# Patient Record
Sex: Male | Born: 1950 | Race: Black or African American | Hispanic: No | Marital: Single | Smoking: Former smoker
Health system: Southern US, Community
[De-identification: ages and names within clinical notes are randomized; demographics above are authoritative.]

## PROBLEM LIST (undated history)

## (undated) DIAGNOSIS — I480 Paroxysmal atrial fibrillation: Secondary | ICD-10-CM

## (undated) DIAGNOSIS — I2510 Atherosclerotic heart disease of native coronary artery without angina pectoris: Secondary | ICD-10-CM

## (undated) DIAGNOSIS — I739 Peripheral vascular disease, unspecified: Secondary | ICD-10-CM

## (undated) DIAGNOSIS — F0390 Unspecified dementia without behavioral disturbance: Secondary | ICD-10-CM

## (undated) DIAGNOSIS — R7303 Prediabetes: Secondary | ICD-10-CM

## (undated) DIAGNOSIS — T4145XA Adverse effect of unspecified anesthetic, initial encounter: Secondary | ICD-10-CM

## (undated) DIAGNOSIS — E785 Hyperlipidemia, unspecified: Secondary | ICD-10-CM

## (undated) DIAGNOSIS — I639 Cerebral infarction, unspecified: Secondary | ICD-10-CM

## (undated) DIAGNOSIS — I10 Essential (primary) hypertension: Secondary | ICD-10-CM

## (undated) DIAGNOSIS — F1020 Alcohol dependence, uncomplicated: Secondary | ICD-10-CM

## (undated) DIAGNOSIS — I509 Heart failure, unspecified: Secondary | ICD-10-CM

## (undated) DIAGNOSIS — Z89612 Acquired absence of left leg above knee: Secondary | ICD-10-CM

## (undated) DIAGNOSIS — Z9289 Personal history of other medical treatment: Secondary | ICD-10-CM

## (undated) DIAGNOSIS — I82409 Acute embolism and thrombosis of unspecified deep veins of unspecified lower extremity: Secondary | ICD-10-CM

## (undated) DIAGNOSIS — I6529 Occlusion and stenosis of unspecified carotid artery: Secondary | ICD-10-CM

## (undated) DIAGNOSIS — IMO0001 Reserved for inherently not codable concepts without codable children: Secondary | ICD-10-CM

## (undated) DIAGNOSIS — E7800 Pure hypercholesterolemia, unspecified: Secondary | ICD-10-CM

## (undated) DIAGNOSIS — H5440 Blindness, one eye, unspecified eye: Secondary | ICD-10-CM

## (undated) DIAGNOSIS — T8859XA Other complications of anesthesia, initial encounter: Secondary | ICD-10-CM

## (undated) DIAGNOSIS — Z89611 Acquired absence of right leg above knee: Secondary | ICD-10-CM

## (undated) DIAGNOSIS — I219 Acute myocardial infarction, unspecified: Secondary | ICD-10-CM

## (undated) DIAGNOSIS — E119 Type 2 diabetes mellitus without complications: Secondary | ICD-10-CM

## (undated) HISTORY — DX: Alcohol dependence, uncomplicated: F10.20

## (undated) HISTORY — DX: Hyperlipidemia, unspecified: E78.5

## (undated) HISTORY — DX: Paroxysmal atrial fibrillation: I48.0

## (undated) HISTORY — DX: Cerebral infarction, unspecified: I63.9

## (undated) HISTORY — DX: Unspecified dementia without behavioral disturbance: F03.90

## (undated) HISTORY — PX: OTHER SURGICAL HISTORY: SHX169

## (undated) HISTORY — DX: Essential (primary) hypertension: I10

## (undated) HISTORY — DX: Atherosclerotic heart disease of native coronary artery without angina pectoris: I25.10

## (undated) HISTORY — PX: CARDIAC CATHETERIZATION: SHX172

## (undated) HISTORY — PX: APPENDECTOMY: SHX54

## (undated) HISTORY — DX: Unspecified dementia, unspecified severity, without behavioral disturbance, psychotic disturbance, mood disturbance, and anxiety: F03.90

## (undated) HISTORY — DX: Occlusion and stenosis of unspecified carotid artery: I65.29

## (undated) HISTORY — DX: Acquired absence of right leg above knee: Z89.611

## (undated) HISTORY — PX: CORONARY ARTERY BYPASS GRAFT: SHX141

## (undated) HISTORY — PX: BACK SURGERY: SHX140

## (undated) HISTORY — DX: Acquired absence of left leg above knee: Z89.612

---
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
## 1999-05-05 HISTORY — PX: PERIPHERAL ARTERIAL STENT GRAFT: SHX2220

## 2004-11-27 ENCOUNTER — Ambulatory Visit: Payer: Self-pay | Admitting: Cardiology

## 2004-11-28 ENCOUNTER — Inpatient Hospital Stay (HOSPITAL_COMMUNITY): Admission: EM | Admit: 2004-11-28 | Discharge: 2004-11-30 | Payer: Self-pay | Admitting: Emergency Medicine

## 2005-02-03 ENCOUNTER — Emergency Department (HOSPITAL_COMMUNITY): Admission: AC | Admit: 2005-02-03 | Discharge: 2005-02-04 | Payer: Self-pay

## 2006-11-04 ENCOUNTER — Observation Stay (HOSPITAL_COMMUNITY): Admission: EM | Admit: 2006-11-04 | Discharge: 2006-11-06 | Payer: Self-pay | Admitting: Emergency Medicine

## 2006-11-05 ENCOUNTER — Ambulatory Visit: Payer: Self-pay | Admitting: Internal Medicine

## 2006-11-05 ENCOUNTER — Encounter: Payer: Self-pay | Admitting: Cardiovascular Disease

## 2006-11-05 ENCOUNTER — Ambulatory Visit: Payer: Self-pay | Admitting: Vascular Surgery

## 2008-01-27 ENCOUNTER — Emergency Department (HOSPITAL_COMMUNITY): Admission: EM | Admit: 2008-01-27 | Discharge: 2008-01-27 | Payer: Self-pay | Admitting: Emergency Medicine

## 2008-03-15 ENCOUNTER — Ambulatory Visit: Payer: Self-pay | Admitting: Vascular Surgery

## 2008-03-15 ENCOUNTER — Encounter (INDEPENDENT_AMBULATORY_CARE_PROVIDER_SITE_OTHER): Payer: Self-pay | Admitting: Emergency Medicine

## 2008-03-15 ENCOUNTER — Inpatient Hospital Stay (HOSPITAL_COMMUNITY): Admission: EM | Admit: 2008-03-15 | Discharge: 2008-03-17 | Payer: Self-pay | Admitting: Emergency Medicine

## 2008-03-26 DIAGNOSIS — I10 Essential (primary) hypertension: Secondary | ICD-10-CM | POA: Insufficient documentation

## 2008-03-26 DIAGNOSIS — N400 Enlarged prostate without lower urinary tract symptoms: Secondary | ICD-10-CM | POA: Insufficient documentation

## 2008-03-29 DIAGNOSIS — I739 Peripheral vascular disease, unspecified: Secondary | ICD-10-CM | POA: Insufficient documentation

## 2008-03-29 DIAGNOSIS — I2510 Atherosclerotic heart disease of native coronary artery without angina pectoris: Secondary | ICD-10-CM | POA: Insufficient documentation

## 2008-03-29 DIAGNOSIS — E785 Hyperlipidemia, unspecified: Secondary | ICD-10-CM

## 2008-03-29 DIAGNOSIS — I798 Other disorders of arteries, arterioles and capillaries in diseases classified elsewhere: Secondary | ICD-10-CM

## 2008-03-29 DIAGNOSIS — R079 Chest pain, unspecified: Secondary | ICD-10-CM | POA: Insufficient documentation

## 2008-05-19 ENCOUNTER — Ambulatory Visit: Payer: Self-pay | Admitting: Vascular Surgery

## 2008-08-06 ENCOUNTER — Emergency Department (HOSPITAL_COMMUNITY): Admission: EM | Admit: 2008-08-06 | Discharge: 2008-08-06 | Payer: Self-pay | Admitting: Emergency Medicine

## 2008-08-19 ENCOUNTER — Emergency Department (HOSPITAL_COMMUNITY): Admission: EM | Admit: 2008-08-19 | Discharge: 2008-08-19 | Payer: Self-pay | Admitting: Family Medicine

## 2008-09-08 ENCOUNTER — Ambulatory Visit: Payer: Self-pay | Admitting: Vascular Surgery

## 2008-09-10 ENCOUNTER — Ambulatory Visit (HOSPITAL_COMMUNITY): Admission: RE | Admit: 2008-09-10 | Discharge: 2008-09-10 | Payer: Self-pay | Admitting: Vascular Surgery

## 2008-09-10 ENCOUNTER — Ambulatory Visit: Payer: Self-pay | Admitting: Vascular Surgery

## 2008-09-14 ENCOUNTER — Inpatient Hospital Stay (HOSPITAL_COMMUNITY): Admission: AD | Admit: 2008-09-14 | Discharge: 2008-09-24 | Payer: Self-pay | Admitting: Vascular Surgery

## 2008-09-15 ENCOUNTER — Encounter: Payer: Self-pay | Admitting: Vascular Surgery

## 2008-09-17 ENCOUNTER — Encounter: Payer: Self-pay | Admitting: Vascular Surgery

## 2008-09-17 ENCOUNTER — Ambulatory Visit: Payer: Self-pay | Admitting: Vascular Surgery

## 2008-10-06 ENCOUNTER — Ambulatory Visit: Payer: Self-pay | Admitting: Vascular Surgery

## 2008-10-13 ENCOUNTER — Ambulatory Visit: Payer: Self-pay | Admitting: Vascular Surgery

## 2008-12-02 ENCOUNTER — Ambulatory Visit: Payer: Self-pay | Admitting: Vascular Surgery

## 2009-01-21 ENCOUNTER — Ambulatory Visit: Payer: Self-pay | Admitting: Vascular Surgery

## 2009-04-13 ENCOUNTER — Ambulatory Visit: Payer: Self-pay | Admitting: Vascular Surgery

## 2009-08-17 ENCOUNTER — Ambulatory Visit: Payer: Self-pay | Admitting: Cardiology

## 2009-08-17 ENCOUNTER — Encounter: Payer: Self-pay | Admitting: Emergency Medicine

## 2009-08-17 ENCOUNTER — Inpatient Hospital Stay (HOSPITAL_COMMUNITY): Admission: EM | Admit: 2009-08-17 | Discharge: 2009-08-31 | Payer: Self-pay | Admitting: Emergency Medicine

## 2009-08-18 ENCOUNTER — Ambulatory Visit: Payer: Self-pay | Admitting: Surgery

## 2009-08-18 ENCOUNTER — Encounter: Payer: Self-pay | Admitting: Thoracic Surgery (Cardiothoracic Vascular Surgery)

## 2009-08-18 ENCOUNTER — Encounter: Payer: Self-pay | Admitting: Cardiology

## 2009-09-27 ENCOUNTER — Ambulatory Visit: Payer: Self-pay | Admitting: Surgery

## 2009-09-27 ENCOUNTER — Encounter: Admission: RE | Admit: 2009-09-27 | Discharge: 2009-09-27 | Payer: Self-pay | Admitting: Surgery

## 2009-11-22 DIAGNOSIS — I2510 Atherosclerotic heart disease of native coronary artery without angina pectoris: Secondary | ICD-10-CM

## 2009-11-22 DIAGNOSIS — I10 Essential (primary) hypertension: Secondary | ICD-10-CM

## 2009-11-22 DIAGNOSIS — I739 Peripheral vascular disease, unspecified: Secondary | ICD-10-CM

## 2009-11-24 ENCOUNTER — Observation Stay (HOSPITAL_COMMUNITY): Admission: EM | Admit: 2009-11-24 | Discharge: 2009-11-29 | Payer: Self-pay | Admitting: Emergency Medicine

## 2009-11-24 ENCOUNTER — Ambulatory Visit: Payer: Self-pay | Admitting: Internal Medicine

## 2009-11-25 ENCOUNTER — Encounter: Payer: Self-pay | Admitting: Internal Medicine

## 2009-11-29 ENCOUNTER — Encounter: Payer: Self-pay | Admitting: Cardiology

## 2009-12-01 ENCOUNTER — Encounter (INDEPENDENT_AMBULATORY_CARE_PROVIDER_SITE_OTHER): Payer: Self-pay | Admitting: *Deleted

## 2010-09-24 ENCOUNTER — Encounter: Payer: Self-pay | Admitting: Surgery

## 2010-10-03 NOTE — Letter (Signed)
Summary: Appointment - Missed  [facility], [place]  [address] [place]   [place], [place] [address]   Phone: [phone]  Fax: [phone]     December 01, 2009 MRN: [id number]   [name] [place], [place]  [address]   Dear [name],  [facility] records indicate you missed your appointment on 11/30/2009 with Dr. Aundra Dubin. It is very important that we reach you to reschedule this appointment. We look forward to participating in your health care needs. Please contact us at the number listed above at your earliest convenience to reschedule this appointment.     Sincerely,   Darnell Level [facility] Scheduling Team

## 2010-10-03 NOTE — Miscellaneous (Signed)
  Clinical Lists Changes  Observations: Added new observation of RESULTS MISC:   MYOCARDIAL IMAGING WITH SPECT (REST AND PHARMACOLOGIC-STRESS)   GATED LEFT VENTRICULAR WALL MOTION STUDY   LEFT VENTRICULAR EJECTION FRACTION    Findings:    Spect:  There is a large fixed defect involving the lateral wall   extending into the anterior wall and inferior wall.  It extends   from the base to the apex.  No stress induced ischemia.    Wall motion:  Moderate hypokinesis of the inferior wall.   Relatively normal motion of the lateral wall despite the fixed   defect.    Ejection fraction:  46%.  End diastolic volume 76 ml.  End-systolic   volume 41 ml.    IMPRESSION:   Large fixed defect involving the lateral wall extending into the   anterior and inferior walls as described.    No stress induced ischemia.    Read By:  Marybelle Killings,  M.D.   Released By:  [name],  M.D. (11/25/2009 13:40) Added new observation of CXR RESULTS:    IMPRESSION:   Postop changes.  No acute chest finding    Read By:  Earl Gala.,  M.D.   Released By:  Earl Gala.,  M.D.  (11/24/2009 13:41)      MISC. Report  Procedure date:  11/25/2009  Findings:        MYOCARDIAL IMAGING WITH SPECT (REST AND PHARMACOLOGIC-STRESS)   GATED LEFT VENTRICULAR WALL MOTION STUDY   LEFT VENTRICULAR EJECTION FRACTION    Findings:    Spect:  There is a large fixed defect involving the lateral wall   extending into the anterior wall and inferior wall.  It extends   from the base to the apex.  No stress induced ischemia.    Wall motion:  Moderate hypokinesis of the inferior wall.   Relatively normal motion of the lateral wall despite the fixed   defect.    Ejection fraction:  46%.  End diastolic volume 76 ml.  End-systolic   volume 41 ml.    IMPRESSION:   Large fixed defect involving the lateral wall extending into the   anterior and inferior walls as described.    No stress induced  ischemia.    Read By:  Marybelle Killings,  M.D.   Released By:  [name],  M.D.  CXR  Procedure date:  11/24/2009  Findings:         IMPRESSION:   Postop changes.  No acute chest finding    Read By:  Earl Gala.,  M.D.   Released By:  Earl Gala.,  M.D.

## 2010-11-26 LAB — POCT CARDIAC MARKERS
CKMB, poc: 1.2 ng/mL (ref 1.0–8.0)
CKMB, poc: 1.3 ng/mL (ref 1.0–8.0)
Myoglobin, poc: 73.8 ng/mL (ref 12–200)
Myoglobin, poc: 89.8 ng/mL (ref 12–200)
Troponin i, poc: <0.05 ng/mL (ref 0.00–0.09)
Troponin i, poc: <0.05 ng/mL (ref 0.00–0.09)

## 2010-11-26 LAB — LIPID PANEL
Cholesterol: 158 mg/dL (ref 0–200)
HDL: 32 mg/dL — ABNORMAL LOW (ref >39)
LDL Cholesterol: 101 mg/dL — ABNORMAL HIGH (ref 0–99)
Total CHOL/HDL Ratio: 4.9 RATIO
Triglycerides: 125 mg/dL (ref <150)
VLDL: 25 mg/dL (ref 0–40)

## 2010-11-26 LAB — CARDIAC PANEL(CRET KIN+CKTOT+MB+TROPI)
CK, MB: 2.5 ng/mL (ref 0.3–4.0)
CK, MB: 2.6 ng/mL (ref 0.3–4.0)
CK, MB: 2.6 ng/mL (ref 0.3–4.0)
Relative Index: 1.8 (ref 0.0–2.5)
Relative Index: 1.9 (ref 0.0–2.5)
Relative Index: 1.9 (ref 0.0–2.5)
Total CK: 133 U/L (ref 7–232)
Total CK: 136 U/L (ref 7–232)
Total CK: 141 U/L (ref 7–232)
Troponin I: 0.02 ng/mL (ref 0.00–0.06)
Troponin I: 0.02 ng/mL (ref 0.00–0.06)
Troponin I: 0.05 ng/mL (ref 0.00–0.06)

## 2010-11-26 LAB — CBC
HCT: 40.6 % (ref 39.0–52.0)
HCT: 42.5 % (ref 39.0–52.0)
Hemoglobin: 13.3 g/dL (ref 13.0–17.0)
Hemoglobin: 13.8 g/dL (ref 13.0–17.0)
MCHC: 32.5 g/dL (ref 30.0–36.0)
MCHC: 32.8 g/dL (ref 30.0–36.0)
MCV: 81.7 fL (ref 78.0–100.0)
MCV: 82.3 fL (ref 78.0–100.0)
Platelets: 182 10*3/uL (ref 150–400)
Platelets: 193 10*3/uL (ref 150–400)
RBC: 4.97 MIL/uL (ref 4.22–5.81)
RBC: 5.16 MIL/uL (ref 4.22–5.81)
RDW: 15.2 % (ref 11.5–15.5)
RDW: 15.2 % (ref 11.5–15.5)
WBC: 6.2 10*3/uL (ref 4.0–10.5)
WBC: 6.2 10*3/uL (ref 4.0–10.5)

## 2010-11-26 LAB — URINALYSIS, ROUTINE W REFLEX MICROSCOPIC
Bilirubin Urine: NEGATIVE
Glucose, UA: NEGATIVE mg/dL
Hgb urine dipstick: NEGATIVE
Ketones, ur: NEGATIVE mg/dL
Nitrite: NEGATIVE
Protein, ur: NEGATIVE mg/dL
Specific Gravity, Urine: 1.024 (ref 1.005–1.030)
Urobilinogen, UA: 1 mg/dL (ref 0.0–1.0)
pH: 5 (ref 5.0–8.0)

## 2010-11-26 LAB — BASIC METABOLIC PANEL
BUN: 14 mg/dL (ref 6–23)
BUN: 14 mg/dL (ref 6–23)
BUN: 20 mg/dL (ref 6–23)
CO2: 25 mEq/L (ref 19–32)
CO2: 25 mEq/L (ref 19–32)
CO2: 27 mEq/L (ref 19–32)
Calcium: 8.7 mg/dL (ref 8.4–10.5)
Calcium: 8.9 mg/dL (ref 8.4–10.5)
Calcium: 9.1 mg/dL (ref 8.4–10.5)
Chloride: 107 mEq/L (ref 96–112)
Chloride: 109 mEq/L (ref 96–112)
Chloride: 110 mEq/L (ref 96–112)
Creatinine, Ser: 1.32 mg/dL (ref 0.4–1.5)
Creatinine, Ser: 1.41 mg/dL (ref 0.4–1.5)
Creatinine, Ser: 1.48 mg/dL (ref 0.4–1.5)
GFR calc Af Amer: 59 mL/min — ABNORMAL LOW (ref >60)
GFR calc Af Amer: >60 mL/min (ref >60)
GFR calc Af Amer: >60 mL/min (ref >60)
GFR calc non Af Amer: 49 mL/min — ABNORMAL LOW (ref >60)
GFR calc non Af Amer: 52 mL/min — ABNORMAL LOW (ref >60)
GFR calc non Af Amer: 56 mL/min — ABNORMAL LOW (ref >60)
Glucose, Bld: 89 mg/dL (ref 70–99)
Glucose, Bld: 97 mg/dL (ref 70–99)
Glucose, Bld: 98 mg/dL (ref 70–99)
Potassium: 3.9 mEq/L (ref 3.5–5.1)
Potassium: 4.2 mEq/L (ref 3.5–5.1)
Potassium: 4.3 mEq/L (ref 3.5–5.1)
Sodium: 138 mEq/L (ref 135–145)
Sodium: 139 mEq/L (ref 135–145)
Sodium: 140 mEq/L (ref 135–145)

## 2010-11-26 LAB — DIFFERENTIAL
Basophils Absolute: 0 10*3/uL (ref 0.0–0.1)
Basophils Absolute: 0 10*3/uL (ref 0.0–0.1)
Basophils Relative: 0 % (ref 0–1)
Basophils Relative: 0 % (ref 0–1)
Eosinophils Absolute: 0.1 10*3/uL (ref 0.0–0.7)
Eosinophils Absolute: 0.1 10*3/uL (ref 0.0–0.7)
Eosinophils Relative: 2 % (ref 0–5)
Eosinophils Relative: 2 % (ref 0–5)
Lymphocytes Relative: 50 % — ABNORMAL HIGH (ref 12–46)
Lymphocytes Relative: 52 % — ABNORMAL HIGH (ref 12–46)
Lymphs Abs: 3.1 10*3/uL (ref 0.7–4.0)
Lymphs Abs: 3.2 10*3/uL (ref 0.7–4.0)
Monocytes Absolute: 0.6 10*3/uL (ref 0.1–1.0)
Monocytes Absolute: 0.7 10*3/uL (ref 0.1–1.0)
Monocytes Relative: 10 % (ref 3–12)
Monocytes Relative: 11 % (ref 3–12)
Neutro Abs: 2.2 10*3/uL (ref 1.7–7.7)
Neutro Abs: 2.3 10*3/uL (ref 1.7–7.7)
Neutrophils Relative %: 35 % — ABNORMAL LOW (ref 43–77)
Neutrophils Relative %: 37 % — ABNORMAL LOW (ref 43–77)

## 2010-11-26 LAB — CK TOTAL AND CKMB (NOT AT ARMC)
CK, MB: 2.5 ng/mL (ref 0.3–4.0)
CK, MB: 2.6 ng/mL (ref 0.3–4.0)
Relative Index: 1.7 (ref 0.0–2.5)
Relative Index: 1.8 (ref 0.0–2.5)
Total CK: 137 U/L (ref 7–232)
Total CK: 150 U/L (ref 7–232)

## 2010-11-26 LAB — COMPREHENSIVE METABOLIC PANEL
ALT: 30 U/L (ref 0–53)
AST: 29 U/L (ref 0–37)
Albumin: 3.2 g/dL — ABNORMAL LOW (ref 3.5–5.2)
Alkaline Phosphatase: 65 U/L (ref 39–117)
BUN: 19 mg/dL (ref 6–23)
CO2: 27 mEq/L (ref 19–32)
Calcium: 9.2 mg/dL (ref 8.4–10.5)
Chloride: 107 mEq/L (ref 96–112)
Creatinine, Ser: 1.53 mg/dL — ABNORMAL HIGH (ref 0.4–1.5)
GFR calc Af Amer: 57 mL/min — ABNORMAL LOW (ref >60)
GFR calc non Af Amer: 47 mL/min — ABNORMAL LOW (ref >60)
Glucose, Bld: 96 mg/dL (ref 70–99)
Potassium: 4.3 mEq/L (ref 3.5–5.1)
Sodium: 139 mEq/L (ref 135–145)
Total Bilirubin: 0.3 mg/dL (ref 0.3–1.2)
Total Protein: 6.5 g/dL (ref 6.0–8.3)

## 2010-11-26 LAB — RAPID URINE DRUG SCREEN, HOSP PERFORMED
Amphetamines: NOT DETECTED
Barbiturates: NOT DETECTED
Benzodiazepines: NOT DETECTED
Cocaine: NOT DETECTED
Opiates: NOT DETECTED
Tetrahydrocannabinol: NOT DETECTED

## 2010-11-26 LAB — TROPONIN I
Troponin I: 0.02 ng/mL (ref 0.00–0.06)
Troponin I: 0.02 ng/mL (ref 0.00–0.06)

## 2010-11-26 LAB — PROTIME-INR
INR: 0.91 (ref 0.00–1.49)
Prothrombin Time: 12.2 seconds (ref 11.6–15.2)

## 2010-11-26 LAB — TSH: TSH: 3.446 u[IU]/mL (ref 0.350–4.500)

## 2010-11-26 LAB — HEMOGLOBIN A1C
Hgb A1c MFr Bld: 6.2 % — ABNORMAL HIGH (ref 4.6–6.1)
Mean Plasma Glucose: 131 mg/dL

## 2010-11-26 LAB — PHOSPHORUS: Phosphorus: 4.7 mg/dL — ABNORMAL HIGH (ref 2.3–4.6)

## 2010-11-26 LAB — MAGNESIUM: Magnesium: 1.9 mg/dL (ref 1.5–2.5)

## 2010-11-26 LAB — APTT: aPTT: 30 seconds (ref 24–37)

## 2010-12-04 LAB — GLUCOSE, CAPILLARY
Glucose-Capillary: 104 mg/dL — ABNORMAL HIGH (ref 70–99)
Glucose-Capillary: 104 mg/dL — ABNORMAL HIGH (ref 70–99)
Glucose-Capillary: 108 mg/dL — ABNORMAL HIGH (ref 70–99)
Glucose-Capillary: 112 mg/dL — ABNORMAL HIGH (ref 70–99)
Glucose-Capillary: 118 mg/dL — ABNORMAL HIGH (ref 70–99)
Glucose-Capillary: 120 mg/dL — ABNORMAL HIGH (ref 70–99)
Glucose-Capillary: 124 mg/dL — ABNORMAL HIGH (ref 70–99)
Glucose-Capillary: 130 mg/dL — ABNORMAL HIGH (ref 70–99)
Glucose-Capillary: 131 mg/dL — ABNORMAL HIGH (ref 70–99)
Glucose-Capillary: 93 mg/dL (ref 70–99)
Glucose-Capillary: 99 mg/dL (ref 70–99)

## 2010-12-04 LAB — BASIC METABOLIC PANEL
BUN: 10 mg/dL (ref 6–23)
BUN: 10 mg/dL (ref 6–23)
BUN: 13 mg/dL (ref 6–23)
BUN: 14 mg/dL (ref 6–23)
BUN: 14 mg/dL (ref 6–23)
BUN: 14 mg/dL (ref 6–23)
BUN: 14 mg/dL (ref 6–23)
BUN: 17 mg/dL (ref 6–23)
BUN: 6 mg/dL (ref 6–23)
BUN: 8 mg/dL (ref 6–23)
CO2: 20 mEq/L (ref 19–32)
CO2: 21 mEq/L (ref 19–32)
CO2: 22 mEq/L (ref 19–32)
CO2: 22 mEq/L (ref 19–32)
CO2: 22 mEq/L (ref 19–32)
CO2: 22 mEq/L (ref 19–32)
CO2: 23 mEq/L (ref 19–32)
CO2: 23 mEq/L (ref 19–32)
CO2: 24 mEq/L (ref 19–32)
CO2: 26 mEq/L (ref 19–32)
Calcium: 6.9 mg/dL — ABNORMAL LOW (ref 8.4–10.5)
Calcium: 8 mg/dL — ABNORMAL LOW (ref 8.4–10.5)
Calcium: 8 mg/dL — ABNORMAL LOW (ref 8.4–10.5)
Calcium: 8.1 mg/dL — ABNORMAL LOW (ref 8.4–10.5)
Calcium: 8.2 mg/dL — ABNORMAL LOW (ref 8.4–10.5)
Calcium: 8.4 mg/dL (ref 8.4–10.5)
Calcium: 8.5 mg/dL (ref 8.4–10.5)
Calcium: 8.5 mg/dL (ref 8.4–10.5)
Calcium: 8.7 mg/dL (ref 8.4–10.5)
Calcium: 8.7 mg/dL (ref 8.4–10.5)
Chloride: 109 mEq/L (ref 96–112)
Chloride: 109 mEq/L (ref 96–112)
Chloride: 109 mEq/L (ref 96–112)
Chloride: 109 mEq/L (ref 96–112)
Chloride: 110 mEq/L (ref 96–112)
Chloride: 110 mEq/L (ref 96–112)
Chloride: 110 mEq/L (ref 96–112)
Chloride: 111 mEq/L (ref 96–112)
Chloride: 114 mEq/L — ABNORMAL HIGH (ref 96–112)
Chloride: 117 mEq/L — ABNORMAL HIGH (ref 96–112)
Creatinine, Ser: 0.87 mg/dL (ref 0.4–1.5)
Creatinine, Ser: 1.05 mg/dL (ref 0.4–1.5)
Creatinine, Ser: 1.08 mg/dL (ref 0.4–1.5)
Creatinine, Ser: 1.1 mg/dL (ref 0.4–1.5)
Creatinine, Ser: 1.1 mg/dL (ref 0.4–1.5)
Creatinine, Ser: 1.15 mg/dL (ref 0.4–1.5)
Creatinine, Ser: 1.23 mg/dL (ref 0.4–1.5)
Creatinine, Ser: 1.24 mg/dL (ref 0.4–1.5)
Creatinine, Ser: 1.27 mg/dL (ref 0.4–1.5)
Creatinine, Ser: 1.33 mg/dL (ref 0.4–1.5)
GFR calc Af Amer: >60 mL/min (ref >60)
GFR calc Af Amer: >60 mL/min (ref >60)
GFR calc Af Amer: >60 mL/min (ref >60)
GFR calc Af Amer: >60 mL/min (ref >60)
GFR calc Af Amer: >60 mL/min (ref >60)
GFR calc Af Amer: >60 mL/min (ref >60)
GFR calc Af Amer: >60 mL/min (ref >60)
GFR calc Af Amer: >60 mL/min (ref >60)
GFR calc Af Amer: >60 mL/min (ref >60)
GFR calc Af Amer: >60 mL/min (ref >60)
GFR calc non Af Amer: 55 mL/min — ABNORMAL LOW (ref >60)
GFR calc non Af Amer: 58 mL/min — ABNORMAL LOW (ref >60)
GFR calc non Af Amer: >60 mL/min (ref >60)
GFR calc non Af Amer: >60 mL/min (ref >60)
GFR calc non Af Amer: >60 mL/min (ref >60)
GFR calc non Af Amer: >60 mL/min (ref >60)
GFR calc non Af Amer: >60 mL/min (ref >60)
GFR calc non Af Amer: >60 mL/min (ref >60)
GFR calc non Af Amer: >60 mL/min (ref >60)
GFR calc non Af Amer: >60 mL/min (ref >60)
Glucose, Bld: 105 mg/dL — ABNORMAL HIGH (ref 70–99)
Glucose, Bld: 114 mg/dL — ABNORMAL HIGH (ref 70–99)
Glucose, Bld: 116 mg/dL — ABNORMAL HIGH (ref 70–99)
Glucose, Bld: 117 mg/dL — ABNORMAL HIGH (ref 70–99)
Glucose, Bld: 120 mg/dL — ABNORMAL HIGH (ref 70–99)
Glucose, Bld: 124 mg/dL — ABNORMAL HIGH (ref 70–99)
Glucose, Bld: 128 mg/dL — ABNORMAL HIGH (ref 70–99)
Glucose, Bld: 128 mg/dL — ABNORMAL HIGH (ref 70–99)
Glucose, Bld: 89 mg/dL (ref 70–99)
Glucose, Bld: 99 mg/dL (ref 70–99)
Potassium: 3.3 mEq/L — ABNORMAL LOW (ref 3.5–5.1)
Potassium: 3.6 mEq/L (ref 3.5–5.1)
Potassium: 3.6 mEq/L (ref 3.5–5.1)
Potassium: 3.7 mEq/L (ref 3.5–5.1)
Potassium: 3.7 mEq/L (ref 3.5–5.1)
Potassium: 3.7 mEq/L (ref 3.5–5.1)
Potassium: 3.8 mEq/L (ref 3.5–5.1)
Potassium: 3.8 mEq/L (ref 3.5–5.1)
Potassium: 3.9 mEq/L (ref 3.5–5.1)
Potassium: 4 mEq/L (ref 3.5–5.1)
Sodium: 136 mEq/L (ref 135–145)
Sodium: 137 mEq/L (ref 135–145)
Sodium: 138 mEq/L (ref 135–145)
Sodium: 138 mEq/L (ref 135–145)
Sodium: 139 mEq/L (ref 135–145)
Sodium: 140 mEq/L (ref 135–145)
Sodium: 142 mEq/L (ref 135–145)
Sodium: 142 mEq/L (ref 135–145)
Sodium: 142 mEq/L (ref 135–145)
Sodium: 143 mEq/L (ref 135–145)

## 2010-12-04 LAB — CBC
HCT: 25.5 % — ABNORMAL LOW (ref 39.0–52.0)
HCT: 26.1 % — ABNORMAL LOW (ref 39.0–52.0)
HCT: 26.6 % — ABNORMAL LOW (ref 39.0–52.0)
HCT: 26.9 % — ABNORMAL LOW (ref 39.0–52.0)
HCT: 27.4 % — ABNORMAL LOW (ref 39.0–52.0)
HCT: 28.2 % — ABNORMAL LOW (ref 39.0–52.0)
HCT: 29.1 % — ABNORMAL LOW (ref 39.0–52.0)
HCT: 29.3 % — ABNORMAL LOW (ref 39.0–52.0)
HCT: 32.1 % — ABNORMAL LOW (ref 39.0–52.0)
HCT: 34.9 % — ABNORMAL LOW (ref 39.0–52.0)
HCT: 36.8 % — ABNORMAL LOW (ref 39.0–52.0)
HCT: 40.2 % (ref 39.0–52.0)
HCT: 40.9 % (ref 39.0–52.0)
HCT: 41.5 % (ref 39.0–52.0)
HCT: 42.5 % (ref 39.0–52.0)
HCT: 44.3 % (ref 39.0–52.0)
Hemoglobin: 10.6 g/dL — ABNORMAL LOW (ref 13.0–17.0)
Hemoglobin: 11.3 g/dL — ABNORMAL LOW (ref 13.0–17.0)
Hemoglobin: 11.9 g/dL — ABNORMAL LOW (ref 13.0–17.0)
Hemoglobin: 13.3 g/dL (ref 13.0–17.0)
Hemoglobin: 13.5 g/dL (ref 13.0–17.0)
Hemoglobin: 13.6 g/dL (ref 13.0–17.0)
Hemoglobin: 14 g/dL (ref 13.0–17.0)
Hemoglobin: 14.6 g/dL (ref 13.0–17.0)
Hemoglobin: 8.4 g/dL — ABNORMAL LOW (ref 13.0–17.0)
Hemoglobin: 8.6 g/dL — ABNORMAL LOW (ref 13.0–17.0)
Hemoglobin: 8.8 g/dL — ABNORMAL LOW (ref 13.0–17.0)
Hemoglobin: 8.9 g/dL — ABNORMAL LOW (ref 13.0–17.0)
Hemoglobin: 8.9 g/dL — ABNORMAL LOW (ref 13.0–17.0)
Hemoglobin: 9.4 g/dL — ABNORMAL LOW (ref 13.0–17.0)
Hemoglobin: 9.6 g/dL — ABNORMAL LOW (ref 13.0–17.0)
Hemoglobin: 9.6 g/dL — ABNORMAL LOW (ref 13.0–17.0)
MCHC: 32.3 g/dL (ref 30.0–36.0)
MCHC: 32.3 g/dL (ref 30.0–36.0)
MCHC: 32.6 g/dL (ref 30.0–36.0)
MCHC: 32.7 g/dL (ref 30.0–36.0)
MCHC: 32.7 g/dL (ref 30.0–36.0)
MCHC: 32.8 g/dL (ref 30.0–36.0)
MCHC: 32.8 g/dL (ref 30.0–36.0)
MCHC: 32.8 g/dL (ref 30.0–36.0)
MCHC: 33 g/dL (ref 30.0–36.0)
MCHC: 33 g/dL (ref 30.0–36.0)
MCHC: 33.1 g/dL (ref 30.0–36.0)
MCHC: 33.1 g/dL (ref 30.0–36.0)
MCHC: 33.1 g/dL (ref 30.0–36.0)
MCHC: 33.1 g/dL (ref 30.0–36.0)
MCHC: 33.2 g/dL (ref 30.0–36.0)
MCHC: 33.3 g/dL (ref 30.0–36.0)
MCV: 84.3 fL (ref 78.0–100.0)
MCV: 84.4 fL (ref 78.0–100.0)
MCV: 84.6 fL (ref 78.0–100.0)
MCV: 84.7 fL (ref 78.0–100.0)
MCV: 84.8 fL (ref 78.0–100.0)
MCV: 84.8 fL (ref 78.0–100.0)
MCV: 84.9 fL (ref 78.0–100.0)
MCV: 84.9 fL (ref 78.0–100.0)
MCV: 85 fL (ref 78.0–100.0)
MCV: 85.2 fL (ref 78.0–100.0)
MCV: 85.2 fL (ref 78.0–100.0)
MCV: 85.3 fL (ref 78.0–100.0)
MCV: 85.4 fL (ref 78.0–100.0)
MCV: 85.4 fL (ref 78.0–100.0)
MCV: 85.5 fL (ref 78.0–100.0)
MCV: 85.9 fL (ref 78.0–100.0)
Platelets: 123 10*3/uL — ABNORMAL LOW (ref 150–400)
Platelets: 140 10*3/uL — ABNORMAL LOW (ref 150–400)
Platelets: 145 10*3/uL — ABNORMAL LOW (ref 150–400)
Platelets: 147 10*3/uL — ABNORMAL LOW (ref 150–400)
Platelets: 152 10*3/uL (ref 150–400)
Platelets: 166 10*3/uL (ref 150–400)
Platelets: 173 10*3/uL (ref 150–400)
Platelets: 178 10*3/uL (ref 150–400)
Platelets: 187 10*3/uL (ref 150–400)
Platelets: 190 10*3/uL (ref 150–400)
Platelets: 192 10*3/uL (ref 150–400)
Platelets: 196 10*3/uL (ref 150–400)
Platelets: 241 10*3/uL (ref 150–400)
Platelets: 259 10*3/uL (ref 150–400)
Platelets: 312 10*3/uL (ref 150–400)
Platelets: 395 10*3/uL (ref 150–400)
RBC: 2.99 MIL/uL — ABNORMAL LOW (ref 4.22–5.81)
RBC: 3.06 MIL/uL — ABNORMAL LOW (ref 4.22–5.81)
RBC: 3.13 MIL/uL — ABNORMAL LOW (ref 4.22–5.81)
RBC: 3.18 MIL/uL — ABNORMAL LOW (ref 4.22–5.81)
RBC: 3.19 MIL/uL — ABNORMAL LOW (ref 4.22–5.81)
RBC: 3.33 MIL/uL — ABNORMAL LOW (ref 4.22–5.81)
RBC: 3.42 MIL/uL — ABNORMAL LOW (ref 4.22–5.81)
RBC: 3.45 MIL/uL — ABNORMAL LOW (ref 4.22–5.81)
RBC: 3.76 MIL/uL — ABNORMAL LOW (ref 4.22–5.81)
RBC: 4.08 MIL/uL — ABNORMAL LOW (ref 4.22–5.81)
RBC: 4.35 MIL/uL (ref 4.22–5.81)
RBC: 4.75 MIL/uL (ref 4.22–5.81)
RBC: 4.85 MIL/uL (ref 4.22–5.81)
RBC: 4.9 MIL/uL (ref 4.22–5.81)
RBC: 5.01 MIL/uL (ref 4.22–5.81)
RBC: 5.22 MIL/uL (ref 4.22–5.81)
RDW: 15.3 % (ref 11.5–15.5)
RDW: 15.6 % — ABNORMAL HIGH (ref 11.5–15.5)
RDW: 15.6 % — ABNORMAL HIGH (ref 11.5–15.5)
RDW: 15.7 % — ABNORMAL HIGH (ref 11.5–15.5)
RDW: 15.7 % — ABNORMAL HIGH (ref 11.5–15.5)
RDW: 15.8 % — ABNORMAL HIGH (ref 11.5–15.5)
RDW: 15.8 % — ABNORMAL HIGH (ref 11.5–15.5)
RDW: 15.9 % — ABNORMAL HIGH (ref 11.5–15.5)
RDW: 15.9 % — ABNORMAL HIGH (ref 11.5–15.5)
RDW: 15.9 % — ABNORMAL HIGH (ref 11.5–15.5)
RDW: 15.9 % — ABNORMAL HIGH (ref 11.5–15.5)
RDW: 16 % — ABNORMAL HIGH (ref 11.5–15.5)
RDW: 16 % — ABNORMAL HIGH (ref 11.5–15.5)
RDW: 16.1 % — ABNORMAL HIGH (ref 11.5–15.5)
RDW: 16.3 % — ABNORMAL HIGH (ref 11.5–15.5)
RDW: 16.4 % — ABNORMAL HIGH (ref 11.5–15.5)
WBC: 10.3 10*3/uL (ref 4.0–10.5)
WBC: 10.5 10*3/uL (ref 4.0–10.5)
WBC: 11.1 10*3/uL — ABNORMAL HIGH (ref 4.0–10.5)
WBC: 11.2 10*3/uL — ABNORMAL HIGH (ref 4.0–10.5)
WBC: 11.3 10*3/uL — ABNORMAL HIGH (ref 4.0–10.5)
WBC: 11.4 10*3/uL — ABNORMAL HIGH (ref 4.0–10.5)
WBC: 11.8 10*3/uL — ABNORMAL HIGH (ref 4.0–10.5)
WBC: 13.6 10*3/uL — ABNORMAL HIGH (ref 4.0–10.5)
WBC: 6.5 10*3/uL (ref 4.0–10.5)
WBC: 6.9 10*3/uL (ref 4.0–10.5)
WBC: 7.2 10*3/uL (ref 4.0–10.5)
WBC: 7.4 10*3/uL (ref 4.0–10.5)
WBC: 8.2 10*3/uL (ref 4.0–10.5)
WBC: 9 10*3/uL (ref 4.0–10.5)
WBC: 9.8 10*3/uL (ref 4.0–10.5)
WBC: 9.9 10*3/uL (ref 4.0–10.5)

## 2010-12-04 LAB — POCT I-STAT 3, ART BLOOD GAS (G3+)
Acid-base deficit: 1 mmol/L (ref 0.0–2.0)
Acid-base deficit: 1 mmol/L (ref 0.0–2.0)
Acid-base deficit: 2 mmol/L (ref 0.0–2.0)
Acid-base deficit: 3 mmol/L — ABNORMAL HIGH (ref 0.0–2.0)
Acid-base deficit: 5 mmol/L — ABNORMAL HIGH (ref 0.0–2.0)
Bicarbonate: 19.8 mEq/L — ABNORMAL LOW (ref 20.0–24.0)
Bicarbonate: 22.6 mEq/L (ref 20.0–24.0)
Bicarbonate: 23 mEq/L (ref 20.0–24.0)
Bicarbonate: 24.1 mEq/L — ABNORMAL HIGH (ref 20.0–24.0)
Bicarbonate: 24.4 mEq/L — ABNORMAL HIGH (ref 20.0–24.0)
O2 Saturation: 100 %
O2 Saturation: 100 %
O2 Saturation: 100 %
O2 Saturation: 95 %
O2 Saturation: 98 %
Patient temperature: 35.9
Patient temperature: 37.7
TCO2: 21 mmol/L (ref 0–100)
TCO2: 24 mmol/L (ref 0–100)
TCO2: 24 mmol/L (ref 0–100)
TCO2: 25 mmol/L (ref 0–100)
TCO2: 26 mmol/L (ref 0–100)
pCO2 arterial: 34.1 mmHg — ABNORMAL LOW (ref 35.0–45.0)
pCO2 arterial: 37.3 mmHg (ref 35.0–45.0)
pCO2 arterial: 39.6 mmHg (ref 35.0–45.0)
pCO2 arterial: 41.2 mmHg (ref 35.0–45.0)
pCO2 arterial: 42.7 mmHg (ref 35.0–45.0)
pH, Arterial: 7.346 — ABNORMAL LOW (ref 7.350–7.450)
pH, Arterial: 7.365 (ref 7.350–7.450)
pH, Arterial: 7.376 (ref 7.350–7.450)
pH, Arterial: 7.391 (ref 7.350–7.450)
pH, Arterial: 7.392 (ref 7.350–7.450)
pO2, Arterial: 101 mmHg — ABNORMAL HIGH (ref 80.0–100.0)
pO2, Arterial: 236 mmHg — ABNORMAL HIGH (ref 80.0–100.0)
pO2, Arterial: 307 mmHg — ABNORMAL HIGH (ref 80.0–100.0)
pO2, Arterial: 391 mmHg — ABNORMAL HIGH (ref 80.0–100.0)
pO2, Arterial: 71 mmHg — ABNORMAL LOW (ref 80.0–100.0)

## 2010-12-04 LAB — URINALYSIS, ROUTINE W REFLEX MICROSCOPIC
Bilirubin Urine: NEGATIVE
Glucose, UA: NEGATIVE mg/dL
Hgb urine dipstick: NEGATIVE
Ketones, ur: NEGATIVE mg/dL
Nitrite: NEGATIVE
Protein, ur: NEGATIVE mg/dL
Specific Gravity, Urine: 1.022 (ref 1.005–1.030)
Urobilinogen, UA: 4 mg/dL — ABNORMAL HIGH (ref 0.0–1.0)
pH: 8 (ref 5.0–8.0)

## 2010-12-04 LAB — CARDIAC PANEL(CRET KIN+CKTOT+MB+TROPI)
CK, MB: 341.8 ng/mL — ABNORMAL HIGH (ref 0.3–4.0)
CK, MB: 357.6 ng/mL — ABNORMAL HIGH (ref 0.3–4.0)
Relative Index: 6.1 — ABNORMAL HIGH (ref 0.0–2.5)
Relative Index: 7.4 — ABNORMAL HIGH (ref 0.0–2.5)
Total CK: 4637 U/L — ABNORMAL HIGH (ref 7–232)
Total CK: 5865 U/L — ABNORMAL HIGH (ref 7–232)
Troponin I: >100 ng/mL (ref 0.00–0.06)
Troponin I: >100 ng/mL (ref 0.00–0.06)

## 2010-12-04 LAB — CREATININE, SERUM
Creatinine, Ser: 1.28 mg/dL (ref 0.4–1.5)
Creatinine, Ser: 1.3 mg/dL (ref 0.4–1.5)
GFR calc Af Amer: >60 mL/min (ref >60)
GFR calc Af Amer: >60 mL/min (ref >60)
GFR calc non Af Amer: 57 mL/min — ABNORMAL LOW (ref >60)
GFR calc non Af Amer: 58 mL/min — ABNORMAL LOW (ref >60)

## 2010-12-04 LAB — LIPID PANEL
Cholesterol: 128 mg/dL (ref 0–200)
HDL: 33 mg/dL — ABNORMAL LOW (ref >39)
LDL Cholesterol: 73 mg/dL (ref 0–99)
Total CHOL/HDL Ratio: 3.9 RATIO
Triglycerides: 110 mg/dL (ref <150)
VLDL: 22 mg/dL (ref 0–40)

## 2010-12-04 LAB — POCT I-STAT, CHEM 8
BUN: 11 mg/dL (ref 6–23)
BUN: 14 mg/dL (ref 6–23)
Calcium, Ion: 1.1 mmol/L — ABNORMAL LOW (ref 1.12–1.32)
Calcium, Ion: 1.17 mmol/L (ref 1.12–1.32)
Chloride: 108 mEq/L (ref 96–112)
Chloride: 109 mEq/L (ref 96–112)
Creatinine, Ser: 1.3 mg/dL (ref 0.4–1.5)
Creatinine, Ser: 1.3 mg/dL (ref 0.4–1.5)
Glucose, Bld: 127 mg/dL — ABNORMAL HIGH (ref 70–99)
Glucose, Bld: 164 mg/dL — ABNORMAL HIGH (ref 70–99)
HCT: 31 % — ABNORMAL LOW (ref 39.0–52.0)
HCT: 32 % — ABNORMAL LOW (ref 39.0–52.0)
Hemoglobin: 10.5 g/dL — ABNORMAL LOW (ref 13.0–17.0)
Hemoglobin: 10.9 g/dL — ABNORMAL LOW (ref 13.0–17.0)
Potassium: 4 mEq/L (ref 3.5–5.1)
Potassium: 4.4 mEq/L (ref 3.5–5.1)
Sodium: 141 mEq/L (ref 135–145)
Sodium: 141 mEq/L (ref 135–145)
TCO2: 21 mmol/L (ref 0–100)
TCO2: 22 mmol/L (ref 0–100)

## 2010-12-04 LAB — POCT I-STAT 4, (NA,K, GLUC, HGB,HCT)
Glucose, Bld: 102 mg/dL — ABNORMAL HIGH (ref 70–99)
Glucose, Bld: 104 mg/dL — ABNORMAL HIGH (ref 70–99)
Glucose, Bld: 107 mg/dL — ABNORMAL HIGH (ref 70–99)
Glucose, Bld: 121 mg/dL — ABNORMAL HIGH (ref 70–99)
Glucose, Bld: 132 mg/dL — ABNORMAL HIGH (ref 70–99)
Glucose, Bld: 87 mg/dL (ref 70–99)
Glucose, Bld: 91 mg/dL (ref 70–99)
Glucose, Bld: 99 mg/dL (ref 70–99)
HCT: 23 % — ABNORMAL LOW (ref 39.0–52.0)
HCT: 23 % — ABNORMAL LOW (ref 39.0–52.0)
HCT: 25 % — ABNORMAL LOW (ref 39.0–52.0)
HCT: 26 % — ABNORMAL LOW (ref 39.0–52.0)
HCT: 27 % — ABNORMAL LOW (ref 39.0–52.0)
HCT: 33 % — ABNORMAL LOW (ref 39.0–52.0)
HCT: 35 % — ABNORMAL LOW (ref 39.0–52.0)
HCT: 36 % — ABNORMAL LOW (ref 39.0–52.0)
Hemoglobin: 11.2 g/dL — ABNORMAL LOW (ref 13.0–17.0)
Hemoglobin: 11.9 g/dL — ABNORMAL LOW (ref 13.0–17.0)
Hemoglobin: 12.2 g/dL — ABNORMAL LOW (ref 13.0–17.0)
Hemoglobin: 7.8 g/dL — ABNORMAL LOW (ref 13.0–17.0)
Hemoglobin: 7.8 g/dL — ABNORMAL LOW (ref 13.0–17.0)
Hemoglobin: 8.5 g/dL — ABNORMAL LOW (ref 13.0–17.0)
Hemoglobin: 8.8 g/dL — ABNORMAL LOW (ref 13.0–17.0)
Hemoglobin: 9.2 g/dL — ABNORMAL LOW (ref 13.0–17.0)
Potassium: 3.6 mEq/L (ref 3.5–5.1)
Potassium: 3.9 mEq/L (ref 3.5–5.1)
Potassium: 4.4 mEq/L (ref 3.5–5.1)
Potassium: 4.5 mEq/L (ref 3.5–5.1)
Potassium: 4.6 mEq/L (ref 3.5–5.1)
Potassium: 5.8 mEq/L — ABNORMAL HIGH (ref 3.5–5.1)
Potassium: 6.5 mEq/L (ref 3.5–5.1)
Potassium: 6.5 mEq/L (ref 3.5–5.1)
Sodium: 130 mEq/L — ABNORMAL LOW (ref 135–145)
Sodium: 131 mEq/L — ABNORMAL LOW (ref 135–145)
Sodium: 132 mEq/L — ABNORMAL LOW (ref 135–145)
Sodium: 132 mEq/L — ABNORMAL LOW (ref 135–145)
Sodium: 134 mEq/L — ABNORMAL LOW (ref 135–145)
Sodium: 137 mEq/L (ref 135–145)
Sodium: 141 mEq/L (ref 135–145)
Sodium: 143 mEq/L (ref 135–145)

## 2010-12-04 LAB — URINE CULTURE
Colony Count: NO GROWTH
Culture: NO GROWTH

## 2010-12-04 LAB — BLOOD GAS, ARTERIAL
Acid-base deficit: 1.9 mmol/L (ref 0.0–2.0)
Bicarbonate: 21.8 mEq/L (ref 20.0–24.0)
FIO2: 0.21 %
O2 Saturation: 94.6 %
Patient temperature: 98.8
TCO2: 22.8 mmol/L (ref 0–100)
pCO2 arterial: 33.7 mmHg — ABNORMAL LOW (ref 35.0–45.0)
pH, Arterial: 7.427 (ref 7.350–7.450)
pO2, Arterial: 70 mmHg — ABNORMAL LOW (ref 80.0–100.0)

## 2010-12-04 LAB — CARBOXYHEMOGLOBIN
Carboxyhemoglobin: 1.4 % (ref 0.5–1.5)
Methemoglobin: 1 % (ref 0.0–1.5)
O2 Saturation: 47.5 %
Total hemoglobin: 11.7 g/dL — ABNORMAL LOW (ref 13.5–18.0)

## 2010-12-04 LAB — CROSSMATCH
ABO/RH(D): A POS
Antibody Screen: NEGATIVE

## 2010-12-04 LAB — POCT I-STAT 3, VENOUS BLOOD GAS (G3P V)
Acid-base deficit: 3 mmol/L — ABNORMAL HIGH (ref 0.0–2.0)
Bicarbonate: 22.5 mEq/L (ref 20.0–24.0)
O2 Saturation: 86 %
TCO2: 24 mmol/L (ref 0–100)
pCO2, Ven: 42.9 mmHg — ABNORMAL LOW (ref 45.0–50.0)
pH, Ven: 7.328 — ABNORMAL HIGH (ref 7.250–7.300)
pO2, Ven: 55 mmHg — ABNORMAL HIGH (ref 30.0–45.0)

## 2010-12-04 LAB — HEPARIN LEVEL (UNFRACTIONATED)
Heparin Unfractionated: 0.17 IU/mL — ABNORMAL LOW (ref 0.30–0.70)
Heparin Unfractionated: 0.22 IU/mL — ABNORMAL LOW (ref 0.30–0.70)
Heparin Unfractionated: 0.29 IU/mL — ABNORMAL LOW (ref 0.30–0.70)
Heparin Unfractionated: 0.34 IU/mL (ref 0.30–0.70)
Heparin Unfractionated: 0.36 IU/mL (ref 0.30–0.70)
Heparin Unfractionated: 0.47 IU/mL (ref 0.30–0.70)
Heparin Unfractionated: 0.59 IU/mL (ref 0.30–0.70)
Heparin Unfractionated: 0.64 IU/mL (ref 0.30–0.70)

## 2010-12-04 LAB — HEMOGLOBIN AND HEMATOCRIT, BLOOD
HCT: 25.5 % — ABNORMAL LOW (ref 39.0–52.0)
Hemoglobin: 8.4 g/dL — ABNORMAL LOW (ref 13.0–17.0)

## 2010-12-04 LAB — CK TOTAL AND CKMB (NOT AT ARMC)
CK, MB: 5.8 ng/mL — ABNORMAL HIGH (ref 0.3–4.0)
CK, MB: 7.1 ng/mL — ABNORMAL HIGH (ref 0.3–4.0)
CK, MB: 7.2 ng/mL — ABNORMAL HIGH (ref 0.3–4.0)
Relative Index: 0.8 (ref 0.0–2.5)
Relative Index: 1.1 (ref 0.0–2.5)
Relative Index: 1.3 (ref 0.0–2.5)
Total CK: 528 U/L — ABNORMAL HIGH (ref 7–232)
Total CK: 643 U/L — ABNORMAL HIGH (ref 7–232)
Total CK: 733 U/L — ABNORMAL HIGH (ref 7–232)

## 2010-12-04 LAB — COMPREHENSIVE METABOLIC PANEL
ALT: 35 U/L (ref 0–53)
AST: 48 U/L — ABNORMAL HIGH (ref 0–37)
Albumin: 2.7 g/dL — ABNORMAL LOW (ref 3.5–5.2)
Alkaline Phosphatase: 75 U/L (ref 39–117)
BUN: 13 mg/dL (ref 6–23)
CO2: 23 mEq/L (ref 19–32)
Calcium: 8.5 mg/dL (ref 8.4–10.5)
Chloride: 111 mEq/L (ref 96–112)
Creatinine, Ser: 1.24 mg/dL (ref 0.4–1.5)
GFR calc Af Amer: >60 mL/min (ref >60)
GFR calc non Af Amer: >60 mL/min (ref >60)
Glucose, Bld: 120 mg/dL — ABNORMAL HIGH (ref 70–99)
Potassium: 3.3 mEq/L — ABNORMAL LOW (ref 3.5–5.1)
Sodium: 140 mEq/L (ref 135–145)
Total Bilirubin: 0.4 mg/dL (ref 0.3–1.2)
Total Protein: 5.8 g/dL — ABNORMAL LOW (ref 6.0–8.3)

## 2010-12-04 LAB — RAPID URINE DRUG SCREEN, HOSP PERFORMED
Amphetamines: NOT DETECTED
Barbiturates: NOT DETECTED
Benzodiazepines: POSITIVE — AB
Cocaine: POSITIVE — AB
Opiates: NOT DETECTED
Tetrahydrocannabinol: NOT DETECTED

## 2010-12-04 LAB — APTT: aPTT: 33 seconds (ref 24–37)

## 2010-12-04 LAB — HEMOGLOBIN A1C
Hgb A1c MFr Bld: 5.7 % (ref 4.6–6.1)
Mean Plasma Glucose: 117 mg/dL

## 2010-12-04 LAB — CLOSTRIDIUM DIFFICILE EIA: C difficile Toxins A+B, EIA: NEGATIVE

## 2010-12-04 LAB — PROTIME-INR
INR: 1.03 (ref 0.00–1.49)
Prothrombin Time: 13.4 seconds (ref 11.6–15.2)

## 2010-12-04 LAB — MRSA PCR SCREENING: MRSA by PCR: NEGATIVE

## 2010-12-04 LAB — MAGNESIUM
Magnesium: 2.1 mg/dL (ref 1.5–2.5)
Magnesium: 2.5 mg/dL (ref 1.5–2.5)
Magnesium: 3 mg/dL — ABNORMAL HIGH (ref 1.5–2.5)

## 2010-12-04 LAB — PLATELET COUNT: Platelets: 121 10*3/uL — ABNORMAL LOW (ref 150–400)

## 2010-12-05 LAB — DIFFERENTIAL
Basophils Absolute: 0 10*3/uL (ref 0.0–0.1)
Basophils Relative: 1 % (ref 0–1)
Eosinophils Absolute: 0 10*3/uL (ref 0.0–0.7)
Eosinophils Relative: 0 % (ref 0–5)
Lymphocytes Relative: 29 % (ref 12–46)
Lymphs Abs: 2.4 10*3/uL (ref 0.7–4.0)
Monocytes Absolute: 0.5 10*3/uL (ref 0.1–1.0)
Monocytes Relative: 6 % (ref 3–12)
Neutro Abs: 5.4 10*3/uL (ref 1.7–7.7)
Neutrophils Relative %: 64 % (ref 43–77)

## 2010-12-05 LAB — COMPREHENSIVE METABOLIC PANEL
ALT: 26 U/L (ref 0–53)
AST: 32 U/L (ref 0–37)
Albumin: 3.9 g/dL (ref 3.5–5.2)
Alkaline Phosphatase: 90 U/L (ref 39–117)
BUN: 6 mg/dL (ref 6–23)
CO2: 22 mEq/L (ref 19–32)
Calcium: 9.1 mg/dL (ref 8.4–10.5)
Chloride: 104 mEq/L (ref 96–112)
Creatinine, Ser: 1.27 mg/dL (ref 0.4–1.5)
GFR calc Af Amer: >60 mL/min (ref >60)
GFR calc non Af Amer: 58 mL/min — ABNORMAL LOW (ref >60)
Glucose, Bld: 117 mg/dL — ABNORMAL HIGH (ref 70–99)
Potassium: 4.4 mEq/L (ref 3.5–5.1)
Sodium: 136 mEq/L (ref 135–145)
Total Bilirubin: 0.4 mg/dL (ref 0.3–1.2)
Total Protein: 7.8 g/dL (ref 6.0–8.3)

## 2010-12-05 LAB — CBC
HCT: 48.7 % (ref 39.0–52.0)
Hemoglobin: 15.4 g/dL (ref 13.0–17.0)
MCHC: 31.6 g/dL (ref 30.0–36.0)
MCV: 85.7 fL (ref 78.0–100.0)
Platelets: 212 10*3/uL (ref 150–400)
RBC: 5.69 MIL/uL (ref 4.22–5.81)
RDW: 16 % — ABNORMAL HIGH (ref 11.5–15.5)
WBC: 8.4 10*3/uL (ref 4.0–10.5)

## 2010-12-05 LAB — GLUCOSE, CAPILLARY: Glucose-Capillary: 103 mg/dL — ABNORMAL HIGH (ref 70–99)

## 2010-12-05 LAB — ETHANOL: Alcohol, Ethyl (B): 24 mg/dL — ABNORMAL HIGH (ref 0–10)

## 2010-12-05 LAB — TROPONIN I: Troponin I: 0.07 ng/mL — ABNORMAL HIGH (ref 0.00–0.06)

## 2010-12-05 LAB — CK TOTAL AND CKMB (NOT AT ARMC)
CK, MB: 4.8 ng/mL — ABNORMAL HIGH (ref 0.3–4.0)
Relative Index: 2.8 — ABNORMAL HIGH (ref 0.0–2.5)
Total CK: 174 U/L (ref 7–232)

## 2010-12-05 LAB — PROTIME-INR
INR: 0.86 (ref 0.00–1.49)
Prothrombin Time: 11.6 seconds (ref 11.6–15.2)

## 2010-12-05 LAB — APTT: aPTT: 22 seconds — ABNORMAL LOW (ref 24–37)

## 2010-12-18 LAB — CBC
HCT: 25.2 % — ABNORMAL LOW (ref 39.0–52.0)
HCT: 26 % — ABNORMAL LOW (ref 39.0–52.0)
HCT: 30.3 % — ABNORMAL LOW (ref 39.0–52.0)
HCT: 41.9 % (ref 39.0–52.0)
HCT: 29.5 % — ABNORMAL LOW (ref 39.0–52.0)
Hemoglobin: 13.6 g/dL (ref 13.0–17.0)
Hemoglobin: 8.3 g/dL — ABNORMAL LOW (ref 13.0–17.0)
Hemoglobin: 8.7 g/dL — ABNORMAL LOW (ref 13.0–17.0)
Hemoglobin: 9.9 g/dL — ABNORMAL LOW (ref 13.0–17.0)
Hemoglobin: 9.8 g/dL — ABNORMAL LOW (ref 13.0–17.0)
MCHC: 32.4 g/dL (ref 30.0–36.0)
MCHC: 32.5 g/dL (ref 30.0–36.0)
MCHC: 32.9 g/dL (ref 30.0–36.0)
MCHC: 33.5 g/dL (ref 30.0–36.0)
MCHC: 33.4 g/dL (ref 30.0–36.0)
MCV: 84.8 fL (ref 78.0–100.0)
MCV: 85 fL (ref 78.0–100.0)
MCV: 85.2 fL (ref 78.0–100.0)
MCV: 86.1 fL (ref 78.0–100.0)
MCV: 86.6 fL (ref 78.0–100.0)
Platelets: 162 10*3/uL (ref 150–400)
Platelets: 174 10*3/uL (ref 150–400)
Platelets: 187 10*3/uL (ref 150–400)
Platelets: 211 10*3/uL (ref 150–400)
Platelets: 177 10*3/uL (ref 150–400)
RBC: 2.96 MIL/uL — ABNORMAL LOW (ref 4.22–5.81)
RBC: 3.07 MIL/uL — ABNORMAL LOW (ref 4.22–5.81)
RBC: 3.52 MIL/uL — ABNORMAL LOW (ref 4.22–5.81)
RBC: 4.93 MIL/uL (ref 4.22–5.81)
RBC: 3.4 MIL/uL — ABNORMAL LOW (ref 4.22–5.81)
RDW: 14.3 % (ref 11.5–15.5)
RDW: 14.8 % (ref 11.5–15.5)
RDW: 14.9 % (ref 11.5–15.5)
RDW: 15 % (ref 11.5–15.5)
RDW: 13.8 % (ref 11.5–15.5)
WBC: 5.2 10*3/uL (ref 4.0–10.5)
WBC: 7.9 10*3/uL (ref 4.0–10.5)
WBC: 9 10*3/uL (ref 4.0–10.5)
WBC: 9.4 10*3/uL (ref 4.0–10.5)
WBC: 7.4 10*3/uL (ref 4.0–10.5)

## 2010-12-18 LAB — BASIC METABOLIC PANEL
BUN: 13 mg/dL (ref 6–23)
BUN: 7 mg/dL (ref 6–23)
BUN: 9 mg/dL (ref 6–23)
BUN: 5 mg/dL — ABNORMAL LOW (ref 6–23)
BUN: 5 mg/dL — ABNORMAL LOW (ref 6–23)
CO2: 23 mEq/L (ref 19–32)
CO2: 26 mEq/L (ref 19–32)
CO2: 28 mEq/L (ref 19–32)
CO2: 24 mEq/L (ref 19–32)
CO2: 25 mEq/L (ref 19–32)
Calcium: 8.4 mg/dL (ref 8.4–10.5)
Calcium: 8.5 mg/dL (ref 8.4–10.5)
Calcium: 8.6 mg/dL (ref 8.4–10.5)
Calcium: 8.3 mg/dL — ABNORMAL LOW (ref 8.4–10.5)
Calcium: 8.7 mg/dL (ref 8.4–10.5)
Chloride: 101 mEq/L (ref 96–112)
Chloride: 107 mEq/L (ref 96–112)
Chloride: 108 mEq/L (ref 96–112)
Chloride: 106 mEq/L (ref 96–112)
Chloride: 106 mEq/L (ref 96–112)
Creatinine, Ser: 1.14 mg/dL (ref 0.4–1.5)
Creatinine, Ser: 1.15 mg/dL (ref 0.4–1.5)
Creatinine, Ser: 1.31 mg/dL (ref 0.4–1.5)
Creatinine, Ser: 0.9 mg/dL (ref 0.4–1.5)
Creatinine, Ser: 1.02 mg/dL (ref 0.4–1.5)
GFR calc Af Amer: >60 mL/min (ref >60)
GFR calc Af Amer: >60 mL/min (ref >60)
GFR calc Af Amer: >60 mL/min (ref >60)
GFR calc Af Amer: >60 mL/min (ref >60)
GFR calc Af Amer: >60 mL/min (ref >60)
GFR calc non Af Amer: 57 mL/min — ABNORMAL LOW (ref >60)
GFR calc non Af Amer: >60 mL/min (ref >60)
GFR calc non Af Amer: >60 mL/min (ref >60)
GFR calc non Af Amer: >60 mL/min (ref >60)
GFR calc non Af Amer: >60 mL/min (ref >60)
Glucose, Bld: 111 mg/dL — ABNORMAL HIGH (ref 70–99)
Glucose, Bld: 113 mg/dL — ABNORMAL HIGH (ref 70–99)
Glucose, Bld: 140 mg/dL — ABNORMAL HIGH (ref 70–99)
Glucose, Bld: 108 mg/dL — ABNORMAL HIGH (ref 70–99)
Glucose, Bld: 129 mg/dL — ABNORMAL HIGH (ref 70–99)
Potassium: 3.4 mEq/L — ABNORMAL LOW (ref 3.5–5.1)
Potassium: 3.4 mEq/L — ABNORMAL LOW (ref 3.5–5.1)
Potassium: 3.9 mEq/L (ref 3.5–5.1)
Potassium: 3.2 mEq/L — ABNORMAL LOW (ref 3.5–5.1)
Potassium: 3.8 mEq/L (ref 3.5–5.1)
Sodium: 135 mEq/L (ref 135–145)
Sodium: 139 mEq/L (ref 135–145)
Sodium: 140 mEq/L (ref 135–145)
Sodium: 137 mEq/L (ref 135–145)
Sodium: 138 mEq/L (ref 135–145)

## 2010-12-18 LAB — URINALYSIS, ROUTINE W REFLEX MICROSCOPIC
Bilirubin Urine: NEGATIVE
Glucose, UA: NEGATIVE mg/dL
Glucose, UA: NEGATIVE mg/dL
Hgb urine dipstick: NEGATIVE
Ketones, ur: 15 mg/dL — AB
Ketones, ur: NEGATIVE mg/dL
Nitrite: NEGATIVE
Nitrite: NEGATIVE
Protein, ur: NEGATIVE mg/dL
Protein, ur: NEGATIVE mg/dL
Specific Gravity, Urine: 1.022 (ref 1.005–1.030)
Specific Gravity, Urine: 1.031 — ABNORMAL HIGH (ref 1.005–1.030)
Urobilinogen, UA: 1 mg/dL (ref 0.0–1.0)
Urobilinogen, UA: 1 mg/dL (ref 0.0–1.0)
pH: 5.5 (ref 5.0–8.0)
pH: 6 (ref 5.0–8.0)

## 2010-12-18 LAB — TYPE AND SCREEN
ABO/RH(D): A POS
Antibody Screen: NEGATIVE

## 2010-12-18 LAB — DIFFERENTIAL
Basophils Absolute: 0 10*3/uL (ref 0.0–0.1)
Basophils Relative: 0 % (ref 0–1)
Eosinophils Absolute: 0.1 10*3/uL (ref 0.0–0.7)
Eosinophils Relative: 1 % (ref 0–5)
Lymphocytes Relative: 23 % (ref 12–46)
Lymphs Abs: 1.7 10*3/uL (ref 0.7–4.0)
Monocytes Absolute: 0.7 10*3/uL (ref 0.1–1.0)
Monocytes Relative: 9 % (ref 3–12)
Neutro Abs: 4.9 10*3/uL (ref 1.7–7.7)
Neutrophils Relative %: 67 % (ref 43–77)

## 2010-12-18 LAB — COMPREHENSIVE METABOLIC PANEL
ALT: 22 U/L (ref 0–53)
AST: 27 U/L (ref 0–37)
Albumin: 3.4 g/dL — ABNORMAL LOW (ref 3.5–5.2)
Alkaline Phosphatase: 67 U/L (ref 39–117)
BUN: 11 mg/dL (ref 6–23)
CO2: 25 mEq/L (ref 19–32)
Calcium: 9.1 mg/dL (ref 8.4–10.5)
Chloride: 107 mEq/L (ref 96–112)
Creatinine, Ser: 1.16 mg/dL (ref 0.4–1.5)
GFR calc Af Amer: >60 mL/min (ref >60)
GFR calc non Af Amer: >60 mL/min (ref >60)
Glucose, Bld: 80 mg/dL (ref 70–99)
Potassium: 3.1 mEq/L — ABNORMAL LOW (ref 3.5–5.1)
Sodium: 140 mEq/L (ref 135–145)
Total Bilirubin: 0.5 mg/dL (ref 0.3–1.2)
Total Protein: 6.4 g/dL (ref 6.0–8.3)

## 2010-12-18 LAB — DRUG SCREEN PANEL (SERUM)
Amphetamine Scrn: NEGATIVE
Barbiturate Scrn: NEGATIVE
Benzodiazepine Scrn: NEGATIVE
Cannabinoid, Blood: NEGATIVE
Cocaine (Metabolite): NEGATIVE
Methadone (Dolophine), Serum: NEGATIVE
Opiates, Blood: NEGATIVE
Phencyclidine, Serum: NEGATIVE
Propoxyphene,Serum: NEGATIVE

## 2010-12-18 LAB — RAPID URINE DRUG SCREEN, HOSP PERFORMED
Amphetamines: NOT DETECTED
Barbiturates: NOT DETECTED
Benzodiazepines: NOT DETECTED
Cocaine: NOT DETECTED
Opiates: NOT DETECTED
Tetrahydrocannabinol: NOT DETECTED

## 2010-12-18 LAB — URINE DRUGS OF ABUSE SCREEN W ALC, ROUTINE (REF LAB)
Amphetamine Screen, Ur: NEGATIVE
Barbiturate Quant, Ur: NEGATIVE
Benzodiazepines.: NEGATIVE
Cocaine Metabolites: NEGATIVE
Creatinine,U: 33.6 mg/dL
Ethyl Alcohol: <10 mg/dL (ref <10)
Marijuana Metabolite: NEGATIVE
Methadone: NEGATIVE
Opiate Screen, Urine: NEGATIVE
Phencyclidine (PCP): NEGATIVE
Propoxyphene: NEGATIVE

## 2010-12-18 LAB — POCT I-STAT, CHEM 8
BUN: 14 mg/dL (ref 6–23)
Calcium, Ion: 1.23 mmol/L (ref 1.12–1.32)
Chloride: 107 mEq/L (ref 96–112)
Creatinine, Ser: 1.2 mg/dL (ref 0.4–1.5)
Glucose, Bld: 83 mg/dL (ref 70–99)
HCT: 46 % (ref 39.0–52.0)
Hemoglobin: 15.6 g/dL (ref 13.0–17.0)
Potassium: 3.7 mEq/L (ref 3.5–5.1)
Sodium: 141 mEq/L (ref 135–145)
TCO2: 27 mmol/L (ref 0–100)

## 2010-12-18 LAB — PROTIME-INR
INR: 0.9 (ref 0.00–1.49)
Prothrombin Time: 12.4 seconds (ref 11.6–15.2)

## 2010-12-18 LAB — URINE MICROSCOPIC-ADD ON

## 2010-12-18 LAB — ABO/RH: ABO/RH(D): A POS

## 2010-12-18 LAB — GRAM STAIN

## 2010-12-18 LAB — URINE CULTURE
Colony Count: NO GROWTH
Culture: NO GROWTH

## 2010-12-18 LAB — APTT: aPTT: 28 seconds (ref 24–37)

## 2010-12-18 LAB — PREPARE RBC (CROSSMATCH)

## 2011-01-16 NOTE — Op Note (Signed)
[name], [name]              ACCOUNT NO.:  [id number]   MEDICAL RECORD NO.:  [id number]          PATIENT TYPE:  AMB   LOCATION:  SDS                          FACILITY:  [facility]   PHYSICIAN:  Jessy Oto. Fields, MD  DATE OF BIRTH:  02/06/1951   DATE OF PROCEDURE:  09/10/2008  DATE OF DISCHARGE:  09/10/2008                               OPERATIVE REPORT   PROCEDURE:  Aortogram with bilateral lower extremity runoff.   PREOPERATIVE DIAGNOSIS:  Nonhealing wound, left foot.   POSTOPERATIVE DIAGNOSIS:  Nonhealing wound, left foot.   ANESTHESIA:  Local.   OPERATIVE DETAILS:  After obtaining informed consent, the patient was  brought to the [facility] lab.  The patient was placed in supine position on the  angio table.  Both groins were prepped and draped in the usual sterile  fashion.  Local anesthesia was infiltrated over the right common femoral  artery.  A Majestic needle was used to cannulate the right common  femoral artery and a 0.035 Wholey wire advanced into the abdominal aorta  under fluoroscopic guidance.  Next, a 5-French sheath was placed over  the guidewire in the right common femoral artery.  This was thoroughly  flushed with heparinized saline.  A 5-French pigtail catheter was then  threaded over the guidewire up into the abdominal aorta.  Abdominal  aortogram was obtained.  This shows bilateral single renal arteries  which are patent.  Abdominal aorta has mild atherosclerotic changes but  no focal stenosis.  The left and right common, internal, and external  iliac arteries are patent.  Next, the pigtail catheter was brought down  to the aortic bifurcation.  Bilateral lower extremity runoff views were  obtained.  This shows the left common femoral and right common femoral  arteries are patent.  In the right leg, the right profunda femoris and  superficial femoral artery is patent.  Right popliteal artery is patent.  There are several areas of focal stenosis around the  tibioperoneal trunk  and distal and proximal anterior tibial artery takeoff.  All of these  tibial vessels occluded in the mid leg and runoff was primarily by the  dorsalis pedis artery which reconstitutes at the ankle.   In the left lower extremity, there is occlusion of the left superficial  femoral artery at its origin.  The profunda femoris is patent.  The  above-knee popliteal artery reconstitutes via collaterals but is fairly  diseased.  The below-knee popliteal artery is patent and less diseased  but small.  The tibioperoneal trunk and takeoff of the anterior tibial  artery have several areas of stenosis within them.  The tibioperoneal  trunk divides into a posterior tibial artery.  The peroneal artery is  not well visualized.  The anterior tibial artery is also visualized with  several areas of stenosis distally.  Both of these vessels do cross the  ankle.   Next, the pigtail catheter was pulled back over a guidewire.  A 5-French  crossover catheter was then used to selectively catheterize the left  common iliac artery.  Views of the left lower extremity from the  knee  down were obtained through the selective catheterization.  This again  shows a patent below-knee popliteal artery.  The anterior tibial artery  has a focal stenosis just after its takeoff.  Tibioperoneal trunk also  has a moderate amount of disease with several areas of stenosis  proximally.  On the lateral foot view, the left foot does fill via the  anterior tibial artery continuing as the dorsalis pedis artery.  Posterior tibial artery also fills the posterior portion of the foot.  However, these two arteries do not seem to be in communication with no  obvious plantar arch reconstituted.   Next, the crossover catheter was pulled back over a guidewire.  The 5-  French sheath was thoroughly flushed with heparinized saline.  The  patient was taken to the holding area in stable condition.  There were  no  complications.   OPERATIVE FINDINGS:  1. Occlusion of left superficial femoral artery.  2. Moderate-to-severe tibial disease bilaterally.  3. Runoff on the right foot via the posterior tibial artery, runoff on      the left foot via the anterior tibial and posterior tibial arteries      with focal areas of stenosis in the proximal posterior tibial and      anterior tibial arteries.      Jessy Oto. Fields, MD  Electronically Signed     CEF/MEDQ  D:  09/10/2008  T:  09/11/2008  Job:  [id number]

## 2011-01-16 NOTE — Procedures (Signed)
BYPASS GRAFT EVALUATION   INDICATION:  Follow up left femoral to anterior tibial artery bypass  graft.   HISTORY:  Diabetes:  No.  Cardiac:  No.  Hypertension:  Yes.  Smoking:  Yes.  Previous Surgery:  Please see above.   SINGLE LEVEL ARTERIAL EXAM                               RIGHT              LEFT  Brachial:                    139                140  Anterior tibial:             85                 89  Posterior tibial:            88                 86  Peroneal:  Ankle/brachial index:        0.63               0.64   PREVIOUS ABI:  Date: 10/06/08  RIGHT:  0.90  LEFT:  0.79   LOWER EXTREMITY BYPASS GRAFT DUPLEX EXAM:   DUPLEX:  Patent left femoral to anterior tibial artery bypass graft with  no evidence of focal stenosis.   IMPRESSION:  1. Patent left femoral to anterior tibial artery bypass graft with no      evidence of focal stenosis.  2. Moderately abnormal ankle brachial index with borderline biphasic      Doppler waveform noted in bilateral legs.  3. Status post left femoral-to-anterior artery bypass graft.       ___________________________________________  Jessy Oto. Fields, MD   MG/MEDQ  D:  12/02/2008  T:  12/02/2008  Job:  [id number]

## 2011-01-16 NOTE — Procedures (Signed)
BYPASS GRAFT EVALUATION   INDICATION:  Left lower extremity bypass graft, the patient states legs  appear weaker.   HISTORY:  Diabetes:  No.  Cardiac:  No.  Hypertension:  Yes.  Smoking:  Yes.  Previous Surgery:  Left femoral-anterior tibial artery bypass graft  09/14/2008 by Dr. Oneida Alar.   SINGLE LEVEL ARTERIAL EXAM                               RIGHT              LEFT  Brachial:                    135                130  Anterior tibial:             79                 88  Posterior tibial:            86                 110  Peroneal:  Ankle/brachial index:        0.64               0.81   PREVIOUS ABI:  Date:  01/21/2009  RIGHT:  0.64  LEFT:  0.72   LOWER EXTREMITY BYPASS GRAFT DUPLEX EXAM:   DUPLEX:  Doppler arterial waveforms appear biphasic proximal to, within  and distal to bypass graft.   IMPRESSION:  1. Patent left femoral-anterior tibial artery bypass graft.  2. ABIs appear stable from previous study.  3. No significant changes from previous study.   ___________________________________________  Jessy Oto Fields, MD   AS/MEDQ  D:  04/13/2009  T:  04/13/2009  Job:  [id number]
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
## 2011-01-16 NOTE — Assessment & Plan Note (Signed)
OFFICE VISIT   [name], [name]  DOB:  17-Jun-1951                                       10/06/2008  [id number]   [name] returns for follow-up today after undergoing a left femoral  to anterior tibial bypass on September 14, 2008.  This was done for a  wound on the dorsum of his left foot.  On exam today the ulcer is 1 x  0.3 cm in diameter and continuing to heal.  It is very dry in character  and should heal up in the next few weeks.  All of his incisions are well  approximated.  He has 2+ graft pulse over the lateral aspect of his left  leg just above the incision.  Left foot is warm and well perfused.  Blood pressure today was 158/78, pulse 80.  He had bilateral ABIs  performed today which were 0.8 on the left and 0.9 on the right.  This  is compared to 0.44 preoperatively.  Overall, [name] wounds seem  to be healing.  He does have a chronic left foot drop which was present  preoperatively.  He will be placed in our graft surveillance protocol at  this point.  He will return sooner he has any trouble with the wound  healing.   Jessy Oto. Fields, MD  Electronically Signed   CEF/MEDQ  D:  10/06/2008  T:  10/07/2008  Job:  [id number]

## 2011-01-16 NOTE — Consult Note (Signed)
Arthur, [name] NO.:  [id number]   MEDICAL RECORD NO.:  [id number]          PATIENT TYPE:  INP   LOCATION:  5037                         FACILITY:  [facility]   PHYSICIAN:  Jessy Oto. Fields, MD  DATE OF BIRTH:  1950-11-23   DATE OF CONSULTATION:  03/15/2008  DATE OF DISCHARGE:                                 CONSULTATION   REASON FOR CONSULTATION:  Ischemia, left lower extremity.   The patient is a 60 year old male who presented to the emergency room  today with 1 month history of pain in his left first toe.  He has had  several years of claudication type symptoms in his left leg.  He walks  with a limp due to a chronic foot drop which has been present for 10  years since having back surgery.  On exam today, he had no open  ulcerations on the foot.  The foot was warm and adequately perfused.  He  does walk with a foot drop.  He had ABIs today.  There were 0.5 on the  left and 0.7 on the right.  He has a lengthy history of medical  noncompliance and polysubstance abuse.  He is a marginal candidate for  revascularization.  His pain in his toes most likely multifactorial.  I  believe the best management initially is going to be a risk factor  management of his medical risk factors as well as smoking cessation.  He  will follow with me and repeat ABIs 1 month's time.  Full history and  physical consult was put in the patient's chart.      Jessy Oto. Fields, MD  Electronically Signed     CEF/MEDQ  D:  03/15/2008  T:  03/16/2008  Job:  [id number]

## 2011-01-16 NOTE — Assessment & Plan Note (Signed)
OFFICE VISIT   [name], Arthur  DOB:  [birth year]                                        September 27, 2009  CHART #:  [id number]   HISTORY OF PRESENT ILLNESS:  The patient return to my office today for  followup status post coronary artery bypass graft surgery x4 on August 23, 2010.  He had a slow postoperative course due to a history of  alcohol and cocaine abuse with development of postoperative delirium  that was felt to be secondary to alcohol withdrawal.  He did have some  postoperative atrial fibrillation and was discharged on amiodarone.  He  was discharged to [facility] where he  has been since discharge.  He has no complaints at the present time.   PHYSICAL EXAMINATION:  His blood pressure is 122/78, pulse is 66 and  regular, respiratory rate is 16 and unlabored.  Oxygen saturation on  room air is 98%.  He looks well.  Cardiac exam shows a regular rate and  rhythm with normal heart sounds.  His lung exam is clear.  The chest  incision is healing well and the sternum is stable.  There is no  peripheral edema.  His leg incision is healing well.   DIAGNOSTIC TESTS:  Followup chest x-ray shows clear lung fields and no  pleural effusions.   MEDICATIONS:  1. Amiodarone 200 mg b.i.d.  2. Enteric-coated aspirin 325 mg daily.  3. Lopressor 25 mg b.i.d.  4. Ultram p.r.n. for pain.  5. Folic acid 1 mg daily.  6. Zocor 40 mg at bedtime.  7. Vitamin B1 daily 100 mg.  8. He is taking Chantix as well as a nicotine patch.   IMPRESSION:  Overall, the patient is making a fair recovery following  his surgery.  I encouraged him to continue walking as much as possible.  I advised him against lifting anything heavier than 10 pounds for a  total of 3 months from date of surgery.  He is going to make a followup  appointment with [facility].  He will continue to be followed at  [facility].  I told him he did  not need to return to see me  unless he develops problem with his incisions.  I did decrease his  amiodarone today to 200 mg daily for the next 3 weeks and then it will  be discontinued.   Gilford Raid, M.D.  Electronically Signed   BB/MEDQ  D:  09/27/2009  T:  09/28/2009  Job:  [id number]

## 2011-01-16 NOTE — Discharge Summary (Signed)
[name], [name] NO.:  [id number]   MEDICAL RECORD NO.:  [id number]          PATIENT TYPE:  INP   LOCATION:  2013                         FACILITY:  [facility]   PHYSICIAN:  Jessy Oto. Fields, MD  DATE OF BIRTH:  [birth year]   DATE OF ADMISSION:  09/14/2008  DATE OF DISCHARGE:  09/24/2008                               DISCHARGE SUMMARY   FINAL DISCHARGE DIAGNOSES:  1. Ischemic left lower extremity.  2. History of drug abuse.  3. Dyslipidemia.  4. History of alcohol use.  5. Hypertension.   PROCEDURES PERFORMED:  Left femoral to anterior tibial artery bypass,  non-reversed greater saphenous vein, with an intraoperative arteriogram  times one on September 14, 2008, by Dr. Oneida Alar.   COMPLICATIONS:  None.   DISCHARGE MEDICATIONS:  1. Norvasc 5 mg p.o. daily.  2. Flomax 0.4 mg p.o. daily.  3. Lipitor 10 mg p.o. q.h.s.  4. __________ 100 mg p.o. b.i.d.  5. Aspirin 325 mg p.o. daily.  6. __________ one p.o. daily.  7. Thiamine 100 mg p.o. daily.  8. Nicotine patch 21 mg topically daily.  9. Folic acid 1 mg p.o. daily.  10.Multivitamin one p.o. daily.  11.Tramadol 50 mg p.o. q.6h p.r.n. pain.   CONDITION ON DISCHARGE:  Stable, improving.   DISPOSITION:  He is being transferred to a bed at a skilled nursing  facility for further rehabilitation.   Care of his wound:  __________  on the wound on the dorsum of his foot  on a daily basis.  He was given an appointment to see Dr. Oneida Alar in two  weeks.  He may shower.  His diet is a regular diet.   BRIEF IDENTIFYING STATEMENT:  For full details, please refer to typed  history and physical.  Briefly, this is a very pleasant gentleman, who  was referred to Dr. Oneida Alar with an ischemic left lower extremity.  Dr.  Oneida Alar felt that he could benefit from a femoral to tibial bypass.  He  was informed of the risks and benefits of the procedure and after  careful consideration he elected to proceed with surgery.   HOSPITAL  COURSE:  Preoperative workup was completed as an outpatient.  He was brought in through same-day surgery.  He underwent the  aforementioned revascularization procedure.  For details, please refer  to the typed operative report.  The procedure went without  complications.  He was returned to the postanesthesia care unit,  extubated.  Following stabilization, he was transferred to a bed on a  surgical step-down unit.  He was observed overnight and was able to be  transferred to a bed on a third-floor step-down unit by the third  postoperative day.   On postoperative day 3, __________ had improved.  He did require a lot  of assistance with his movements.  He did appear to be rather slow in  his mental status.  He became confused and it was felt that he had DT's  and he was treated for this.  His confusion is primarily at night and  consists of attempting to discontinue a condom catheter  and IV.   It is felt that he is stable and waiting for transfer to a skilled  nursing facility with a rehabilitation purpose.      Chad Cordial, PA      Jessy Oto. Fields, MD  Electronically Signed    KEL/MEDQ  D:  09/24/2008  T:  09/24/2008  Job:  [id number]

## 2011-01-16 NOTE — Discharge Summary (Signed)
NAMELOWEN, WIRTANEN NO.:  [id number]   MEDICAL RECORD NO.:  [id number]          PATIENT TYPE:  INP   LOCATION:  [id number]                         FACILITY:  [facility]   PHYSICIAN:  Rexene Alberts, M.D.    DATE OF BIRTH:  [birth year]   DATE OF ADMISSION:  03/15/2008  DATE OF DISCHARGE:  03/17/2008                               DISCHARGE SUMMARY   DISCHARGE DIAGNOSES:  1. Left lower extremity pain thought to be secondary to peripheral      vascular disease and claudication.  2. Hyperlipidemia.  3. Coronary artery disease.  4. Tobacco abuse.  5. Extensive cerebral white matter disease.  6. Chronic left foot drop.  7. Tobacco abuse.  8. Noncompliance (the patient was discharged from [facility]      because of noncompliance with followup in the past).   DISCHARGE MEDICATIONS:  1. Norvasc 5 mg daily.  2. Plavix 75 mg daily.  3. Flomax 0.4 mg daily.  4. Lipitor 10 mg daily.  5. Percocet 5 mg every 6 hours as needed for pain.  6. Nicotine patch use as directed.  7. Pletal 100 mg b.i.d.   DISCHARGE DISPOSITION:  The patient is being discharged to home in  improved and stable condition.  Arrangements are being made for the  patient is to follow up with the [facility] in 1-2 weeks and  with Dr. Oneida Alar in approximately 1 month.   CONSULTATIONS:  1. Jessy Oto. Fields, MD  2. [employer].   PROCEDURE PERFORMED:  1. ABI.  The results revealed left ABI 0.5 and right ABI 0.7.  2. CT scan of the head without contrast on March 15, 2008.  The results      revealed no definite acute intracranial findings.  Extensive      chronic small vessel disease involving the basal ganglia, right      thalamus, bilateral corona radiata and the cerebral white matter.  3. Chest x-ray on March 15, 2008.  The results revealed no active      disease.   HISTORY OF PRESENT ILLNESS:  The patient is a 60 year old man with a  past medical history significant for coronary artery disease,  chronic  left foot drop secondary to back surgery approximately 10 years ago,  hypertension, and peripheral vascular disease.  He presented to the  emergency department on March 19, 2008, with a chief complaint of left  leg pain, particularly with ambulation.  He reported that the pain was  always relieved with rest.  When the patient was evaluated in the  emergency department, he was noted to be hemodynamically stable.  A CT  scan of his head revealed no acute intracranial findings..  He was  admitted for further evaluation and management.   For additional details please see the dictated history and physical.   HOSPITAL COURSE:  1. PERIPHERAL VASCULAR DISEASE/CLAUDICATION.  The patient was      restarted on all of his previously prescribed medications, although      the patient admitted being noncompliant in the recent past.      Vascular surgeon Dr. Oneida Alar  was consulted for further evaluation.      Dr. Oneida Alar evaluated the patient soon after admission.  He      acknowledged the results of the ABI.  Given the results of the ABI,      he was not sure if arterial occlusive disease was the absolute      cause of the patient's symptoms.  Also given the patient's      noncompliance, Dr. Oneida Alar felt that the patient would be a marginal      candidate for revascularization.  He also recommended discontinuing      Trental and starting the patient rather on Pletal instead.  The      patient was started on Pletal.  A fasting lipid profile was      assessed and revealed a total cholesterol of 184, triglycerides of      76, HDL of 45, and LDL of 124.  The patient was started on Lipitor      upon discharge.  The patient also admitted to smoking approximately      1 pack of cigarettes per day.  He was strongly advised to stop      smoking.  A nicotine patch was placed during the hospitalization.      Official tobacco cessation counseling was ordered.  The patient      voiced understanding with  regards to the importance of stopping      smoking.  2. HYPERTENSION AND CORONARY ARTERY DISEASE.  The patient was      restarted on Norvasc, Plavix, and aspirin.  He had no complaints of      chest pain during the hospitalization.  His cardiac markers were      essentially negative.  Initially, his blood pressure was elevated.      However prior to hospital discharge, his blood pressure improved to      132/79.  Of note, his TSH was assessed and was within normal limits      at 2.03.  As mentioned above, Lipitor was started upon discharge.  3. CHRONIC LEFT FOOT DROP.  The physical therapist was consulted.  He      recommended ongoing use of his cane.  He also recommended that the      [employer] consulted for an orthotic.  The [employer] has been consulted      and he will see the patient prior to discharge.  Hopefully, the      patient will be fitted with an orthotic for his chronic left foot      drop.  A rolling walker has also been ordered for the patient upon      discharge.      Rexene Alberts, M.D.  Electronically Signed     DF/MEDQ  D:  03/17/2008  T:  03/18/2008  Job:  [id number]   cc:   Jessy Oto. [place]

## 2011-01-16 NOTE — Op Note (Signed)
NAMESTAFFON, SASLOW NO.:  [id number]   MEDICAL RECORD NO.:  [id number]          PATIENT TYPE:  INP   LOCATION:  2013                         FACILITY:  [facility]   PHYSICIAN:  Jessy Oto. Fields, MD  DATE OF BIRTH:  1951/08/24   DATE OF PROCEDURE:  09/14/2008  DATE OF DISCHARGE:                               OPERATIVE REPORT   PROCEDURES:  1. Left femoral to anterior tibial artery bypass with non-reversed      greater saphenous vein.  2. Intraoperative arteriogram x1.   PREOPERATIVE DIAGNOSIS:  Nonhealing wound, left foot.   POSTOPERATIVE DIAGNOSIS:  Nonhealing wound, left foot.   ANESTHESIA:  General.   ASSISTANT:  Jacinta Shoe, PA-C   OPERATIVE DETAILS:  After obtaining informed consent, the patient was  taken to the operating room.  The patient placed in supine position on  operating table.  After induction of general anesthesia and endotracheal  intubation, the patient's entire left lower extremity was prepped and  draped in usual sterile fashion.  Next, a longitudinal incision was made  in the left groin carried down through the subcutaneous tissues down to  the level of left common femoral artery.  This was dissected free  circumferentially.  This was soft in character.  Profunda femoris and  superficial femoral arteries were dissected free circumferentially and  vessel loops were placed around these.  Next, a greater saphenous vein  was dissected free in the medial portion of the incision.  This was  carried down to the level of the saphenofemoral junction.  The vein was  then harvested through the several skip incisions down to the midcalf in  the right leg.  The vein was of good quality approximately 3-4 mm in  diameter.  Small side branches were ligated and divided between silk  ties.  Next, the anterior tibial artery was exposed through a lateral  incision on the left lateral calf.  The anterior tibialis muscle was  reflected superiorly and  the anterior tibial artery was located at the  base of the muscle.  There were several large veins surrounding the  anterior tibial artery and these were ligated and divided between silk  ties.  Anterior tibial artery was also soft in character.  Next, the  vein was doubly clipped in the distal calf and transected.  The vein was  then removed from the left leg all the way up to the level of the  saphenofemoral junction.  The saphenofemoral junction was occluded with  a small Cooley clamp and the origin oversewn with a 5-0 Prolene after  transecting the vein.  The vein was then thoroughly flushed with  heparinized saline and gently distended and it was checked for  hemostasis.  Next, a tunneler was used to connect the lateral anterior  tibial artery incision on the left leg up to the level of the groin.  The graft was tunneled subcutaneously and laterally up in over the  thigh.  The patient was then given 7000 units of intravenous heparin.  Common femoral artery was controlled proximally with a Cooley clamp.  The profunda  femoris and superficial femoral arteries were controlled  with vessel loops.  A longitudinal opening was made in the left common  femoral artery.  The vein was spatulated and placed in a non-reversed  configuration.  The vein was then sewn end of vein to the side of artery  using a running 5-0 Prolene suture.  Just prior to completion of the  anastomosis, this was forebled, backbled and thoroughly flushed.  Anastomosis was secured.  Clamps released.  There was good pulsatile  flow into the proximal portion of the vein graft at this point.  The  valves were then lysed by doubly passing a Mills valvulotome up through  the graft multiple times.  All valves were removed and there was good  pulsatile flow through the graft.  It was then marked with a marking pen  for orientation.  This was then brought through the subcutaneous tunnel  down to level of the anterior tibial  artery.  A tourniquet was then  placed on the thigh just above the knee.  This was inflated to 300 mmHg  after exsanguinating the leg with an Esmarch bandage.  Next, the  anterior tibial artery was opened and this was a reasonably soft vessel.  The graft was pulled taut to length with the leg straightened.  This  graft was then trimmed to length.  The graft was then spatulated and  sewn end of graft to side of the anterior tibial artery using a running  6-0 Prolene suture.  Just prior to completion of anastomosis, it was  forebled, backbled, and thoroughly flushed.  Anastomosis was secured.  Clamps were released.  There was good pulsatile flow in the anterior  tibial artery immediately.  There was a good dorsalis pedis and  posterior tibial Doppler signal.  The dorsalis pedis Doppler signal  completely dissipated with clamping of the graft.  Next, intraoperative  arteriogram was obtained by introducing a 21-gauge butterfly needle on  the proximal aspect of the vein graft and with inflow occlusion.  An  arteriogram was obtained.  This shows a patent distal anastomosis to the  anterior tibial artery.  The needle was then removed from the vein graft  and this repaired with a single 6-0 Prolene suture.  Hemostasis was then  obtained in all incisions.  All incisions were then closed with multiple  layers of running 2-0 and 3-0 Vicryl suture.  The skin of all the  saphenectomy incisions were closed with staples.  The groin incision was  also closed with staples.  The lateral anterior tibial incision was  closed with interrupted nylon sutures.  The patient tolerate procedure  well and there were no complications.  Instruments, sponge, and needle  counts were correct at the end of the case.  The patient was taken to  recovery room in stable condition.      Jessy Oto. Fields, MD  Electronically Signed     CEF/MEDQ  D:  09/17/2008  T:  09/18/2008  Job:  [id number]

## 2011-01-16 NOTE — Assessment & Plan Note (Signed)
OFFICE VISIT   Arthur, [name]  DOB:  30-Jan-1951                                       09/08/2008  [id number]   The patient returns for followup today.  He was last seen in September  of 2009.  He is a 60 year old male who has known history of lower  extremity arterial occlusive disease.  His primary atherosclerotic risk  factors include smoking and cocaine abuse.  He has a chronic left  footdrop secondary to previous back problems.   He was doing well until approximately a month ago.  He developed a  laceration on the dorsum of his left foot from the corner of a  refrigerator.  This was sutured in the emergency room but even after  removal of the sutures the wound has not healed in greater than a month.  He denies any pain in the wound.   PAST MEDICAL HISTORY:  Also significant for hypertension.  He has cut  back on his alcohol use but still drinks approximately 1 quart of  alcohol per day.  He also a history of coronary artery disease.   MEDICATIONS:  Include:  1. Norvasc 5 mg once a day.  2. Plavix 75 mg once a day.  3. Flomax 0.4 mg once a day.  4. Lipitor 10 mg once a day.  5. Percocet as needed.  6. Nicotine patch as needed.  7. Pletal 100 mg twice a day.   PHYSICAL EXAM:  Blood pressure is 154/95 in the right arm.  Pulse is 74  and regular.  Neck:  Has 2+ carotid pulses without bruit.  Chest:  Clear  to auscultation.  Cardiac Exam:  Regular rate and rhythm without murmur.  Abdomen:  Soft, nontender, nondistended, thin.  He has 2+ femoral pulses  with absent popliteal and pedal pulses bilaterally.  Both feet are cool  on palpation.  He has a left footdrop on ambulation.  There is a 4 x 2-  cm open laceration on the dorsum of his left foot.  There is some  granulation tissue at the base.   ABIs performed today were 0.47 on the left and 0.68 on the right.  These  are similar to his previous ABIs in July.  These were also similar to  those ABIs performed in September.   Although the patient's ABIs are reasonably stable, he has now developed  a nonhealing wound on the dorsum of his left foot.  I believe he is at  risk of limb loss unless we can increase perfusion to the left leg to  try to heal this wound.  I had a lengthy frank discussion with the  patient and one of his friends today that he is at high risk of limb  loss.  I also had a lengthy discussion with him about options of primary  amputation if the foot continues to deteriorate versus an arteriogram  and revascularization.  He has decided to pursue arteriogram and  revascularization.  I did inform him that the biggest risk with a bypass  procedure would be myocardial infarction and possible death.  He is  ambulatory despite having the foot drop in his left leg.  He is a  marginal candidate for bypass procedure.  However, he will continue to  be more functional if he has both limbs.  After this lengthy  discussion,  he has opted again for revascularization.  I have scheduled him for an  arteriogram on Friday, 09/10/2008.  He has been using cocaine recently.  I did inform him today that we would perform a drug screen prior to his  procedure for safety reasons as far as anesthetics that may need to be  used and I have informed him that if he tests cocaine positive then we  would not perform any of the procedures mentioned above.  He understands  and agrees to proceed.  Arteriogram procedure was described to the  patient today.  Risks, benefits, possible complications, and procedure  details including but not limited to bleeding, arterial injury, contrast  reaction.   Jessy Oto. Fields, MD  Electronically Signed   CEF/MEDQ  D:  09/08/2008  T:  09/09/2008  Job:  [id number]

## 2011-01-16 NOTE — H&P (Signed)
NAME:  [name], [name] NO.:  [id number]   MEDICAL RECORD NO.:  [id number]          PATIENT TYPE:  INP   LOCATION:  1823                         FACILITY:  [facility]   PHYSICIAN:  Sherryl Manges, M.D.  DATE OF BIRTH:  [birth year]   DATE OF ADMISSION:  03/15/2008  DATE OF DISCHARGE:                              HISTORY & PHYSICAL   CHIEF COMPLAINT:  Increasing left lower extremity pain for 5 months,  initially on ambulation, now at rest, also difficulty walking secondary  to pain.   HISTORY OF PRESENT ILLNESS:  This is a 60 year old male.  The patient is  a good historian and history is also supplemented by spouse, who was  present in the emergency department.  Essentially, history is as above.  The patient has not been either compliant with his medications or follow-  up in the past.  As a matter of fact, he has been discharged from  [facility] because of noncompliance with follow-up.  Be that it  may, family made him come to the emergency department because of  persistent symptoms.   PAST MEDICAL HISTORY:  1. Coronary artery disease, status post MI in March 2006, status post      PCI and two drug-eluting stents, one to the left circumflex and one      to the AV branch of circumflex.  2. Status post admission on March 2008 through November 06, 2006, with      cocaine induced chest pain.  3. History of back injury, status post fall approximately 10 years      ago, required surgery.  4. Left foot drop 10 years ago after above-mentioned back surgery.  5. Smoking history.  6. Previous history of cocaine use.  The patient claims to have quit      approximately 1-1/2 years ago.  7. Previous history of alcohol abuse, now only utilizes occasional      alcohol.  8. Peripheral vascular disease.  9. Hypertension.   MEDICATION HISTORY:  1. P.r.n. sublingual Nitroglycerin 0.4 mg, the patient has not      required this recently.  2. Plavix 75 mg p.o. daily.  The  patient has not been compliant with      this for the past 2 years.   ALLERGIES:  NO KNOWN DRUG ALLERGIES.   REVIEW OF SYSTEMS:  As per HPI and chief complaint.  The patient denies  abdominal pain, vomiting or diarrhea.  Denies chest pain or shortness of  breath.  He however, admits to nocturia about 4-5 per night, as well as  dribbling incontinence.   SOCIAL HISTORY:  The patient is disabled due to back pain and foot drop.  He is married and has one offspring with his current spouse, although he  apparently has other children.  He smokes approximately 1 packet of  cigarettes per day, has done so for the past 8 years.  He used to drink  alcohol everyday; however, claims that now he only drinks occasionally.  This is corroborated by his wife.   FAMILY HISTORY:  The patient's parents died when the patient was  in  early childhood, about 75-40 years old, therefore noncontributory.   PHYSICAL EXAMINATION:  VITAL SIGNS:  Temperature 97.6, pulse 72 per  minute and regular, respiratory rate 18, BP 143/82 mmHg.  Pulse oximeter  95% on room air.  GENERAL:  The patient did not appear to be in obvious acute distress at  time of this evaluation.  Alert, communicative, not short of breath at  rest.  HEENT:  No clinical pallor.  No jaundice.  No conjunctival injection.  Throat is clear.  NECK:  Supple.  JVP not seen.  No palpable lymphadenopathy.  No palpable  goiter.  CHEST:  Clinically clear to auscultation.  No wheezes or crackles.  HEART:  S1-S2 heard, normal regular.  No murmurs.  ABDOMEN:  Full, soft and nontender.  No palpable organomegaly.  No  palpable masses.  Normal bowel sounds.  EXTREMITIES:  Lower extremity examination, no pitting edema.  He has  bilateral strongly palpable femoral pulses, as well as popliteal pulses.  However, dorsalis pedis and posterior tibial pulses are faint on  palpation.  Left lower extremity appears atrophic, and he does have a  left foot drop.  CENTRAL  NERVOUS SYSTEM:  Apart from left foot drop, no other focal  neurologic deficit.   INVESTIGATION:  CBC - WBC 5.0, hemoglobin 13.2, hematocrit 39.9,  platelets 204.  Electrolytes; sodium 132, potassium 3.8, chloride 107,  CO2 of 26, BUN 10, creatinine 1.24, glucose 85.  Urinalysis is negative.  Chest x-ray dated March 15, 2008, shows no active disease.  Head CT scan  dated March 15, 2008, shows no definite acute intracranial findings.  Extensive chronic small vessel disease involving the basal ganglia,  right thalamus and bilateral corona radiata plus cerebral white matter.  Ankle-brachial indexes done on March 15, 2008, shows patella pressure 140  on the right, 188 on the left, anterior tibial 100 on the right, 20 on  the left posterior tibial, 88 on the right, 70 on the left.  Ankle-  brachial indices 0.71 on the right, 0.50 on the left, ie, moderate  reduction of ankle-brachial indices on the right, moderate to severe  reduction in left ankle-brachial indices with damped monophasic pedal  waveforms.   ASSESSMENT AND PLAN:  1. Peripheral vascular disease.  The patient appears to experience      increasing severity, with rest pain since his last evaluation      approximately one and a half years ago.  We shall commence the      patient on Trental, continue Plavix/Aspirin.  Add calcium channel      blocker treatment.  Request vascular surgical consultation.  The      patient has strongly been counseled to quit smoking.   1. Smoking history.  We have counseled as mentioned above.  Will use      Nicoderm CQ patch.   1. History of coronary artery disease.  This appears asymptomatic.  We      shall continue Plavix.  As mentioned above, low-dose Aspirin will      be added to treatment.  We shall check lipid profile and TSH.  For      completeness, will do 12-lead EKG.   1. Left foot drop.  This is chronic.  We shall request physical      therapy and occupational therapy to evaluate and  recommend      orthosis.   1. Hypertension.  The patient's BP was initially 185/97 mmHg, recheck  143/82 mmHg.  We shall manage with calcium channel antagonist for      now.   1. Remote history of cocaine use.  We shall do a drug screen to verify      whether the patient has continued to utilize this substance.   1. Prostatism.  Check prostatic specific antigen.  Meanwhile, commence      the patient on Flomax.   Further management will depend on clinical course.      Sherryl Manges, M.D.  Electronically Signed     CO/MEDQ  D:  03/15/2008  T:  03/15/2008  Job:  [id number]

## 2011-01-19 NOTE — Consult Note (Signed)
NAME:  [name], [name] NO.:  [id number]   MEDICAL RECORD NO.:  [id number]          PATIENT TYPE:  OIB   LOCATION:  3705                         FACILITY:  [facility]   PHYSICIAN:  Thayer Headings, M.D. DATE OF BIRTH:  Mar 01, 1951   DATE OF CONSULTATION:  11/04/2006  DATE OF DISCHARGE:                                 CONSULTATION   [name] is a 60 year old gentleman with a history of coronary  artery disease.  He presents to the ER with episodes of chest pain for  the past day or so.   Mr. Sogge has a stent placed in his left circumflex artery by Dr.  Olevia Perches several years ago.  He originally was assigned to the [employer] but was dismissed from the practice because of noncompliance.  He stopped taking all of his medications and now presents the ER with  further episodes of chest pain.   The patient reports having chest pain for the past day or so.  He has  not taken his Plavix in many months.  In addition, he has not taken any  of his other medications and does not remember what they are.   The patient reports having vague chest pain this morning.  He has been  using cocaine over the past 5 days.  He has been more and more short of  breath.  The patient presents to the ER.  He continues to have some neck  and chest pain, although he does not appear to be acute.   The patient has progressive shortness breath recently.  He is not able  to provide much in the way of additional history.   CURRENT MEDICATIONS:  Aspirin.   ALLERGIES:  None.   PAST MEDICAL HISTORY:  History of coronary artery disease.   SOCIAL HISTORY:  The patient admits to using cocaine regularly.  He  smokes one and a half pack of cigarettes a day and he drinks 6-12 beers  a day.   FAMILY HISTORY:  Noncontributory.   REVIEW OF SYSTEMS:  As noted in the HPI.  He denies any syncope or  presyncope.  He denies any heat or cold intolerance.  He has had  progressive shortness breath.   He has trouble getting around because of  some leg problems.  Review of systems is otherwise negative except as  noted in the HPI.   EXAMINATION:  He is a middle-aged gentleman in no acute distress.  He is  alert and oriented x3 and his mood and affect are normal.  His blood  pressure is 91/56 with a heart rate of 86.  HEENT:  Exam reveals 2+ carotids.  He has no bruits, no JVD, no  thyromegaly.  LUNGS:  Clear to auscultation.  HEART:  Regular rate, S1-S2.  ABDOMEN:  Exam reveals good bowel sounds and is nontender.  EXTREMITIES:  He has no clubbing, cyanosis or edema.  His pulses are  intact.   His EKG reveals normal sinus rhythm.  He has nonspecific ST changes and  some early repolarization changes.  There are no acute changes.  LABORATORY DATA:  His troponin is 0.05.  His creatinine is 1.2.  His  urine drug screen reveals cocaine and opiates (he received morphine by  EMS on his way to the hospital).   The patient presents with recurrent episodes of chest pain.  He has a  stent in his left circumflex artery and has not taken Plavix in quite  some time.  He has a serious problem with noncompliance.  He missed over  6 appointments with [facility] and has been dismissed from the practice.  He seems to have trouble getting his prescribed medications but has no  trouble getting cocaine.  I told him that he was really going to do some  damage and he was going to have recurrent heart attacks if he did not  take his medications.  Will continue with medical therapy for now.  As  long as his enzymes stay negative, I do not think that we need to  proceed with heart catheterization but we may to repeat his heart cath  if his enzymes become positive.  We will get an echocardiogram for  further evaluation of his dyspnea.  All of his other medical issues will  be addressed by the family practice team.           ______________________________  Thayer Headings, M.D.     PJN/MEDQ  D:   11/04/2006  T:  11/05/2006  Job:  [id number]   cc:   Almyra Free A. Jimmye Norman, M.D.  Thomas C. Wall, MD, [facility]

## 2011-01-19 NOTE — Cardiovascular Report (Signed)
NAME:  [name], [name] NO.:  [id number]   MEDICAL RECORD NO.:  [id number]          PATIENT TYPE:  INP   LOCATION:  6599                         FACILITY:  [facility]   PHYSICIAN:  Eustace Quail, M.D. [facility] DATE OF BIRTH:  1951/04/11   DATE OF PROCEDURE:  11/28/2004  DATE OF DISCHARGE:                              CARDIAC CATHETERIZATION   CLINICAL HISTORY:  [name] is 60 years old and was admitted with chest  pain and had EKG changes consistent with a non-ST elevation myocardial  infarction.  He was studied earlier today by Dr. Norva Pavlov and found to  have total occlusion of the marginal branch of the circumflex artery and  severe disease in the AV branch.  There also appeared to be a total  occlusion of a small first marginal branch prior to a large second marginal  branch.  He had moderate nonobstructive disease of the LAD and right  coronary artery and good overall LV function.   PROCEDURE:  The procedure was performed by the right femoral artery using an  arterial sheath and a CLS4 6 [place] guiding catheter with side holes.  We  initially crossed the lesion totally obstructed obtuse marginal vessel with  a Prowater wire without difficulty.  We passed a second Prowater wire down  the AV branch.  We then dilated the obtuse marginal with a 2.25 by 20 mm  Maverick and this re-established flow.  We then attempted to go in with a  cutting balloon to dilate the side branch but were unable to pass the  cutting balloon into the lesion which was located from the marginal branch.  We then went in with a 2.5 by 12 mm Maverick and performed three inflations  with 8 atmospheres for 30 seconds.  We then considered Y stenting so we  changed out for a 7 [place] CLS guiding catheter and re-crossed the lesions  with both wires.  We took a 2.5 by 15 balloon down into the marginal branch  and a 2.5 by 20 balloon down the AV branch.  We performed simultaneous  inflations but the  balloons banana peeled on each other and slipped.  We  felt the proximal area at the bifurcation was too small to accept Y  stenting.  For this reason, we decided to stent the main channel.  We did  this with a 2.5 by 20 mm Taxus stent deploying this with one inflation up to  14 atmospheres for 30 seconds.  We removed the vessel from the side branch  first.  This did not compromise the AV branch, too much, so we decided not  to post dilate the AV branch.  Final diagnostic views were then performed  through the guiding catheter.  The right femoral artery was closed with  Angioseal.  The patient tolerated the procedure well and left the laboratory  in satisfactory condition.  We had used Bivalirudin anticoagulation and the  patient was given 300 mg Plavix before the procedure.   RESULTS:  Initially, the second and large circumflex marginal vessel was  completely occluded at its origin.  Following stenting,  this improved to 0%.  The stent crossed the AV branch of the circumflex.  The AV branch, which was  initially 90%, improved to about 30% following PTCA, and then was pinched to  about 60% following stenting across it into the marginal branch.  TIMI flow  improved in the marginal branch from TIMI I to TIMI III and TIMI flow in the  AV branch remained TIMI III before and after.   CONCLUSION:  Successful PCI of bifurcation lesion involving the circumflex  marginal vessel and AV branch of the circumflex artery with improvement in  the circumflex marginal vessel with a Taxus drug eluting stent from 100% to  0% (main branch) and improvement in the AV circumflex (side branch) from 90%  to 60% with PTCA.   DISPOSITION:  The patient returned to his room for further observation.  There was another small proximal marginal branch which was occluded which we  elected not to treat.      BB/MEDQ  D:  11/28/2004  T:  11/28/2004  Job:  [id number]   cc:   Scarlett Presto, M.D.  Fax: [place] Wall, M.D.

## 2011-01-19 NOTE — Discharge Summary (Signed)
NAMESENTELL, MELENDRES NO.:  [id number]   MEDICAL RECORD NO.:  [id number]          PATIENT TYPE:  INP   LOCATION:  6522                         FACILITY:  [facility]   PHYSICIAN:  Marijo Conception. Wall, M.D.   DATE OF BIRTH:  1951/02/04   DATE OF ADMISSION:  11/28/2004  DATE OF DISCHARGE:  11/30/2004                           DISCHARGE SUMMARY - REFERRING   PROCEDURES:  Percutaneous intervention/TAXUS stenting obtuse marginal and  PTCA AV/circumflex November 28, 2004.   REASON FOR ADMISSION:  [name] is a 59 year old male with no prior  cardiac history with cardiac risk factors notable for history of  hypertension, tobacco, age, and sex who presented to the emergency room with  chest pain.  Please refer to dictated admission note for full details.   LABORATORY DATA:  Cardiac enzymes:  CPK 312/13.8, troponin I 0.36 on  admission, follow-up 960/75 and troponin I of 4.51.  Lipid profile:  Total  cholesterol 172, triglyceride 55, HDL 53, LDL 108.  TSH 2.67.  Hematocrit  38, platelets 234 pre discharge.  Potassium 3.8, BUN 8, creatinine 1.2 pre  discharge.   Chest x-ray:  COPD.   HOSPITAL COURSE:  Patient was admitted for further evaluation of chest pain  and was placed on medication regimen consisting of aspirin, metoprolol,  intravenous nitroglycerin, and heparin.   Serial cardiac markers were suggestive of non ST elevation myocardial  infarction.  Patient also had short runs of NSVT (5-6 beats).  Patient was  thus referred for diagnostic coronary angiography.   Cardiac catheterization, performed by Dr. Scarlett Presto (see report for  full details) revealed three vessel coronary artery disease with preserved  left ventricular function (EF 50%).   Patient was referred to Dr. Eustace Quail for percutaneous intervention.  Patient underwent successful TAXUS stenting of a 100% CFX/obtuse marginal  lesion and PTCA dilatation of a 90% AV/circumflex lesion initially to 30%,  but ultimately to 60% residual stenosis.   Wound was closed with AngioSeal.   POSTOPERATIVE COURSE:  No recurrent ventricular tachycardia.  Patient  ambulated without any chest pain.  Patient was cleared for discharge on  hospital day #2.   Carotid Dopplers were done for evaluation of right carotid bruit on  admission.  No bilateral IC stenosis was noted.   DISCHARGE MEDICATIONS:  1.  Plavix 75 mg daily (x1 year).  2.  Coated aspirin 325 mg daily.  3.  Metoprolol 50 mg b.i.d.  4.  Zocor 40 mg q.h.s.  5.  Nitrostat 0.4 mg as directed.   INSTRUCTIONS:  1.  No strenuous activity/work until seen by physician.  2.  No driving x1 week.   The patient is scheduled to follow up with Dr. [name] on Thursday,  April 13 2:30 p.m.   DISCHARGE DIAGNOSES:  1.  Non ST elevation myocardial infarction.      1.  Status post TAXUS stenting 100% obtuse marginal; PTCA 90%          AV/circumflex November 28, 2004.      2.  Ejection fraction 50%.  2.  Status post nonsustained ventricular tachycardia.  3.  Dyslipidemia.  4.  Hypertension.  5.  Tobacco.      GS/MEDQ  D:  11/30/2004  T:  11/30/2004  Job:  [id number]

## 2011-01-19 NOTE — H&P (Signed)
[name], GABE NO.:  [id number]   MEDICAL RECORD NO.:  [id number]          PATIENT TYPE:  EMS   LOCATION:  MAJO                         FACILITY:  [facility]   PHYSICIAN:  Marijo Conception. Wall, M.D.   DATE OF BIRTH:  Mar 04, 1951   DATE OF ADMISSION:  11/28/2004  DATE OF DISCHARGE:                                HISTORY & PHYSICAL   CHIEF COMPLAINT:  Chest tightness three times over the last 3 days while  doing nothing.   HISTORY OF PRESENT ILLNESS:  [name] is a 60 year old black gentleman  who had the onset of substernal chest tightness with tingling in his right  arm on three separate occasions, 30 to 45 minutes over the past 3 days.  There were no associated symptoms. He has no exertional symptoms.   His risk factors are age, sex, tobacco use, and unknown but diagnosed  hypertension tonight.   He does not attend a physician on a regular basis.   PAST MEDICAL HISTORY:   ALLERGIES:  No known drug allergies.   CURRENT MEDICATIONS:  None.   PAST SURGICAL HISTORY:  He has had previous lumbar disk surgery with  residual numbness and coldness in his left leg and foot. He cannot walk on  it. He has had no other major surgeries.   He smokes a pack of cigarettes a day. He drinks about one beer a day on  occasion.  He denies any illicit drugs.   SOCIAL HISTORY:  He lives in [place]. His wife is with him tonight. He is  disabled.   FAMILY HISTORY:  Noncontributory.   REVIEW OF SYSTEMS:  Unremarkable for any melena or hematochezia or any GI  problems.   EKG with paramedics tonight showed early repolarization. This was repeated  on several occasions in the emergency room with no change. He is currently  symptom free.   PHYSICAL EXAMINATION:  GENERAL:  In no acute distress. He looks older than  his stated age.  VITAL SIGNS:  Blood pressure is 143/128 when starting, currently 143/90.  Pulse is 80 and regular, respirations 18. Saturations 95%. He is  afebrile.  HEENT:  Sclerae are injected and muddy, particularly the right. Extraocular  movements are intact. PERRLA.  NECK:  Carotid upstrokes were equal bilaterally with a soft bruit on the  right.  LUNGS:  Clear.  HEART:  Reveals a regular rate and rhythm without murmur, rub or gallop. S2  splits.  ABDOMEN:  Soft, good bowel sounds, no midline bruits, no hepatomegaly.  EXTREMITIES:  Reveal no clubbing, cyanosis or edema. Pulses are present and  equal.  NEUROLOGIC EXAM:  Is intact.   His point of care enzymes x1 are negative. His PTT is normal. Other labs are  pending.   ASSESSMENT:  1.  Chest pain, rule out unstable angina or myocardial infarction.  2.  Hypertension untreated.  3.  Right carotid bruits.  4.  Tobacco use.  5.  History of lumbar disk surgery.   PLAN:  1.  Admit to telemetry.  2.  Intravenous nitroglycerin and heparin.  3.  Beta blocker.  4.  Aspirin.  5.  Lipids and check TSH.  6.  Carotid Dopplers.   If his cardiac enzymes are negative will reassess after this and do  Cardiolite. For my view if positive, he will need catheterization.      TCW/MEDQ  D:  11/28/2004  T:  11/28/2004  Job:  [id number]

## 2011-01-19 NOTE — Letter (Signed)
June 13, 2006     [name]  [id number] [place], [place]  [address]   RE:  [name], [name]  MRN:  [id number]  /  DOB:  [birth year]   Dear [name]:   As you know, you have missed nearly a half dozen appointments with [place] that  have been scheduled at [facility]. This is in reference to following  up appropriately your heart attack on November 28, 2004.   Because of this, we are dismissing you from our practice. You have 30 days  to find another physician, if you so wish. During those 30 days, however, if  an emergency situation arises, [facility] will provide treatment.   Your medical records will be made available to another care giver, upon  receipt of a valid release of information from you.   I hope this letter finds you doing well.   Best wishes,      Marijo Conception. Verl Blalock, MD, [facility]    TCW/MedQ  /  Job #:  [id number]  DD:  06/13/2006 / DT:  06/15/2006

## 2011-01-19 NOTE — Discharge Summary (Signed)
[name], [name] NO.:  [id number]   MEDICAL RECORD NO.:  [id number]          PATIENT TYPE:  INP   LOCATION:  3705                         FACILITY:  [facility]   PHYSICIAN:  Karlton Lemon, M.D.    DATE OF BIRTH:  09-21-1950   DATE OF ADMISSION:  11/04/2006  DATE OF DISCHARGE:  11/06/2006                               DISCHARGE SUMMARY   DISCHARGE DIAGNOSES:  1. Chest pain, due to spasms from cocaine use.  2. Weight loss, unknown etiology.  3. Peripheral vascular disease.  4. Hypoglycemia.  5. Foot drop.  6. Tobacco abuse.  7. Cocaine abuse.   CONSULTS:  Cardiology.   PROCEDURES:  1. Chest x-ray:  Showed bibasilar atelectasis versus airspace disease.  2. EKG:  Showed LVH with no ST-T or Q-wave changes.  Regular rate and      rhythm.  3. Echocardiogram:  Showed an ejection fraction 35-40%, with mild LVH;      also mild mitral valvular regurgitation.  4. Ankle brachial indices:  Showed an absent right PTV, mildly      decreased arterial flow bilaterally.  The right side is 80, left      side 93.  Complicated by questionable increase in these numbers due      to calcified vessels.   HOSPITAL COURSE:  #1 - CHEST PAIN.  Due to spasm of cocaine use.  Consider unstable angina.  Initially this patient was at rest, history  of coronary artery disease, and pain similar to previous myocardial  infarction.  In March 2006 he had an MI with stent x2 placement; but did  not take medications to follow up after discharge.  New stents with drug  eluting stents.  With this admission pain had improved with  nitroglycerin.  His cardiac markers were negative x3.  He was cocaine  positive by UDS.  His EKG with LVH, but unchanged.  TSH was normal.  Chest x-ray was without mediastinal widening.  The patient had worsening  dyspnea on exertion x5 months per history, 2-3 pillow orthopnea, and  paroxysmal nocturnal dyspnea 3x per week.  An echocardiogram done showed  mild LVH with  an ejection fraction of 35-40%.  His LDL was 98, HDL 60,  triglycerides 44.  His fasting blood glucose was 78 on discharge.  On  admission he was placed on heparin and daily aspirin; though heparin was  discontinued on discharge (as pain was not thought to be due to coronary  artery disease).  He was pain-free on discharge.  We did not place him  on a beta blocker due to his cocaine use.  Protonix was given initially  for a history of sour brash, though he had no improvement with it as  well as with the GI cocktail.  He was written for morphine and  nitroglycerin p.r.n.  Cardiology consult on admission followed the  patient throughout the hospital stay, and the patient should just be  medically manage; and that his pain was due to cocaine yes.  On  discharge he was placed on: L:lisinopril 10 mg daily. Nitroglycerin  p.r.n. to go  home with.  Aspirin 81 mg daily.   #2 - WEIGHT LOSS.  Per patient history, 10 pounds and not intended.  CFH  was normal.  He has been eating well.  He has no signs or symptoms of  tuberculosis.  He had no signs or symptoms of malignancy.  Hemoccult  negative.  HIV nonreactive, with a PSA of 2.33 and nodules on prostate  by examination.  Read the outpatient follow-up and a probable  colonoscopy.   #3 - DECREASED PERIPHERAL PULSES:  There were ABIs, but with an absent  PTV; the right side was 80 and left side 93.  So, he has mild disease  with these studies; but calcification bilaterally.  So, these are likely  falsely elevated.  There are claudication symptoms.  He is advised that  walking is exercise daily, and to sort of increase how far he can walk  each day.  He is to monitor for ulcers or any areas of decreased  healing.  This was to be followed up.  We established his care as an  outpatient.   #4 - HYPOGLYCEMIA.  Resolved and asymptomatic.   #5 - FOOT DROP.  Per the patient, this is not new and has been present  since his back surgery some years ago.  We  do not have these records.  A  PT/OT consult suggested that he have an AFO placed on his left leg;  which was done.   #6 - TOBACCO ABUSE.  Ordered a cessation consult.   #7 - COCAINE ABUSE.  Social work consult and new information provided on  rehabilitation services.   DISCHARGE MEDICATIONS AND INSTRUCTIONS:  1. Lisinopril 10 mg p.o. daily.  2. Aspirin 81 mg daily.  3. Nitroglycerin 0.4 mg spray sublingually every 5 mins.  To call 911      if used.   The patient was instructed to stop cocaine use and stopping smoking;  these are the best things that he could do to protect his heart.   CONDITION:  Stable.   FOLLOWUP:  1. [facility] information, which was given.  2. Established care.  3. Rehabilitation services.  4. Treatment for mild claudication and peripheral vascular disease.  5. Follow up on weight loss.           ______________________________  Karlton Lemon, M.D.     SH/MEDQ  D:  11/10/2006  T:  11/10/2006  Job:  [id number]

## 2011-01-19 NOTE — Cardiovascular Report (Signed)
NAMEMarland [name]  MIAN, KOLM NO.:  [id number]   MEDICAL RECORD NO.:  [id number]          PATIENT TYPE:  INP   LOCATION:  4736                         FACILITY:  [facility]   PHYSICIAN:  Scarlett Presto, M.D.   DATE OF BIRTH:  04/29/1951   DATE OF PROCEDURE:  11/28/2004  DATE OF DISCHARGE:                              CARDIAC CATHETERIZATION   HISTORY OF PRESENT ILLNESS:  [name] is a 60 year old man with no known  coronary artery disease who presented to the [facility] complaining  of chest discomfort.  He has a history of hypertension and tobacco abuse.  He had evidence of mildly elevated troponin and had a short run of  nonsustained ventricular tachycardia while in the hospital.  He was brought  to the catheterization laboratory.   DETAILS OF THE PROCEDURE:  After obtaining informed consent, the patient was  brought to the cardiac catheterization laboratory in a fasting state.  He  was prepped and draped in the usual sterile manner and a local anesthetic  was obtained over the right groin using 1% lidocaine without epinephrine.  The right femoral artery was cannulated using the modified Seldinger  technique with a 6 French 10 cm sheath and left heart catheterization was  performed using a 6 French Judkins left #4, 6 French Judkins right #4 and a  6 French pigtail catheter.  Pigtail catheter was used for left  ventriculography in the 30 degree RAO view and the 60 degree LAO 30 degree  caudal view, two separate injections with the power injector.  At the  conclusion of the procedure the catheter was removed, the patient was moved  back to the cardiology holding area with the sheath still in place.  The  patient received a low dose of heparin and was pretreated with Plavix for  percutaneous revascularization today.   RESULTS:  Left ventricular pressure 150/10 with an end diastolic pressure of  20 mmHg.   Aortic pressure 141/86 with a mean arterial pressure of  108 mmHg.   CORONARY ANGIOGRAPHY:  The left main coronary artery is a short artery which  is relatively large and is widely patent.   The left anterior descending coronary artery is a large transapical vessel  with three separate diagonal branches.  There is a 50% lesion in the  midportion of the artery which is slightly hazy, probably from  calcification.   The circumflex coronary artery is a moderate caliber nondominant vessel with  significant calcification in its proximal portion.  There is a small first  obtuse marginal which appears to be occluded at its ostium and fills only  partially.  There is a second obtuse marginal which is relatively large and  is also significantly stenotic and fills via collateralization from the  diagonal.  There is a 75% lesion in the ongoing circumflex in the AV groove.   The right coronary artery is a small but dominant vessel which has a 50%  lesion at the acute marginal.   Left ventriculogram reveals an ejection fraction of about 50% with inferior,  inferolateral, anterolateral and lateral mild hypokinesis, 1+  mitral  regurgitation.  Estimated ejection fraction 50%.   ASSESSMENT:  1.  Three vessel coronary disease most significant in the circumflex      distribution.  2.  Preserved left ventricular systolic function.   PLAN:  To percutaneously revascularize this gentleman's circumflex coronary  artery.      JH/MEDQ  D:  11/28/2004  T:  11/28/2004  Job:  [id number]

## 2011-01-19 NOTE — H&P (Signed)
NAMEJAXDEN, [name] NO.:  [id number]   MEDICAL RECORD NO.:  [id number]          PATIENT TYPE:  INP   LOCATION:  3705                         FACILITY:  [facility]   PHYSICIAN:  [name], M.D.    DATE OF BIRTH:  06-28-1951   DATE OF ADMISSION:  11/04/2006  DATE OF DISCHARGE:                              HISTORY & PHYSICAL   PRIMARY CARE PHYSICIAN:  None.   CHIEF COMPLAINT:  Chest pain.   HISTORY OF PRESENT ILLNESS:  This is a 60 year old African-American male  with a history of an MI in March 2006, status post percutaneous  intervention with drug-eluting stents of two vessels who presented to  the emergency department with substernal chest pain that woke him up  from sleep this morning.  He states this is very similar to his previous  MI.  He describes it as someone standing on my chest, 9/10 severity,  down to 5/10 with nitroglycerin and currently 5/10.  He also received  aspirin at the same time he was given the nitroglycerin in the  ambulance.  He points at the mid left chest, as far as where his pain is  concerned, and states it does not radiate.  Nothing makes his pain  worse.   Concurrent with this, he has had dyspnea with exertion and cannot walk  to the door without dyspnea.  He has had this and has been progressive  for the past five months.  He denies edema.  History positive for  paroxysmal nocturnal dyspnea three times per week.  He has 2-3 pillow  orthopnea.  He has a dry cough occasionally.  He denies wheezing or  coughing blood.  He does have a sour brash daily.  Denies abdominal  pain, fevers, shaking chills.  States he has had diaphoresis this a.m.  He also admitted to a 10-pound weight loss.   His catheterization from March 2006 showed an ejection fraction of 50%  with mild hypokinesis of inferior, inferolateral, anterolateral, and  lateral walls.  He had two drug-eluting stents placed in the left  circumflex (100% down to 0% blockage)  and the AV branch of the  circumflex (90% down to 60% occlusion with the stent).  The patient  failed to take his medications and keep his appointments and he was  fired by [facility].   PAST MEDICAL HISTORY:  Coronary artery disease, status post myocardial  infarction March 2006.  He had two drug-eluting stents placed, as noted  above.   MEDICATIONS:  None.   ALLERGIES:  NO KNOWN DRUG ALLERGIES.   PAST SURGICAL HISTORY:  Back surgery which he states they took a disk  out nine to ten years ago.   SOCIAL HISTORY:  The patient is disabled due to back pain with footdrop  on the left side.  He is single with two kids, though they are not  living at home.  He lives in a room in house with his girlfriend.  He  smokes one pack per day.  He drinks alcohol every day, a 6-pack of  [employer], though states he has no  jitteriness and has never had  withdrawal symptoms without alcohol.  He denies cocaine.  History is  positive for marijuana.   FAMILY HISTORY:  Mom and dad died when the patient was 80 or 8 years old.  The patient is unsure of the cause, though states they did not pass away  at the same time.  He has four sisters and three brothers and to his  knowledge they are all healthy.  Both of his children are healthy as  well.   REVIEW OF SYSTEMS:  Please see HPI.   PHYSICAL EXAMINATION:  VITAL SIGNS:  Temperature 96.0, pulse 86, blood  pressure 91/56, respirations 18, pulse oximetry 98% on 100% FIO2 face  mask.  GENERAL:  No acute distress and no increased work of breathing.  HEENT:  Atraumatic.  Pupils pinpoint.  Extraocular movements intact.  Pharynx without erythema or exudate.  NECK:  Positive for JVD while flat.  Decreases when patient is elevated  to 30 degrees.  No lymphadenopathy.  No masses.  CARDIOVASCULAR:  Regular rate and rhythm.  No murmurs, rubs, or gallops.  LUNGS:  Bilateral rhonchi all fields which are expiratory.  No wheezes  or rales.  ABDOMEN:   Soft, nontender, nondistended.  No hepatosplenomegaly.  Bowel  sounds positive.  EXTREMITIES:  Warm.  Decreased bilateral DP pulses.  1+ PT pulses.  No  edema.  No lesions.  NEUROLOGICAL:  Cranial nerves II-XII grossly intact.  Sensation  decreased to light touch in the left lateral lower extremity into his  foot.  He is unable to dorsiflex his left foot at all.  Otherwise, his  upper extremities have 5/5 strength and his right lower extremity has  5/5 strength in all muscle groups.   LABS AND STUDIES:  White blood cell count 7.3, hemoglobin 13.1,  platelets 212, sodium 143, potassium 3.9, chloride 108, bicarbonate 28,  BUN 12, creatinine 1.21, glucose 52, calcium 8.9, protein 6.5, albumin  3.5, AST is too large, D-dimer negative.  BNP less than 30.  UDS  positive for cocaine.  Point of care enzymes negative x3.   Chest x-ray revealed bibasilar atelectasis versus pneumonia.   EKG:  LVH with no ST, T, or Q wave changes.  Regular rate and rhythm on  EKG.   ASSESSMENT AND PLAN:  A 60 year old male with:  1. Chest pain, likely unstable angina versus atypical chest pain,      versus spastic component with cocaine positive urine drug screen.      Would consider unstable angina most likely, given the patient's      risk factors of having had a coronary event in the past, and now      having chest pain at rest similar to previous chest pain.  We will      check cardiac markers x3.  His point of care enzymes are negative.      Will recheck an EKG in the morning for comparison, but this one      does not appear to have any signs of infarction.  Will also check a      TSH.  Chest x-ray was read as atelectasis versus infiltrate, but      the patient is afebrile, has normal white blood cell count, and      does not have any history suggestive of pneumonia, so we will not      treat unless the patient becomes febrile or develops symptoms.  He     does not have any  mediastinal widening and denied  radiation to the      back by my history to point to aortic dissection.  Start the      patient on daily aspirin and Protonix for his history of sour      brash, which may be gastroesophageal reflux disease.  In the      emergency department, he did not have any help with the GI      cocktail.  We will start heparin as well due to the fact that we      are considering unstable angina is high in the differential.      Cardiologist consulted in the emergency department and states      unless his troponins elevate, they will reevaluate the patient in      the morning - he will go for a catheterization if his troponins      increase.  With dyspnea on exertion over the past five months,      orthopnea, JVD, feel it is prudent to get an echo to evaluate his      heart function.  We will not start a beta-blocker due to his      cocaine use.  Will give him nitroglycerin and morphine p.r.n. for      pain.  2. Weight loss.  The patient reports a 10 pound weight loss since      Christmas.  His albumin is normal and states that he has been      eating fine.  He is not having lymphadenopathy or any findings on      chest x-ray to point to a mass.  His footdrop has been present      since surgery, so doubt a brain or any kind of central nervous      system lesion.  We will hemoccult his stools, as he has never had a      colonoscopy.  Will also check a PSA.  __________ possible cause and      we will get an echo.  TSH is pending as well.  Will consent the      patient for an HIV test.  He does not have any signs or symptoms of      tuberculosis.  3. Decreased peripheral pulses.  Will check ABIs.  4. Hypoglycemia.  The patient is asymptomatic.  Will check a CBG if he      has symptoms, otherwise will just get a morning BMP.  5. Footdrop.  Will attempt to obtain old records, though the patient      does not remember who his physicians are.  Get a PT/OT consult and      attempt to track down an old  lumbosacral x-ray.  6. Tobacco abuse.  Will get a smoking cessation consult.  7. Cocaine abuse.  Will get a social work consult.           ______________________________  [name], M.D.     SH/MEDQ  D:  11/06/2006  T:  11/07/2006  Job:  [id number]

## 2011-03-04 IMAGING — CR DG CHEST 2V
2 series · 2 of 2 positions shown · non-contrast
Comparison: 09/14/2008

CLINICAL DATA: Altered level conscious.  Chest pain.

CHEST - 2 VIEW

[t chest supine]
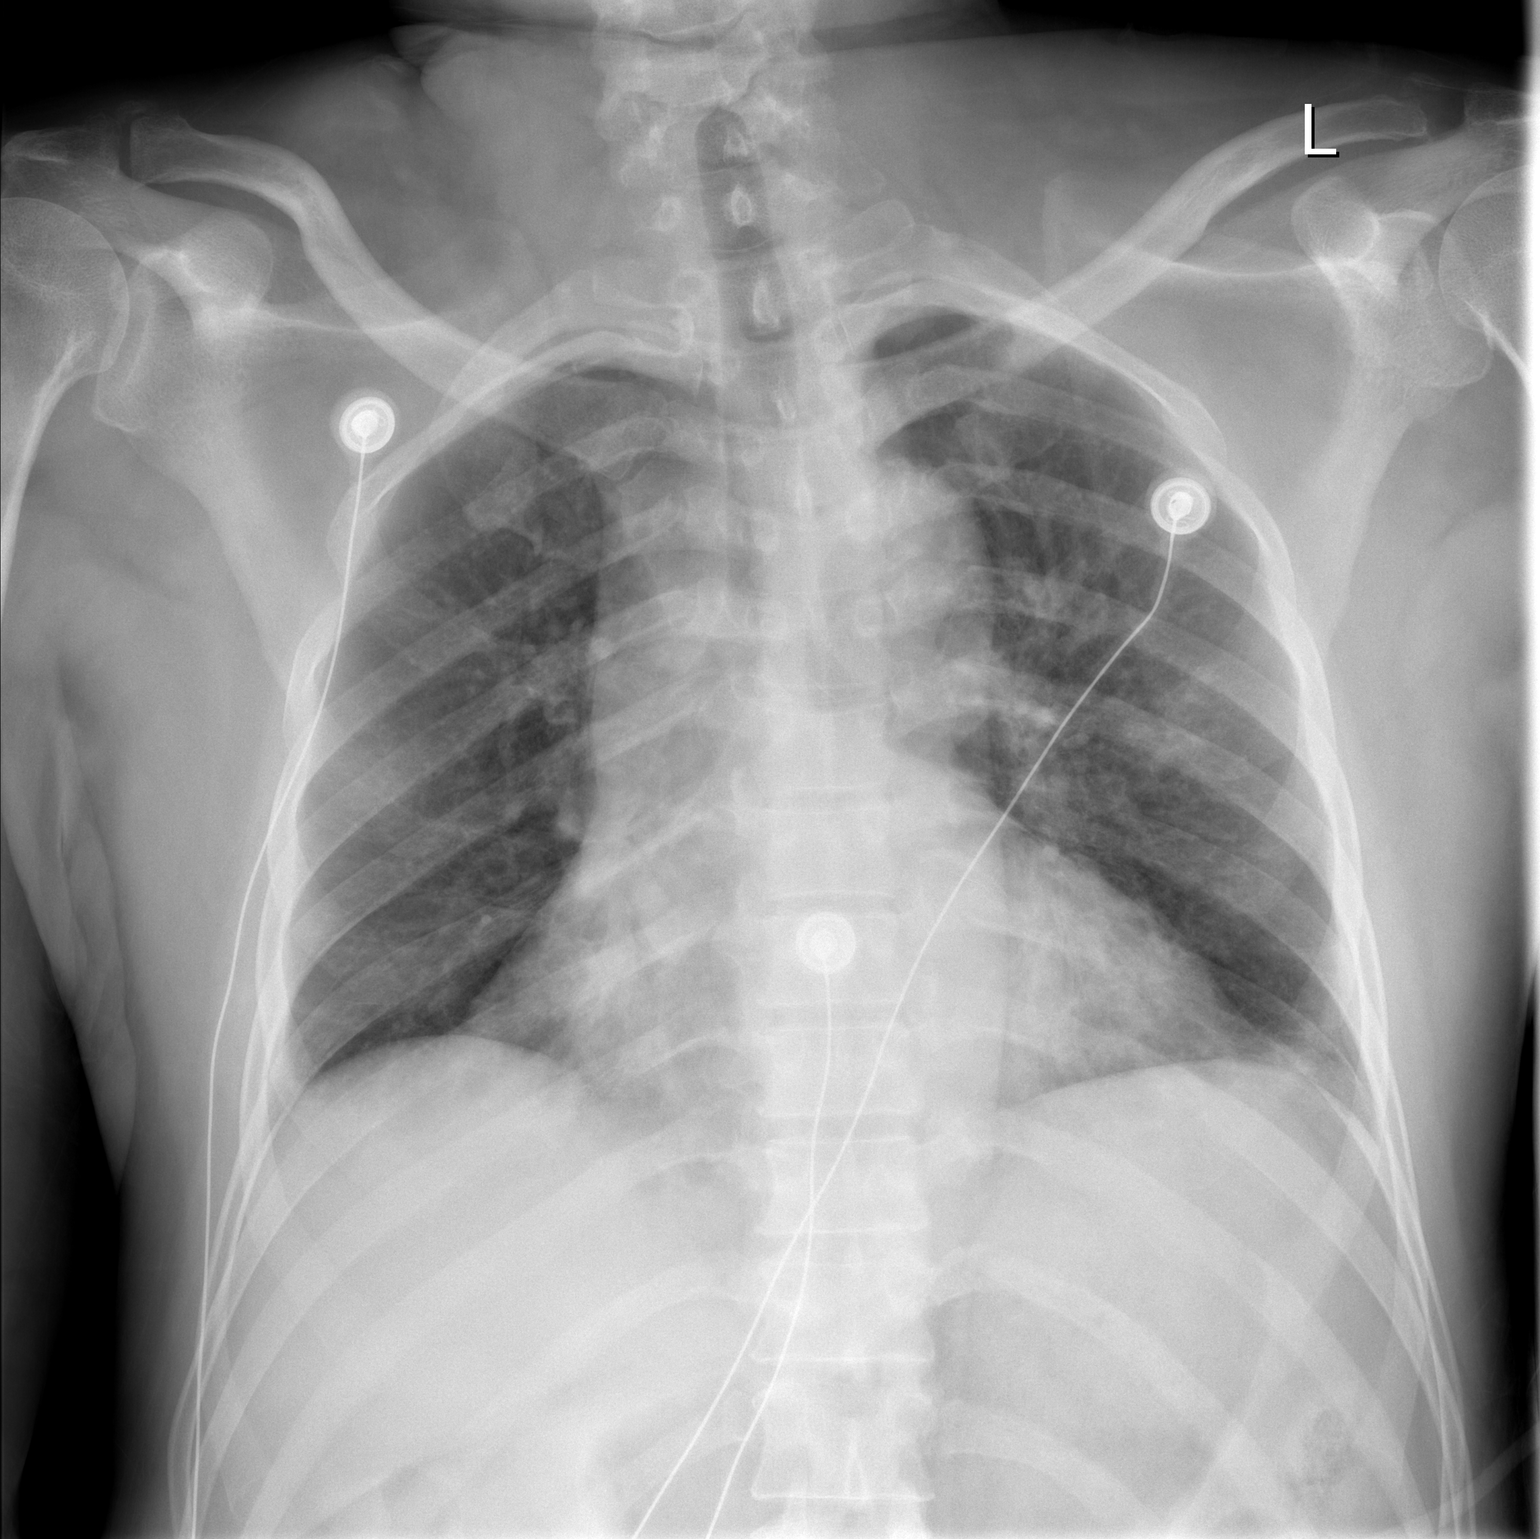

[w chest lat]
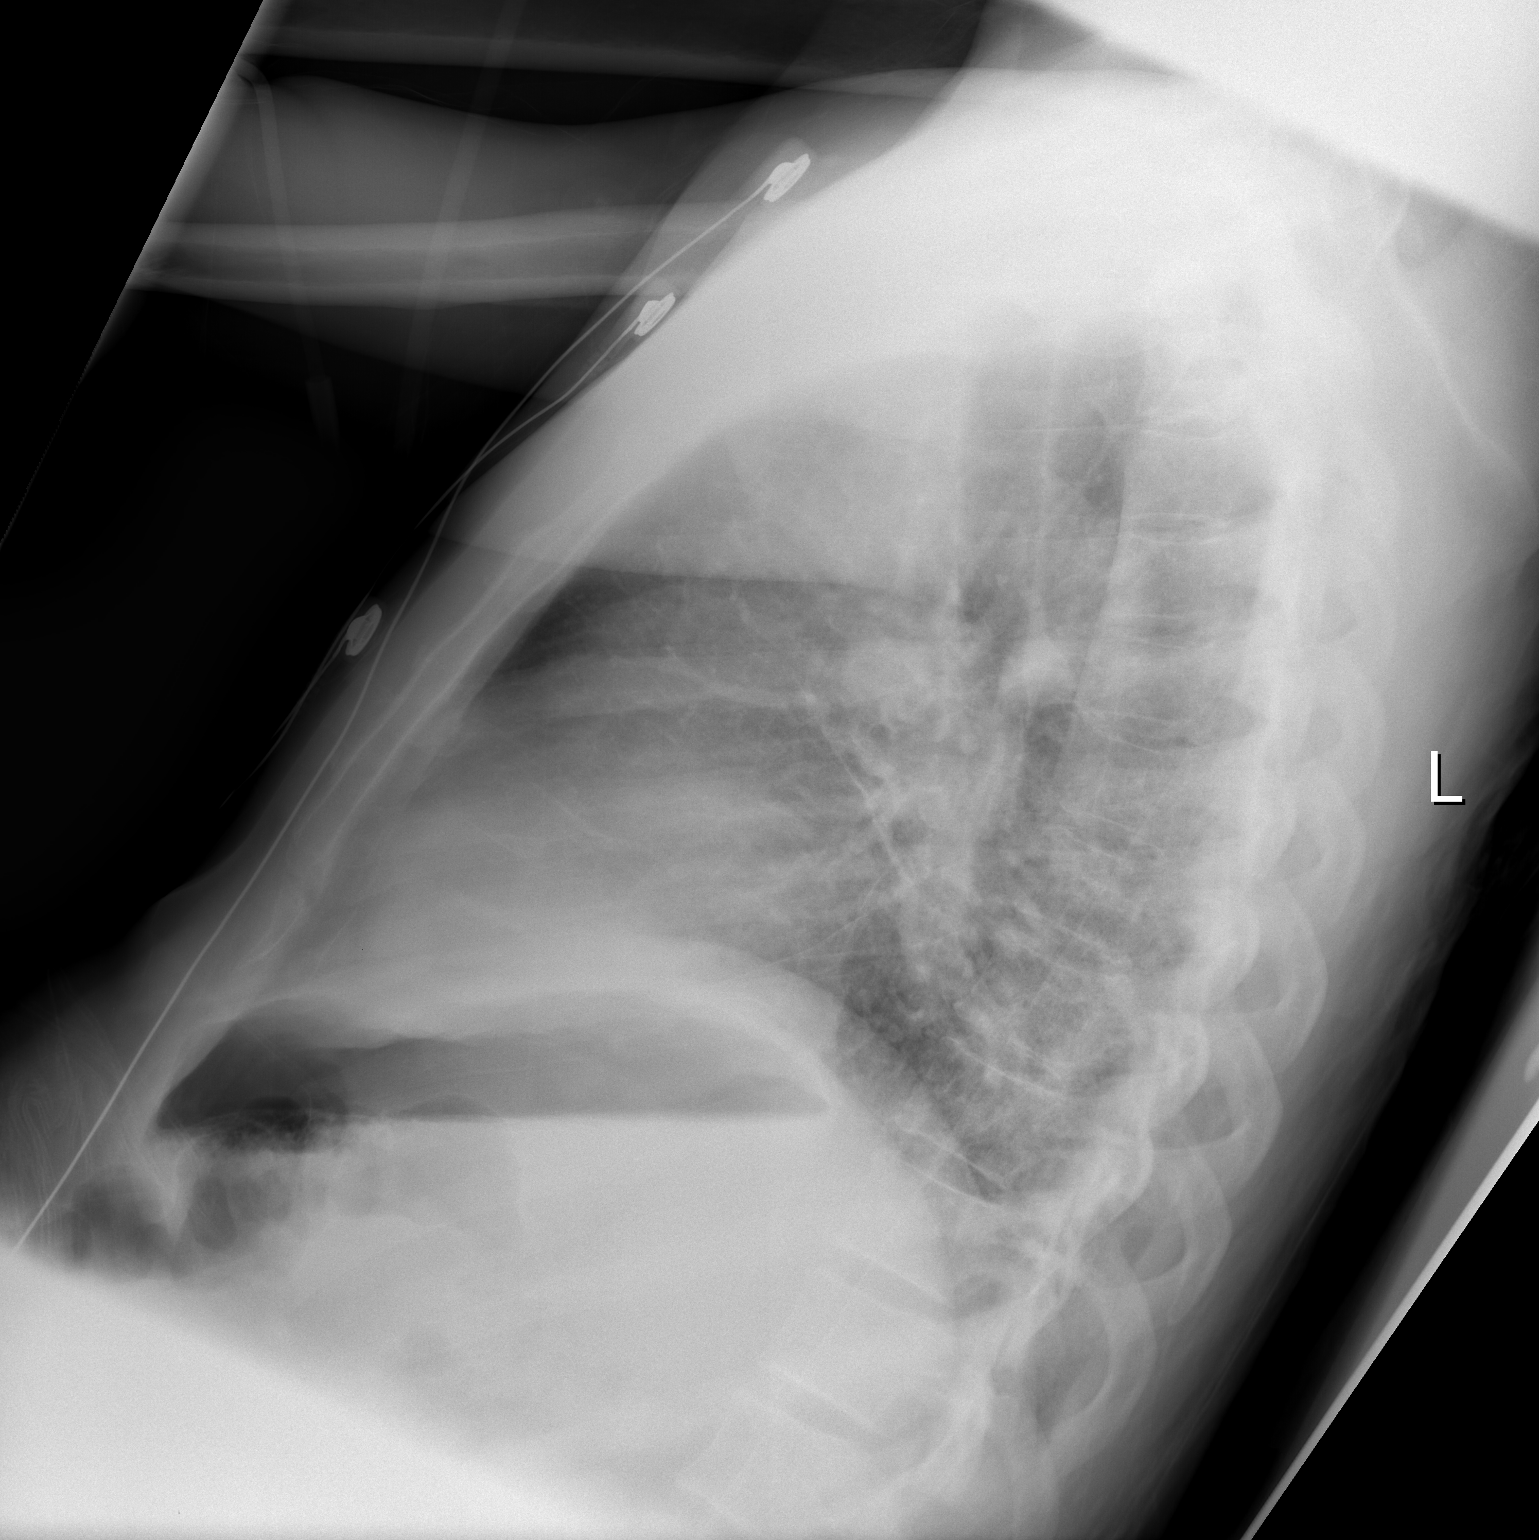

[2 of 2 positions shown; findings below may reference images not displayed]

FINDINGS: Low lung volumes are noted .  There is a mild asymmetric
ill-defined airspace opacity in the left midlung which appears new
and suspicious for pneumonia.

Cardiomegaly is not significantly changed allowing for decreased
lung volumes.
IMPRESSION: 1.  Mild asymmetric airspace opacity in the mid lung, suspicious
for pneumonia.
2.  Stable cardiomegaly.

## 2011-03-04 IMAGING — CT CT HEAD W/O CM
1 series · 16 of 30 positions shown, 20 images · non-contrast
Comparison: 09/17/2008

CLINICAL DATA: Altered level of consciousness.

CT HEAD WITHOUT CONTRAST
TECHNIQUE: Contiguous axial images were obtained from the base of
the skull through the vertex without contrast.

[Series 2: head_seq 4.5 h37s st · axial · 0.43mm/px · z∈[-168,-42]mm · 16 of 32 slices shown, 20 images]
[im 2/32  brain]
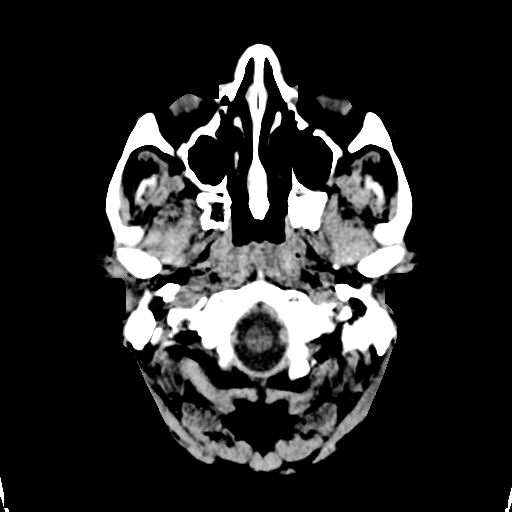
[im 2/32  bone]
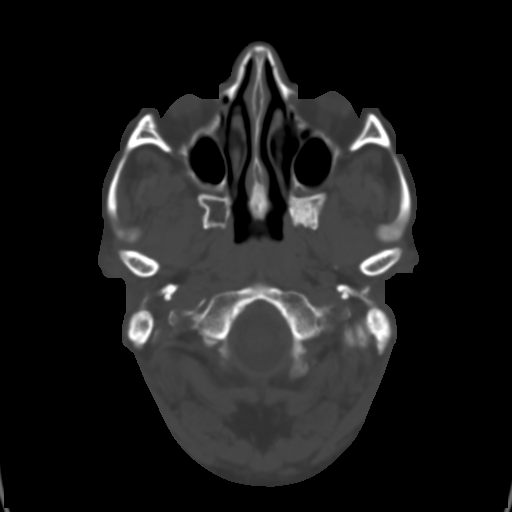
[im 4/32  brain]
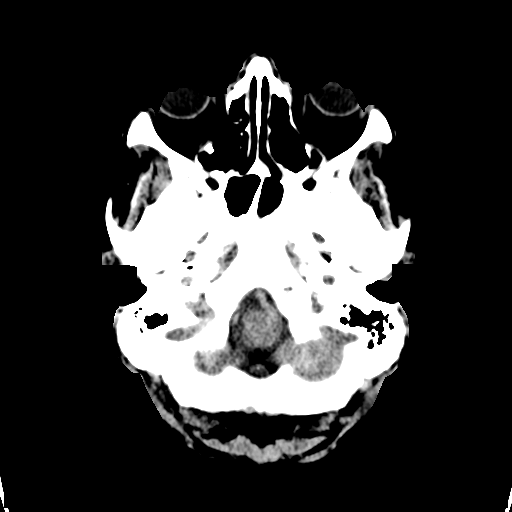
[im 6/32  brain]
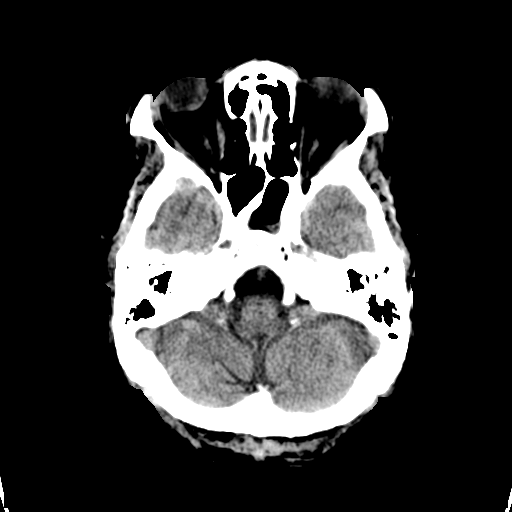
[im 8/32  brain]
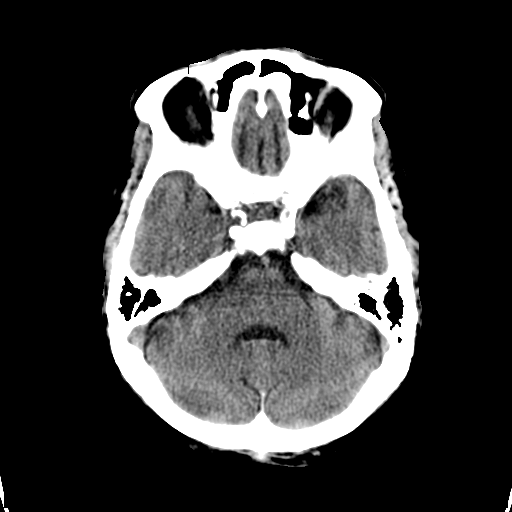
[im 9/32  brain]
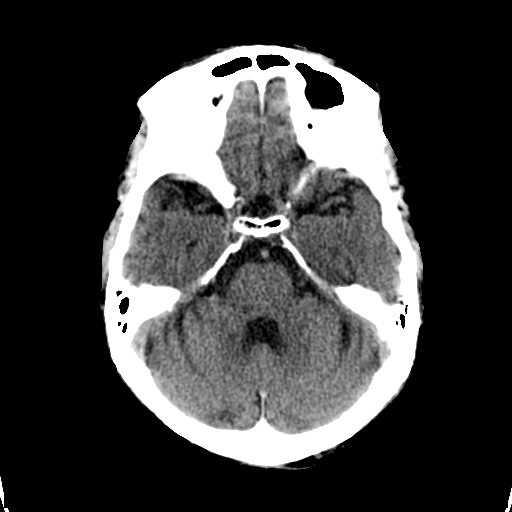
[im 9/32  bone]
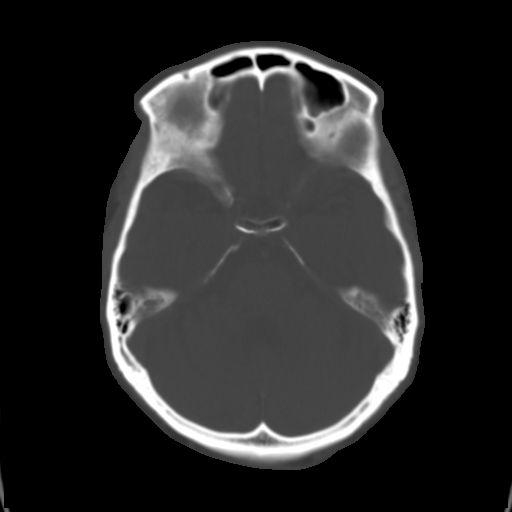
[im 11/32  brain]
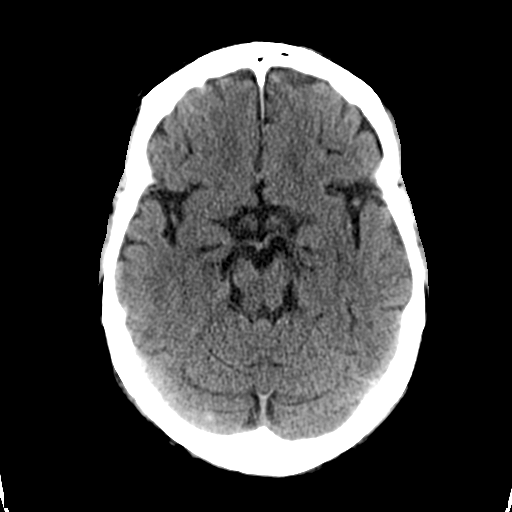
[im 13/32  brain]
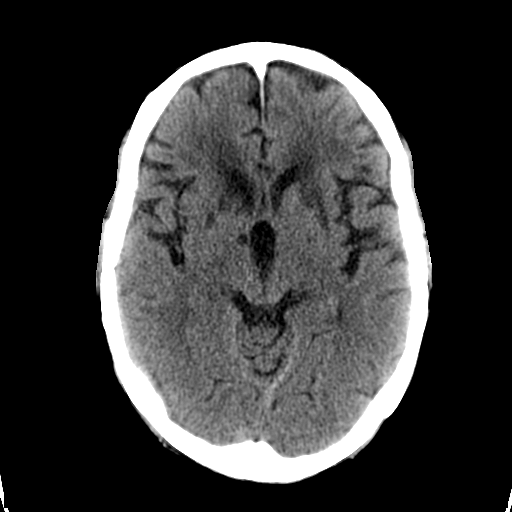
[im 15/32  brain]
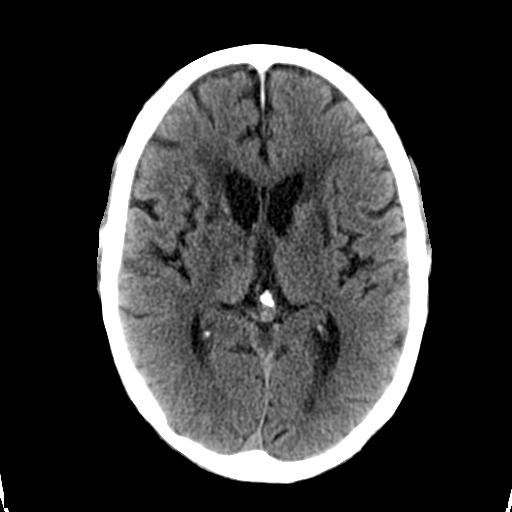
[im 17/32  brain]
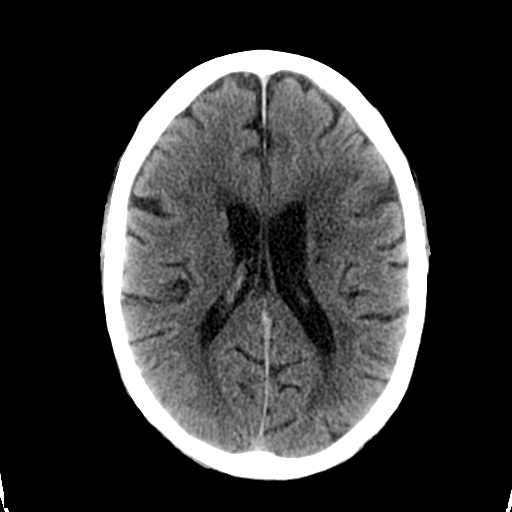
[im 17/32  bone]
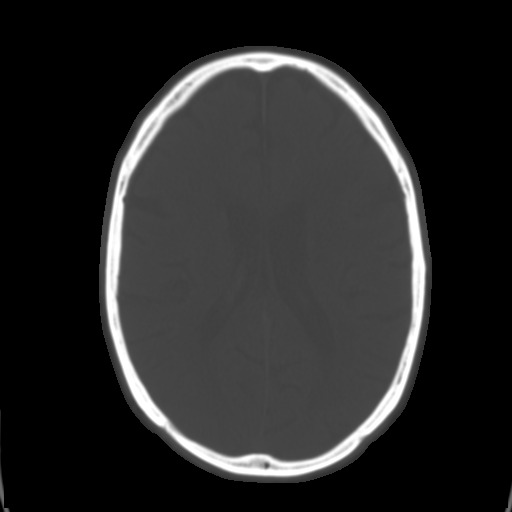
[im 19/32  brain]
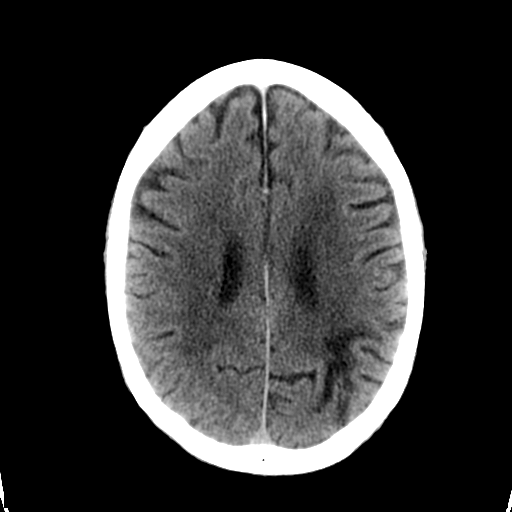
[im 21/32  brain]
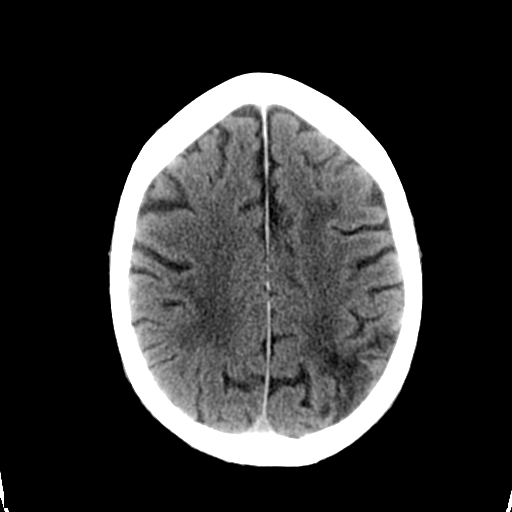
[im 23/32  brain]
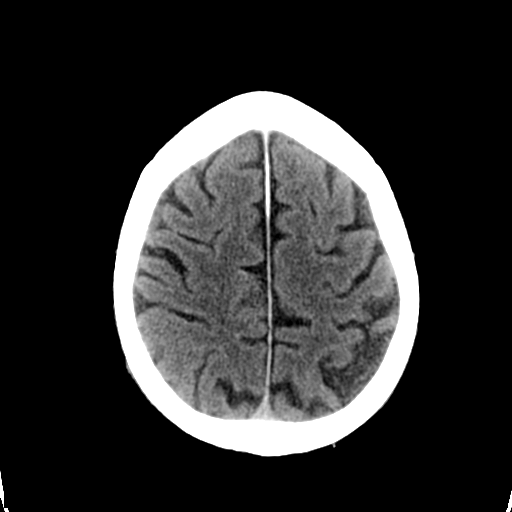
[im 24/32  brain]
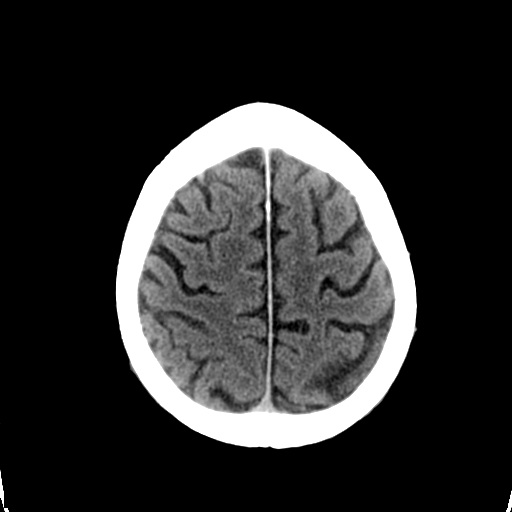
[im 24/32  bone]
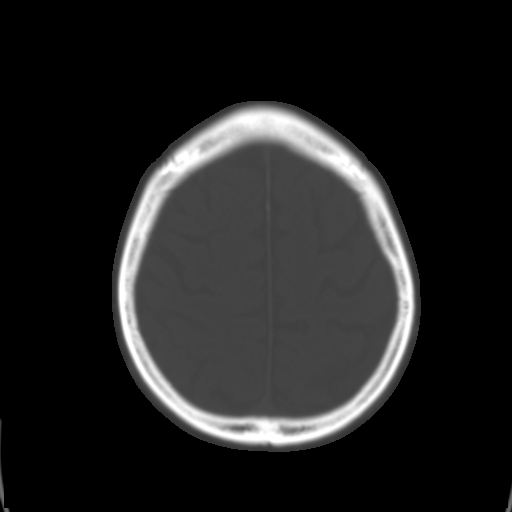
[im 26/32  brain]
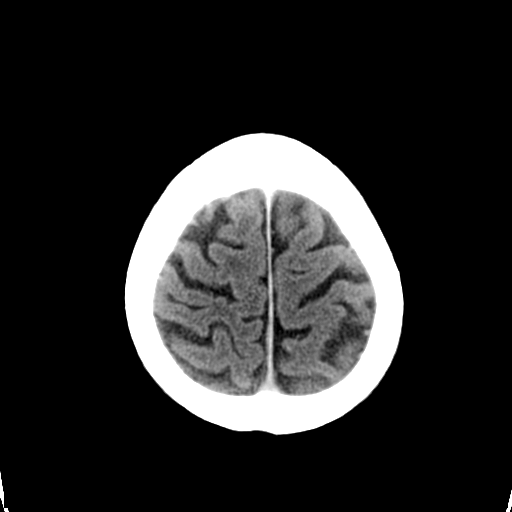
[im 28/32  brain]
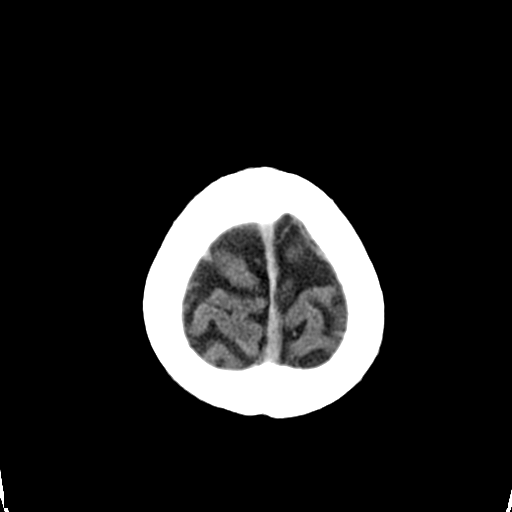
[im 30/32  brain]
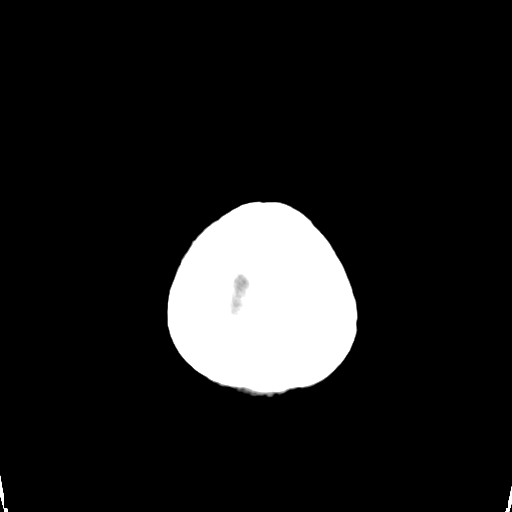

[16 of 30 positions shown; findings below may reference images not displayed]

FINDINGS: Moderate chronic small vessel white matter ischemic
changes are again noted.
Bilateral basal ganglia, right thalamic, and left parietal infarcts
are again noted.

No acute intracranial abnormalities are identified, including mass
lesion or mass effect, hydrocephalus, extra-axial fluid collection,
midline shift, hemorrhage, or acute infarction.  Please note that
acute infarction may be occult on CT for 24-48 hours.

The visualized bony calvarium is unremarkable.
Small amount of fluid in the left sphenoid sinus is noted.
IMPRESSION: No evidence of acute intracranial abnormality.

Left sphenoid sinusitis.

Chronic small vessel white matter ischemic changes and remote
infarcts.

## 2011-05-30 LAB — RAPID URINE DRUG SCREEN, HOSP PERFORMED
Amphetamines: NOT DETECTED
Barbiturates: NOT DETECTED
Benzodiazepines: NOT DETECTED
Cocaine: NOT DETECTED
Opiates: NOT DETECTED
Tetrahydrocannabinol: NOT DETECTED

## 2011-05-30 LAB — POCT I-STAT, CHEM 8
BUN: 16
Calcium, Ion: 1.19
Chloride: 109
Creatinine, Ser: 1.3
Glucose, Bld: 76
HCT: 44
Hemoglobin: 15
Potassium: 4.1
Sodium: 142
TCO2: 24

## 2011-05-30 LAB — POCT CARDIAC MARKERS
CKMB, poc: <1 — ABNORMAL LOW
CKMB, poc: <1 — ABNORMAL LOW
CKMB, poc: <1 — ABNORMAL LOW
Myoglobin, poc: 47.1
Myoglobin, poc: 55.7
Myoglobin, poc: 60.5
Operator id: 222501
Operator id: 234501
Operator id: 234501
Troponin i, poc: <0.05
Troponin i, poc: <0.05
Troponin i, poc: <0.05

## 2011-05-30 LAB — CBC
HCT: 41.5
Hemoglobin: 13.6
MCHC: 32.7
MCV: 84.8
Platelets: 245
RBC: 4.89
RDW: 15.9 — ABNORMAL HIGH
WBC: 5.4

## 2011-05-30 LAB — URINALYSIS, ROUTINE W REFLEX MICROSCOPIC
Bilirubin Urine: NEGATIVE
Glucose, UA: NEGATIVE
Hgb urine dipstick: NEGATIVE
Ketones, ur: NEGATIVE
Nitrite: NEGATIVE
Protein, ur: NEGATIVE
Specific Gravity, Urine: 1.021
Urobilinogen, UA: 0.2
pH: 7

## 2011-05-30 LAB — DIFFERENTIAL
Basophils Absolute: 0
Basophils Relative: 0
Eosinophils Absolute: 0
Eosinophils Relative: 1
Lymphocytes Relative: 42
Lymphs Abs: 2.3
Monocytes Absolute: 0.4
Monocytes Relative: 8
Neutro Abs: 2.6
Neutrophils Relative %: 49

## 2011-05-31 LAB — URINALYSIS, ROUTINE W REFLEX MICROSCOPIC
Bilirubin Urine: NEGATIVE
Glucose, UA: NEGATIVE
Hgb urine dipstick: NEGATIVE
Ketones, ur: NEGATIVE
Nitrite: NEGATIVE
Protein, ur: NEGATIVE
Specific Gravity, Urine: 1.019
Urobilinogen, UA: 0.2
pH: 5.5

## 2011-05-31 LAB — COMPREHENSIVE METABOLIC PANEL
ALT: 19
AST: 29
Albumin: 3.2 — ABNORMAL LOW
Alkaline Phosphatase: 48
BUN: 10
CO2: 26
Calcium: 8.9
Chloride: 107
Creatinine, Ser: 1.24
GFR calc Af Amer: >60
GFR calc non Af Amer: >60
Glucose, Bld: 85
Potassium: 3.8
Sodium: 137
Total Bilirubin: 0.7
Total Protein: 6

## 2011-05-31 LAB — DIFFERENTIAL
Basophils Absolute: 0
Basophils Relative: 1
Eosinophils Absolute: 0.1
Eosinophils Relative: 2
Lymphocytes Relative: 45
Lymphs Abs: 2.2
Monocytes Absolute: 0.5
Monocytes Relative: 10
Neutro Abs: 2.1
Neutrophils Relative %: 43

## 2011-05-31 LAB — LIPID PANEL
Cholesterol: 184
HDL: 45
LDL Cholesterol: 124 — ABNORMAL HIGH
Total CHOL/HDL Ratio: 4.1
Triglycerides: 76
VLDL: 15

## 2011-05-31 LAB — CBC
HCT: 39.9
HCT: 41.9
Hemoglobin: 13.1
Hemoglobin: 13.8
MCHC: 32.8
MCHC: 32.9
MCV: 85.3
MCV: 85.7
Platelets: 202
Platelets: 204
RBC: 4.66
RBC: 4.92
RDW: 16 — ABNORMAL HIGH
RDW: 16.7 — ABNORMAL HIGH
WBC: 5
WBC: 5

## 2011-05-31 LAB — BASIC METABOLIC PANEL
BUN: 8
CO2: 26
Calcium: 8.8
Chloride: 110
Creatinine, Ser: 1.15
GFR calc Af Amer: >60
GFR calc non Af Amer: >60
Glucose, Bld: 90
Potassium: 3.6
Sodium: 141

## 2011-05-31 LAB — TSH: TSH: 2.039

## 2011-05-31 LAB — PSA: PSA: 1.61

## 2012-06-29 ENCOUNTER — Observation Stay (HOSPITAL_COMMUNITY)
Admission: EM | Admit: 2012-06-29 | Discharge: 2012-07-01 | Disposition: A | Discharge status: Home / Self Care | Payer: Medicare Other | Attending: Internal Medicine | Admitting: Internal Medicine

## 2012-06-29 ENCOUNTER — Observation Stay (HOSPITAL_COMMUNITY): Payer: Medicare Other

## 2012-06-29 ENCOUNTER — Encounter (HOSPITAL_COMMUNITY): Payer: Self-pay

## 2012-06-29 ENCOUNTER — Emergency Department (HOSPITAL_COMMUNITY): Payer: Medicare Other

## 2012-06-29 DIAGNOSIS — I10 Essential (primary) hypertension: Principal | ICD-10-CM

## 2012-06-29 DIAGNOSIS — I739 Peripheral vascular disease, unspecified: Secondary | ICD-10-CM

## 2012-06-29 DIAGNOSIS — I2589 Other forms of chronic ischemic heart disease: Secondary | ICD-10-CM | POA: Insufficient documentation

## 2012-06-29 DIAGNOSIS — G9340 Encephalopathy, unspecified: Secondary | ICD-10-CM

## 2012-06-29 DIAGNOSIS — R55 Syncope and collapse: Secondary | ICD-10-CM

## 2012-06-29 DIAGNOSIS — F1490 Cocaine use, unspecified, uncomplicated: Secondary | ICD-10-CM | POA: Diagnosis present

## 2012-06-29 DIAGNOSIS — Z23 Encounter for immunization: Secondary | ICD-10-CM | POA: Insufficient documentation

## 2012-06-29 DIAGNOSIS — I509 Heart failure, unspecified: Secondary | ICD-10-CM | POA: Insufficient documentation

## 2012-06-29 DIAGNOSIS — F1410 Cocaine abuse, uncomplicated: Secondary | ICD-10-CM

## 2012-06-29 DIAGNOSIS — I2510 Atherosclerotic heart disease of native coronary artery without angina pectoris: Secondary | ICD-10-CM

## 2012-06-29 HISTORY — DX: Essential (primary) hypertension: I10

## 2012-06-29 HISTORY — DX: Peripheral vascular disease, unspecified: I73.9

## 2012-06-29 HISTORY — DX: Pure hypercholesterolemia, unspecified: E78.00

## 2012-06-29 HISTORY — DX: Blindness, one eye, unspecified eye: H54.40

## 2012-06-29 HISTORY — DX: Cerebral infarction, unspecified: I63.9

## 2012-06-29 HISTORY — DX: Atherosclerotic heart disease of native coronary artery without angina pectoris: I25.10

## 2012-06-29 HISTORY — DX: Acute myocardial infarction, unspecified: I21.9

## 2012-06-29 HISTORY — DX: Heart failure, unspecified: I50.9

## 2012-06-29 LAB — COMPREHENSIVE METABOLIC PANEL
ALT: 26 U/L (ref 0–53)
AST: 45 U/L — ABNORMAL HIGH (ref 0–37)
Albumin: 3.3 g/dL — ABNORMAL LOW (ref 3.5–5.2)
Alkaline Phosphatase: 59 U/L (ref 39–117)
BUN: 13 mg/dL (ref 6–23)
CO2: 22 mEq/L (ref 19–32)
Calcium: 9.5 mg/dL (ref 8.4–10.5)
Chloride: 106 mEq/L (ref 96–112)
Creatinine, Ser: 1.3 mg/dL (ref 0.50–1.35)
GFR calc Af Amer: 67 mL/min — ABNORMAL LOW (ref >90)
GFR calc non Af Amer: 58 mL/min — ABNORMAL LOW (ref >90)
Glucose, Bld: 90 mg/dL (ref 70–99)
Potassium: 5.1 mEq/L (ref 3.5–5.1)
Sodium: 137 mEq/L (ref 135–145)
Total Bilirubin: 0.3 mg/dL (ref 0.3–1.2)
Total Protein: 7.1 g/dL (ref 6.0–8.3)

## 2012-06-29 LAB — TROPONIN I
Troponin I: <0.3 ng/mL (ref <0.30)
Troponin I: <0.3 ng/mL (ref <0.30)
Troponin I: <0.3 ng/mL (ref <0.30)

## 2012-06-29 LAB — POCT I-STAT, CHEM 8
BUN: 13 mg/dL (ref 6–23)
Calcium, Ion: 1.16 mmol/L (ref 1.13–1.30)
Chloride: 109 mEq/L (ref 96–112)
Creatinine, Ser: 1.3 mg/dL (ref 0.50–1.35)
Glucose, Bld: 89 mg/dL (ref 70–99)
HCT: 49 % (ref 39.0–52.0)
Hemoglobin: 16.7 g/dL (ref 13.0–17.0)
Potassium: 3.8 mEq/L (ref 3.5–5.1)
Sodium: 144 mEq/L (ref 135–145)
TCO2: 22 mmol/L (ref 0–100)

## 2012-06-29 LAB — POCT I-STAT TROPONIN I
Troponin i, poc: 0.02 ng/mL (ref 0.00–0.08)
Troponin i, poc: 0.01 ng/mL (ref 0.00–0.08)

## 2012-06-29 LAB — CBC
HCT: 45.1 % (ref 39.0–52.0)
Hemoglobin: 15.7 g/dL (ref 13.0–17.0)
MCH: 28 pg (ref 26.0–34.0)
MCHC: 34.8 g/dL (ref 30.0–36.0)
MCV: 80.5 fL (ref 78.0–100.0)
Platelets: 199 10*3/uL (ref 150–400)
RBC: 5.6 MIL/uL (ref 4.22–5.81)
RDW: 15.4 % (ref 11.5–15.5)
WBC: 8.8 10*3/uL (ref 4.0–10.5)

## 2012-06-29 LAB — MAGNESIUM: Magnesium: 2.1 mg/dL (ref 1.5–2.5)

## 2012-06-29 LAB — PROTIME-INR
INR: 0.91 (ref 0.00–1.49)
Prothrombin Time: 12.2 seconds (ref 11.6–15.2)

## 2012-06-29 LAB — APTT: aPTT: 26 seconds (ref 24–37)

## 2012-06-29 MED ORDER — ONDANSETRON HCL 4 MG PO TABS: 4.0000 mg | ORAL_TABLET | Freq: Four times a day (QID) | ORAL | Status: DC | PRN

## 2012-06-29 MED ORDER — SODIUM CHLORIDE 0.9 % IV SOLN
INTRAVENOUS | Status: AC
  Administered 2012-06-29: 11:00:00 via INTRAVENOUS

## 2012-06-29 MED ORDER — HYDROCODONE-ACETAMINOPHEN 5-325 MG PO TABS: 1.0000 | ORAL_TABLET | ORAL | Status: DC | PRN

## 2012-06-29 MED ORDER — ASPIRIN 81 MG PO CHEW
324.0000 mg | CHEWABLE_TABLET | Freq: Once | ORAL | Status: AC
  Administered 2012-06-29: 324 mg via ORAL
  Filled 2012-06-29: qty 4

## 2012-06-29 MED ORDER — ONDANSETRON HCL 4 MG/2ML IJ SOLN: 4.0000 mg | Freq: Four times a day (QID) | INTRAMUSCULAR | Status: DC | PRN

## 2012-06-29 MED ORDER — ASPIRIN EC 81 MG PO TBEC
81.0000 mg | DELAYED_RELEASE_TABLET | Freq: Every day | ORAL | Status: DC
  Administered 2012-06-29 – 2012-07-01 (×3): 81 mg via ORAL
  Filled 2012-06-29 (×3): qty 1

## 2012-06-29 MED ORDER — SODIUM CHLORIDE 0.9 % IJ SOLN
3.0000 mL | Freq: Two times a day (BID) | INTRAMUSCULAR | Status: DC
  Administered 2012-06-29: 3 mL via INTRAVENOUS

## 2012-06-29 MED ORDER — ACETAMINOPHEN 650 MG RE SUPP: 650.0000 mg | Freq: Four times a day (QID) | RECTAL | Status: DC | PRN

## 2012-06-29 MED ORDER — DOCUSATE SODIUM 100 MG PO CAPS
100.0000 mg | ORAL_CAPSULE | Freq: Two times a day (BID) | ORAL | Status: DC
  Administered 2012-06-29 – 2012-07-01 (×5): 100 mg via ORAL
  Filled 2012-06-29 (×6): qty 1

## 2012-06-29 MED ORDER — TRAMADOL HCL 50 MG PO TABS
50.0000 mg | ORAL_TABLET | Freq: Four times a day (QID) | ORAL | Status: DC | PRN
  Filled 2012-06-29: qty 1

## 2012-06-29 MED ORDER — HYDRALAZINE HCL 20 MG/ML IJ SOLN
10.0000 mg | Freq: Four times a day (QID) | INTRAMUSCULAR | Status: DC | PRN
  Administered 2012-06-30: 10 mg via INTRAVENOUS
  Filled 2012-06-29 (×2): qty 0.5

## 2012-06-29 MED ORDER — ATORVASTATIN CALCIUM 20 MG PO TABS
20.0000 mg | ORAL_TABLET | Freq: Every day | ORAL | Status: DC
Start: 2012-06-29 — End: 2012-07-01
  Administered 2012-06-29 – 2012-07-01 (×3): 20 mg via ORAL
  Filled 2012-06-29 (×3): qty 1

## 2012-06-29 MED ORDER — ACETAMINOPHEN 325 MG PO TABS: 650.0000 mg | ORAL_TABLET | Freq: Four times a day (QID) | ORAL | Status: DC | PRN

## 2012-06-29 NOTE — Evaluation (Signed)
Clinical/Bedside Swallow Evaluation Patient Details  Name: [name] MRN: [id number] Date of Birth: 01-Jun-1951  Today's Date: 06/29/2012 Time: 1400-1430 SLP Time Calculation (min): 30 min  Past Medical History:  Past Medical History  Diagnosis Date  . Hypertension   . CHF (congestive heart failure)   . Coronary artery disease   . CVA (cerebral infarction)    Past Surgical History:  Past Surgical History  Procedure Date  . Cardiac catheterization   . Coronary artery bypass graft    HPI:  61 y/o male who has a past medical history of Hypertension; CHF (congestive heart failure); and Coronary artery disease.   Wife found him unresponsive in a chair. Attempted to give nitroglycerine but unsuccessful. He has hx of the same presentation with prior MI. Denies any chest pain. 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 No new weakness reported by the wife.  Per H&P patient admitted to doing cocaine yesterday and have been smoking cigarette few minutes prior to the event. No seizure like activity noted.  Patient referred for BSE to assess risk for aspiration due PMH of stroke.     Assessment / Plan / Recommendation Clinical Impression  Oropharyngeal swallow functional for regular consistency and thin liquids.  No outward s/s of aspiration noted. Mastication affected by lack of dentition.  Mastication extended but effective.  Recommend to proceed with regular consistency as tolerated and thin liquids with full supervision due to noted decreased safety awareness.  Recommend oral care QID due to severe decay on bottom row.  ST to sign off as education complete.      Aspiration Risk  Mild    Diet Recommendation Regular;Thin liquid   Liquid Administration via: Cup;Straw Medication Administration: Whole meds with liquid Supervision: Patient able to self feed;Full supervision/cueing for compensatory strategies Postural Changes and/or Swallow Maneuvers:  Out of bed for meals;Seated upright 90 degrees;Upright 30-60 min after meal    Other  Recommendations Oral Care Recommendations: Oral care QID Other Recommendations: Clarify dietary restrictions   Follow Up Recommendations                   Swallow Study Prior Functional Status   Lived at home with spouse    General Date of Onset: 06/28/12 HPI: 61 y/o male who has a past medical history of Hypertension; CHF (congestive heart failure); and Coronary artery disease.   Wife found him unresponsive in a chair. Attempted to give nitroglycerine but unsuccessful. He has hx of the same presentation with prior MI. Denies any chest pain. 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 No new weakness reported by the wife.  Per H&P patient admitted to doing cocaine yesterday and have been smoking cigarette few minutes prior to the event. No seizure like activity noted.   Type of Study: Bedside swallow evaluation Diet Prior to this Study: NPO Temperature Spikes Noted: No Respiratory Status: Room air History of Recent Intubation: No Behavior/Cognition: Alert;Pleasant mood;Confused;Impulsive;Distractible;Decreased sustained attention Oral Cavity - Dentition: Poor condition;Missing dentition Self-Feeding Abilities: Needs assist Patient Positioning: Upright in chair Baseline Vocal Quality: Clear Volitional Cough: Strong Volitional Swallow: Able to elicit    Oral/Motor/Sensory Function Overall Oral Motor/Sensory Function: Appears within functional limits for tasks assessed   Ice Chips Ice chips: Within functional limits   Thin Liquid Thin Liquid: Within functional limits Presentation: Cup;Straw;Spoon    Nectar Thick Nectar Thick Liquid: Not tested   Honey Thick  Honey Thick Liquid: Not tested   Puree Puree: Within functional limits Presentation: Spoon   Solid   GO    Solid: Impaired Other Comments: mastication extended but effective      Sharman Crate  [place], [facility] 06/29/2012,6:08 PM

## 2012-06-29 NOTE — ED Notes (Signed)
Pt and wife updated on plan of care: aware that 3 sets of cardiac markers have been ordered. They will be drawn at 3 hour intervals. The next set will be drawn at approx 0600. Verbalized understanding for delay. Advised to keep pt NPO. No further questions at this time.

## 2012-06-29 NOTE — ED Notes (Signed)
Per Wife: Pt was sitting at the table eating a sandwich and "passed out". Pt remained in the chair. No injury to the head. Pt Denies dizziness. Denies headache. Denies pain. Denies SOB. Denies N/V/D/C. No complaints at this time. Skin cool to the touch. Vitals stable. Wife at bedside.

## 2012-06-29 NOTE — ED Provider Notes (Signed)
History     CSN: [id number]  Arrival date & time 06/29/12  0002   First MD Initiated Contact with Patient 06/29/12 0435      Chief Complaint  Patient presents with  . Loss of Consciousness    (Consider location/radiation/quality/duration/timing/severity/associated sxs/prior treatment) HPI [name] is a 61 year old man with history of ischemic cardiomyopathy-EF 45% in 2011, history of MI in 2011 followed by CABG PAD, CVA. Brought to ED via EMS from home after syncope witnessed by neighbor and wife. Pt had been sitting at dinner table. Slumped into chair, eyes half closed, held up in chair by arms of the chair. No unusual motor activity. No response to verbal or tactile stimulus from wife. Event lasted approx 82m and then the patient regained normal LOC. Pt does not recall the event.  Denies experiencing cp or sob. Chronically incontinent of urine.   Wife says he has not had any recent med changes. Interestingly, he has one previous episode of syncope and that was in the setting of a painless MI in 2011.  Patient admits to recent crack cocaine use. Initially said repeatedly, "I don't know" when asked about last use. However, when asked, specifically, if he used yesterday (Sat, Oct 26) patient endorsed. Wife says patient had consumed a single 12 oz beer in hour leading up to episode.    Past Medical History  Diagnosis Date  . Hypertension     History reviewed. No pertinent past surgical history.  History reviewed. No pertinent family history.  History  Substance Use Topics  . Smoking status: Current Some Day Smoker  . Smokeless tobacco: Not on file  . Alcohol Use: 0.0 oz/week    2-3 Cans of beer per week      Review of Systems Gen: no weight loss, fevers, chills, night sweats Eyes: no discharge or drainage, no occular pain or visual changes Nose: no epistaxis or rhinorrhea Mouth: no dental pain, no sore throat Neck: no neck pain Lungs: no SOB, cough, wheezing CV: no  chest pain, palpitations, dependent edema or orthopnea Abd: no abdominal pain, nausea, vomiting GU: no dysuria or gross hematuria MSK: no myalgias or arthralgias Neuro: As per history of present illness, otherwise negative Skin: no rash Psyche: negative.  Allergies  Review of patient's allergies indicates no known allergies.  Home Medications   Current Outpatient Rx  Name Route Sig Dispense Refill  . ASPIRIN EC 81 MG PO TBEC Oral Take 81 mg by mouth daily.    . ATORVASTATIN CALCIUM 20 MG PO TABS Oral Take 20 mg by mouth daily.    [name] LISINOPRIL 10 MG PO TABS Oral Take 10 mg by mouth daily.    [name] POTASSIUM CHLORIDE CRYS ER 20 MEQ PO TBCR Oral Take 20 mEq by mouth 2 (two) times daily.    . TRAMADOL HCL 50 MG PO TABS Oral Take 50 mg by mouth every 6 (six) hours as needed. For pain      BP 119/83  Pulse 83  Temp 97.6 F (36.4 C) (Oral)  Resp 18  SpO2 98%  Physical Exam Gen: well developed and well nourished appearing, sleeping on gurney but easily arousable, and oriented x3. Head: NCAT Eyes: PERL, EOMI Nose: no epistaixis or rhinorrhea Mouth/throat: mucosa is moist and pink Neck: supple, no stridor Lungs: CTA B, no wheezing, rhonchi or rales Vascular: Regular rate and rhythm, good radial pulses bilaterally Abd: soft, notender, nondistended Back: no ttp, no cva ttp Skin: no rash Neuro: CN ii-xii grossly intact,  left leg weakness-chronic per wife, patient nonambulatory at baseline Psyche; normal affect,  calm and cooperative.   ED Course  Procedures (including critical care time)  Results for orders placed during the hospital encounter of 06/29/12 (from the past 24 hour(s))  POCT I-STAT TROPONIN I     Status: Normal   Collection Time   06/29/12  2:54 AM      Component Value Range   Troponin i, poc 0.02  0.00 - 0.08 ng/mL   Comment 3           POCT I-STAT, CHEM 8     Status: Normal   Collection Time   06/29/12  2:56 AM      Component Value Range   Sodium 144  135 - 145  mEq/L   Potassium 3.8  3.5 - 5.1 mEq/L   Chloride 109  96 - 112 mEq/L   BUN 13  6 - 23 mg/dL   Creatinine, Ser 1.30  0.50 - 1.35 mg/dL   Glucose, Bld 89  70 - 99 mg/dL   Calcium, Ion 1.16  1.13 - 1.30 mmol/L   TCO2 22  0 - 100 mmol/L   Hemoglobin 16.7  13.0 - 17.0 g/dL   HCT 49.0  39.0 - 52.0 %  CBC     Status: Normal   Collection Time   06/29/12  3:00 AM      Component Value Range   WBC 8.8  4.0 - 10.5 K/uL   RBC 5.60  4.22 - 5.81 MIL/uL   Hemoglobin 15.7  13.0 - 17.0 g/dL   HCT 45.1  39.0 - 52.0 %   MCV 80.5  78.0 - 100.0 fL   MCH 28.0  26.0 - 34.0 pg   MCHC 34.8  30.0 - 36.0 g/dL   RDW 15.4  11.5 - 15.5 %   Platelets 199  150 - 400 K/uL  COMPREHENSIVE METABOLIC PANEL     Status: Abnormal   Collection Time   06/29/12  3:00 AM      Component Value Range   Sodium 137  135 - 145 mEq/L   Potassium 5.1  3.5 - 5.1 mEq/L   Chloride 106  96 - 112 mEq/L   CO2 22  19 - 32 mEq/L   Glucose, Bld 90  70 - 99 mg/dL   BUN 13  6 - 23 mg/dL   Creatinine, Ser 1.30  0.50 - 1.35 mg/dL   Calcium 9.5  8.4 - 10.5 mg/dL   Total Protein 7.1  6.0 - 8.3 g/dL   Albumin 3.3 (*) 3.5 - 5.2 g/dL   AST 45 (*) 0 - 37 U/L   ALT 26  0 - 53 U/L   Alkaline Phosphatase 59  39 - 117 U/L   Total Bilirubin 0.3  0.3 - 1.2 mg/dL   GFR calc non Af Amer 58 (*) >90 mL/min   GFR calc Af Amer 67 (*) >90 mL/min  MAGNESIUM     Status: Normal   Collection Time   06/29/12  3:00 AM      Component Value Range   Magnesium 2.1  1.5 - 2.5 mg/dL  PROTIME-INR     Status: Normal   Collection Time   06/29/12  3:00 AM      Component Value Range   Prothrombin Time 12.2  11.6 - 15.2 seconds   INR 0.91  0.00 - 1.49  APTT     Status: Normal   Collection Time   06/29/12  3:00 AM      Component Value Range   aPTT 26  24 - 37 seconds  POCT I-STAT TROPONIN I     Status: Normal   Collection Time   06/29/12  5:22 AM      Component Value Range   Troponin i, poc 0.01  0.00 - 0.08 ng/mL   Comment 3            EKG: nsr, no  acute ischemic changes, normal intervals, normal axis, normal qrs complex, non specific ST-T wave changes no change from most recent.  Dg Chest Portable 1 View  06/29/2012  *RADIOLOGY REPORT*  Clinical Data: Loss of consciousness.  Non responsive.  PORTABLE CHEST - 1 VIEW  Comparison: 11/24/2009  Findings: Stable postoperative changes in the mediastinum.  Shallow inspiration.  Linear atelectasis in the left lung base.  Normal heart size and pulmonary vascularity.  No focal airspace consolidation in the lungs.  No blunting of costophrenic angles. No pneumothorax.  Calcified and tortuous aorta.  IMPRESSION: Shallow inspiration with linear atelectasis in the left lung base.   Original Report Authenticated By: Neale Burly, M.D.    DDX: cardiogenic syncope, seizure, intoxication, anemia, electrolyte disturbance.   ED work up non-diagnostic. Pt with normal BP and NSR on monitor. Normal sats on RA. No ischemic changes on EKG. First tpn is wnl. , Mg wnl, CKD with Cr at baseline. H/H wnl.   Main concern is for cardiogenic syncope in light of PMH and unprovoked sx.  Case discussed with Dr. Arnoldo Morale who has agreed to admit the patient for further evaluation and monitoring.       MDM  See above.         Elyn Peers, MD 06/29/12 [phone]

## 2012-06-29 NOTE — ED Notes (Signed)
Per EMS:  Family witnessed syncopal episode lasting approx 2-4 min while at the dinner table.  Remained up right in chair. Family put 1  Nitro under tongue.  20 G LAC. Vitals stable. Give 500cc bolus. 2nd bag hanging now. A.O. X 4. NAD. Denies pain. No other complaints. Reports hxt of hypertension and CABG. Family at bedside.

## 2012-06-29 NOTE — ED Notes (Signed)
Radiology at bedside

## 2012-06-29 NOTE — Progress Notes (Signed)
*  PRELIMINARY RESULTS* Vascular Ultrasound Carotid Duplex (Doppler) has been completed.   The left internal carotid artery demonstrates elevated velocities in the upper 60-79% range of stenosis. The left external carotid artery also demonstrates elevated velocities, suggestive of stenosis. Vertebral arteries are patent with antegrade flow.  06/29/2012 12:08 PM Maudry Mayhew, RDMS, RDCS

## 2012-06-29 NOTE — Progress Notes (Signed)
Patient ID: [name]  male  [id number]    DOB: 1951-07-30    DOA: 06/29/2012  PCP: No primary provider on file.  Subjective: Patient seen earlier this morning, no specific complaints at the time. No chest pain, shortness of breath, nausea, vomiting, abdominal pain  Objective: Weight change:   Intake/Output Summary (Last 24 hours) at 06/29/12 1414 Last data filed at 06/29/12 1300  Gross per 24 hour  Intake      0 ml  Output      0 ml  Net      0 ml   Blood pressure 157/103, pulse 72, temperature 98.1 F (36.7 C), temperature source Oral, resp. rate 18, height 5\' 9"  (1.753 m), weight 92.126 kg (203 lb 1.6 oz), SpO2 100.00%.  Physical Exam: General: Alert and awake, oriented x3, not in any acute distress. HEENT: anicteric sclera, pupils reactive to light and accommodation, EOMI CVS: S1-S2 clear, no murmur rubs or gallops Chest: clear to auscultation bilaterally, no wheezing, rales or rhonchi Abdomen: soft nontender, nondistended, normal bowel sounds, no organomegaly Extremities: no cyanosis, clubbing or edema noted bilaterally   Lab Results: Basic Metabolic Panel:  Lab [id number] 0300 06/29/12 0256  NA 137 144  K 5.1 3.8  CL 106 109  CO2 22 --  GLUCOSE 90 89  BUN 13 13  CREATININE 1.30 1.30  CALCIUM 9.5 --  MG 2.1 --  PHOS -- --   Liver Function Tests:  Lab 06/29/12 0300  AST 45*  ALT 26  ALKPHOS 59  BILITOT 0.3  PROT 7.1  ALBUMIN 3.3*   CBC:  Lab 06/29/12 0300 06/29/12 0256  WBC 8.8 --  NEUTROABS -- --  HGB 15.7 16.7  HCT 45.1 49.0  MCV 80.5 --  PLT 199 --   Cardiac Enzymes:  Lab 06/29/12 0827  CKTOTAL --  CKMB --  CKMBINDEX --  TROPONINI <0.30    Studies/Results: Ct Head Wo Contrast  06/29/2012  *RADIOLOGY REPORT*  Clinical Data: 61 year old male with syncope.  CT HEAD WITHOUT CONTRAST  Technique:  Contiguous axial images were obtained from the base of the skull through the vertex without contrast.  Comparison: 08/17/2009 and prior CTs   Findings: A left frontal infarct is new since the prior study in 2010 but appears subacute to remote. Remote infarcts within the left parietal region, right cerebellum, bilateral basal ganglia and right thalamus are again noted. Chronic small vessel white matter ischemic changes are again identified.  No acute intracranial abnormalities are identified, including mass lesion or mass effect, hydrocephalus, extra-axial fluid collection, midline shift, hemorrhage, or acute infarction.  The visualized bony calvarium is unremarkable.  IMPRESSION: No evidence of acute intracranial abnormality.  New left frontal infarct since 2010, but appears at least subacute to remote.  Remote infarcts and chronic small vessel white matter ischemic changes as described.   Original Report Authenticated By: Lura Em, M.D.    Dg Chest Portable 1 View  06/29/2012  *RADIOLOGY REPORT*  Clinical Data: Loss of consciousness.  Non responsive.  PORTABLE CHEST - 1 VIEW  Comparison: 11/24/2009  Findings: Stable postoperative changes in the mediastinum.  Shallow inspiration.  Linear atelectasis in the left lung base.  Normal heart size and pulmonary vascularity.  No focal airspace consolidation in the lungs.  No blunting of costophrenic angles. No pneumothorax.  Calcified and tortuous aorta.  IMPRESSION: Shallow inspiration with linear atelectasis in the left lung base.   Original Report Authenticated By: Neale Burly, M.D.  Medications: Scheduled Meds:   . aspirin  324 mg Oral Once  . aspirin EC  81 mg Oral Daily  . atorvastatin  20 mg Oral Daily  . docusate sodium  100 mg Oral BID  . sodium chloride  3 mL Intravenous Q12H   Continuous Infusions:   . sodium chloride 50 mL/hr at 06/29/12 1045     Assessment/Plan: Active Problems:  HYPERTENSION:  - Add hydralazine IV PRN, avoid labetalol and beta blockers  CAD  Cocaine use: toxicology still pending  Syncope: Continue workup, 2-D echo, carotid Dopplers, cycle  troponins   DVT Prophylaxis:  Code Status:Full code  Disposition: Hopefully in AM   LOS: 0 days   Jeilani Grupe M.D. [facility] 06/29/2012, 2:14 PM Pager: [phone]  If 7PM-7AM, please contact night-coverage [email or web address] Password [email or web address]

## 2012-06-29 NOTE — H&P (Signed)
PCP:  Nolene Ebbs, M.D.   Chief Complaint:   unresponsive  HPI: [name] is a 61 y.o. male   has a past medical history of Hypertension; CHF (congestive heart failure); and Coronary artery disease.   Presented with  Wife found him unresponsive in a chair. Attempted to give nitroglycerine but unsuccessful. He has hx of the same presentation with prior MI. Denies any chest pain. 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 No new weakness reported by the wife. He did admit to doing cocaine yesterday and have been smoking cigarette few minutes prior to the event. No seizure like activity noted.   Review of Systems:     Pertinent positives include: syncope  Constitutional:  No weight loss, night sweats, Fevers, chills, fatigue, weight loss  HEENT:  No headaches, Difficulty swallowing,Tooth/dental problems,Sore throat,  No sneezing, itching, ear ache, nasal congestion, post nasal drip,  Cardio-vascular:  No chest pain, Orthopnea, PND, anasarca, dizziness, palpitations.no Bilateral lower extremity swelling  GI:  No heartburn, indigestion, abdominal pain, nausea, vomiting, diarrhea, change in bowel habits, loss of appetite, melena, blood in stool, hematemesis Resp:  no shortness of breath at rest. No dyspnea on exertion, No excess mucus, no productive cough, No non-productive cough, No coughing up of blood.No change in color of mucus.No wheezing. Skin:  no rash or lesions. No jaundice GU:  no dysuria, change in color of urine, no urgency or frequency. No straining to urinate.  No flank pain.  Musculoskeletal:  No joint pain or no joint swelling. No decreased range of motion. No back pain.  Psych:  No change in mood or affect. No depression or anxiety. No memory loss.  Neuro: no localizing neurological complaints, no tingling, no weakness, no double vision, no gait abnormality, no slurred speech, no confusion  Otherwise ROS are negative  except for above, 10 systems were reviewed  Past Medical History: Past Medical History  Diagnosis Date  . Hypertension   . CHF (congestive heart failure)   . Coronary artery disease   . CVA (cerebral infarction)    Past Surgical History  Procedure Date  . Cardiac catheterization   . Coronary artery bypass graft      Medications: Prior to Admission medications   Medication Sig Start Date End Date Taking? Authorizing Provider  aspirin EC 81 MG tablet Take 81 mg by mouth daily.   Yes Historical Provider, MD  atorvastatin (LIPITOR) 20 MG tablet Take 20 mg by mouth daily.   Yes Historical Provider, MD  lisinopril (PRINIVIL,ZESTRIL) 10 MG tablet Take 10 mg by mouth daily.   Yes Historical Provider, MD  potassium chloride SA (K-DUR,KLOR-CON) 20 MEQ tablet Take 20 mEq by mouth 2 (two) times daily.   Yes Historical Provider, MD  traMADol (ULTRAM) 50 MG tablet Take 50 mg by mouth every 6 (six) hours as needed. For pain   Yes Historical Provider, MD    Allergies:  No Known Allergies  Social History:  Ambulatory with a  walker Lives at home   reports that he has been smoking.  He does not have any smokeless tobacco history on file. He reports that he drinks alcohol. He reports that he uses illicit drugs ("Crack" cocaine).   Family History: family history includes Diabetes in his sister and Heart disease in his brother and sister.    Physical Exam: Patient Vitals for the past 24 hrs:  BP Temp Temp src Pulse Resp SpO2  06/29/12 0438 119/83  mmHg - - 83  18  98 %  06/29/12 0300 140/113 mmHg - - 95  - -  06/29/12 0257 126/90 mmHg - - 95  - -  06/29/12 0256 132/77 mmHg - - 79  - -  06/29/12 0133 - 97.6 F (36.4 C) - - - -  06/29/12 0012 112/87 mmHg 97.5 F (36.4 C) Oral 72  18  97 %    1. General:  in No Acute distress 2. Psychological: Alert and  Oriented 3. Head/ENT:   Moist   Mucous Membranes                          Head Non traumatic, neck supple                           Poor Dentition 4. SKIN: normal   Skin turgor,  Skin clean Dry and intact no rash 5. Heart: Regular rate and rhythm no Murmur, Rub or gallop 6. Lungs: Clear to auscultation bilaterally, no wheezes or crackles   7. Abdomen: Soft, non-tender, Non distended 8. Lower extremities: no clubbing, cyanosis, or edema 9. Neurologically cranial nerves intact with exception of right facial droop that is unchanged. Left foot drop. Otherwise strength intact.  10. MSK: Normal range of motion  body mass index is unknown because there is no height or weight on file.   Labs on Admission:   [facility] 06/29/12 0300 06/29/12 0256  NA 137 144  K 5.1 3.8  CL 106 109  CO2 22 --  GLUCOSE 90 89  BUN 13 13  CREATININE 1.30 1.30  CALCIUM 9.5 --  MG 2.1 --  PHOS -- --    Basename 06/29/12 0300  AST 45*  ALT 26  ALKPHOS 59  BILITOT 0.3  PROT 7.1  ALBUMIN 3.3*   No results found for this basename: LIPASE:2,AMYLASE:2 in the last 72 hours  Basename 06/29/12 0300 06/29/12 0256  WBC 8.8 --  NEUTROABS -- --  HGB 15.7 16.7  HCT 45.1 49.0  MCV 80.5 --  PLT 199 --   No results found for this basename: CKTOTAL:3,CKMB:3,CKMBINDEX:3,TROPONINI:3 in the last 72 hours No results found for this basename: TSH,T4TOTAL,FREET3,T3FREE,THYROIDAB in the last 72 hours No results found for this basename: VITAMINB12:2,FOLATE:2,FERRITIN:2,TIBC:2,IRON:2,RETICCTPCT:2 in the last 72 hours  CrCl is unknown because there is no height on file for the current visit. ABG    Component Value Date/Time   PHART 7.376 08/23/2009 2022   HCO3 19.8* 08/23/2009 2022   TCO2 22 06/29/2012 0256   ACIDBASEDEF 5.0* 08/23/2009 2022   O2SAT 98.0 08/23/2009 2022     No results found for this basename: DDIMER     Other results:  I have pearsonaly reviewed this: ECG REPORT  Rate: 82  Rhythm: NSR ST&T Change: unchanged from ECG in March   Cultures:    Component Value Date/Time   SDES STOOL 08/28/2009 1552   [place]  08/28/2009 1552   CULT NO GROWTH 08/21/2009 1236   REPTSTATUS 08/29/2009 FINAL 08/28/2009 1552       Radiological Exams on Admission: Dg Chest Portable 1 View  06/29/2012  *RADIOLOGY REPORT*  Clinical Data: Loss of consciousness.  Non responsive.  PORTABLE CHEST - 1 VIEW  Comparison: 11/24/2009  Findings: Stable postoperative changes in the mediastinum.  Shallow inspiration.  Linear atelectasis in the left lung base.  Normal heart size and pulmonary vascularity.  No focal airspace consolidation  in the lungs.  No blunting of costophrenic angles. No pneumothorax.  Calcified and tortuous aorta.  IMPRESSION: Shallow inspiration with linear atelectasis in the left lung base.   Original Report Authenticated By: Neale Burly, M.D.     Chart has been reviewed  Assessment/Plan  61 yo w syncopal episode and hx of CAD and cocaine abuse  Present on Admission:  .Syncope - monitor on tele, cycle cardiac enzymes, serial ECG, echo, carotid dopplers, neurological etiology less likely no acute changes. Will obtain CT of the head .Cocaine use - urine tox screen is pending, avoid betablockers .HYPERTENSION - continue home medications but hold lisinopril given slightly high K .CAD - continue aspirin and cycle CE    Prophylaxis: SCD   Protonix  CODE STATUS: FULL CODE  Other plan as per orders.  I have spent a total of 55 min on this admission  Ruble Pumphrey 06/29/2012, 5:53 AM

## 2012-06-30 ENCOUNTER — Encounter (HOSPITAL_COMMUNITY): Payer: Self-pay | Admitting: General Practice

## 2012-06-30 ENCOUNTER — Observation Stay (HOSPITAL_COMMUNITY): Payer: Medicare Other

## 2012-06-30 DIAGNOSIS — G9340 Encephalopathy, unspecified: Secondary | ICD-10-CM

## 2012-06-30 LAB — URINE DRUGS OF ABUSE SCREEN W ALC, ROUTINE (REF LAB)
Amphetamine Screen, Ur: NEGATIVE
Barbiturate Quant, Ur: NEGATIVE
Benzodiazepines.: NEGATIVE
Cocaine Metabolites: POSITIVE — AB
Creatinine,U: 39.7 mg/dL
Ethyl Alcohol: <10 mg/dL (ref <10)
Marijuana Metabolite: NEGATIVE
Methadone: NEGATIVE
Opiate Screen, Urine: NEGATIVE
Phencyclidine (PCP): NEGATIVE
Propoxyphene: NEGATIVE

## 2012-06-30 LAB — URINALYSIS, ROUTINE W REFLEX MICROSCOPIC
Bilirubin Urine: NEGATIVE
Glucose, UA: NEGATIVE mg/dL
Hgb urine dipstick: NEGATIVE
Ketones, ur: NEGATIVE mg/dL
Leukocytes, UA: NEGATIVE
Nitrite: NEGATIVE
Protein, ur: NEGATIVE mg/dL
Specific Gravity, Urine: 1.008 (ref 1.005–1.030)
Urobilinogen, UA: 1 mg/dL (ref 0.0–1.0)
pH: 8 (ref 5.0–8.0)

## 2012-06-30 LAB — COMPREHENSIVE METABOLIC PANEL
ALT: 28 U/L (ref 0–53)
AST: 37 U/L (ref 0–37)
Albumin: 3.7 g/dL (ref 3.5–5.2)
Alkaline Phosphatase: 73 U/L (ref 39–117)
BUN: 9 mg/dL (ref 6–23)
CO2: 23 mEq/L (ref 19–32)
Calcium: 9.6 mg/dL (ref 8.4–10.5)
Chloride: 106 mEq/L (ref 96–112)
Creatinine, Ser: 1.2 mg/dL (ref 0.50–1.35)
GFR calc Af Amer: 74 mL/min — ABNORMAL LOW (ref >90)
GFR calc non Af Amer: 64 mL/min — ABNORMAL LOW (ref >90)
Glucose, Bld: 96 mg/dL (ref 70–99)
Potassium: 3.8 mEq/L (ref 3.5–5.1)
Sodium: 141 mEq/L (ref 135–145)
Total Bilirubin: 0.7 mg/dL (ref 0.3–1.2)
Total Protein: 7.8 g/dL (ref 6.0–8.3)

## 2012-06-30 LAB — RAPID URINE DRUG SCREEN, HOSP PERFORMED
Amphetamines: NOT DETECTED
Barbiturates: NOT DETECTED
Benzodiazepines: NOT DETECTED
Cocaine: POSITIVE — AB
Opiates: NOT DETECTED
Tetrahydrocannabinol: NOT DETECTED

## 2012-06-30 LAB — MAGNESIUM: Magnesium: 1.8 mg/dL (ref 1.5–2.5)

## 2012-06-30 LAB — CBC
HCT: 50.2 % (ref 39.0–52.0)
Hemoglobin: 17.4 g/dL — ABNORMAL HIGH (ref 13.0–17.0)
MCH: 27.9 pg (ref 26.0–34.0)
MCHC: 34.7 g/dL (ref 30.0–36.0)
MCV: 80.6 fL (ref 78.0–100.0)
Platelets: 238 10*3/uL (ref 150–400)
RBC: 6.23 MIL/uL — ABNORMAL HIGH (ref 4.22–5.81)
RDW: 15.6 % — ABNORMAL HIGH (ref 11.5–15.5)
WBC: 5.9 10*3/uL (ref 4.0–10.5)

## 2012-06-30 LAB — TSH: TSH: 2.62 u[IU]/mL (ref 0.350–4.500)

## 2012-06-30 LAB — PHOSPHORUS: Phosphorus: 1.8 mg/dL — ABNORMAL LOW (ref 2.3–4.6)

## 2012-06-30 MED ORDER — LISINOPRIL 20 MG PO TABS
20.0000 mg | ORAL_TABLET | Freq: Every day | ORAL | Status: DC
  Administered 2012-07-01: 20 mg via ORAL
  Filled 2012-06-30: qty 1

## 2012-06-30 MED ORDER — HYDRALAZINE HCL 50 MG PO TABS
50.0000 mg | ORAL_TABLET | Freq: Two times a day (BID) | ORAL | Status: DC
  Administered 2012-06-30 – 2012-07-01 (×3): 50 mg via ORAL
  Filled 2012-06-30 (×4): qty 1

## 2012-06-30 MED ORDER — METOPROLOL TARTRATE 1 MG/ML IV SOLN
5.0000 mg | Freq: Once | INTRAVENOUS | Status: AC
  Administered 2012-06-30: 5 mg via INTRAVENOUS
  Filled 2012-06-30: qty 5

## 2012-06-30 MED ORDER — LISINOPRIL 10 MG PO TABS
10.0000 mg | ORAL_TABLET | Freq: Every day | ORAL | Status: DC
  Administered 2012-06-30: 10 mg via ORAL
  Filled 2012-06-30: qty 1

## 2012-06-30 MED ORDER — LISINOPRIL 10 MG PO TABS
10.0000 mg | ORAL_TABLET | Freq: Once | ORAL | Status: AC
  Administered 2012-06-30: 10 mg via ORAL
  Filled 2012-06-30: qty 1

## 2012-06-30 MED ORDER — INFLUENZA VIRUS VACC SPLIT PF IM SUSP
0.5000 mL | INTRAMUSCULAR | Status: AC
  Administered 2012-07-01: 0.5 mL via INTRAMUSCULAR
  Filled 2012-06-30: qty 0.5

## 2012-06-30 NOTE — Progress Notes (Addendum)
06/29/12 1430  SLP G-Codes **NOT FOR INPATIENT CLASS**  Functional Assessment Tool Used Clinical judgement from BSE  Functional Limitations Swallowing  Swallow Current Status [id number]) CI  Swallow Goal Status [id number]) CI  Swallow Discharge Status [id number]) CI

## 2012-06-30 NOTE — Progress Notes (Signed)
Patient ID: [name]  male  [id number]    DOB: February 10, 1951    DOA: 06/29/2012  PCP: No primary provider on file.  Subjective: Patient seen earlier this morning, was inappropriately laughing and acting confused refusing medications and tele  Objective: Weight change:   Intake/Output Summary (Last 24 hours) at 06/30/12 1700 Last data filed at 06/30/12 0824  Gross per 24 hour  Intake    240 ml  Output   1100 ml  Net   -860 ml   Blood pressure 140/77, pulse 106, temperature 97.8 F (36.6 C), temperature source Oral, resp. rate 18, height 5\' 9"  (1.753 m), weight 91.354 kg (201 lb 6.4 oz), SpO2 99.00%.  Physical Exam: General: Alert and awake, not in any acute distress, oriented. HEENT: anicteric sclera, pupils reactive to light and accommodation, EOMI CVS: S1-S2 clear, no murmur rubs or gallops Chest: clear to auscultation bilaterally, no wheezing, rales or rhonchi Abdomen: soft nontender, nondistended, normal bowel sounds, no organomegaly Extremities: no cyanosis, clubbing or edema noted bilaterally   Lab Results: Basic Metabolic Panel:  Lab [id number] 0520 06/29/12 0300  NA 141 137  K 3.8 5.1  CL 106 106  CO2 23 22  GLUCOSE 96 90  BUN 9 13  CREATININE 1.20 1.30  CALCIUM 9.6 9.5  MG 1.8 --  PHOS 1.8* --   Liver Function Tests:  Lab 06/30/12 0520 06/29/12 0300  AST 37 45*  ALT 28 26  ALKPHOS 73 59  BILITOT 0.7 0.3  PROT 7.8 7.1  ALBUMIN 3.7 3.3*   CBC:  Lab 06/30/12 0520 06/29/12 0300  WBC 5.9 8.8  NEUTROABS -- --  HGB 17.4* 15.7  HCT 50.2 45.1  MCV 80.6 80.5  PLT 238 199   Cardiac Enzymes:  Lab 06/29/12 1928 06/29/12 1403 06/29/12 0827  CKTOTAL -- -- --  CKMB -- -- --  CKMBINDEX -- -- --  TROPONINI <0.30 <0.30 <0.30    Studies/Results: Ct Head Wo Contrast  06/29/2012  *RADIOLOGY REPORT*  Clinical Data: 61 year old male with syncope.  CT HEAD WITHOUT CONTRAST  Technique:  Contiguous axial images were obtained from the base of the skull  through the vertex without contrast.  Comparison: 08/17/2009 and prior CTs  Findings: A left frontal infarct is new since the prior study in 2010 but appears subacute to remote. Remote infarcts within the left parietal region, right cerebellum, bilateral basal ganglia and right thalamus are again noted. Chronic small vessel white matter ischemic changes are again identified.  No acute intracranial abnormalities are identified, including mass lesion or mass effect, hydrocephalus, extra-axial fluid collection, midline shift, hemorrhage, or acute infarction.  The visualized bony calvarium is unremarkable.  IMPRESSION: No evidence of acute intracranial abnormality.  New left frontal infarct since 2010, but appears at least subacute to remote.  Remote infarcts and chronic small vessel white matter ischemic changes as described.   Original Report Authenticated By: Lura Em, M.D.    Dg Chest Portable 1 View  06/29/2012  *RADIOLOGY REPORT*  Clinical Data: Loss of consciousness.  Non responsive.  PORTABLE CHEST - 1 VIEW  Comparison: 11/24/2009  Findings: Stable postoperative changes in the mediastinum.  Shallow inspiration.  Linear atelectasis in the left lung base.  Normal heart size and pulmonary vascularity.  No focal airspace consolidation in the lungs.  No blunting of costophrenic angles. No pneumothorax.  Calcified and tortuous aorta.  IMPRESSION: Shallow inspiration with linear atelectasis in the left lung base.   Original Report Authenticated By: Talmage Coin.  Gerilyn Nestle, M.D.     Medications: Scheduled Meds:    . aspirin EC  81 mg Oral Daily  . atorvastatin  20 mg Oral Daily  . docusate sodium  100 mg Oral BID  . hydrALAZINE  50 mg Oral BID  . lisinopril  10 mg Oral Once  . lisinopril  20 mg Oral Daily  . metoprolol  5 mg Intravenous Once  . sodium chloride  3 mL Intravenous Q12H  . DISCONTD: lisinopril  10 mg Oral Daily   Continuous Infusions:    . sodium chloride 50 mL/hr at 06/29/12 1045      Assessment/Plan: Active Problems:  HYPERTENSION: Uncontrolled - avoid labetalol and beta blockers - Placed on oral hydralazine and lisinopril - 2-D echo still pending  CAD - No chest pain, continue aspirin, ACE inhibitor, hydralazine for the BP control, statins - 2-D echo pending - Troponin negative for acute ACS   Cocaine use: toxicology + positive for cocaine - Will place substance abuse consult   Syncope: Continue workup, 2-D echo, carotid Dopplers  Acute encephalopathy: Patient was acting somewhat confused this morning  - CT head was obtained which shows ? New left frontal infarct since 2010 but appears at least subacute to remote. Will obtain stat MRI of the brain, if positive for acute stroke, will place on stroke pathway. Patient has been actively using cocaine.    DVT Prophylaxis:  Code Status:Full code  Disposition:   LOS: 1 day   Shivangi Lutz M.D. [facility] 06/30/2012, 5:00 PM Pager: [phone]  If 7PM-7AM, please contact night-coverage [email or web address] Password [email or web address]

## 2012-06-30 NOTE — Progress Notes (Signed)
  Echocardiogram 2D Echocardiogram has been performed.  [name] FRANCES 06/30/2012, 5:38 PM

## 2012-06-30 NOTE — Progress Notes (Signed)
UR done. 

## 2012-07-01 LAB — BASIC METABOLIC PANEL
BUN: 16 mg/dL (ref 6–23)
CO2: 19 mEq/L (ref 19–32)
Calcium: 9.4 mg/dL (ref 8.4–10.5)
Chloride: 111 mEq/L (ref 96–112)
Creatinine, Ser: 1.35 mg/dL (ref 0.50–1.35)
GFR calc Af Amer: 64 mL/min — ABNORMAL LOW (ref >90)
GFR calc non Af Amer: 56 mL/min — ABNORMAL LOW (ref >90)
Glucose, Bld: 99 mg/dL (ref 70–99)
Potassium: 3.8 mEq/L (ref 3.5–5.1)
Sodium: 142 mEq/L (ref 135–145)

## 2012-07-01 MED ORDER — TRAMADOL HCL 50 MG PO TABS: 50.0000 mg | ORAL_TABLET | Freq: Four times a day (QID) | ORAL | Status: DC | PRN

## 2012-07-01 MED ORDER — HYDRALAZINE HCL 50 MG PO TABS: 50.0000 mg | ORAL_TABLET | Freq: Two times a day (BID) | ORAL | Status: DC

## 2012-07-01 MED ORDER — LISINOPRIL 10 MG PO TABS: 20.0000 mg | ORAL_TABLET | Freq: Every day | ORAL | Status: DC

## 2012-07-01 MED ORDER — ASPIRIN EC 81 MG PO TBEC: 81.0000 mg | DELAYED_RELEASE_TABLET | Freq: Every day | ORAL | Status: AC

## 2012-07-01 NOTE — Progress Notes (Signed)
Co-signed for [employer] RN/BSN for assessments, IV assessments, I & O's,  Medication administration, Vital signs, Care plans, Progress notes, and Patient education. Tonny Branch, RN/BSN

## 2012-07-01 NOTE — Discharge Summary (Signed)
Physician Discharge Summary  Patient ID: [name] MRN: [id number] DOB/AGE: 1950/09/22 61 y.o.  Admit date: 06/29/2012 Discharge date: 07/01/2012  Primary Care Physician:  Philis Fendt, MD  Discharge Diagnoses:    .Cocaine use . malignant hypertension likely secondary to cocaine use . CAD .Syncope likely secondary to malignant hypertension and cocaine use .Acute encephalopathy  Consults:  None  Discharge Medications:   Medication List     As of 07/01/2012 11:30 AM    STOP taking these medications         potassium chloride SA 20 MEQ tablet   Commonly known as: K-DUR,KLOR-CON      TAKE these medications         aspirin EC 81 MG tablet   Take 1 tablet (81 mg total) by mouth daily.      atorvastatin 20 MG tablet   Commonly known as: LIPITOR   Take 20 mg by mouth daily.      hydrALAZINE 50 MG tablet   Commonly known as: APRESOLINE   Take 1 tablet (50 mg total) by mouth 2 (two) times daily.      lisinopril 10 MG tablet   Commonly known as: PRINIVIL,ZESTRIL   Take 2 tablets (20 mg total) by mouth daily.      traMADol 50 MG tablet   Commonly known as: ULTRAM   Take 50 mg by mouth every 6 (six) hours as needed. For pain         Brief H and P: For complete details please refer to admission H and P, but in brief patient is a 61 year old male with history of hypertension, CHF, coronary artery disease was found unresponsive in his chair by his wife. Patient was brought to the ED and he admitted to doing cocaine a day before and smoking few minutes prior to the syncopal event. Patient was admitted for further workup  Hospital Course:  Patient is 61 year old male with a history of hypertension, CHF, coronary artery disease who was found unresponsive with a syncopal episode. Patient had admitted to using the cocaine a day before. During the hospitalization patient had uncontrolled hypertension  Malignant HYPERTENSION: Uncontrolled likely secondary to cocaine use  and noncompliance. He should was placed on oral hydralazine and lisinopril which eventually improved his BP. 2-D echocardiogram was done which showed EF of 55% with no regional wall motion abnormalities, grade 1 diastolic dysfunction  CAD : Patient had no chest pain he was continued on aspirin, ACE inhibitor, hydralazine for the BP control, statins. Troponin remained negative for acute ACS. Toxicology was positive for cocaine.   Syncope: Patient was ruled out for ACS, 2-D echocardiogram was obtained which showed EF of 55% . carotid Dopplers showed 60-79% range of stenosis in the left internal carotid artery. Patient was strongly recommended to stay off cocaine and heart healthy diet and followup with his PCP.  Acute encephalopathy: During hospitalization patient was noticed to have some bizarre behavior hence CT head was obtained, which showed possible new left frontal infarct since 2010 but appears at least subacute to remote. MRI of the brain was obtained which showed no acute infarct.    Day of Discharge BP 127/83  Pulse 98  Temp 98.3 F (36.8 C) (Oral)  Resp 20  Ht 5\' 9"  (1.753 m)  Wt 91.2 kg (201 lb 1 oz)  BMI 29.69 kg/m2  SpO2 92%  Physical Exam: General: Alert and awake oriented x3 not in any acute distress. HEENT: anicteric sclera, pupils reactive to light and accommodation  CVS: S1-S2 clear no murmur rubs or gallops Chest: clear to auscultation bilaterally, no wheezing rales or rhonchi Abdomen: soft nontender, nondistended, normal bowel sounds, no organomegaly Extremities: no cyanosis, clubbing or edema noted bilaterally Neuro: Cranial nerves II-XII intact, no focal neurological deficits   The results of significant diagnostics from this hospitalization (including imaging, microbiology, ancillary and laboratory) are listed below for reference.    LAB RESULTS: Basic Metabolic Panel:  Lab [id number] 0530 06/30/12 0520  NA 142 141  K 3.8 3.8  CL 111 106  CO2 19 23  GLUCOSE  99 96  BUN 16 9  CREATININE 1.35 1.20  CALCIUM 9.4 9.6  MG -- 1.8  PHOS -- 1.8*   Liver Function Tests:  Lab 06/30/12 0520 06/29/12 0300  AST 37 45*  ALT 28 26  ALKPHOS 73 59  BILITOT 0.7 0.3  PROT 7.8 7.1  ALBUMIN 3.7 3.3*  CBC:  Lab 06/30/12 0520 06/29/12 0300  WBC 5.9 8.8  NEUTROABS -- --  HGB 17.4* 15.7  HCT 50.2 45.1  MCV 80.6 --  PLT 238 199   Cardiac Enzymes:  Lab 06/29/12 1928 06/29/12 1403  CKTOTAL -- --  CKMB -- --  CKMBINDEX -- --  TROPONINI <0.30 <0.30   BNP: No components found with this basename: POCBNP:2 CBG: No results found for this basename: GLUCAP:2 in the last 168 hours  Significant Diagnostic Studies:  Ct Head Wo Contrast  06/29/2012  *RADIOLOGY REPORT*  Clinical Data: 61 year old male with syncope.  CT HEAD WITHOUT CONTRAST  Technique:  Contiguous axial images were obtained from the base of the skull through the vertex without contrast.  Comparison: 08/17/2009 and prior CTs  Findings: A left frontal infarct is new since the prior study in 2010 but appears subacute to remote. Remote infarcts within the left parietal region, right cerebellum, bilateral basal ganglia and right thalamus are again noted. Chronic small vessel white matter ischemic changes are again identified.  No acute intracranial abnormalities are identified, including mass lesion or mass effect, hydrocephalus, extra-axial fluid collection, midline shift, hemorrhage, or acute infarction.  The visualized bony calvarium is unremarkable.  IMPRESSION: No evidence of acute intracranial abnormality.  New left frontal infarct since 2010, but appears at least subacute to remote.  Remote infarcts and chronic small vessel white matter ischemic changes as described.   Original Report Authenticated By: Lura Em, M.D.    Dg Chest Portable 1 View  06/29/2012  *RADIOLOGY REPORT*  Clinical Data: Loss of consciousness.  Non responsive.  PORTABLE CHEST - 1 VIEW  Comparison: 11/24/2009  Findings:  Stable postoperative changes in the mediastinum.  Shallow inspiration.  Linear atelectasis in the left lung base.  Normal heart size and pulmonary vascularity.  No focal airspace consolidation in the lungs.  No blunting of costophrenic angles. No pneumothorax.  Calcified and tortuous aorta.  IMPRESSION: Shallow inspiration with linear atelectasis in the left lung base.   Original Report Authenticated By: Neale Burly, M.D.      Disposition and Follow-up:     Discharge Orders    Future Orders Please Complete By Expires   Diet - low sodium heart healthy      Increase activity slowly          DISPOSITION: Home  DIET: Heart healthy diet ACTIVITY: As tolerated  DISCHARGE FOLLOW-UP Follow-up Information    Follow up with AVBUERE,EDWIN A, MD. Schedule an appointment as soon as possible for a visit in 1 week. (for hospital follow-up)  Contact information:   [address] [place] [address] [phone]          Time spent on Discharge: 45 mins Signed:   Cala Kruckenberg M.D. [facility] 07/01/2012, 11:30 AM Pager: [phone]

## 2012-07-01 NOTE — Progress Notes (Signed)
DC Home, DC Tele. Discharge instructions and home medications discussed with patient and patient's wife. Patient and wife denied any questions or concerns at this time. Patient leaving unit via wheelchair and appears in no acute distress.

## 2012-07-01 NOTE — Progress Notes (Signed)
Pts. HR sustaining in the 100s. Pt. Asymptomatic. Denies pain. No signs of discomfort noted. MD notified. Via text page. Will continue to monitor. Stevi Hollinshead, Katherine Roan

## 2012-07-01 NOTE — Progress Notes (Addendum)
Co-signed for [employer] RN/BSN for assessments, IV assessments, I & O's,  Medication administration, Vital signs, Care plans, Progress notes, and Patient education. Tonny Branch, RN/BSN

## 2012-07-02 LAB — COCAINE, URINE, CONFIRMATION: Benzoylecgonine GC/MS Conf: 304 ng/mL — ABNORMAL HIGH

## 2012-08-15 ENCOUNTER — Encounter: Payer: Self-pay | Admitting: Vascular Surgery

## 2012-08-15 ENCOUNTER — Other Ambulatory Visit: Payer: Self-pay | Admitting: *Deleted

## 2012-08-15 DIAGNOSIS — I70219 Atherosclerosis of native arteries of extremities with intermittent claudication, unspecified extremity: Secondary | ICD-10-CM

## 2012-08-15 DIAGNOSIS — Z48812 Encounter for surgical aftercare following surgery on the circulatory system: Secondary | ICD-10-CM

## 2013-09-30 ENCOUNTER — Other Ambulatory Visit: Payer: Self-pay | Admitting: *Deleted

## 2013-09-30 DIAGNOSIS — M79609 Pain in unspecified limb: Secondary | ICD-10-CM

## 2013-09-30 DIAGNOSIS — I739 Peripheral vascular disease, unspecified: Secondary | ICD-10-CM

## 2013-10-08 ENCOUNTER — Encounter: Payer: Self-pay | Admitting: Family

## 2013-10-09 ENCOUNTER — Encounter (HOSPITAL_COMMUNITY): Payer: Medicare Other

## 2013-10-09 ENCOUNTER — Ambulatory Visit: Payer: Medicare Other | Admitting: Family

## 2013-10-09 ENCOUNTER — Other Ambulatory Visit (HOSPITAL_COMMUNITY): Payer: Medicare Other

## 2013-11-18 ENCOUNTER — Encounter: Payer: Self-pay | Admitting: Family

## 2013-11-19 ENCOUNTER — Encounter (HOSPITAL_COMMUNITY): Payer: Medicare Other

## 2013-11-19 ENCOUNTER — Ambulatory Visit: Payer: Medicare Other | Admitting: Family

## 2013-11-19 ENCOUNTER — Other Ambulatory Visit (HOSPITAL_COMMUNITY): Payer: Medicare Other

## 2013-12-10 ENCOUNTER — Encounter (HOSPITAL_COMMUNITY): Payer: Medicare Other

## 2013-12-10 ENCOUNTER — Ambulatory Visit (HOSPITAL_COMMUNITY): Payer: Medicare Other

## 2013-12-10 ENCOUNTER — Ambulatory Visit: Payer: Medicare Other | Admitting: Family

## 2013-12-24 ENCOUNTER — Encounter: Payer: Self-pay | Admitting: Family

## 2013-12-25 ENCOUNTER — Other Ambulatory Visit (HOSPITAL_COMMUNITY): Payer: Medicare Other

## 2013-12-25 ENCOUNTER — Ambulatory Visit: Payer: Medicare Other | Admitting: Family

## 2013-12-25 ENCOUNTER — Encounter (HOSPITAL_COMMUNITY): Payer: Medicare Other

## 2014-01-06 ENCOUNTER — Encounter: Payer: Self-pay | Admitting: Family

## 2014-01-07 ENCOUNTER — Inpatient Hospital Stay (HOSPITAL_COMMUNITY): Admission: RE | Admit: 2014-01-07 | Payer: Medicare Other | Source: Ambulatory Visit

## 2014-01-07 ENCOUNTER — Ambulatory Visit: Payer: Medicare Other | Admitting: Family

## 2014-09-26 ENCOUNTER — Encounter (HOSPITAL_COMMUNITY): Payer: Self-pay | Admitting: *Deleted

## 2014-09-26 ENCOUNTER — Inpatient Hospital Stay (HOSPITAL_COMMUNITY)
Admission: EM | Admit: 2014-09-26 | Discharge: 2014-09-29 | DRG: 565 | Disposition: A | Discharge status: Home / Self Care | Payer: Medicare Other | Attending: Internal Medicine | Admitting: Internal Medicine

## 2014-09-26 DIAGNOSIS — E1144 Type 2 diabetes mellitus with diabetic amyotrophy: Secondary | ICD-10-CM | POA: Diagnosis present

## 2014-09-26 DIAGNOSIS — M25562 Pain in left knee: Secondary | ICD-10-CM

## 2014-09-26 DIAGNOSIS — M21372 Foot drop, left foot: Secondary | ICD-10-CM | POA: Diagnosis not present

## 2014-09-26 DIAGNOSIS — F1410 Cocaine abuse, uncomplicated: Secondary | ICD-10-CM | POA: Diagnosis present

## 2014-09-26 DIAGNOSIS — I10 Essential (primary) hypertension: Secondary | ICD-10-CM | POA: Diagnosis present

## 2014-09-26 DIAGNOSIS — I739 Peripheral vascular disease, unspecified: Secondary | ICD-10-CM | POA: Diagnosis present

## 2014-09-26 DIAGNOSIS — Z9114 Patient's other noncompliance with medication regimen: Secondary | ICD-10-CM | POA: Diagnosis present

## 2014-09-26 DIAGNOSIS — F17210 Nicotine dependence, cigarettes, uncomplicated: Secondary | ICD-10-CM | POA: Diagnosis present

## 2014-09-26 DIAGNOSIS — I2510 Atherosclerotic heart disease of native coronary artery without angina pectoris: Secondary | ICD-10-CM | POA: Diagnosis present

## 2014-09-26 DIAGNOSIS — I252 Old myocardial infarction: Secondary | ICD-10-CM

## 2014-09-26 DIAGNOSIS — Z951 Presence of aortocoronary bypass graft: Secondary | ICD-10-CM

## 2014-09-26 DIAGNOSIS — Z9119 Patient's noncompliance with other medical treatment and regimen: Secondary | ICD-10-CM | POA: Diagnosis present

## 2014-09-26 DIAGNOSIS — I509 Heart failure, unspecified: Secondary | ICD-10-CM | POA: Diagnosis present

## 2014-09-26 DIAGNOSIS — Z7982 Long term (current) use of aspirin: Secondary | ICD-10-CM

## 2014-09-26 DIAGNOSIS — E780 Pure hypercholesterolemia: Secondary | ICD-10-CM | POA: Diagnosis present

## 2014-09-26 DIAGNOSIS — G629 Polyneuropathy, unspecified: Secondary | ICD-10-CM | POA: Diagnosis present

## 2014-09-26 DIAGNOSIS — R262 Difficulty in walking, not elsewhere classified: Secondary | ICD-10-CM | POA: Diagnosis present

## 2014-09-26 DIAGNOSIS — M5417 Radiculopathy, lumbosacral region: Secondary | ICD-10-CM | POA: Insufficient documentation

## 2014-09-26 DIAGNOSIS — Z8673 Personal history of transient ischemic attack (TIA), and cerebral infarction without residual deficits: Secondary | ICD-10-CM

## 2014-09-26 DIAGNOSIS — F1910 Other psychoactive substance abuse, uncomplicated: Secondary | ICD-10-CM | POA: Diagnosis present

## 2014-09-26 DIAGNOSIS — G4730 Sleep apnea, unspecified: Secondary | ICD-10-CM | POA: Diagnosis present

## 2014-09-26 DIAGNOSIS — E785 Hyperlipidemia, unspecified: Secondary | ICD-10-CM | POA: Diagnosis present

## 2014-09-26 DIAGNOSIS — H5442 Blindness, left eye, normal vision right eye: Secondary | ICD-10-CM | POA: Diagnosis present

## 2014-09-26 DIAGNOSIS — I471 Supraventricular tachycardia: Secondary | ICD-10-CM

## 2014-09-26 DIAGNOSIS — F10239 Alcohol dependence with withdrawal, unspecified: Secondary | ICD-10-CM | POA: Diagnosis present

## 2014-09-26 HISTORY — DX: Acute embolism and thrombosis of unspecified deep veins of unspecified lower extremity: I82.409

## 2014-09-26 NOTE — ED Provider Notes (Signed)
CSN: [id number]     Arrival date & time 09/26/14  2140 History  This chart was scribed for [employer]. Arthur Chapel, MD by [name], ED Scribe. This patient was seen in room B16C/B16C and the patient's care was started at 11:42 PM.    Chief Complaint  Patient presents with  . Leg Pain   Patient is a 64 y.o. male presenting with leg pain. The history is provided by the patient. No language interpreter was used.  Leg Pain    HPI Comments: [name] is a 64 y.o. male with past medical history of DVT who presents to the Emergency Department complaining of "sharp" left thigh pain beginning today. He reports that he "cannot" move his left leg or the toes on his left foot, also beginning today. There is somewhat of a compliance issue because he then reports that this pain increases with flexion on exam. He denies recent trauma or injury to the area. He denies chest pain, SOB, fevers, prior experience with similar symptoms.   He reports he has never before been on blood pressure medications. Patient has scars to the left thigh area from prior cardiac surgery.   Past Medical History  Diagnosis Date  . Hypertension   . CHF (congestive heart failure)   . Coronary artery disease   . CVA (cerebral infarction)   . High cholesterol   . Peripheral vascular disease   . Myocardial infarction ~ 2011  . Stroke ~ 2009    "left eye; totally blind there"   . Blind left eye ~ 2009  . DVT (deep venous thrombosis)    Past Surgical History  Procedure Laterality Date  . Cardiac catheterization    . Coronary artery bypass graft  ?2011  . Peripheral arterial stent graft  2000's    BLE   Family History  Problem Relation Age of Onset  . Diabetes Sister   . Heart disease Sister   . Heart disease Brother    History  Substance Use Topics  . Smoking status: Current Every Day Smoker -- 0.12 packs/day for 48 years    Types: Cigarettes  . Smokeless tobacco: Never Used  . Alcohol Use: 1.8 oz/week    3  Cans of beer per week     Comment: 06/30/2012 "used to drink constantly; now maybe 1/2 beer qod"    Review of Systems  Musculoskeletal: Positive for myalgias.  All other systems reviewed and are negative.   Allergies  Review of patient's allergies indicates no known allergies.  Home Medications   Prior to Admission medications   Medication Sig Start Date End Date Taking? Authorizing Provider  traMADol (ULTRAM) 50 MG tablet Take 1 tablet (50 mg total) by mouth every 6 (six) hours as needed for pain. For pain 07/01/12  Yes Ripudeep Krystal Eaton, MD  aspirin EC 81 MG tablet Take 1 tablet (81 mg total) by mouth daily. Patient not taking: Reported on 09/27/2014 07/01/12   Ripudeep Krystal Eaton, MD  atorvastatin (LIPITOR) 20 MG tablet Take 20 mg by mouth daily.    Historical Provider, MD  hydrALAZINE (APRESOLINE) 50 MG tablet Take 1 tablet (50 mg total) by mouth 2 (two) times daily. 09/29/14   Samella Parr, NP  lisinopril (PRINIVIL,ZESTRIL) 10 MG tablet Take 2 tablets (20 mg total) by mouth daily. 09/29/14   Samella Parr, NP  Multiple Vitamin (MULTIVITAMIN WITH MINERALS) TABS tablet Take 1 tablet by mouth daily. 09/28/14   Samella Parr, NP   BP  111/67 mmHg  Pulse 84  Temp(Src) 98.1 F (36.7 C) (Oral)  Resp 16  Ht 5\' 11"  (1.803 m)  Wt 219 lb 2.2 oz (99.4 kg)  BMI 30.58 kg/m2  SpO2 99% Physical Exam  Constitutional: He is oriented to person, place, and time. He appears well-developed and well-nourished.  HENT:  Head: Normocephalic and atraumatic.  Mouth/Throat: Oropharynx is clear and moist. No oropharyngeal exudate.  Eyes: EOM are normal. Pupils are equal, round, and reactive to light.  Cardiovascular: Normal rate, regular rhythm and normal heart sounds.  Exam reveals no gallop and no friction rub.   No murmur heard. Strong pulse lateral on left knee; weak to no pulse over left foot or right foot. Strong groin pulse.   Pulmonary/Chest: Effort normal and breath sounds normal. No respiratory  distress. He has no wheezes. He has no rales.  Abdominal: Soft. There is no tenderness. There is no rebound and no guarding.  Musculoskeletal: He exhibits no edema.  Somewhat of a compliance issue decreased dorsiflexion on left foot. Does have some chronic plantar flexion it appears.[name] He will flex and extend the left knee. There is some pain in the left calf with flexion of the left ankle. Good strength in bilateral upper extremities.  Neurological: He is alert and oriented to person, place, and time.  Sensation grossly intact.   Skin: Skin is warm and dry. No rash noted.  Psychiatric: He has a normal mood and affect. His behavior is normal.  Nursing note and vitals reviewed.   ED Course  Procedures   DIAGNOSTIC STUDIES: Oxygen Saturation is 96% on RA, adequate by my interpretation.    COORDINATION OF CARE: 11:49 PM Discussed treatment plan with pt at bedside and pt agreed to plan.   Labs Review Labs Reviewed  COMPREHENSIVE METABOLIC PANEL - Abnormal; Notable for the following:    Glucose, Bld 110 (*)    Creatinine, Ser 1.37 (*)    GFR calc non Af Amer 54 (*)    GFR calc Af Amer 62 (*)    Anion gap 3 (*)    All other components within normal limits  URINE RAPID DRUG SCREEN (HOSP PERFORMED) - Abnormal; Notable for the following:    Opiates POSITIVE (*)    All other components within normal limits  BASIC METABOLIC PANEL - Abnormal; Notable for the following:    Potassium 3.4 (*)    GFR calc non Af Amer 66 (*)    GFR calc Af Amer 76 (*)    All other components within normal limits  COMPREHENSIVE METABOLIC PANEL - Abnormal; Notable for the following:    Creatinine, Ser 1.38 (*)    Albumin 3.3 (*)    GFR calc non Af Amer 53 (*)    GFR calc Af Amer 62 (*)    All other components within normal limits  BASIC METABOLIC PANEL - Abnormal; Notable for the following:    Creatinine, Ser 1.55 (*)    GFR calc non Af Amer 46 (*)    GFR calc Af Amer 54 (*)    Anion gap 3 (*)    All  other components within normal limits  CBC WITH DIFFERENTIAL/PLATELET  CK  CBC  VITAMIN B12  FOLATE  IRON AND TIBC  FERRITIN  RETICULOCYTES  CBC  MAGNESIUM  TSH  CBC    Imaging Review No results found.   EKG Interpretation None      MDM   Final diagnoses:  Foot drop, left   Patient  with pain in his left lower extremity. Has dopplerable pulse. Does have a foot drop on the left side. Has not seen doctors in a while. Will admit to internal medicine. I personally performed the services described in this documentation, which was scribed in my presence. The recorded information has been reviewed and is accurate.       Jasper Riling. Arthur Chapel, MD 09/30/14 [phone]

## 2014-09-26 NOTE — ED Notes (Signed)
edp in room  

## 2014-09-26 NOTE — ED Notes (Signed)
The pt is c/o lt thigh pain since yesterday.  No known injury.  No visible swelling

## 2014-09-26 NOTE — ED Notes (Signed)
Per EMS: pt coming from home with c/o left calf pain for two days, no trauma noted, pain increases with walking, pt has hx of DVT's. VSS, respirations equal and unlabored.

## 2014-09-27 ENCOUNTER — Observation Stay (HOSPITAL_COMMUNITY): Payer: Medicare Other

## 2014-09-27 ENCOUNTER — Emergency Department (HOSPITAL_COMMUNITY): Payer: Medicare Other

## 2014-09-27 ENCOUNTER — Encounter (HOSPITAL_COMMUNITY): Payer: Self-pay | Admitting: General Practice

## 2014-09-27 DIAGNOSIS — I2510 Atherosclerotic heart disease of native coronary artery without angina pectoris: Secondary | ICD-10-CM | POA: Diagnosis present

## 2014-09-27 DIAGNOSIS — M21372 Foot drop, left foot: Principal | ICD-10-CM | POA: Diagnosis present

## 2014-09-27 DIAGNOSIS — F1910 Other psychoactive substance abuse, uncomplicated: Secondary | ICD-10-CM | POA: Diagnosis not present

## 2014-09-27 DIAGNOSIS — I10 Essential (primary) hypertension: Secondary | ICD-10-CM | POA: Diagnosis present

## 2014-09-27 DIAGNOSIS — Z9114 Patient's other noncompliance with medication regimen: Secondary | ICD-10-CM | POA: Diagnosis present

## 2014-09-27 DIAGNOSIS — Z951 Presence of aortocoronary bypass graft: Secondary | ICD-10-CM | POA: Diagnosis not present

## 2014-09-27 DIAGNOSIS — Z8673 Personal history of transient ischemic attack (TIA), and cerebral infarction without residual deficits: Secondary | ICD-10-CM | POA: Diagnosis not present

## 2014-09-27 DIAGNOSIS — I471 Supraventricular tachycardia: Secondary | ICD-10-CM | POA: Diagnosis present

## 2014-09-27 DIAGNOSIS — G629 Polyneuropathy, unspecified: Secondary | ICD-10-CM | POA: Diagnosis present

## 2014-09-27 DIAGNOSIS — M5417 Radiculopathy, lumbosacral region: Secondary | ICD-10-CM | POA: Diagnosis present

## 2014-09-27 DIAGNOSIS — M25562 Pain in left knee: Secondary | ICD-10-CM | POA: Diagnosis present

## 2014-09-27 DIAGNOSIS — F1410 Cocaine abuse, uncomplicated: Secondary | ICD-10-CM | POA: Diagnosis present

## 2014-09-27 DIAGNOSIS — F10239 Alcohol dependence with withdrawal, unspecified: Secondary | ICD-10-CM | POA: Diagnosis present

## 2014-09-27 DIAGNOSIS — I252 Old myocardial infarction: Secondary | ICD-10-CM | POA: Diagnosis not present

## 2014-09-27 DIAGNOSIS — I509 Heart failure, unspecified: Secondary | ICD-10-CM | POA: Diagnosis present

## 2014-09-27 DIAGNOSIS — F17210 Nicotine dependence, cigarettes, uncomplicated: Secondary | ICD-10-CM | POA: Diagnosis present

## 2014-09-27 DIAGNOSIS — I739 Peripheral vascular disease, unspecified: Secondary | ICD-10-CM | POA: Diagnosis present

## 2014-09-27 DIAGNOSIS — E1144 Type 2 diabetes mellitus with diabetic amyotrophy: Secondary | ICD-10-CM | POA: Diagnosis present

## 2014-09-27 DIAGNOSIS — E780 Pure hypercholesterolemia: Secondary | ICD-10-CM | POA: Diagnosis present

## 2014-09-27 DIAGNOSIS — R262 Difficulty in walking, not elsewhere classified: Secondary | ICD-10-CM | POA: Diagnosis present

## 2014-09-27 DIAGNOSIS — H5442 Blindness, left eye, normal vision right eye: Secondary | ICD-10-CM | POA: Diagnosis present

## 2014-09-27 DIAGNOSIS — I517 Cardiomegaly: Secondary | ICD-10-CM

## 2014-09-27 DIAGNOSIS — E785 Hyperlipidemia, unspecified: Secondary | ICD-10-CM | POA: Diagnosis present

## 2014-09-27 DIAGNOSIS — Z9119 Patient's noncompliance with other medical treatment and regimen: Secondary | ICD-10-CM | POA: Diagnosis present

## 2014-09-27 DIAGNOSIS — G4730 Sleep apnea, unspecified: Secondary | ICD-10-CM | POA: Diagnosis present

## 2014-09-27 DIAGNOSIS — Z7982 Long term (current) use of aspirin: Secondary | ICD-10-CM | POA: Diagnosis not present

## 2014-09-27 LAB — RAPID URINE DRUG SCREEN, HOSP PERFORMED
Amphetamines: NOT DETECTED
Barbiturates: NOT DETECTED
Benzodiazepines: NOT DETECTED
Cocaine: NOT DETECTED
Opiates: POSITIVE — AB
Tetrahydrocannabinol: NOT DETECTED

## 2014-09-27 LAB — CBC WITH DIFFERENTIAL/PLATELET
Basophils Absolute: 0 10*3/uL (ref 0.0–0.1)
Basophils Relative: 0 % (ref 0–1)
Eosinophils Absolute: 0.1 10*3/uL (ref 0.0–0.7)
Eosinophils Relative: 2 % (ref 0–5)
HCT: 40.4 % (ref 39.0–52.0)
Hemoglobin: 13.6 g/dL (ref 13.0–17.0)
Lymphocytes Relative: 33 % (ref 12–46)
Lymphs Abs: 2.7 10*3/uL (ref 0.7–4.0)
MCH: 27.1 pg (ref 26.0–34.0)
MCHC: 33.7 g/dL (ref 30.0–36.0)
MCV: 80.6 fL (ref 78.0–100.0)
Monocytes Absolute: 0.7 10*3/uL (ref 0.1–1.0)
Monocytes Relative: 9 % (ref 3–12)
Neutro Abs: 4.5 10*3/uL (ref 1.7–7.7)
Neutrophils Relative %: 56 % (ref 43–77)
Platelets: 255 10*3/uL (ref 150–400)
RBC: 5.01 MIL/uL (ref 4.22–5.81)
RDW: 14.7 % (ref 11.5–15.5)
WBC: 8 10*3/uL (ref 4.0–10.5)

## 2014-09-27 LAB — CBC
HCT: 44.9 % (ref 39.0–52.0)
Hemoglobin: 15.1 g/dL (ref 13.0–17.0)
MCH: 27.4 pg (ref 26.0–34.0)
MCHC: 33.6 g/dL (ref 30.0–36.0)
MCV: 81.5 fL (ref 78.0–100.0)
Platelets: 252 10*3/uL (ref 150–400)
RBC: 5.51 MIL/uL (ref 4.22–5.81)
RDW: 14.7 % (ref 11.5–15.5)
WBC: 7.3 10*3/uL (ref 4.0–10.5)

## 2014-09-27 LAB — BASIC METABOLIC PANEL
Anion gap: 13 (ref 5–15)
BUN: 10 mg/dL (ref 6–23)
CO2: 20 mmol/L (ref 19–32)
Calcium: 9.3 mg/dL (ref 8.4–10.5)
Chloride: 108 mmol/L (ref 96–112)
Creatinine, Ser: 1.16 mg/dL (ref 0.50–1.35)
GFR calc Af Amer: 76 mL/min — ABNORMAL LOW (ref >90)
GFR calc non Af Amer: 66 mL/min — ABNORMAL LOW (ref >90)
Glucose, Bld: 95 mg/dL (ref 70–99)
Potassium: 3.4 mmol/L — ABNORMAL LOW (ref 3.5–5.1)
Sodium: 141 mmol/L (ref 135–145)

## 2014-09-27 LAB — COMPREHENSIVE METABOLIC PANEL
ALT: 20 U/L (ref 0–53)
AST: 25 U/L (ref 0–37)
Albumin: 3.5 g/dL (ref 3.5–5.2)
Alkaline Phosphatase: 66 U/L (ref 39–117)
Anion gap: 3 — ABNORMAL LOW (ref 5–15)
BUN: 14 mg/dL (ref 6–23)
CO2: 26 mmol/L (ref 19–32)
Calcium: 9.2 mg/dL (ref 8.4–10.5)
Chloride: 111 mmol/L (ref 96–112)
Creatinine, Ser: 1.37 mg/dL — ABNORMAL HIGH (ref 0.50–1.35)
GFR calc Af Amer: 62 mL/min — ABNORMAL LOW (ref >90)
GFR calc non Af Amer: 54 mL/min — ABNORMAL LOW (ref >90)
Glucose, Bld: 110 mg/dL — ABNORMAL HIGH (ref 70–99)
Potassium: 3.8 mmol/L (ref 3.5–5.1)
Sodium: 140 mmol/L (ref 135–145)
Total Bilirubin: 0.6 mg/dL (ref 0.3–1.2)
Total Protein: 7.5 g/dL (ref 6.0–8.3)

## 2014-09-27 LAB — CK: Total CK: 220 U/L (ref 7–232)

## 2014-09-27 MED ORDER — LORAZEPAM 1 MG PO TABS
1.0000 mg | ORAL_TABLET | Freq: Four times a day (QID) | ORAL | Status: DC | PRN
Start: 2014-09-27 — End: 2014-09-29

## 2014-09-27 MED ORDER — ALUM & MAG HYDROXIDE-SIMETH 200-200-20 MG/5ML PO SUSP: 30.0000 mL | Freq: Four times a day (QID) | ORAL | Status: DC | PRN

## 2014-09-27 MED ORDER — LORAZEPAM 2 MG/ML IJ SOLN: 1.0000 mg | Freq: Four times a day (QID) | INTRAMUSCULAR | Status: DC | PRN

## 2014-09-27 MED ORDER — ACETAMINOPHEN 325 MG PO TABS: 650.0000 mg | ORAL_TABLET | Freq: Four times a day (QID) | ORAL | Status: DC | PRN

## 2014-09-27 MED ORDER — SODIUM CHLORIDE 0.9 % IV SOLN: 250.0000 mL | INTRAVENOUS | Status: DC | PRN

## 2014-09-27 MED ORDER — FOLIC ACID 1 MG PO TABS
1.0000 mg | ORAL_TABLET | Freq: Every day | ORAL | Status: DC
  Administered 2014-09-27 – 2014-09-29 (×3): 1 mg via ORAL
  Filled 2014-09-27 (×3): qty 1

## 2014-09-27 MED ORDER — HYDRALAZINE HCL 25 MG PO TABS
25.0000 mg | ORAL_TABLET | Freq: Four times a day (QID) | ORAL | Status: DC
  Administered 2014-09-27: 25 mg via ORAL
  Filled 2014-09-27 (×5): qty 1

## 2014-09-27 MED ORDER — LISINOPRIL 20 MG PO TABS
20.0000 mg | ORAL_TABLET | Freq: Every day | ORAL | Status: DC
  Administered 2014-09-27 – 2014-09-29 (×3): 20 mg via ORAL
  Filled 2014-09-27 (×3): qty 1

## 2014-09-27 MED ORDER — ADULT MULTIVITAMIN W/MINERALS CH
1.0000 | ORAL_TABLET | Freq: Every day | ORAL | Status: DC
  Administered 2014-09-27 – 2014-09-29 (×3): 1 via ORAL
  Filled 2014-09-27 (×3): qty 1

## 2014-09-27 MED ORDER — ACETAMINOPHEN 650 MG RE SUPP: 650.0000 mg | Freq: Four times a day (QID) | RECTAL | Status: DC | PRN

## 2014-09-27 MED ORDER — ONDANSETRON HCL 4 MG PO TABS: 4.0000 mg | ORAL_TABLET | Freq: Four times a day (QID) | ORAL | Status: DC | PRN

## 2014-09-27 MED ORDER — HYDRALAZINE HCL 20 MG/ML IJ SOLN: 10.0000 mg | Freq: Four times a day (QID) | INTRAMUSCULAR | Status: DC | PRN

## 2014-09-27 MED ORDER — SODIUM CHLORIDE 0.9 % IJ SOLN
3.0000 mL | Freq: Two times a day (BID) | INTRAMUSCULAR | Status: DC
  Administered 2014-09-27: 3 mL via INTRAVENOUS

## 2014-09-27 MED ORDER — THIAMINE HCL 100 MG/ML IJ SOLN
100.0000 mg | Freq: Every day | INTRAMUSCULAR | Status: DC
  Filled 2014-09-27 (×3): qty 1

## 2014-09-27 MED ORDER — HYDRALAZINE HCL 50 MG PO TABS
50.0000 mg | ORAL_TABLET | Freq: Two times a day (BID) | ORAL | Status: DC
  Administered 2014-09-27 – 2014-09-28 (×3): 50 mg via ORAL
  Filled 2014-09-27 (×6): qty 1

## 2014-09-27 MED ORDER — OXYCODONE HCL 5 MG PO TABS: 5.0000 mg | ORAL_TABLET | ORAL | Status: DC | PRN

## 2014-09-27 MED ORDER — HYDROMORPHONE HCL 1 MG/ML IJ SOLN
0.5000 mg | INTRAMUSCULAR | Status: DC | PRN
  Administered 2014-09-28 (×2): 1 mg via INTRAVENOUS
  Filled 2014-09-27 (×2): qty 1

## 2014-09-27 MED ORDER — VITAMIN B-1 100 MG PO TABS
100.0000 mg | ORAL_TABLET | Freq: Every day | ORAL | Status: DC
  Administered 2014-09-27 – 2014-09-29 (×3): 100 mg via ORAL
  Filled 2014-09-27 (×3): qty 1

## 2014-09-27 MED ORDER — ISOSORBIDE MONONITRATE 15 MG HALF TABLET
15.0000 mg | ORAL_TABLET | Freq: Every day | ORAL | Status: DC
  Administered 2014-09-27 – 2014-09-28 (×2): 15 mg via ORAL
  Filled 2014-09-27 (×3): qty 1

## 2014-09-27 MED ORDER — SODIUM CHLORIDE 0.9 % IJ SOLN
3.0000 mL | Freq: Two times a day (BID) | INTRAMUSCULAR | Status: DC
  Administered 2014-09-27 – 2014-09-29 (×5): 3 mL via INTRAVENOUS

## 2014-09-27 MED ORDER — ENOXAPARIN SODIUM 40 MG/0.4ML ~~LOC~~ SOLN
40.0000 mg | SUBCUTANEOUS | Status: DC
  Administered 2014-09-27 – 2014-09-29 (×3): 40 mg via SUBCUTANEOUS
  Filled 2014-09-27 (×3): qty 0.4

## 2014-09-27 MED ORDER — ATORVASTATIN CALCIUM 20 MG PO TABS
20.0000 mg | ORAL_TABLET | Freq: Every day | ORAL | Status: DC
  Administered 2014-09-27 – 2014-09-29 (×3): 20 mg via ORAL
  Filled 2014-09-27 (×3): qty 1

## 2014-09-27 MED ORDER — PNEUMOCOCCAL VAC POLYVALENT 25 MCG/0.5ML IJ INJ
0.5000 mL | INJECTION | INTRAMUSCULAR | Status: DC
  Filled 2014-09-27: qty 0.5

## 2014-09-27 MED ORDER — ONDANSETRON HCL 4 MG/2ML IJ SOLN: 4.0000 mg | Freq: Four times a day (QID) | INTRAMUSCULAR | Status: DC | PRN

## 2014-09-27 MED ORDER — SODIUM CHLORIDE 0.9 % IJ SOLN: 3.0000 mL | INTRAMUSCULAR | Status: DC | PRN

## 2014-09-27 NOTE — ED Notes (Signed)
Pt unable to void edp aware

## 2014-09-27 NOTE — Evaluation (Signed)
Occupational Therapy Evaluation Patient Details Name: [name] MRN: [id number] DOB: [birth year] Today's Date: 09/27/2014    History of Present Illness 64 yo male with stroke history was admitted with LLE pain and foot drop occurring suddenly with negative imaging of brain, negative for L side lumbar spine and  positive CT head for old stroke changes.  PMHx:  L hemilaminectomy lumbar spine, R side disc bulges, HTN, CHF, CAD, PAD, PVD, blind L eye   Clinical Impression   Pt admitted with above. Pt independent with ADLs, PTA. Feel pt will benefit from acute OT to increase independence with BADLs and increase strength prior to d/c. Recommending CIR for rehab.    Follow Up Recommendations  CIR    Equipment Recommendations  Other (comment) (tbd)    Recommendations for Other Services       Precautions / Restrictions Precautions Precautions: Fall Restrictions Weight Bearing Restrictions: No      Mobility Bed Mobility Overal bed mobility: Needs Assistance Bed Mobility: Supine to Sit;Sit to Supine     Supine to sit: Supervision Sit to supine: Supervision   General bed mobility comments: cues for technique to get EOB  Transfers Overall transfer level: Needs assistance Equipment used: Rolling walker (2 wheeled) Transfers: Sit to/from [facility] Sit to Stand: Min assist;Mod assist Stand pivot transfers: Min assist       General transfer comment: cues for technique and hand placement; used elevated bed.         ADL Overall ADL's : Needs assistance/impaired     Grooming: Set up;Supervision/safety;Sitting               Lower Body Dressing: Moderate assistance;Sit to/from stand   Toilet Transfer: Minimal assistance;Stand-pivot;RW (from bed to chair)           Functional mobility during ADLs: Minimal assistance;Rolling walker (stand pivot and took side steps by bed) General ADL Comments: Educated on LB dressing technique. Pt did not feel he  could stand long enough for ADLs at sink level. Assist with donning left sock.     Vision  Per chart, blind in left eye (when asked, pt states he is not blind in one eye?)                   Perception     Praxis      Pertinent Vitals/Pain Pain Assessment: No/denies pain     Hand Dominance     Extremity/Trunk Assessment Upper Extremity Assessment Upper Extremity Assessment: Generalized weakness   Lower Extremity Assessment Lower Extremity Assessment: Defer to PT evaluation       Communication Communication Communication: Expressive difficulties (little slurred)   Cognition Arousal/Alertness: Awake/alert Behavior During Therapy: WFL for tasks assessed/performed Overall Cognitive Status: No family/caregiver present to determine baseline cognitive functioning (pt did not know current year or his birthday year)                     General Comments       Exercises       Shoulder Instructions      Home Living Family/patient expects to be discharged to:: Private residence Living Arrangements: Spouse/significant other Available Help at Discharge: Family Type of Home: House Home Access: Stairs to enter Technical brewer of Steps: 1 Entrance Stairs-Rails: None Home Layout: One level     Bathroom Shower/Tub: Teacher, early years/pre: Standard     Home Equipment: Environmental consultant - 2 wheels;Walker - 4 wheels;Bedside commode;Tub bench  Prior Functioning/Environment Level of Independence: Independent with assistive device(s)             OT Diagnosis: Generalized weakness   OT Problem List: Decreased strength;Decreased activity tolerance;Decreased knowledge of use of DME or AE;Decreased knowledge of precautions;Decreased cognition   OT Treatment/Interventions: Self-care/ADL training;Therapeutic exercise;DME and/or AE instruction;Therapeutic activities;Patient/family education;Balance training    OT Goals(Current goals can be  found in the care plan section) Acute Rehab OT Goals Patient Stated Goal: get back to work OT Goal Formulation: With patient Time For Goal Achievement: 10/04/14 Potential to Achieve Goals: Good ADL Goals Pt Will Perform Grooming: with set-up;standing;with supervision (3 tasks) Pt Will Perform Lower Body Dressing: with set-up;with supervision;sit to/from stand Pt Will Transfer to Toilet: with supervision;ambulating Pt Will Perform Toileting - Clothing Manipulation and hygiene: with supervision;sit to/from stand  OT Frequency: Min 2X/week   Barriers to D/C:            Co-evaluation              End of Session Equipment Utilized During Treatment: Gait belt;Rolling walker  Activity Tolerance: Patient tolerated treatment well Patient left: in chair;with call bell/phone within reach;with chair alarm set   Time: [id number] OT Time Calculation (min): 13 min Charges:  OT General Charges $OT Visit: 1 Procedure OT Evaluation $Initial OT Evaluation Tier I: 1 Procedure G-CodesBenito Mccreedy OTR/L [id number] 09/27/2014, 5:43 PM

## 2014-09-27 NOTE — ED Notes (Signed)
The pt returned from c-t 

## 2014-09-27 NOTE — ED Notes (Signed)
The pt is   09/27/14 0017  [place] Rounding  Intervention Call light w/in reach;Stretcher locked in lowest position  Assessment Alert

## 2014-09-27 NOTE — ED Notes (Signed)
Admitting doctor at the bedside 

## 2014-09-27 NOTE — Consult Note (Signed)
NEURO HOSPITALIST CONSULT NOTE    Reason for Consult: left foot drop  HPI:                                                                                                                                          [name] is an 64 y.o. male with a past medical history significant for HTN, hypercholesterolemia,  CAD, MI, chronic congestive heart failure,subcortical infarcts, PAD, left eye blindness, cones in complaining of acute onset left leg pain and left foot weakness. Stated that yesterday morning he started having a sharp pain in the left knee travelling to the left foot and subsequent inability to move the left foot. Never had similar symptoms before. Denies low back pain or trauma to the left LE or lower back. No numbness-tingling left LE. No HA, vertigo, double vision, difficulty swallowing, arms weakness, slurred speech, language or vision impairment, bladder or bowel dysfunction. CT head was reviewed by myself and showed no acute abnormality.  Past Medical History  Diagnosis Date  . Hypertension   . CHF (congestive heart failure)   . Coronary artery disease   . CVA (cerebral infarction)   . High cholesterol   . Peripheral vascular disease   . PAD (peripheral artery disease)   . Anginal pain   . Myocardial infarction ~ 2011  . Exertional dyspnea     "sometimes" (06/30/2012)  . Stroke ~ 2009    "left eye; totally blind there"   . Blind left eye ~ 2009    Past Surgical History  Procedure Laterality Date  . Cardiac catheterization    . Coronary artery bypass graft  ?2011  . Peripheral arterial stent graft  2000's    BLE    Family History  Problem Relation Age of Onset  . Diabetes Sister   . Heart disease Sister   . Heart disease Brother     Family History: no epilepsy, brain tumors, or brain aneurysms   Social History:  reports that he has been smoking Cigarettes.  He has a 5.76 pack-year smoking history. He has never used smokeless  tobacco. He reports that he drinks about 1.8 oz of alcohol per week. He reports that he uses illicit drugs ("Crack" cocaine).  No Known Allergies  MEDICATIONS:  I have reviewed the patient's current medications.   ROS:                                                                                                                                       History obtained from the patient and chart review  General ROS: negative for - chills, fatigue, fever, night sweats, weight gain or weight loss Psychological ROS: negative for - behavioral disorder, hallucinations, memory difficulties, mood swings or suicidal ideation Ophthalmic ROS: negative for - blurry vision, double vision, eye pain or loss of vision ENT ROS: negative for - epistaxis, nasal discharge, oral lesions, sore throat, tinnitus or vertigo Allergy and Immunology ROS: negative for - hives or itchy/watery eyes Hematological and Lymphatic ROS: negative for - bleeding problems, bruising or swollen lymph nodes Endocrine ROS: negative for - galactorrhea, hair pattern changes, polydipsia/polyuria or temperature intolerance Respiratory ROS: negative for - cough, hemoptysis, shortness of breath or wheezing Cardiovascular ROS: negative for - chest pain, dyspnea on exertion, edema or irregular heartbeat Gastrointestinal ROS: negative for - abdominal pain, diarrhea, hematemesis, nausea/vomiting or stool incontinence Genito-Urinary ROS: negative for - dysuria, hematuria, incontinence or urinary frequency/urgency Musculoskeletal ROS: negative for - joint swelling Neurological ROS: as noted in HPI Dermatological ROS: negative for rash and skin lesion changes  Physical exam: pleasant male in no apparent distress.Blood pressure 166/150, pulse 90, temperature 98.4 F (36.9 C), temperature source Oral, resp. rate 18, height _0   (1.803 m), SpO2 97 %. Head: normocephalic. Neck: supple, no bruits, no JVD. Cardiac: no murmurs. Lungs: clear. Abdomen: soft, no tender, no mass. Extremities: bilateral LE edema. Pain on palpation left knee. Skin: no rash Neurologic Examination:                                                                                                      General: Mental Status: Alert, oriented, thought content appropriate.  Speech fluent without evidence of aphasia.  Able to follow 3 step commands without difficulty. Cranial Nerves: II: Discs flat bilaterally; Visual fields grossly normal, pupils equal, round, reactive to light and accommodation III,IV, VI: ptosis not present, extra-ocular motions intact bilaterally V,VII: smile symmetric, facial light touch sensation normal bilaterally VIII: hearing normal bilaterally IX,X: gag reflex present XI: bilateral shoulder shrug XII: midline tongue extension without atrophy or fasciculations Motor: there is pain involved. Significant for 0/5 weakness left dorsiflexors, foot invertors and evertors as well as left extensor hallucis longus. 5/5 upper extremities and left UE. Tone and  bulk:normal tone throughout; no atrophy noted Sensory: Pinprick and light touch impaired in the lateral aspect left foot and distal leg as well as the left foot IV-V interspace.  Deep Tendon Reflexes:  Right: Upper Extremity   Left: Upper extremity   biceps (C-5 to C-6) 2/4   biceps (C-5 to C-6) 2/4 tricep (C7) 2/4    triceps (C7) 2/4 Brachioradialis (C6) 2/4  Brachioradialis (C6) 2/4  Lower Extremity Lower Extremity  quadriceps (L-2 to L-4) 2/4   quadriceps (L-2 to L-4) 2/4 Achilles (S1) 1/4   Achilles (S1) 0/4  Plantars: Right: downgoing   Left: downgoing Cerebellar: normal finger-to-nose,  normal heel-to-shin test in the right side. Can not perform HKS on he left due to pain and weakness, but FTN normal in he left. Gait:  Unable to test. CV: pulses palpable  throughout    Lab Results  Component Value Date/Time   CHOL  11/25/2009 04:40 AM    158        ATP III CLASSIFICATION:  <200     mg/dL   Desirable  200-239  mg/dL   Borderline High  >=240    mg/dL   High           Results for orders placed or performed during the hospital encounter of 09/26/14 (from the past 48 hour(s))  CBC with Differential     Status: None   Collection Time: 09/27/14 12:02 AM  Result Value Ref Range   WBC 8.0 4.0 - 10.5 K/uL   RBC 5.01 4.22 - 5.81 MIL/uL   Hemoglobin 13.6 13.0 - 17.0 g/dL   HCT 40.4 39.0 - 52.0 %   MCV 80.6 78.0 - 100.0 fL   MCH 27.1 26.0 - 34.0 pg   MCHC 33.7 30.0 - 36.0 g/dL   RDW 14.7 11.5 - 15.5 %   Platelets 255 150 - 400 K/uL   Neutrophils Relative % 56 43 - 77 %   Neutro Abs 4.5 1.7 - 7.7 K/uL   Lymphocytes Relative 33 12 - 46 %   Lymphs Abs 2.7 0.7 - 4.0 K/uL   Monocytes Relative 9 3 - 12 %   Monocytes Absolute 0.7 0.1 - 1.0 K/uL   Eosinophils Relative 2 0 - 5 %   Eosinophils Absolute 0.1 0.0 - 0.7 K/uL   Basophils Relative 0 0 - 1 %   Basophils Absolute 0.0 0.0 - 0.1 K/uL  Comprehensive metabolic panel     Status: Abnormal   Collection Time: 09/27/14 12:02 AM  Result Value Ref Range   Sodium 140 135 - 145 mmol/L   Potassium 3.8 3.5 - 5.1 mmol/L   Chloride 111 96 - 112 mmol/L   CO2 26 19 - 32 mmol/L   Glucose, Bld 110 (H) 70 - 99 mg/dL   BUN 14 6 - 23 mg/dL   Creatinine, Ser 1.37 (H) 0.50 - 1.35 mg/dL   Calcium 9.2 8.4 - 10.5 mg/dL   Total Protein 7.5 6.0 - 8.3 g/dL   Albumin 3.5 3.5 - 5.2 g/dL   AST 25 0 - 37 U/L   ALT 20 0 - 53 U/L   Alkaline Phosphatase 66 39 - 117 U/L   Total Bilirubin 0.6 0.3 - 1.2 mg/dL   GFR calc non Af Amer 54 (L) >90 mL/min   GFR calc Af Amer 62 (L) >90 mL/min    Comment: (NOTE) The eGFR has been calculated using the CKD EPI equation. This calculation has not been validated in all clinical  situations. eGFR's persistently <90 mL/min signify possible Chronic Kidney Disease.    Anion gap  3 (L) 5 - 15  CK     Status: None   Collection Time: 09/27/14 12:02 AM  Result Value Ref Range   Total CK 220 7 - 232 U/L    Ct Head Wo Contrast  09/27/2014   CLINICAL DATA:  Acute onset of right leg pain. Not fully oriented. Initial encounter.  EXAM: CT HEAD WITHOUT CONTRAST  TECHNIQUE: Contiguous axial images were obtained from the base of the skull through the vertex without intravenous contrast.  COMPARISON:  CT of the head performed 06/29/2012, and MRI of the brain performed 06/30/2012  FINDINGS: There is no evidence of acute infarction, mass lesion, or intra- or extra-axial hemorrhage on CT.  Prominence of the ventricles and sulci reflects mild to moderate cortical volume loss. Cerebellar atrophy is noted. Diffuse periventricular and subcortical white matter change likely reflects small vessel ischemic microangiopathy. Chronic infarcts are seen at the left frontal and high left parietal lobes, with associated encephalomalacia. A chronic infarct is also noted in the right cerebellar hemisphere. Small chronic infarcts are seen within the basal ganglia bilaterally.  The brainstem and fourth ventricle are within normal limits. No mass effect or midline shift is seen.  There is no evidence of fracture; visualized osseous structures are unremarkable in appearance. The orbits are within normal limits. Mucosal thickening is noted at the right maxillary sinus. The remaining paranasal sinuses and mastoid air cells are well-aerated. No significant soft tissue abnormalities are seen.  IMPRESSION: 1. No acute intracranial pathology seen on CT. 2. Mild to moderate cortical volume loss and diffuse small vessel ischemic microangiopathy. 3. Chronic infarcts of the left frontal and left parietal lobes, with associated encephalomalacia. 4. Chronic infarct at the right cerebellar hemisphere. Small chronic infarcts within the basal ganglia bilaterally. 5. Mucosal thickening at the right maxillary sinus.   Electronically  Signed   By: Garald Balding M.D.   On: 09/27/2014 02:26   Assessment/Plan: 63y/o presents with acute onset left foot drop and pain left knee traveling to the left foot. 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 Patient said that he can not ambulate at this moment. Ordered MRI L-spine and left knee. PT. Will need orthotics left foot. Will follow up.   Dorian Pod, MD 09/27/2014, 3:43 AM  [facility]

## 2014-09-27 NOTE — Evaluation (Addendum)
Physical Therapy Evaluation Patient Details Name: [name] MRN: [id number] DOB: [birth year] Today's Date: 2014-10-28   History of Present Illness     Clinical Impression  Pt was seen for evaluation of his physical function and have noted dense weakness of LLE that will necessitate inpt therapy due to his lack of gait.  He is home with wife but will not be ambulatory and has a step to enter house with no rails.  Will plan to follow him here but asked for inpt therapy to assess him (CIR).    Follow Up Recommendations      Equipment Recommendations       Recommendations for Other Services       Precautions / Restrictions Restrictions Weight Bearing Restrictions: No      Mobility  Bed Mobility                  Transfers                    Ambulation/Gait                Stairs            Wheelchair Mobility    Modified Rankin (Stroke Patients Only)       Balance                                             Pertinent Vitals/Pain      Home Living                        Prior Function                 Hand Dominance        Extremity/Trunk Assessment                         Communication      Cognition                            General Comments      Exercises        Assessment/Plan    PT Assessment    PT Diagnosis     PT Problem List    PT Treatment Interventions     PT Goals (Current goals can be found in the Care Plan section)      Frequency     Barriers to discharge        Co-evaluation               End of Session                 Time:  -      Charges:         PT G Codes:        Ramond Dial Oct 28, 2014, 11:27 AM   Mee Hives, PT MS Acute Rehab Dept. Number: [phone]

## 2014-09-27 NOTE — Plan of Care (Signed)
Problem: Acute Rehab PT Goals(only PT should resolve) Goal: Pt Will Transfer Bed To Chair/Chair To Bed On LRAD

## 2014-09-27 NOTE — H&P (Signed)
[facility] Admission History and Physical       [name] [id number] DOB: [birth year] DOA: 09/26/2014  Referring physician: EDP PCP: Arthur Fendt, MD  Specialists:   Chief Complaint: Left foot Drop  HPI: [name] is a 64 y.o. male with a history of Multiple CVAs, Uncontrolled HTN, CAD, Hyperlipidemia, and Cocaine Abuse who presents to the ED with complaints of a left  foot drop he notices since 4 pm.   He denies any trauma or back pain.   He denies any headache or fevers or chills.  He denies any recent use of Illicit drugs.   A Ct scan of the Head was performed and was negative for acute findings, and Neurology was consulted.     Review of Systems:  Constitutional: No Weight Loss, No Weight Gain, Night Sweats, Fevers, Chills, Dizziness, Fatigue, or Generalized Weakness HEENT: No Headaches, Difficulty Swallowing,Tooth/Dental Problems,Sore Throat,  No Sneezing, Rhinitis, Ear Ache, Nasal Congestion, or Post Nasal Drip,  Cardio-vascular:  No Chest pain, Orthopnea, PND, Edema in Lower Extremities, Anasarca, Dizziness, Palpitations  Resp: No Dyspnea, No DOE, No Productive Cough, No Non-Productive Cough, No Hemoptysis, No Wheezing.    GI: No Heartburn, Indigestion, Abdominal Pain, Nausea, Vomiting, Diarrhea, Hematemesis, Hematochezia, Melena, Change in Bowel Habits,  Loss of Appetite  GU: No Dysuria, Change in Color of Urine, No Urgency or Frequency, No Flank pain.  Musculoskeletal: No Joint Pain or Swelling, No Decreased Range of Motion, No Back Pain.  Neurologic: No Syncope, No Seizures, Muscle Weakness, Paresthesia, Vision Disturbance or Loss, No Diplopia, No Vertigo, +Difficulty Walking,  Skin: No Rash or Lesions. Psych: No Change in Mood or Affect, No Depression or Anxiety, No Memory loss, No Confusion, or Hallucinations   Past Medical History  Diagnosis Date  . Hypertension   . CHF (congestive heart failure)   . Coronary artery disease   . CVA (cerebral  infarction)   . High cholesterol   . Peripheral vascular disease   . PAD (peripheral artery disease)   . Anginal pain   . Myocardial infarction ~ 2011  . Exertional dyspnea     "sometimes" (06/30/2012)  . Stroke ~ 2009    "left eye; totally blind there"   . Blind left eye ~ 2009      Past Surgical History  Procedure Laterality Date  . Cardiac catheterization    . Coronary artery bypass graft  ?2011  . Peripheral arterial stent graft  2000's    BLE       Prior to Admission medications   Medication Sig Start Date End Date Taking? Authorizing Provider  traMADol (ULTRAM) 50 MG tablet Take 1 tablet (50 mg total) by mouth every 6 (six) hours as needed for pain. For pain 07/01/12  Yes Ripudeep Krystal Eaton, MD  aspirin EC 81 MG tablet Take 1 tablet (81 mg total) by mouth daily. Patient not taking: Reported on 09/27/2014 07/01/12   Ripudeep Krystal Eaton, MD  atorvastatin (LIPITOR) 20 MG tablet Take 20 mg by mouth daily.    Historical Provider, MD  hydrALAZINE (APRESOLINE) 50 MG tablet Take 1 tablet (50 mg total) by mouth 2 (two) times daily. Patient not taking: Reported on 09/27/2014 07/01/12   Ripudeep Krystal Eaton, MD  lisinopril (PRINIVIL,ZESTRIL) 10 MG tablet Take 2 tablets (20 mg total) by mouth daily. Patient not taking: Reported on 09/27/2014 07/01/12   Ripudeep Krystal Eaton, MD      No Known Allergies   Social History:  reports that he has been  smoking Cigarettes.  He has a 5.76 pack-year smoking history. He has never used smokeless tobacco. He reports that he drinks about 1.8 oz of alcohol per week. He reports that he uses illicit drugs ("Crack" cocaine).     Family History  Problem Relation Age of Onset  . Diabetes Sister   . Heart disease Sister   . Heart disease Brother        Physical Exam:  GEN: Obese Disheveled Appearing  64 y.o. African American male examined and in no acute distress; cooperative with exam Filed Vitals:   09/27/14 0245 09/27/14 0308 09/27/14 0330 09/27/14 0350  BP:  166/150 166/150 152/97   Pulse: 95 90 92   Temp:    98 F (36.7 C)  TempSrc:      Resp:  18    Height:      SpO2: 97% 97% 97%    Blood pressure 152/97, pulse 92, temperature 98 F (36.7 C), temperature source Oral, resp. rate 18, height 5\' 11"  (1.803 m), SpO2 97 %. PSYCH: He is alert and oriented x4; does not appear anxious does not appear depressed; affect is normal HEENT: Normocephalic and Atraumatic, Mucous membranes pink; PERRLA; EOM intact; Fundi:  Benign;  No scleral icterus, Nares: Patent, Oropharynx: Clear, Poor sparse Decaying Dentition,    Neck:  FROM, No Cervical Lymphadenopathy nor Thyromegaly or Carotid Bruit; No JVD; Breasts:: Not examined CHEST WALL: No tenderness CHEST: Normal respiration, clear to auscultation bilaterally HEART: Regular rate and rhythm; no murmurs rubs or gallops BACK: No kyphosis or scoliosis; No CVA tenderness ABDOMEN: Positive Bowel Sounds, Obese, Soft Non-Tender; No Masses, No Organomegaly Rectal Exam: Not done EXTREMITIES: + Atrophied LLE with foot Drop present,  RLE:   No Cyanosis, Clubbing, or Edema; No Ulcerations. Genitalia: not examined PULSES: 2+ and symmetric SKIN: Normal hydration no rash or ulceration  CNS:   Mental Status:  Alert, Oriented, Thought Content Appropriate. Speech Fluent with Dysarthria,  Able to follow 3 step commands without difficulty.  In No obvious pain.   Cranial Nerves: Intact except for Blindness in th e Left Eye Motor:  Right:  Upper extremity 5/5     Left:  Upper extremity 5/5     Right:  Lower extremity 5/5    Left:  Lower extremity 0/5     Tone and Bulk:  normal tone throughout; +Arophy noted of the LLE   Sensory:  Pinprick and light touch intact throughout, bilaterally   Deep Tendon Reflexes: 2+ and symmetric throughout   Plantars/ Babinski:  Right: normal Left: equivocal   Cerebellar:  Intact.   Gait: deferred   Vascular: Dorsal Pedis Pulses palpable     Labs on Admission:  Basic Metabolic  Panel:  Recent Labs Lab 09/27/14 0002  NA 140  K 3.8  CL 111  CO2 26  GLUCOSE 110*  BUN 14  CREATININE 1.37*  CALCIUM 9.2   Liver Function Tests:  Recent Labs Lab 09/27/14 0002  AST 25  ALT 20  ALKPHOS 66  BILITOT 0.6  PROT 7.5  ALBUMIN 3.5   No results for input(s): LIPASE, AMYLASE in the last 168 hours. No results for input(s): AMMONIA in the last 168 hours. CBC:  Recent Labs Lab 09/27/14 0002  WBC 8.0  NEUTROABS 4.5  HGB 13.6  HCT 40.4  MCV 80.6  PLT 255   Cardiac Enzymes:  Recent Labs Lab 09/27/14 0002  CKTOTAL 220    BNP (last 3 results) No results for input(s): PROBNP in the  last 8760 hours. CBG: No results for input(s): GLUCAP in the last 168 hours.  Radiological Exams on Admission: Ct Head Wo Contrast  09/27/2014   CLINICAL DATA:  Acute onset of right leg pain. Not fully oriented. Initial encounter.  EXAM: CT HEAD WITHOUT CONTRAST  TECHNIQUE: Contiguous axial images were obtained from the base of the skull through the vertex without intravenous contrast.  COMPARISON:  CT of the head performed 06/29/2012, and MRI of the brain performed 06/30/2012  FINDINGS: There is no evidence of acute infarction, mass lesion, or intra- or extra-axial hemorrhage on CT.  Prominence of the ventricles and sulci reflects mild to moderate cortical volume loss. Cerebellar atrophy is noted. Diffuse periventricular and subcortical white matter change likely reflects small vessel ischemic microangiopathy. Chronic infarcts are seen at the left frontal and high left parietal lobes, with associated encephalomalacia. A chronic infarct is also noted in the right cerebellar hemisphere. Small chronic infarcts are seen within the basal ganglia bilaterally.  The brainstem and fourth ventricle are within normal limits. No mass effect or midline shift is seen.  There is no evidence of fracture; visualized osseous structures are unremarkable in appearance. The orbits are within normal  limits. Mucosal thickening is noted at the right maxillary sinus. The remaining paranasal sinuses and mastoid air cells are well-aerated. No significant soft tissue abnormalities are seen.  IMPRESSION: 1. No acute intracranial pathology seen on CT. 2. Mild to moderate cortical volume loss and diffuse small vessel ischemic microangiopathy. 3. Chronic infarcts of the left frontal and left parietal lobes, with associated encephalomalacia. 4. Chronic infarct at the right cerebellar hemisphere. Small chronic infarcts within the basal ganglia bilaterally. 5. Mucosal thickening at the right maxillary sinus.   Electronically Signed   By: Garald Balding M.D.   On: 09/27/2014 02:26     EKG: Independently reviewed.    Assessment/Plan:   64 y.o. male with  Principal Problem:   1.    Foot drop, left- Appears Chronic, CT head Reveals Old Infarcts after review of CT scan from 09/2008, and 06/2012, and on 01/24 a  Chronic Infarct of the right Cerebellum   Neurology consulted being seen by Dr. Aram Beecham   Neuro checks   Physical therapy Evaluation   May need MRI  Of L-Spine          Active Problems:   2.   Uncontrolled hypertension- due to Noncompliance, and Cocaine Abuse   Hydralazine changed to 25 mg PO q 6 hours   PRN IV Hydralazine for SBP > 170        3.   Polysubstance abuse   Check UDS   Counseled   May Need Psych/ Social services  Consult     4.   Peripheral vascular disease   Distal Pulses Intact           5.  DVT Prophylaxis   Lovenox      Code Status:     FULL CODE Family Communication:    No Family Present Disposition Plan:  Observation   Time spent:  37 MInutes  Theressa Millard [facility] Pager [phone]   If Kyle Please Contact the Day Rounding Team MD for [facility]  If 7PM-7AM, Please Contact Night-Floor Coverage  [email or web address] Password [facility] 09/27/2014, 3:54 AM

## 2014-09-27 NOTE — Progress Notes (Signed)
Orthopedic Tech Progress Note Patient Details:  [name] [birth year] [id number] [place] called for brace order Patient ID: [name], male   DOB: 1951/08/14, 64 y.o.   MRN: [id number]   [name] 09/27/2014, 5:05 PM

## 2014-09-27 NOTE — Progress Notes (Signed)
NURSING PROGRESS NOTE  Sasuke Rhyan [id number] Admission Data: 09/27/2014 7:04 AM Attending Provider: Theressa Millard, MD [id number], MD Code Status: Full  Kenyan Muzik is a 64 y.o. male patient admitted from ED:  -No acute distress noted.  -No complaints of shortness of breath.  -No complaints of chest pain.   Cardiac Monitoring: Box # 23 in place.   Blood pressure 170/92, pulse 94, temperature 98.4 F (36.9 C), temperature source Oral, resp. rate 18, height 5\' 11"  (1.803 m), weight 99.4 kg (219 lb 2.2 oz), SpO2 100 %.   IV Fluids:  IV in place, occlusive dsg intact without redness, IV cath hand left, condition patent and no redness none.   Allergies:  Review of patient's allergies indicates no known allergies.  Past Medical History:   has a past medical history of Hypertension; CHF (congestive heart failure); Coronary artery disease; CVA (cerebral infarction); High cholesterol; Peripheral vascular disease; PAD (peripheral artery disease); Anginal pain; Myocardial infarction (~ 2011); Exertional dyspnea; Stroke (~ 2009); and Blind left eye (~ 2009).  Past Surgical History:   has past surgical history that includes Cardiac catheterization; Coronary artery bypass graft (?2011); and Peripheral arterial stent graft (2000's).  Social History:   reports that he has been smoking Cigarettes.  He has a 5.76 pack-year smoking history. He has never used smokeless tobacco. He reports that he drinks about 1.8 oz of alcohol per week. He reports that he uses illicit drugs ("Crack" cocaine).  Skin: WDL  Patient oriented to room. Information packet given to patient. Admission inpatient armband information verified with patient to include name and date of birth and placed on patient arm. Side rails up x 2, fall assessment and education completed with patient. Patient able to verbalize understanding of risk associated with falls and verbalized understanding to call for assistance before  getting out of bed. Call light within reach. Patient able to voice and demonstrate understanding of unit orientation instructions.

## 2014-09-27 NOTE — ED Notes (Signed)
The pt has a dopplered pulse in the lt foot .  Foot color is normal and so is the temp

## 2014-09-27 NOTE — ED Notes (Signed)
To mri 

## 2014-09-27 NOTE — Progress Notes (Signed)
Ortho tech called and AFO ordered. It may be tomorrow until boot arrives.

## 2014-09-27 NOTE — ED Notes (Signed)
Report given to rn on 5 w 

## 2014-09-27 NOTE — Progress Notes (Signed)
UR completed 

## 2014-09-27 NOTE — Progress Notes (Signed)
I note that we have received an order for IP Rehab consult.  I  will await input from Rehab MD and proceed according to his recommendations.  Please note pt. is currently under observation status.  Please call if questions.  [facility] Admissions Coordinator Cell [phone] Office [phone]

## 2014-09-27 NOTE — Progress Notes (Signed)
*  PRELIMINARY RESULTS* Echocardiogram 2D Echocardiogram has been performed.  Leavy Cella 09/27/2014, 11:22 AM

## 2014-09-27 NOTE — ED Notes (Signed)
The pt passed his swallow screen.  2 unsuccessful attempts to start iv

## 2014-09-27 NOTE — Progress Notes (Signed)
Subjective: No improvement  Exam: Filed Vitals:   09/27/14 1330  BP: 105/61  Pulse: 106  Temp: 98.3 F (36.8 C)  Resp: 18   Gen: In bed, NAD MS: Awake, alert, oriented to place but not year.  CN:VFF, face symmetric Motor: He has weakness of the ankle dorsiflexion, inversion, and eversion. He has preserved plantarflexion. He has some possible weakness of knee flexion as well though effort is not clear.  Sensory:decreased predominantly on the lateral aspect of his leg, preserved sensation on the medial aspect of his foot and plantar aspect. He also has some decreaesed sensation around scars.    Impression: 64 yo M With sudden onset ankle weakness. The inclusion of foot inversion makes me think that this is L5 radiculopathy as opposed to peroneal neuropathy. This is complicated by some areas of numbness due to previous injury. 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 This does not always recover.   Recommendations: 1) F/u with neurology for EMG as outpatient 2) Continue PT 3) Neurology will sign off at this time. Please call with any further questions.    Roland Rack, MD [facility] [phone]  If 7pm- 7am, please page neurology on call as listed in [place].

## 2014-09-27 NOTE — Progress Notes (Signed)
[name] APP Team - Progress Note  [name] [id number] DOB: [birth year] DOA: 09/26/2014 PCP: Philis Fendt, MD   Brief narrative: 64 year male patient with history of hypertension and dyslipidemia as well as ongoing alcohol abuse, CAD, heart failure and peripheral arterial disease. He presents with acute onset left leg pain and left foot weakness started about 24 hours prior to presentation. Began as a sharp pain in the left knee that radiates to the left foot was unable to move the left foot. Denied similar symptoms. CT of the head in the ER was unremarkable.  Since admission he has undergone MRI of the lumbar spine that did not demonstrate any acute changes to explain the patient's symptoms. MRI of the left knee was normal. It is suspected that the patient's foot drop is chronic in nature. Neurology has evaluated the patient and suspects left peroneal neuropathy which should gradually improve in 4-6 weeks in setting of normal MRI knee. Patient having significant difficulty ambulating since physical therapy has been consulted.  HPI/Subjective: Still endorsing left foot pain and weakness; admits to drinking a 12 pack of beer per day states last drink was 2 days prior  Assessment/Plan: Active Problems:   Uncontrolled hypertension -Likely related to noncompliance with medication and possible early alcohol withdrawal -Have resumed home medications of lisinopril and twice a day hydralazine -We'll begin low-dose Imdur for afterload reduction    Polysubstance abuse -Primarily alcohol -Urine drug screen negative for cocaine this admission -Drinks at least a 12 pack of beer per day so we'll begin CIWA protocol -We'll check an anemia panel to ensure folate and B12 levels are appropriate; he place if indicated    Peripheral vascular disease    Dyslipidemia -Continue Lipitor -CPK 227 down etiology to patient's lower extremity symptoms    Foot drop, left -Appreciate neurologist  assistance -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 Wall thickness was increased in a pattern of mild LVH. The estimated ejection fraction was 55%. Wall motion was normal; there were no regional wall motion abnormalities. Doppler parameters are consistent with abnormal left ventricular relaxation (grade 1 diastolic dysfunction). - Left atrium: The atrium was mildly dilated. - Right ventricle: The cavity size was normal. Systolic function was normal.  Cultures: None  Antibiotics: None  Objective: Blood pressure 105/61, pulse 106, temperature 98.3 F (36.8 C), temperature source Oral, resp. rate 18, height 5\' 11"  (1.803 m), weight 219 lb 2.2 oz (99.4 kg), SpO2 96 %.  Intake/Output Summary (Last 24 hours) at 09/27/14 1509 Last data filed at 09/27/14 1129  Gross per 24 hour  Intake    220 ml  Output      0 ml  Net    220 ml     Exam: Follow-up exam completed noting patient admitted at 3:54 AM today  Scheduled Meds:  Scheduled Meds: . atorvastatin  20 mg Oral Daily  . enoxaparin (LOVENOX) injection  40 mg Subcutaneous Q24H  . folic acid  1 mg Oral Daily  . hydrALAZINE  50 mg Oral BID  . isosorbide mononitrate  15 mg Oral Daily  . lisinopril  20 mg Oral Daily  . multivitamin with minerals  1 tablet Oral Daily  . [START ON 09/28/2014] pneumococcal 23 valent vaccine  0.5 mL Intramuscular  Tomorrow-1000  . sodium chloride  3 mL Intravenous Q12H  . sodium chloride  3 mL Intravenous Q12H  . thiamine  100 mg Oral Daily   Or  . thiamine  100 mg Intravenous Daily   Continuous Infusions:   Data Reviewed: Basic Metabolic Panel:  Recent Labs Lab 09/27/14 0002 09/27/14 0818  NA 140 141  K 3.8 3.4*  CL 111 108  CO2 26 20  GLUCOSE 110* 95  BUN 14 10  CREATININE 1.37* 1.16  CALCIUM 9.2 9.3   Liver Function Tests:  Recent Labs Lab 09/27/14 0002  AST 25  ALT 20  ALKPHOS 66  BILITOT 0.6  PROT 7.5  ALBUMIN 3.5   No results for input(s): LIPASE, AMYLASE in the last 168 hours. No results for input(s): AMMONIA in the last 168 hours. CBC:  Recent Labs Lab 09/27/14 0002 09/27/14 0818  WBC 8.0 7.3  NEUTROABS 4.5  --   HGB 13.6 15.1  HCT 40.4 44.9  MCV 80.6 81.5  PLT 255 252   Cardiac Enzymes:  Recent Labs Lab 09/27/14 0002  CKTOTAL 220   BNP (last 3 results) No results for input(s): PROBNP in the last 8760 hours. CBG: No results for input(s): GLUCAP in the last 168 hours.  No results found for this or any previous visit (from the past 240 hour(s)).   Studies:  Recent x-ray studies have been reviewed in detail by the Attending Physician  Time spent :      Erin Hearing, [place] [facility] Office  [phone] Pager [phone]   **If unable to reach the above provider after paging please contact the [facility] @ [phone]  On-Call/Text Page:      [email or web address]      password [email or web address]  If 7PM-7AM, please contact night-coverage [email or web address] Password [email or web address] 09/27/2014, 3:09 PM   LOS: 1 day   I have directly reviewed the clinical findings, lab results and imaging studies. I have interviewed and examined the patient and agree with the documentation and management as recorded by the Physician extender.  Eyana Stolze M.D on 09/27/2014 at 4:50 PM  [facility] Office  [phone]

## 2014-09-27 NOTE — ED Notes (Signed)
The pt reports that he does not need to void at present

## 2014-09-27 NOTE — Consult Note (Signed)
Physical Medicine and Rehabilitation Consult  Reason for Consult: LLE weakness Referring Physician: Dr. Jana Hakim.    HPI: [name] is a 64 y.o. male with history of HTN, CAD, PVD, CVA, polysubstance abuse who was admitted on 09/27/14 with one day history of left knee and thigh pain radiating to left foot with subsequent inability to move left foot. CT head negative for acute changes. MRI left knee negative and MRI lumbar spine with moderate left L5-S1 HNP compressing thecal sac and Left S1 nerve root. Neurology feels that patient likely with L5 radiculopathy v/s diabetic amyotrophy and EMG recommended for workup as outpatient. PT/OT evaluations done and patient noted to have significant gait impairments. CIR recommended for follow up therapy.    Review of Systems  Musculoskeletal: Negative.        Very poor historian      Past Medical History  Diagnosis Date  . Hypertension   . CHF (congestive heart failure)   . Coronary artery disease   . CVA (cerebral infarction)   . High cholesterol   . Peripheral vascular disease   . PAD (peripheral artery disease)   . Anginal pain   . Myocardial infarction ~ 2011  . Exertional dyspnea     "sometimes" (06/30/2012)  . Stroke ~ 2009    "left eye; totally blind there"   . Blind left eye ~ 2009  . DVT (deep venous thrombosis)     Past Surgical History  Procedure Laterality Date  . Cardiac catheterization    . Coronary artery bypass graft  ?2011  . Peripheral arterial stent graft  2000's    BLE    Family History  Problem Relation Age of Onset  . Diabetes Sister   . Heart disease Sister   . Heart disease Brother     Social History:  reports that he has been smoking Cigarettes.  He has a 5.76 pack-year smoking history. He has never used smokeless tobacco. He reports that he drinks about 1.8 oz of alcohol per week. He reports that he uses illicit drugs ("Crack" cocaine).    Allergies: No Known Allergies     Medications Prior to Admission  Medication Sig Dispense Refill  . traMADol (ULTRAM) 50 MG tablet Take 1 tablet (50 mg total) by mouth every 6 (six) hours as needed for pain. For pain 60 tablet 3  . aspirin EC 81 MG tablet Take 1 tablet (81 mg total) by mouth daily. (Patient not taking: Reported on 09/27/2014) 30 tablet 3  . atorvastatin (LIPITOR) 20 MG tablet Take 20 mg by mouth daily.    . hydrALAZINE (APRESOLINE) 50 MG tablet Take 1 tablet (50 mg total) by mouth 2 (two) times daily. (Patient not taking: Reported on 09/27/2014) 60 tablet 3  . lisinopril (PRINIVIL,ZESTRIL) 10 MG tablet Take 2 tablets (20 mg total) by mouth daily. (Patient not taking: Reported on 09/27/2014) 60 tablet 3    Home: [place] expects to be discharged to:: Private residence Living Arrangements: Spouse/significant other Available Help at Discharge: Family Type of Home: House Home Access: Stairs to enter Technical brewer of Steps: 1 Entrance Stairs-Rails: None Home Layout: One level [place]: Environmental consultant - 2 wheels, Environmental consultant - 4 wheels, Shower seat, Bedside commode  Functional History: Prior Function Level of Independence: Independent with assistive device(s) Functional Status:  Mobility: Bed Mobility Overal bed mobility: Needs Assistance Bed Mobility: Supine to Sit Supine to sit: Min assist General bed mobility comments: helped under trunk and  to assist end of scooting out to edge with bed pad Transfers Overall transfer level: Needs assistance Equipment used: Rolling walker (2 wheeled) Transfers: Sit to/from Stand, Stand Pivot Transfers Sit to Stand: Max assist, From elevated surface Stand pivot transfers: Total assist, From elevated surface General transfer comment: Pt cannot take enough of a sidestep on walker to get to chair so slid on bed to drop arm chair Ambulation/Gait General Gait Details: unable to functionally walk, just attempted    ADL:     Cognition: Cognition Overall Cognitive Status: No family/caregiver present to determine baseline cognitive functioning Orientation Level: Oriented X4 Cognition Arousal/Alertness: Awake/alert Behavior During Therapy: WFL for tasks assessed/performed Overall Cognitive Status: No family/caregiver present to determine baseline cognitive functioning Memory: Decreased recall of precautions  Blood pressure 105/61, pulse 106, temperature 98.3 F (36.8 C), temperature source Oral, resp. rate 18, height 5\' 11"  (1.803 m), weight 99.4 kg (219 lb 2.2 oz), SpO2 96 %. Physical Exam  Neurological:  0/5 strength with lefty ADF/AI/AE. Hip abduction 3+/5. APF 4/5. KE and HF 4/5. Decreased sensation over the left lateral foot. No obvious sensory loss RLE.    Results for orders placed or performed during the hospital encounter of 09/26/14 (from the past 24 hour(s))  CBC with Differential     Status: None   Collection Time: 09/27/14 12:02 AM  Result Value Ref Range   WBC 8.0 4.0 - 10.5 K/uL   RBC 5.01 4.22 - 5.81 MIL/uL   Hemoglobin 13.6 13.0 - 17.0 g/dL   HCT 40.4 39.0 - 52.0 %   MCV 80.6 78.0 - 100.0 fL   MCH 27.1 26.0 - 34.0 pg   MCHC 33.7 30.0 - 36.0 g/dL   RDW 14.7 11.5 - 15.5 %   Platelets 255 150 - 400 K/uL   Neutrophils Relative % 56 43 - 77 %   Neutro Abs 4.5 1.7 - 7.7 K/uL   Lymphocytes Relative 33 12 - 46 %   Lymphs Abs 2.7 0.7 - 4.0 K/uL   Monocytes Relative 9 3 - 12 %   Monocytes Absolute 0.7 0.1 - 1.0 K/uL   Eosinophils Relative 2 0 - 5 %   Eosinophils Absolute 0.1 0.0 - 0.7 K/uL   Basophils Relative 0 0 - 1 %   Basophils Absolute 0.0 0.0 - 0.1 K/uL  Comprehensive metabolic panel     Status: Abnormal   Collection Time: 09/27/14 12:02 AM  Result Value Ref Range   Sodium 140 135 - 145 mmol/L   Potassium 3.8 3.5 - 5.1 mmol/L   Chloride 111 96 - 112 mmol/L   CO2 26 19 - 32 mmol/L   Glucose, Bld 110 (H) 70 - 99 mg/dL   BUN 14 6 - 23 mg/dL   Creatinine, Ser 1.37 (H) 0.50 - 1.35  mg/dL   Calcium 9.2 8.4 - 10.5 mg/dL   Total Protein 7.5 6.0 - 8.3 g/dL   Albumin 3.5 3.5 - 5.2 g/dL   AST 25 0 - 37 U/L   ALT 20 0 - 53 U/L   Alkaline Phosphatase 66 39 - 117 U/L   Total Bilirubin 0.6 0.3 - 1.2 mg/dL   GFR calc non Af Amer 54 (L) >90 mL/min   GFR calc Af Amer 62 (L) >90 mL/min   Anion gap 3 (L) 5 - 15  CK     Status: None   Collection Time: 09/27/14 12:02 AM  Result Value Ref Range   Total CK 220 7 - 232 U/L  Basic metabolic panel     Status: Abnormal   Collection Time: 09/27/14  8:18 AM  Result Value Ref Range   Sodium 141 135 - 145 mmol/L   Potassium 3.4 (L) 3.5 - 5.1 mmol/L   Chloride 108 96 - 112 mmol/L   CO2 20 19 - 32 mmol/L   Glucose, Bld 95 70 - 99 mg/dL   BUN 10 6 - 23 mg/dL   Creatinine, Ser 1.16 0.50 - 1.35 mg/dL   Calcium 9.3 8.4 - 10.5 mg/dL   GFR calc non Af Amer 66 (L) >90 mL/min   GFR calc Af Amer 76 (L) >90 mL/min   Anion gap 13 5 - 15  CBC     Status: None   Collection Time: 09/27/14  8:18 AM  Result Value Ref Range   WBC 7.3 4.0 - 10.5 K/uL   RBC 5.51 4.22 - 5.81 MIL/uL   Hemoglobin 15.1 13.0 - 17.0 g/dL   HCT 44.9 39.0 - 52.0 %   MCV 81.5 78.0 - 100.0 fL   MCH 27.4 26.0 - 34.0 pg   MCHC 33.6 30.0 - 36.0 g/dL   RDW 14.7 11.5 - 15.5 %   Platelets 252 150 - 400 K/uL  Urine rapid drug screen (hosp performed)     Status: Abnormal   Collection Time: 09/27/14  8:19 AM  Result Value Ref Range   Opiates POSITIVE (A) NONE DETECTED   Cocaine NONE DETECTED NONE DETECTED   Benzodiazepines NONE DETECTED NONE DETECTED   Amphetamines NONE DETECTED NONE DETECTED   Tetrahydrocannabinol NONE DETECTED NONE DETECTED   Barbiturates NONE DETECTED NONE DETECTED   Ct Head Wo Contrast  09/27/2014   CLINICAL DATA:  Acute onset of right leg pain. Not fully oriented. Initial encounter.  EXAM: CT HEAD WITHOUT CONTRAST  TECHNIQUE: Contiguous axial images were obtained from the base of the skull through the vertex without intravenous contrast.  COMPARISON:   CT of the head performed 06/29/2012, and MRI of the brain performed 06/30/2012  FINDINGS: There is no evidence of acute infarction, mass lesion, or intra- or extra-axial hemorrhage on CT.  Prominence of the ventricles and sulci reflects mild to moderate cortical volume loss. Cerebellar atrophy is noted. Diffuse periventricular and subcortical white matter change likely reflects small vessel ischemic microangiopathy. Chronic infarcts are seen at the left frontal and high left parietal lobes, with associated encephalomalacia. A chronic infarct is also noted in the right cerebellar hemisphere. Small chronic infarcts are seen within the basal ganglia bilaterally.  The brainstem and fourth ventricle are within normal limits. No mass effect or midline shift is seen.  There is no evidence of fracture; visualized osseous structures are unremarkable in appearance. The orbits are within normal limits. Mucosal thickening is noted at the right maxillary sinus. The remaining paranasal sinuses and mastoid air cells are well-aerated. No significant soft tissue abnormalities are seen.  IMPRESSION: 1. No acute intracranial pathology seen on CT. 2. Mild to moderate cortical volume loss and diffuse small vessel ischemic microangiopathy. 3. Chronic infarcts of the left frontal and left parietal lobes, with associated encephalomalacia. 4. Chronic infarct at the right cerebellar hemisphere. Small chronic infarcts within the basal ganglia bilaterally. 5. Mucosal thickening at the right maxillary sinus.   Electronically Signed   By: Garald Balding M.D.   On: 09/27/2014 02:26   Mr Lumbar Spine Wo Contrast  09/27/2014   CLINICAL DATA:  64 year old male with the left footdrop since yesterday. Denies trauma or back pain.  EXAM: MRI LUMBAR SPINE WITHOUT CONTRAST  TECHNIQUE: Multiplanar, multisequence MR imaging of the lumbar spine was performed. No intravenous contrast was administered.  COMPARISON:  None.  FINDINGS: Exam is slightly motion  degraded.  Last fully open disk space is labeled L5-S1. Present examination incorporates from T11-12 disc space through the S2-S3 level.  Conus L1 level.  Atherosclerotic type changes aorta and iliac arteries without aneurysmal dilation.  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 Additionally, prominent epidural fat further contributes to baseline thecal sac narrowing. Additionally:  T11-12 through L1-2 unremarkable.  L2-3:  Mild facet joint degenerative changes.  L3-4: Bulge with greatest extension right lateral position with slight impression upon the exiting right L3 nerve root. Mild facet joint degenerative changes.  L4-5: Baseline thecal sac narrowing secondary to short pedicles and prominent epidural fat with superimposed bulge and mild facet joint degenerative changes further causing slight narrowing of the thecal sac minimally more notable on the right.  L5-S1: Prominent disc degeneration with disc space narrowing. Bulge/osteophyte extends into the inferior aspect to neural foramina without significant nerve root compression. Facet joint degenerative changes and ligamentum flavum hypertrophy. Suggestion of prior left hemilaminectomy. Additionally, moderate size left paracentral disc protrusion with minimal cephalad and mild caudal extension compressing the left aspect of the thecal sac and the left S1 nerve root.  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 Additionally, prominent epidural fat further contributes to baseline thecal sac narrowing.  Prior left-sided L5-S1 laminectomy suspected. Moderate size left paracentral L5-S1 disc protrusion compressing thecal sac and left S1 nerve root as detailed above. Baseline L5-S1 prominent disc degeneration, bulge and osteophyte with mild bilateral foraminal narrowing.  L4-5 bulge slightly greater to the right. Facet joint degenerative changes.  L3-4 bulge with greatest extension right lateral position with  slight impression upon the exiting right L3 nerve root. Mild facet joint degenerative changes.   Electronically Signed   By: Chauncey Cruel M.D.   On: 09/27/2014 07:10   Mr Knee Left  Wo Contrast  09/27/2014   CLINICAL DATA:  Left knee pain.  EXAM: MRI OF THE LEFT KNEE WITHOUT CONTRAST  TECHNIQUE: Multiplanar, multisequence MR imaging of the knee was performed. No intravenous contrast was administered.  COMPARISON:  None.  FINDINGS: MENISCI  Medial meniscus:  Normal.  Lateral meniscus:  Normal.  LIGAMENTS  Cruciates:  Normal.  Collaterals:  Normal.  CARTILAGE  Patellofemoral:  Normal.  Medial:  Normal.  Lateral:  Joint:  Normal.  Popliteal Fossa:  Normal.  Extensor Mechanism:  Normal.  Bones:  Normal.  IMPRESSION: Normal MRI of the left knee.   Electronically Signed   By: Rozetta Nunnery M.D.   On: 09/27/2014 08:03    Assessment/Plan: Diagnosis: left leg/foot pain. Hx of CHRONIC left foot drop likely related to old lumbar disk/spine injury and involvement of the left L5 nerve root. (Had what appears to be left L5-S1 hemi laminectomy which would correspond to that injury level) 1. Does the need for close, 24 hr/day medical supervision in concert with the patient's rehab needs make it unreasonable for this patient to be served in a less intensive setting? No 2. Co-Morbidities requiring supervision/potential complications:   3. Due to bladder management, bowel management and safety, does the patient require 24 hr/day rehab nursing? No 4. Does the patient require coordinated care of a physician, rehab nurse, PT, OT to address physical and functional deficits in the context of the above medical diagnosis(es)? No Addressing deficits in the following  areas: balance, endurance, locomotion, strength, transferring, bowel/bladder control, bathing, dressing, feeding and grooming 5. Can the patient actively participate in an intensive therapy program of at least 3 hrs of therapy per day at least 5 days per week?  Potentially 6. The potential for patient to make measurable gains while on inpatient rehab is poor 7. Anticipated functional outcomes upon discharge from inpatient rehab are n/a  with PT, n/a with OT, n/a with SLP. 8. Estimated rehab length of stay to reach the above functional goals is:   9. Does the patient have adequate social supports and living environment to accommodate these discharge functional goals? N/A 10. Anticipated D/C setting: Other 11. Anticipated post D/C treatments: [place] therapy 12. Overall Rehab/Functional Prognosis: fair  RECOMMENDATIONS: This patient's condition is appropriate for continued rehabilitative care in the following setting: [facility] Therapy Patient has agreed to participate in recommended program. N/A Note that insurance prior authorization may be required for reimbursement for recommended care.  Comment: Pt with old left L5 injury. Recommend pain mgt and dc home with improved health hygiene and [place] services.   Meredith Staggers, MD, [place] Physical Medicine & Rehabilitation 09/28/2014     09/27/2014

## 2014-09-28 ENCOUNTER — Encounter (HOSPITAL_COMMUNITY): Payer: Self-pay | Admitting: Cardiology

## 2014-09-28 DIAGNOSIS — F1910 Other psychoactive substance abuse, uncomplicated: Secondary | ICD-10-CM

## 2014-09-28 DIAGNOSIS — I471 Supraventricular tachycardia: Secondary | ICD-10-CM

## 2014-09-28 DIAGNOSIS — M5417 Radiculopathy, lumbosacral region: Secondary | ICD-10-CM

## 2014-09-28 DIAGNOSIS — I10 Essential (primary) hypertension: Secondary | ICD-10-CM

## 2014-09-28 LAB — FERRITIN: Ferritin: 229 ng/mL (ref 22–322)

## 2014-09-28 LAB — CBC
HCT: 40.5 % (ref 39.0–52.0)
Hemoglobin: 13.7 g/dL (ref 13.0–17.0)
MCH: 27 pg (ref 26.0–34.0)
MCHC: 33.8 g/dL (ref 30.0–36.0)
MCV: 79.9 fL (ref 78.0–100.0)
Platelets: 265 10*3/uL (ref 150–400)
RBC: 5.07 MIL/uL (ref 4.22–5.81)
RDW: 14.6 % (ref 11.5–15.5)
WBC: 6.6 10*3/uL (ref 4.0–10.5)

## 2014-09-28 LAB — COMPREHENSIVE METABOLIC PANEL
ALT: 23 U/L (ref 0–53)
AST: 34 U/L (ref 0–37)
Albumin: 3.3 g/dL — ABNORMAL LOW (ref 3.5–5.2)
Alkaline Phosphatase: 65 U/L (ref 39–117)
Anion gap: 8 (ref 5–15)
BUN: 17 mg/dL (ref 6–23)
CO2: 22 mmol/L (ref 19–32)
Calcium: 9.4 mg/dL (ref 8.4–10.5)
Chloride: 112 mmol/L (ref 96–112)
Creatinine, Ser: 1.38 mg/dL — ABNORMAL HIGH (ref 0.50–1.35)
GFR calc Af Amer: 62 mL/min — ABNORMAL LOW (ref >90)
GFR calc non Af Amer: 53 mL/min — ABNORMAL LOW (ref >90)
Glucose, Bld: 99 mg/dL (ref 70–99)
Potassium: 3.6 mmol/L (ref 3.5–5.1)
Sodium: 142 mmol/L (ref 135–145)
Total Bilirubin: 0.6 mg/dL (ref 0.3–1.2)
Total Protein: 7.1 g/dL (ref 6.0–8.3)

## 2014-09-28 LAB — VITAMIN B12: Vitamin B-12: 347 pg/mL (ref 211–911)

## 2014-09-28 LAB — IRON AND TIBC
Iron: 103 ug/dL (ref 42–165)
Saturation Ratios: 32 % (ref 20–55)
TIBC: 323 ug/dL (ref 215–435)
UIBC: 220 ug/dL (ref 125–400)

## 2014-09-28 LAB — TSH: TSH: 2.73 u[IU]/mL (ref 0.350–4.500)

## 2014-09-28 LAB — RETICULOCYTES
RBC.: 5.07 MIL/uL (ref 4.22–5.81)
Retic Count, Absolute: 60.8 10*3/uL (ref 19.0–186.0)
Retic Ct Pct: 1.2 % (ref 0.4–3.1)

## 2014-09-28 LAB — FOLATE: Folate: 18.3 ng/mL

## 2014-09-28 LAB — MAGNESIUM: Magnesium: 2 mg/dL (ref 1.5–2.5)

## 2014-09-28 MED ORDER — POTASSIUM CHLORIDE CRYS ER 20 MEQ PO TBCR
40.0000 meq | EXTENDED_RELEASE_TABLET | Freq: Once | ORAL | Status: AC
  Administered 2014-09-28: 40 meq via ORAL
  Filled 2014-09-28: qty 2

## 2014-09-28 MED ORDER — ADULT MULTIVITAMIN W/MINERALS CH: 1.0000 | ORAL_TABLET | Freq: Every day | ORAL | Status: DC

## 2014-09-28 MED ORDER — ISOSORBIDE MONONITRATE ER 30 MG PO TB24
15.0000 mg | ORAL_TABLET | Freq: Every day | ORAL | Status: DC
Start: 2014-09-28 — End: 2014-09-29

## 2014-09-28 NOTE — Progress Notes (Signed)
Notified Schorr, NP that pt is having frequent 1 sec. pauses on telemetry. Pt is asymptomatic. No new orders given. Will continue to monitor pt. Ranelle Oyster, RN

## 2014-09-28 NOTE — Care Management Note (Signed)
    Page 1 of 2   09/28/2014     4:44:10 PM CARE MANAGEMENT NOTE 09/28/2014  Patient:  ION, WIESNER   Account Number:  [id number]  Date Initiated:  09/28/2014  Documentation initiated by:  [facility]  Subjective/Objective Assessment:   leg pain, foot drop  admit- lives with spouse.     Action/Plan:   pt eval- CIR, patient not a candidate for CIR.   Anticipated DC Date:  09/29/2014   Anticipated DC Plan:  [place]  In-house referral  Clinical Social Worker      DC Planning Services  CM consult      [facility] Choice  HOME HEALTH   Choice offered to / List presented to:  C-3 Spouse        HH arranged  HH-1 RN  [place].   Status of service:  Completed, signed off Medicare Important Message given?  YES (If response is "NO", the following Medicare IM given date fields will be blank) Date Medicare IM given:  09/28/2014 Medicare IM given by:  Tomi Bamberger Date Additional Medicare IM given:   Additional Medicare IM given by:    Discharge Disposition:    Per UR Regulation:  Reviewed for med. necessity/level of care/duration of stay  If discussed at [place] of Stay Meetings, dates discussed:    Comments:  09/28/14 [place], BSN [phone] patient 's wife chose [facility] for [facility], [place], and aide.  Referral made to [facility], [place] notified.  Soc will begin 24-48 hrs post dc.

## 2014-09-28 NOTE — Discharge Summary (Signed)
Physician Discharge Summary  [name] [id number] DOB: 1950-11-16 DOA: 09/26/2014  PCP: Philis Fendt, MD  Admit date: 09/26/2014 Discharge date: 09/29/2014  Time spent: 30 minutes  Recommendations for Outpatient Follow-up:  1. Call Dr. Elmarie Shiley office to arrange an appointment for 2-3 weeks after discharge 2. Follow up with your PCP within 7-10 days after discharge 3. Home Health PT and RN to follow after discharge  Discharge Diagnoses:  Active Problems:   Uncontrolled hypertension due to non adherance to meds-resolved   Polysubstance abuse- primarily ETOH, UDS negative for cocaine this admission   Peripheral vascular disease   Foot drop, left (chronic) s/p application of ankle/foot orhosis   Lumbosacral radiculopathy at L5   Paroxysmal supraventricular tachycardia   Discharge Condition: stable  Diet recommendation: Heart Healthy  Filed Weights   09/27/14 0656  Weight: 219 lb 2.2 oz (99.4 kg)    History of present illness:  64 year male patient with history of hypertension and dyslipidemia as well as ongoing alcohol abuse, CAD, heart failure and peripheral arterial disease. He presents with acute onset left leg pain and left foot weakness started about 24 hours prior to presentation. Began as a sharp pain in the left knee that radiates to the left foot was unable to move the left foot. Denied similar symptoms. CT of the head in the ER was unremarkable.  Since admission he has undergone MRI of the lumbar spine that did not demonstrate any acute changes to explain the patient's symptoms. MRI of the left knee was normal. It was suspected that the patient's foot drop is chronic in nature. Neurology had evaluated the patient and suspects left peroneal neuropathy which should gradually improve in 4-6 weeks in setting of normal MRI knee. Patient having significant difficulty ambulating so physical therapy has been consulted and recommended CIR evaluation.  After  admission the patient's wife was available to clarify history since the patient is an extremely poor historian. It was determined that the patient's foot drop was chronic in nature and given this he would not be a candidate for CIR admission. We are awaiting orthotic boot placement and are planning on pursuing home health PT.   On the a.m. of 1/26 it was documented that the patient had been having apparent one second pauses; in addition he had at least one run of SVT heart rates in the 130s. Cardiology was consultated. Patient's blood pressures have remained well controlled with current regimen noting isosorbide mononitrate was added this admission. Have also asked for home health RN after discharge.  Hospital Course:  Uncontrolled hypertension -Likely related to noncompliance with medication and possible early alcohol withdrawal -Have resumed home medications of lisinopril and twice a day hydralazine -Began low-dose Imdur for afterload reduction but SBP dropped into the 90s so was dc'd on date of discharge -pharmacist had recorded pt was not taking Lisinopril nor his Hydralazine before admission and had not had any alcohol for 2 days so likely presenting HTN due to ETOH withdrawal and medication noncompliance  Transient SVT/reported frequent 1 second pauses -noted with one episode of SVT rate 130s; evaluated by Cardiology who felt the rhythm was more likely ST with nonconducted PACs; no indication for beta blocker and suggested pt follow up with Dr. Acie Fredrickson after discharge -Patient was not on AV nodal blocking agents -Also RN had reported 1 second nocturnal pauses which were deemed inconsequential and we suspect could be related to sleep apnea?   Polysubstance abuse -Primarily alcohol -Urine drug screen  negative for cocaine this admission -Drinks at least a 12 pack of beer per day so we began CIWA protocol 1/25  -Anemia panel pending (to ensure folate and B12 levels are appropriate)    Peripheral vascular disease   Dyslipidemia -Continue Lipitor -CPK 227 down etiology to patient's lower extremity symptoms   Foot drop, left -Appreciate neurologist assistance -Initially it was suspected this represented a left peroneal neuropathy which should resolve or improve in 4-6 weeks-later after appropriate history obtained from the wife (patient is a poor historian) it was determined the foot drop was chronic -We'll have the tech apply ankle-foot appliance -Since the foot drop is chronic he is not a candidate for rehabilitation therapy but may benefit from home health PT after orthotic device applied-I will complete face to face in Epic  Procedures: 2-D echocardiogram: - Left ventricle: The cavity size was normal. Wall thickness was increased in a pattern of mild LVH. The estimated ejection fraction was 55%. Wall motion was normal; there were no regional wall motion abnormalities. Doppler parameters are consistent with abnormal left ventricular relaxation (grade 1 diastolic dysfunction). - Left atrium: The atrium was mildly dilated. - Right ventricle: The cavity size was normal. Systolic function was normal.  Consultations:  Neurology  Cardiology  Discharge Exam: Filed Vitals:   09/29/14 1036  BP: 118/70  Pulse:   Temp: 98.3 F (36.8 C)  Resp: 16   Gen: No acute respiratory distress Chest: Clear to auscultation bilaterally without wheezes, rhonchi or crackles, room air Cardiac: Regular rate and rhythm, S1-S2, no rubs murmurs or gallops, no peripheral edema, no JVD Abdomen: Soft nontender nondistended without obvious hepatosplenomegaly, no ascites Extremities: Symmetrical in appearance without cyanosis, clubbing or effusion; left foot drop   Discharge Instructions   Discharge Instructions    Call MD for:  difficulty breathing, headache or visual disturbances    Complete by:  As directed      Call MD for:  extreme fatigue    Complete by:  As  directed      Call MD for:  persistant dizziness or light-headedness    Complete by:  As directed      Call MD for:  temperature >100.4    Complete by:  As directed      Diet - low sodium heart healthy    Complete by:  As directed      Discharge instructions    Complete by:  As directed   [place]     Increase activity slowly    Complete by:  As directed      Walk with assistance    Complete by:  As directed      Walker     Complete by:  As directed   USE ORTHOTIC TO [place]          Current Discharge Medication List    START taking these medications   Details  Multiple Vitamin (MULTIVITAMIN WITH MINERALS) TABS tablet Take 1 tablet by mouth daily.      CONTINUE these medications which have CHANGED   Details  hydrALAZINE (APRESOLINE) 50 MG tablet Take 1 tablet (50 mg total) by mouth 2 (two) times daily. Qty: 60 tablet, Refills: 3    lisinopril (PRINIVIL,ZESTRIL) 10 MG tablet Take 2 tablets (20 mg total) by mouth daily. Qty: 60 tablet, Refills: 3      CONTINUE these medications which have NOT CHANGED   Details  traMADol (ULTRAM) 50 MG tablet Take 1  tablet (50 mg total) by mouth every 6 (six) hours as needed for pain. For pain Qty: 60 tablet, Refills: 3    aspirin EC 81 MG tablet Take 1 tablet (81 mg total) by mouth daily. Qty: 30 tablet, Refills: 3    atorvastatin (LIPITOR) 20 MG tablet Take 20 mg by mouth daily.       No Known Allergies Follow-up Information    Follow up with Nahser, Wonda Cheng, MD.   Specialty:  Cardiology   Why:  2-3 weeks regarding blood pressure and accasional fast heartbeat   Contact information:   [address] [place] [address] [phone]       Follow up with Philis Fendt, MD.   Specialty:  Internal Medicine   Why:  witnin 7-10 days after discharge   Contact information:   [address] [place] [address] [phone]       Follow up with [facility].   Why:  HHRN, PT,    Contact information:   [address] [place] [address] [phone]        The results of significant diagnostics from this hospitalization (including imaging, microbiology, ancillary and laboratory) are listed below for reference.    Significant Diagnostic Studies: Ct Head Wo Contrast  09/27/2014   CLINICAL DATA:  Acute onset of right leg pain. Not fully oriented. Initial encounter.  EXAM: CT HEAD WITHOUT CONTRAST  TECHNIQUE: Contiguous axial images were obtained from the base of the skull through the vertex without intravenous contrast.  COMPARISON:  CT of the head performed 06/29/2012, and MRI of the brain performed 06/30/2012  FINDINGS: There is no evidence of acute infarction, mass lesion, or intra- or extra-axial hemorrhage on CT.  Prominence of the ventricles and sulci reflects mild to moderate cortical volume loss. Cerebellar atrophy is noted. Diffuse periventricular and subcortical white matter change likely reflects small vessel ischemic microangiopathy. Chronic infarcts are seen at the left frontal and high left parietal lobes, with associated encephalomalacia. A chronic infarct is also noted in the right cerebellar hemisphere. Small chronic infarcts are seen within the basal ganglia bilaterally.  The brainstem and fourth ventricle are within normal limits. No mass effect or midline shift is seen.  There is no evidence of fracture; visualized osseous structures are unremarkable in appearance. The orbits are within normal limits. Mucosal thickening is noted at the right maxillary sinus. The remaining paranasal sinuses and mastoid air cells are well-aerated. No significant soft tissue abnormalities are seen.  IMPRESSION: 1. No acute intracranial pathology seen on CT. 2. Mild to moderate cortical volume loss and diffuse small vessel ischemic microangiopathy. 3. 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 4. Chronic infarct at the right cerebellar hemisphere. Small chronic infarcts within the basal ganglia bilaterally. 5. Mucosal thickening at the right maxillary sinus.   Electronically Signed   By: Garald Balding M.D.   On: 09/27/2014 02:26   Mr Lumbar Spine Wo Contrast  09/27/2014   CLINICAL DATA:  64 year old male with the left footdrop since yesterday. Denies trauma or back pain.  EXAM: MRI LUMBAR SPINE WITHOUT CONTRAST  TECHNIQUE: Multiplanar, multisequence MR imaging of the lumbar spine was performed. No intravenous contrast was administered.  COMPARISON:  None.  FINDINGS: Exam is slightly motion degraded.  Last fully open disk space is labeled L5-S1. Present examination incorporates from T11-12 disc space through the S2-S3 level.  Conus L1 level.  Atherosclerotic type changes  aorta and iliac arteries without aneurysmal dilation.  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 Additionally, prominent epidural fat further contributes to baseline thecal sac narrowing. Additionally:  T11-12 through L1-2 unremarkable.  L2-3:  Mild facet joint degenerative changes.  L3-4: Bulge with greatest extension right lateral position with slight impression upon the exiting right L3 nerve root. Mild facet joint degenerative changes.  L4-5: Baseline thecal sac narrowing secondary to short pedicles and prominent epidural fat with superimposed bulge and mild facet joint degenerative changes further causing slight narrowing of the thecal sac minimally more notable on the right.  L5-S1: Prominent disc degeneration with disc space narrowing. Bulge/osteophyte extends into the inferior aspect to neural foramina without significant nerve root compression. Facet joint degenerative changes and ligamentum flavum hypertrophy. Suggestion of prior left hemilaminectomy. Additionally, moderate size left paracentral disc protrusion with minimal cephalad and mild caudal extension compressing the left aspect  of the thecal sac and the left S1 nerve root.  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 Additionally, prominent epidural fat further contributes to baseline thecal sac narrowing.  Prior left-sided L5-S1 laminectomy suspected. Moderate size left paracentral L5-S1 disc protrusion compressing thecal sac and left S1 nerve root as detailed above. Baseline L5-S1 prominent disc degeneration, bulge and osteophyte with mild bilateral foraminal narrowing.  L4-5 bulge slightly greater to the right. Facet joint degenerative changes.  L3-4 bulge with greatest extension right lateral position with slight impression upon the exiting right L3 nerve root. Mild facet joint degenerative changes.   Electronically Signed   By: Chauncey Cruel M.D.   On: 09/27/2014 07:10   Mr Knee Left  Wo Contrast  09/27/2014   CLINICAL DATA:  Left knee pain.  EXAM: MRI OF THE LEFT KNEE WITHOUT CONTRAST  TECHNIQUE: Multiplanar, multisequence MR imaging of the knee was performed. No intravenous contrast was administered.  COMPARISON:  None.  FINDINGS: MENISCI  Medial meniscus:  Normal.  Lateral meniscus:  Normal.  LIGAMENTS  Cruciates:  Normal.  Collaterals:  Normal.  CARTILAGE  Patellofemoral:  Normal.  Medial:  Normal.  Lateral:  Joint:  Normal.  Popliteal Fossa:  Normal.  Extensor Mechanism:  Normal.  Bones:  Normal.  IMPRESSION: Normal MRI of the left knee.   Electronically Signed   By: Rozetta Nunnery M.D.   On: 09/27/2014 08:03    Microbiology: No results found for this or any previous visit (from the past 240 hour(s)).   Labs: Basic Metabolic Panel:  Recent Labs Lab 09/27/14 0002 09/27/14 0818 09/28/14 0747 09/29/14 0619  NA 140 141 142 138  K 3.8 3.4* 3.6 4.0  CL 111 108 112 109  CO2 26 20 22 26   GLUCOSE 110* 95 99 92  BUN 14 10 17 21   CREATININE 1.37* 1.16 1.38* 1.55*  CALCIUM 9.2 9.3 9.4 9.0  MG  --   --  2.0  --    Liver Function Tests:  Recent Labs Lab 09/27/14 0002 09/28/14 0747    AST 25 34  ALT 20 23  ALKPHOS 66 65  BILITOT 0.6 0.6  PROT 7.5 7.1  ALBUMIN 3.5 3.3*   No results for input(s): LIPASE, AMYLASE in the last 168 hours. No results for input(s): AMMONIA in the last 168 hours. CBC:  Recent Labs Lab 09/27/14 0002 09/27/14 0818 09/28/14 0747 09/29/14 0619  WBC 8.0 7.3 6.6 6.4  NEUTROABS 4.5  --   --   --   HGB 13.6 15.1 13.7 13.0  HCT 40.4 44.9  40.5 39.1  MCV 80.6 81.5 79.9 80.5  PLT 255 252 265 276   Cardiac Enzymes:  Recent Labs Lab 09/27/14 0002  CKTOTAL 220   BNP: BNP (last 3 results) No results for input(s): PROBNP in the last 8760 hours. CBG: No results for input(s): GLUCAP in the last 168 hours.     Signed:  Dmoni Fortson L. ANP  [facility] 09/29/2014, 10:46 AM

## 2014-09-28 NOTE — Progress Notes (Signed)
Inpatient Rehabilitation  I note that Dr. Naaman Plummer has completed the consult on [name] and is recommending Home Health services.  Pt. not a candidate at this time for IP Rehab. I discussed this with pt. and wife at the bedside.  Wife is requesting help in the home.  I notified Deborah, CM of pt's request.  I will sign off.  Thanks,  Gerlean Ren PT Inpatient Rehab Admissions Coordinator Cell [phone] Office [phone]

## 2014-09-28 NOTE — Progress Notes (Signed)
[name] APP Team - Progress Note  [name] [id number] DOB: [birth year] DOA: 09/26/2014 PCP: Philis Fendt, MD   Brief narrative: 64 year male patient with history of hypertension and dyslipidemia as well as ongoing alcohol abuse, CAD, heart failure and peripheral arterial disease. He presents with acute onset left leg pain and left foot weakness started about 24 hours prior to presentation. Began as a sharp pain in the left knee that radiates to the left foot was unable to move the left foot. Denied similar symptoms. CT of the head in the ER was unremarkable.  Since admission he has undergone MRI of the lumbar spine that did not demonstrate any acute changes to explain the patient's symptoms. MRI of the left knee was normal. It is suspected that the patient's foot drop is chronic in nature. Neurology had evaluated the patient and suspects left peroneal neuropathy which should gradually improve in 4-6 weeks in setting of normal MRI knee. Patient having significant difficulty ambulating so physical therapy has been consulted and recommended CIR evaluation.  After admission the patient's wife was available to clarify history since the patient is an extremely poor historian. It was determined that the patient's foot drop was chronic in nature and given this he would not be a candidate for CIR admission. Instead we are awaiting orthotic boot placement and are planning on pursuing home health PT. On the a.m. of 1/26 it was documented that the patient had been having apparent one second pauses; in addition he had at least one run of SVT heart rates in the 130s. Cardiology consultation has been placed. Patient may need cardiac monitor after discharge. Patient's blood pressures well controlled with current regimen noting isosorbide mononitrate was added this admission. Have also asked for home health RN after discharge.  HPI/Subjective: Up in chair. No specific complaints. Patient extremely poor  historian. Thankfully wife at bedside and clarifying history regarding the leg which has now been determined to be chronic in nature. Also patient with recent falls that may be mechanical in nature based on wife's description.  Assessment/Plan: Active Problems:   Uncontrolled hypertension -Likely related to noncompliance with medication and possible early alcohol withdrawal -Have resumed home medications of lisinopril and twice a day hydralazine -Began low-dose Imdur for afterload reduction -As of 1/26 blood pressure now well controlled  Transient SVT/reported frequent 1 second pauses -Review documentation from past 24 hours; night nurse noted patient having frequent 1 second pulses and telemetry-unfortunately the strips were not recorded and the alarm strips have been deleted from the monitor -1 of the strips that was documented actually revealed an SVT rates in the 130s -Cardiology consulted; unclear if patient needs continued inpatient monitoring versus have an outpatient monitor placed -Patient is not on AV nodal blocking agents -Possibility nocturnal sinus pauses could be related to sleep apnea?    Polysubstance abuse -Primarily alcohol -Urine drug screen negative for cocaine this admission -Drinks at least a 12 pack of beer per day so we began CIWA protocol 1/25 and tolerating well -Anemia panel pending (to ensure folate and B12 levels are appropriate)    Peripheral vascular disease    Dyslipidemia -Continue Lipitor -CPK 227 down etiology to patient's lower extremity symptoms    Foot drop, left -Appreciate neurologist assistance -Initially it was suspected this represented a left peroneal neuropathy which should resolve or improve in 4-6 weeks-later after appropriate history obtained from the wife (patient is a poor historian) it was determined the foot drop was chronic -  We'll have the tech apply ankle-foot appliance -Since the foot drop is chronic he is not a candidate for  rehabilitation therapy but may benefit from home health PT after orthotic device applied-I will complete face to face in Epic   DVT prophylaxis: Lovenox Code Status: Full Family Communication: Wife at bedside Disposition Plan/Expected LOS: Remain inpatient pending Cardiology evaluation and application of foot drop appliance   Consultants: Neurology  Procedures: 2-D echocardiogram: - Left ventricle: The cavity size was normal. Wall thickness was increased in a pattern of mild LVH. The estimated ejection fraction was 55%. Wall motion was normal; there were no regional wall motion abnormalities. Doppler parameters are consistent with abnormal left ventricular relaxation (grade 1 diastolic dysfunction). - Left atrium: The atrium was mildly dilated. - Right ventricle: The cavity size was normal. Systolic function was normal.  Cultures: None  Antibiotics: None  Objective: Blood pressure 109/61, pulse 85, temperature 98.5 F (36.9 C), temperature source Oral, resp. rate 20, height 5\' 11"  (1.803 m), weight 219 lb 2.2 oz (99.4 kg), SpO2 96 %.  Intake/Output Summary (Last 24 hours) at 09/28/14 1232 Last data filed at 09/27/14 1656  Gross per 24 hour  Intake      0 ml  Output    450 ml  Net   -450 ml     Exam: Gen: No acute respiratory distress Chest: Clear to auscultation bilaterally without wheezes, rhonchi or crackles, room air Cardiac: Regular rate and rhythm, S1-S2, no rubs murmurs or gallops, no peripheral edema, no JVD Abdomen: Soft nontender nondistended without obvious hepatosplenomegaly, no ascites Extremities: Symmetrical in appearance without cyanosis, clubbing or effusion  Scheduled Meds:  Scheduled Meds: . atorvastatin  20 mg Oral Daily  . enoxaparin (LOVENOX) injection  40 mg Subcutaneous Q24H  . folic acid  1 mg Oral Daily  . hydrALAZINE  50 mg Oral BID  . isosorbide mononitrate  15 mg Oral Daily  . lisinopril  20 mg Oral Daily  . multivitamin  with minerals  1 tablet Oral Daily  . pneumococcal 23 valent vaccine  0.5 mL Intramuscular Tomorrow-1000  . sodium chloride  3 mL Intravenous Q12H  . sodium chloride  3 mL Intravenous Q12H  . thiamine  100 mg Oral Daily   Or  . thiamine  100 mg Intravenous Daily   Continuous Infusions:   Data Reviewed: Basic Metabolic Panel:  Recent Labs Lab 09/27/14 0002 09/27/14 0818 09/28/14 0747  NA 140 141 142  K 3.8 3.4* 3.6  CL 111 108 112  CO2 26 20 22   GLUCOSE 110* 95 99  BUN 14 10 17   CREATININE 1.37* 1.16 1.38*  CALCIUM 9.2 9.3 9.4   Liver Function Tests:  Recent Labs Lab 09/27/14 0002 09/28/14 0747  AST 25 34  ALT 20 23  ALKPHOS 66 65  BILITOT 0.6 0.6  PROT 7.5 7.1  ALBUMIN 3.5 3.3*   No results for input(s): LIPASE, AMYLASE in the last 168 hours. No results for input(s): AMMONIA in the last 168 hours. CBC:  Recent Labs Lab 09/27/14 0002 09/27/14 0818 09/28/14 0747  WBC 8.0 7.3 6.6  NEUTROABS 4.5  --   --   HGB 13.6 15.1 13.7  HCT 40.4 44.9 40.5  MCV 80.6 81.5 79.9  PLT 255 252 265   Cardiac Enzymes:  Recent Labs Lab 09/27/14 0002  CKTOTAL 220   BNP (last 3 results) No results for input(s): PROBNP in the last 8760 hours. CBG: No results for input(s): GLUCAP in the last  168 hours.  No results found for this or any previous visit (from the past 240 hour(s)).   Studies:  Recent x-ray studies have been reviewed in detail by the Attending Physician  Time spent :      Erin Hearing, [place] [facility] Office  [phone] Pager [phone]   **If unable to reach the above provider after paging please contact the [place] @ [phone]  On-Call/Text Page:      [email or web address]      password [email or web address]  If 7PM-7AM, please contact night-coverage [email or web address] Password [email or web address] 09/28/2014, 12:32 PM   LOS: 2 days    I have directly reviewed the clinical findings, lab results and imaging studies. I have interviewed and examined the patient and  agree with the documentation and management as recorded by the Physician extender.  Tele strip record showed SVT and pause? Last night? K3.6, will check mag/tsh. Keep k>4, mag>2.  Patient asymptomatic, not sure if there is a pause, if there is no pause recorded, likely will benefit from coreg, h/o cocaine use per chart review. May need outpatient monitor.Cardiology consulted.   Likely able to d/c home tomorrow with orthosis to left ankle/foot and cardiology follow up.  Kody Vigil M.D on 09/28/2014 at 1:07 PM  [facility] Office  [phone]

## 2014-09-28 NOTE — Progress Notes (Signed)
Notified Schorr, NP that pt's BP is 99/58. NP gave order to hold 2200 of Apresoline tonight. Will continue to monitor pt. Ranelle Oyster, RN

## 2014-09-28 NOTE — Discharge Instructions (Signed)
Hypertension °Hypertension, commonly called high blood pressure, is when the force of blood pumping through your arteries is too strong. Your arteries are the blood vessels that carry blood from your heart throughout your body. A blood pressure reading consists of a higher number over a lower number, such as 110/72. The higher number (systolic) is the pressure inside your arteries when your heart pumps. The lower number (diastolic) is the pressure inside your arteries when your heart relaxes. Ideally you want your blood pressure below 120/80. °Hypertension forces your heart to work harder to pump blood. Your arteries may become narrow or stiff. Having hypertension puts you at risk for heart disease, stroke, and other problems.  °RISK FACTORS °Some risk factors for high blood pressure are controllable. Others are not.  °Risk factors you cannot control include:  °· Race. You may be at higher risk if you are African American. °· Age. Risk increases with age. °· Gender. Men are at higher risk than women before age 45 years. After age 65, women are at higher risk than men. °Risk factors you can control include: °· Not getting enough exercise or physical activity. °· Being overweight. °· Getting too much fat, sugar, calories, or salt in your diet. °· Drinking too much alcohol. °SIGNS AND SYMPTOMS °Hypertension does not usually cause signs or symptoms. Extremely high blood pressure (hypertensive crisis) may cause headache, anxiety, shortness of breath, and nosebleed. °DIAGNOSIS  °To check if you have hypertension, your health care provider will measure your blood pressure while you are seated, with your arm held at the level of your heart. It should be measured at least twice using the same arm. Certain conditions can cause a difference in blood pressure between your right and left arms. A blood pressure reading that is higher than normal on one occasion does not mean that you need treatment. If one blood pressure reading  is high, ask your health care provider about having it checked again. °TREATMENT  °Treating high blood pressure includes making lifestyle changes and possibly taking medicine. Living a healthy lifestyle can help lower high blood pressure. You may need to change some of your habits. °Lifestyle changes may include: °· Following the DASH diet. This diet is high in fruits, vegetables, and whole grains. It is low in salt, red meat, and added sugars. °· Getting at least 2½ hours of brisk physical activity every week. °· Losing weight if necessary. °· Not smoking. °· Limiting alcoholic beverages. °· Learning ways to reduce stress. ° If lifestyle changes are not enough to get your blood pressure under control, your health care provider may prescribe medicine. You may need to take more than one. Work closely with your health care provider to understand the risks and benefits. °HOME CARE INSTRUCTIONS °· Have your blood pressure rechecked as directed by your health care provider.   °· Take medicines only as directed by your health care provider. Follow the directions carefully. Blood pressure medicines must be taken as prescribed. The medicine does not work as well when you skip doses. Skipping doses also puts you at risk for problems.   °· Do not smoke.   °· Monitor your blood pressure at home as directed by your health care provider.  °SEEK MEDICAL CARE IF:  °· You think you are having a reaction to medicines taken. °· You have recurrent headaches or feel dizzy. °· You have swelling in your ankles. °· You have trouble with your vision. °SEEK IMMEDIATE MEDICAL CARE IF: °· You develop a severe headache or confusion. °·   You have unusual weakness, numbness, or feel faint.  You have severe chest or abdominal pain.  You vomit repeatedly.  You have trouble breathing. MAKE SURE YOU:   Understand these instructions.  Will watch your condition.  Will get help right away if you are not doing well or get worse. Document  Released: 08/20/2005 Document Revised: 01/04/2014 Document Reviewed: 06/12/2013 [facility] Patient Information 2015 [place], [place]. This information is not intended to replace advice given to you by your health care provider. Make sure you discuss any questions you have with your health care provider. Isosorbide Mononitrate extended-release tablets What is this medicine? ISOSORBIDE MONONITRATE (eye soe SOR bide mon oh NYE trate) is a vasodilator. It relaxes blood vessels, increasing the blood and oxygen supply to your heart. This medicine is used to prevent chest pain caused by angina. It will not help to stop an episode of chest pain. This medicine may be used for other purposes; ask your health care provider or pharmacist if you have questions. COMMON BRAND NAME(S): Imdur, Isotrate ER What should I tell my health care provider before I take this medicine? They need to know if you have any of these conditions: -previous heart attack or heart failure -an unusual or allergic reaction to isosorbide mononitrate, nitrates, other medicines, foods, dyes, or preservatives -pregnant or trying to get pregnant -breast-feeding How should I use this medicine? Take this medicine by mouth with a glass of water. Follow the directions on the prescription label. Do not crush or chew. Take your medicine at regular intervals. Do not take your medicine more often than directed. Do not stop taking this medicine except on the advice of your doctor or health care professional. Talk to your pediatrician regarding the use of this medicine in children. Special care may be needed. Overdosage: If you think you have taken too much of this medicine contact a poison control center or emergency room at once. NOTE: This medicine is only for you. Do not share this medicine with others. What if I miss a dose? If you miss a dose, take it as soon as you can. If it is almost time for your next dose, take only that dose. Do not take  double or extra doses. What may interact with this medicine? Do not take this medicine with any of the following medications: -medicines used to treat erectile dysfunction (ED) like avanafil, sildenafil, tadalafil, and vardenafil -riociguat This medicine may also interact with the following medications: -medicines for high blood pressure -other medicines for angina or heart failure This list may not describe all possible interactions. Give your health care provider a list of all the medicines, herbs, non-prescription drugs, or dietary supplements you use. Also tell them if you smoke, drink alcohol, or use illegal drugs. Some items may interact with your medicine. What should I watch for while using this medicine? Check your heart rate and blood pressure regularly while you are taking this medicine. Ask your doctor or health care professional what your heart rate and blood pressure should be and when you should contact him or her. Tell your doctor or health care professional if you feel your medicine is no longer working. You may get dizzy. Do not drive, use machinery, or do anything that needs mental alertness until you know how this medicine affects you. To reduce the risk of dizzy or fainting spells, do not sit or stand up quickly, especially if you are an older patient. Alcohol can make you more dizzy, and increase flushing  and rapid heartbeats. Avoid alcoholic drinks. Do not treat yourself for coughs, colds, or pain while you are taking this medicine without asking your doctor or health care professional for advice. Some ingredients may increase your blood pressure. What side effects may I notice from receiving this medicine? Side effects that you should report to your doctor or health care professional as soon as possible: -bluish discoloration of lips, fingernails, or palms of hands -irregular heartbeat, palpitations -low blood pressure -nausea, vomiting -persistent headache -unusually weak  or tired Side effects that usually do not require medical attention (report to your doctor or health care professional if they continue or are bothersome): -flushing of the face or neck -rash This list may not describe all possible side effects. Call your doctor for medical advice about side effects. You may report side effects to FDA at 1-800-FDA-1088. Where should I keep my medicine? Keep out of the reach of children. Store between 15 and 30 degrees C (59 and 86 degrees F). Keep container tightly closed. Throw away any unused medicine after the expiration date. NOTE: This sheet is a summary. It may not cover all possible information. If you have questions about this medicine, talk to your doctor, pharmacist, or health care provider.  2015, [employer]. (2013-06-19 14:48:19) Supraventricular Tachycardia Supraventricular tachycardia (SVT) is an abnormal heart rhythm (arrhythmia) that causes the heart to beat very fast (tachycardia). This kind of fast heartbeat originates in the upper chambers of the heart (atria). SVT can cause the heart to beat greater than 100 beats per minute. SVT can have a rapid burst of heartbeats. This can start and stop suddenly without warning and is called nonsustained. SVT can also be sustained, in which the heart beats at a continuous fast rate.  CAUSES  There can be different causes of SVT. Some of these include:  Heart valve problems such as mitral valve prolapse.  An enlarged heart (hypertrophic cardiomyopathy).  Congenital heart problems.  Heart inflammation (pericarditis).  Hyperthyroidism.  Low potassium or magnesium levels.  Caffeine.  Drug use such as cocaine, methamphetamines, or stimulants.  Some over-the-counter medicines such as:  Decongestants.  Diet medicines.  Herbal medicines. SYMPTOMS  Symptoms of SVT can vary. Symptoms depend on whether the SVT is sustained or nonsustained. You may experience:  No symptoms  (asymptomatic).  An awareness of your heart beating rapidly (palpitations).  Shortness of breath.  Chest pain or pressure. If your blood pressure drops because of the SVT, you may experience:  Fainting or near fainting.  Weakness.  Dizziness. DIAGNOSIS  Different tests can be performed to diagnose SVT, such as:  An electrocardiogram (EKG). This is a painless test that records the electrical activity of your heart.  Holter monitor. This is a 24 hour recording of your heart rhythm. You will be given a diary. Write down all symptoms that you have and what you were doing at the time you experienced symptoms.  Arrhythmia monitor. This is a small device that your wear for several weeks. It records the heart rhythm when you have symptoms.  Echocardiogram. This is an imaging test to help detect abnormal heart structure such as congenital abnormalities, heart valve problems, or heart enlargement.  Stress test. This test can help determine if the SVT is related to exercise.  Electrophysiology study (EPS). This is a procedure that evaluates your heart's electrical system and can help your caregiver find the cause of your SVT. TREATMENT  Treatment of SVT depends on the symptoms, how often it recurs,  and whether there are any underlying heart problems.   If symptoms are rare and no other cardiac disease is present, no treatment may be needed.  Blood work may be done to check potassium, magnesium, and thyroid hormone levels to see if they are abnormal. If these levels are abnormal, treatment to correct the problems will occur. Medicines Your caregiver may use oral medicines to treat SVT. These medicines are given for long-term control of SVT. Medicines may be used alone or in combination with other treatments. These medicines work to slow nerve impulses in the heart muscle. These medicines can also be used to treat high blood pressure. Some of these medicines may include:  Calcium channel  blockers.  Beta blockers.  Digoxin. Nonsurgical procedures Nonsurgical techniques may be used if oral medicines do not work. Some examples include:  Cardioversion. This technique uses either drugs or an electrical shock to restore a normal heart rhythm.  Cardioversion drugs may be given through an intravenous (IV) line to help "reset" the heart rhythm.  In electrical cardioversion, the caregiver shocks your heart to stop its beat for a split second. This helps to reset the heart to a normal rhythm.  Ablation. This procedure is done under mild sedation. High frequency radio wave energy is used to destroy the area of heart tissue responsible for the SVT. HOME CARE INSTRUCTIONS   Do not smoke.  Only take medicines prescribed by your caregiver. Check with your caregiver before using over-the-counter medicines.  Check with your caregiver about how much alcohol and caffeine (coffee, tea, colas, or chocolate) you may have.  It is very important to keep all follow-up referrals and appointments in order to properly manage this problem. SEEK IMMEDIATE MEDICAL CARE IF:  You have dizziness.  You faint or nearly faint.  You have shortness of breath.  You have chest pain or pressure.  You have sudden nausea or vomiting.  You have profuse sweating.  You are concerned about how long your symptoms last.  You are concerned about the frequency of your SVT episodes. If you have the above symptoms, call your local emergency services (911 in [place] immediately. Do not drive yourself to the hospital. MAKE SURE YOU:   Understand these instructions.  Will watch your condition.  Will get help right away if you are not doing well or get worse. Document Released: 08/20/2005 Document Revised: 11/12/2011 Document Reviewed: 12/02/2008 [facility] Patient Information 2015 [place], [place]. This information is not intended to replace advice given to you by your health care provider. Make sure you discuss  any questions you have with your health care provider.

## 2014-09-28 NOTE — Progress Notes (Signed)
   09/27/14 1300  PT Visit Information  Last PT Received On 09/27/14  Mee Hives, PT [facility] Acute Rehab Dept. Number: [id number]

## 2014-09-28 NOTE — Progress Notes (Signed)
Physical Therapy Treatment Patient Details Name: [name] MRN: [id number] DOB: 04-Dec-1950 Today's Date: 09/28/2014    History of Present Illness 64 yo male with stroke history was admitted with LLE pain and foot drop occurring suddenly with negative imaging of brain, negative for L side lumbar spine and  positive CT head for old stroke changes.  PMHx:  L hemilaminectomy lumbar spine, R side disc bulges, HTN, CHF, CAD, PAD, PVD, blind L eye    PT Comments    Pt was seen for further visit for strengthening to LLE and steps to chair with RW.  Pt is more stable in standing on LLE and demonstrates a slight mm return, although his weakness is compensated by a brace on LLE.  Even so, his PLOF was walking with his brace in the house without assistance on walker and at times family assistance was needed.  Requested CIR to continue intensive rehab.  Follow Up Recommendations  CIR     Equipment Recommendations  None recommended by PT    Recommendations for Other Services Rehab consult     Precautions / Restrictions Precautions Precautions: Fall Restrictions Weight Bearing Restrictions: No    Mobility  Bed Mobility Overal bed mobility: Needs Assistance Bed Mobility: Supine to Sit     Supine to sit: Min guard;Min assist     General bed mobility comments: uses bedrail to sit up bedside with PT assisting his L LE  Transfers Overall transfer level: Needs assistance Equipment used: Rolling walker (2 wheeled) Transfers: Sit to/from [facility] Sit to Stand: Min assist;Mod assist Stand pivot transfers: Min assist       General transfer comment: cues for technique and hand placement  Ambulation/Gait             General Gait Details: gait was sidestepping to chair   Stairs            Wheelchair Mobility    Modified Rankin (Stroke Patients Only)       Balance Overall balance assessment: Needs assistance Sitting-balance support: Feet  supported;Bilateral upper extremity supported Sitting balance-Leahy Scale: Fair   Postural control: Posterior lean Standing balance support: Bilateral upper extremity supported Standing balance-Leahy Scale: Poor Standing balance comment: LLE more secure to stand on it today                    Cognition Arousal/Alertness: Awake/alert Behavior During Therapy: WFL for tasks assessed/performed Overall Cognitive Status: Within Functional Limits for tasks assessed       Memory: Decreased short-term memory              Exercises General Exercises - Lower Extremity Ankle Circles/Pumps: AROM;AAROM;PROM;Left;20 reps Quad Sets: AROM;Left;10 reps Gluteal Sets: AROM;Left;5 reps Heel Slides: AROM;10 reps;Left Hip ABduction/ADduction: AROM;Left;10 reps    General Comments General comments (skin integrity, edema, etc.): sidesteps much more stable today and able to transition from walker in standing to chair.  Pt very tolerant of the activity by comparison      Pertinent Vitals/Pain Pain Assessment: No/denies pain Faces Pain Scale: No hurt  BP was 108/70, pulse 85 and O2 sat 95% per nursing notes.    Home Living                      Prior Function            PT Goals (current goals can now be found in the care plan section) Acute Rehab PT Goals Patient Stated Goal: back to  work  PT Goal Formulation: With patient Progress towards PT goals: Progressing toward goals    Frequency  Min 3X/week    PT Plan Current plan remains appropriate    Co-evaluation             End of Session   Activity Tolerance: Patient limited by fatigue Patient left: in chair;with call bell/phone within reach;with family/visitor present     Time: [id number] PT Time Calculation (min) (ACUTE ONLY): 29 min  Charges:  $Therapeutic Exercise: 8-22 mins $Therapeutic Activity: 8-22 mins                    G Codes:      Ramond Dial October 08, 2014, 2:10 PM  Mee Hives, PT  [facility] Acute Rehab Dept. Number: [id number]

## 2014-09-28 NOTE — Consult Note (Signed)
Primary cardiologist: Dr Acie Fredrickson  HPI: 64 year old male with past medical history of coronary artery disease status post coronary artery bypass and graft for evaluation of tachycardia. Patient is status post coronary artery bypass graft in 2010. Nuclear study in March 2011 showed an ejection fraction of 46%. There was a large lateral defect but no ischemia. Patient also has peripheral vascular disease followed by vascular surgery. He was admitted with left lower extremity pain. He apparently has had this intermittently for several years. He denies dyspnea, chest pain, palpitations or syncope. Noted to have tachycardia on telemetry and cardiology asked to evaluate.  Medications Prior to Admission  Medication Sig Dispense Refill  . traMADol (ULTRAM) 50 MG tablet Take 1 tablet (50 mg total) by mouth every 6 (six) hours as needed for pain. For pain 60 tablet 3  . aspirin EC 81 MG tablet Take 1 tablet (81 mg total) by mouth daily. (Patient not taking: Reported on 09/27/2014) 30 tablet 3  . atorvastatin (LIPITOR) 20 MG tablet Take 20 mg by mouth daily.    . hydrALAZINE (APRESOLINE) 50 MG tablet Take 1 tablet (50 mg total) by mouth 2 (two) times daily. (Patient not taking: Reported on 09/27/2014) 60 tablet 3  . lisinopril (PRINIVIL,ZESTRIL) 10 MG tablet Take 2 tablets (20 mg total) by mouth daily. (Patient not taking: Reported on 09/27/2014) 60 tablet 3    No Known Allergies   Past Medical History  Diagnosis Date  . Hypertension   . CHF (congestive heart failure)   . Coronary artery disease   . CVA (cerebral infarction)   . High cholesterol   . Peripheral vascular disease   . Myocardial infarction ~ 2011  . Stroke ~ 2009    "left eye; totally blind there"   . Blind left eye ~ 2009  . DVT (deep venous thrombosis)     Past Surgical History  Procedure Laterality Date  . Cardiac catheterization    . Coronary artery bypass graft  ?2011  . Peripheral arterial stent graft  2000's    BLE     History   Social History  . Marital Status: Married    Spouse Name: N/A    Number of Children: N/A  . Years of Education: N/A   Occupational History  . Not on file.   Social History Main Topics  . Smoking status: Current Every Day Smoker -- 0.12 packs/day for 48 years    Types: Cigarettes  . Smokeless tobacco: Never Used  . Alcohol Use: 1.8 oz/week    3 Cans of beer per week     Comment: 06/30/2012 "used to drink constantly; now maybe 1/2 beer qod"  . Drug Use: Yes    Special: "Crack" cocaine     Comment: 06/30/2012 "stopped ~ 1 yr ago"  . Sexual Activity: No   Other Topics Concern  . Not on file   Social History Narrative    Family History  Problem Relation Age of Onset  . Diabetes Sister   . Heart disease Sister   . Heart disease Brother     ROS:  LLE pain and blindness left eye but no fevers or chills, productive cough, hemoptysis, dysphasia, odynophagia, melena, hematochezia, dysuria, hematuria, rash, seizure activity, orthopnea, PND, pedal edema, claudication. Remaining systems are negative.  Physical Exam:   Blood pressure 108/70, pulse 85, temperature 97.9 F (36.6 C), temperature source Oral, resp. rate 20, height '5\' 11"'  (1.803 m), weight 219 lb 2.2 oz (99.4 kg), SpO2 95 %.  General:  Well developed/chronically ill appearing in NAD Skin warm/dry Patient not depressed No peripheral clubbing Back-normal HEENT-normal/normal eyelids Neck supple/normal carotid upstroke bilaterally; no bruits; no JVD; no thyromegaly chest - CTA/ normal expansion CV - RRR/normal S1 and S2; no murmurs, rubs or gallops;  PMI nondisplaced Abdomen -NT/ND, no HSM, no mass, + bowel sounds, no bruit 2+ femoral pulses, no bruits Ext-no edema, chords; diminished distal pulses Neuro-grossly nonfocal  ECG not performed  Results for orders placed or performed during the hospital encounter of 09/26/14 (from the past 48 hour(s))  CBC with Differential     Status: None    Collection Time: 09/27/14 12:02 AM  Result Value Ref Range   WBC 8.0 4.0 - 10.5 K/uL   RBC 5.01 4.22 - 5.81 MIL/uL   Hemoglobin 13.6 13.0 - 17.0 g/dL   HCT 40.4 39.0 - 52.0 %   MCV 80.6 78.0 - 100.0 fL   MCH 27.1 26.0 - 34.0 pg   MCHC 33.7 30.0 - 36.0 g/dL   RDW 14.7 11.5 - 15.5 %   Platelets 255 150 - 400 K/uL   Neutrophils Relative % 56 43 - 77 %   Neutro Abs 4.5 1.7 - 7.7 K/uL   Lymphocytes Relative 33 12 - 46 %   Lymphs Abs 2.7 0.7 - 4.0 K/uL   Monocytes Relative 9 3 - 12 %   Monocytes Absolute 0.7 0.1 - 1.0 K/uL   Eosinophils Relative 2 0 - 5 %   Eosinophils Absolute 0.1 0.0 - 0.7 K/uL   Basophils Relative 0 0 - 1 %   Basophils Absolute 0.0 0.0 - 0.1 K/uL  Comprehensive metabolic panel     Status: Abnormal   Collection Time: 09/27/14 12:02 AM  Result Value Ref Range   Sodium 140 135 - 145 mmol/L   Potassium 3.8 3.5 - 5.1 mmol/L   Chloride 111 96 - 112 mmol/L   CO2 26 19 - 32 mmol/L   Glucose, Bld 110 (H) 70 - 99 mg/dL   BUN 14 6 - 23 mg/dL   Creatinine, Ser 1.37 (H) 0.50 - 1.35 mg/dL   Calcium 9.2 8.4 - 10.5 mg/dL   Total Protein 7.5 6.0 - 8.3 g/dL   Albumin 3.5 3.5 - 5.2 g/dL   AST 25 0 - 37 U/L   ALT 20 0 - 53 U/L   Alkaline Phosphatase 66 39 - 117 U/L   Total Bilirubin 0.6 0.3 - 1.2 mg/dL   GFR calc non Af Amer 54 (L) >90 mL/min   GFR calc Af Amer 62 (L) >90 mL/min    Comment: (NOTE) The eGFR has been calculated using the CKD EPI equation. This calculation has not been validated in all clinical situations. eGFR's persistently <90 mL/min signify possible Chronic Kidney Disease.    Anion gap 3 (L) 5 - 15  CK     Status: None   Collection Time: 09/27/14 12:02 AM  Result Value Ref Range   Total CK 220 7 - 232 U/L  Basic metabolic panel     Status: Abnormal   Collection Time: 09/27/14  8:18 AM  Result Value Ref Range   Sodium 141 135 - 145 mmol/L   Potassium 3.4 (L) 3.5 - 5.1 mmol/L   Chloride 108 96 - 112 mmol/L   CO2 20 19 - 32 mmol/L   Glucose, Bld 95  70 - 99 mg/dL   BUN 10 6 - 23 mg/dL   Creatinine, Ser 1.16 0.50 - 1.35 mg/dL  Calcium 9.3 8.4 - 10.5 mg/dL   GFR calc non Af Amer 66 (L) >90 mL/min   GFR calc Af Amer 76 (L) >90 mL/min    Comment: (NOTE) The eGFR has been calculated using the CKD EPI equation. This calculation has not been validated in all clinical situations. eGFR's persistently <90 mL/min signify possible Chronic Kidney Disease.    Anion gap 13 5 - 15  CBC     Status: None   Collection Time: 09/27/14  8:18 AM  Result Value Ref Range   WBC 7.3 4.0 - 10.5 K/uL   RBC 5.51 4.22 - 5.81 MIL/uL   Hemoglobin 15.1 13.0 - 17.0 g/dL   HCT 44.9 39.0 - 52.0 %   MCV 81.5 78.0 - 100.0 fL   MCH 27.4 26.0 - 34.0 pg   MCHC 33.6 30.0 - 36.0 g/dL   RDW 14.7 11.5 - 15.5 %   Platelets 252 150 - 400 K/uL  Urine rapid drug screen (hosp performed)     Status: Abnormal   Collection Time: 09/27/14  8:19 AM  Result Value Ref Range   Opiates POSITIVE (A) NONE DETECTED   Cocaine NONE DETECTED NONE DETECTED   Benzodiazepines NONE DETECTED NONE DETECTED   Amphetamines NONE DETECTED NONE DETECTED   Tetrahydrocannabinol NONE DETECTED NONE DETECTED   Barbiturates NONE DETECTED NONE DETECTED    Comment:        DRUG SCREEN FOR MEDICAL PURPOSES ONLY.  IF CONFIRMATION IS NEEDED FOR ANY PURPOSE, NOTIFY LAB WITHIN 5 DAYS.        LOWEST DETECTABLE LIMITS FOR URINE DRUG SCREEN Drug Class       Cutoff (ng/mL) Amphetamine      1000 Barbiturate      200 Benzodiazepine   833 Tricyclics       825 Opiates          300 Cocaine          300 THC              50   Reticulocytes     Status: None   Collection Time: 09/28/14  7:47 AM  Result Value Ref Range   Retic Ct Pct 1.2 0.4 - 3.1 %   RBC. 5.07 4.22 - 5.81 MIL/uL   Retic Count, Manual 60.8 19.0 - 186.0 K/uL  CBC     Status: None   Collection Time: 09/28/14  7:47 AM  Result Value Ref Range   WBC 6.6 4.0 - 10.5 K/uL   RBC 5.07 4.22 - 5.81 MIL/uL   Hemoglobin 13.7 13.0 - 17.0 g/dL   HCT  40.5 39.0 - 52.0 %   MCV 79.9 78.0 - 100.0 fL   MCH 27.0 26.0 - 34.0 pg   MCHC 33.8 30.0 - 36.0 g/dL   RDW 14.6 11.5 - 15.5 %   Platelets 265 150 - 400 K/uL  Comprehensive metabolic panel     Status: Abnormal   Collection Time: 09/28/14  7:47 AM  Result Value Ref Range   Sodium 142 135 - 145 mmol/L   Potassium 3.6 3.5 - 5.1 mmol/L   Chloride 112 96 - 112 mmol/L   CO2 22 19 - 32 mmol/L   Glucose, Bld 99 70 - 99 mg/dL   BUN 17 6 - 23 mg/dL   Creatinine, Ser 1.38 (H) 0.50 - 1.35 mg/dL   Calcium 9.4 8.4 - 10.5 mg/dL   Total Protein 7.1 6.0 - 8.3 g/dL   Albumin 3.3 (L) 3.5 - 5.2 g/dL  AST 34 0 - 37 U/L   ALT 23 0 - 53 U/L   Alkaline Phosphatase 65 39 - 117 U/L   Total Bilirubin 0.6 0.3 - 1.2 mg/dL   GFR calc non Af Amer 53 (L) >90 mL/min   GFR calc Af Amer 62 (L) >90 mL/min    Comment: (NOTE) The eGFR has been calculated using the CKD EPI equation. This calculation has not been validated in all clinical situations. eGFR's persistently <90 mL/min signify possible Chronic Kidney Disease.    Anion gap 8 5 - 15    Ct Head Wo Contrast  09/27/2014   CLINICAL DATA:  Acute onset of right leg pain. Not fully oriented. Initial encounter.  EXAM: CT HEAD WITHOUT CONTRAST  TECHNIQUE: Contiguous axial images were obtained from the base of the skull through the vertex without intravenous contrast.  COMPARISON:  CT of the head performed 06/29/2012, and MRI of the brain performed 06/30/2012  FINDINGS: There is no evidence of acute infarction, mass lesion, or intra- or extra-axial hemorrhage on CT.  Prominence of the ventricles and sulci reflects mild to moderate cortical volume loss. Cerebellar atrophy is noted. Diffuse periventricular and subcortical white matter change likely reflects small vessel ischemic microangiopathy. Chronic infarcts are seen at the left frontal and high left parietal lobes, with associated encephalomalacia. A chronic infarct is also noted in the right cerebellar hemisphere.  Small chronic infarcts are seen within the basal ganglia bilaterally.  The brainstem and fourth ventricle are within normal limits. No mass effect or midline shift is seen.  There is no evidence of fracture; visualized osseous structures are unremarkable in appearance. The orbits are within normal limits. Mucosal thickening is noted at the right maxillary sinus. The remaining paranasal sinuses and mastoid air cells are well-aerated. No significant soft tissue abnormalities are seen.  IMPRESSION: 1. No acute intracranial pathology seen on CT. 2. Mild to moderate cortical volume loss and diffuse small vessel ischemic microangiopathy. 3. Chronic infarcts of the left frontal and left parietal lobes, with associated encephalomalacia. 4. Chronic infarct at the right cerebellar hemisphere. Small chronic infarcts within the basal ganglia bilaterally. 5. Mucosal thickening at the right maxillary sinus.   Electronically Signed   By: Garald Balding M.D.   On: 09/27/2014 02:26   Mr Lumbar Spine Wo Contrast  09/27/2014   CLINICAL DATA:  64 year old male with the left footdrop since yesterday. Denies trauma or back pain.  EXAM: MRI LUMBAR SPINE WITHOUT CONTRAST  TECHNIQUE: Multiplanar, multisequence MR imaging of the lumbar spine was performed. No intravenous contrast was administered.  COMPARISON:  None.  FINDINGS: Exam is slightly motion degraded.  Last fully open disk space is labeled L5-S1. Present examination incorporates from T11-12 disc space through the S2-S3 level.  Conus L1 level.  Atherosclerotic type changes aorta and iliac arteries without aneurysmal dilation.  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 Additionally, prominent epidural fat further contributes to baseline thecal sac narrowing. Additionally:  T11-12 through L1-2 unremarkable.  L2-3:  Mild facet joint degenerative changes.  L3-4: Bulge with greatest extension right lateral position with slight impression upon the exiting right L3  nerve root. Mild facet joint degenerative changes.  L4-5: Baseline thecal sac narrowing secondary to short pedicles and prominent epidural fat with superimposed bulge and mild facet joint degenerative changes further causing slight narrowing of the thecal sac minimally more notable on the right.  L5-S1: Prominent disc degeneration with disc space narrowing. Bulge/osteophyte extends into the inferior aspect to neural  foramina without significant nerve root compression. Facet joint degenerative changes and ligamentum flavum hypertrophy. Suggestion of prior left hemilaminectomy. Additionally, moderate size left paracentral disc protrusion with minimal cephalad and mild caudal extension compressing the left aspect of the thecal sac and the left S1 nerve root.  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 Additionally, prominent epidural fat further contributes to baseline thecal sac narrowing.  Prior left-sided L5-S1 laminectomy suspected. Moderate size left paracentral L5-S1 disc protrusion compressing thecal sac and left S1 nerve root as detailed above. Baseline L5-S1 prominent disc degeneration, bulge and osteophyte with mild bilateral foraminal narrowing.  L4-5 bulge slightly greater to the right. Facet joint degenerative changes.  L3-4 bulge with greatest extension right lateral position with slight impression upon the exiting right L3 nerve root. Mild facet joint degenerative changes.   Electronically Signed   By: Chauncey Cruel M.D.   On: 09/27/2014 07:10   Mr Knee Left  Wo Contrast  09/27/2014   CLINICAL DATA:  Left knee pain.  EXAM: MRI OF THE LEFT KNEE WITHOUT CONTRAST  TECHNIQUE: Multiplanar, multisequence MR imaging of the knee was performed. No intravenous contrast was administered.  COMPARISON:  None.  FINDINGS: MENISCI  Medial meniscus:  Normal.  Lateral meniscus:  Normal.  LIGAMENTS  Cruciates:  Normal.  Collaterals:  Normal.  CARTILAGE  Patellofemoral:  Normal.  Medial:   Normal.  Lateral:  Joint:  Normal.  Popliteal Fossa:  Normal.  Extensor Mechanism:  Normal.  Bones:  Normal.  IMPRESSION: Normal MRI of the left knee.   Electronically Signed   By: Rozetta Nunnery M.D.   On: 09/27/2014 08:03    Assessment/Plan 1 tachycardia-telemetry reviewed. There appears to be sinus to sinus tachycardia with occasional nonconducted PACs. There appears to be one brief episode of PAT. The patient is not symptomatic. No further workup indicated. LV function is normal on echo. I do not think beta blocker is indicated. 2 Left foot pain-management per primary care. 3 coronary artery disease-continue statin. Add aspirin. 4 hypertension-patient's blood pressure has improved following reinstitution of medications. Would continue lisinopril and hydralazine/nitrates. 5 tobacco abuse-patient counseled on discontinuing. Patient can be discharged from a cardiac standpoint and follow up with Dr. Acie Fredrickson. Please call with questions. [name] MD 09/28/2014, 1:19 PM

## 2014-09-29 LAB — CBC
HCT: 39.1 % (ref 39.0–52.0)
Hemoglobin: 13 g/dL (ref 13.0–17.0)
MCH: 26.7 pg (ref 26.0–34.0)
MCHC: 33.2 g/dL (ref 30.0–36.0)
MCV: 80.5 fL (ref 78.0–100.0)
Platelets: 276 10*3/uL (ref 150–400)
RBC: 4.86 MIL/uL (ref 4.22–5.81)
RDW: 14.7 % (ref 11.5–15.5)
WBC: 6.4 10*3/uL (ref 4.0–10.5)

## 2014-09-29 LAB — BASIC METABOLIC PANEL
Anion gap: 3 — ABNORMAL LOW (ref 5–15)
BUN: 21 mg/dL (ref 6–23)
CO2: 26 mmol/L (ref 19–32)
Calcium: 9 mg/dL (ref 8.4–10.5)
Chloride: 109 mmol/L (ref 96–112)
Creatinine, Ser: 1.55 mg/dL — ABNORMAL HIGH (ref 0.50–1.35)
GFR calc Af Amer: 54 mL/min — ABNORMAL LOW (ref >90)
GFR calc non Af Amer: 46 mL/min — ABNORMAL LOW (ref >90)
Glucose, Bld: 92 mg/dL (ref 70–99)
Potassium: 4 mmol/L (ref 3.5–5.1)
Sodium: 138 mmol/L (ref 135–145)

## 2014-09-29 MED ORDER — HYDRALAZINE HCL 50 MG PO TABS
50.0000 mg | ORAL_TABLET | Freq: Two times a day (BID) | ORAL | Status: DC
  Administered 2014-09-29: 50 mg via ORAL
  Filled 2014-09-29 (×2): qty 1

## 2014-09-29 MED ORDER — HYDRALAZINE HCL 25 MG PO TABS: 25.0000 mg | ORAL_TABLET | Freq: Two times a day (BID) | ORAL | Status: DC

## 2014-09-29 MED ORDER — HYDRALAZINE HCL 50 MG PO TABS: 50.0000 mg | ORAL_TABLET | Freq: Two times a day (BID) | ORAL | Status: DC

## 2014-09-29 MED ORDER — LISINOPRIL 10 MG PO TABS: 20.0000 mg | ORAL_TABLET | Freq: Every day | ORAL | Status: DC

## 2014-09-29 NOTE — Progress Notes (Signed)
Patient was discharged home by MD order; discharged instructions  review and give to patient and his wife with care notes and prescriptions; IV DIC; skin intact; patient will be escorted to the car by nurse tech via wheelchair.

## 2014-10-11 ENCOUNTER — Inpatient Hospital Stay (HOSPITAL_COMMUNITY): Admission: RE | Admit: 2014-10-11 | Payer: Medicare Other | Source: Ambulatory Visit

## 2014-10-21 ENCOUNTER — Encounter: Payer: Self-pay | Admitting: Family

## 2014-10-22 ENCOUNTER — Ambulatory Visit (HOSPITAL_COMMUNITY)
Admission: RE | Admit: 2014-10-22 | Discharge: 2014-10-22 | Disposition: A | Discharge status: Home / Self Care | Payer: Medicare Other | Source: Ambulatory Visit | Attending: Family | Admitting: Family

## 2014-10-22 ENCOUNTER — Ambulatory Visit (INDEPENDENT_AMBULATORY_CARE_PROVIDER_SITE_OTHER)
Admission: RE | Admit: 2014-10-22 | Discharge: 2014-10-22 | Disposition: A | Discharge status: Home / Self Care | Payer: Medicare Other | Source: Ambulatory Visit | Attending: Family | Admitting: Family

## 2014-10-22 ENCOUNTER — Encounter: Payer: Self-pay | Admitting: Family

## 2014-10-22 ENCOUNTER — Ambulatory Visit (INDEPENDENT_AMBULATORY_CARE_PROVIDER_SITE_OTHER): Payer: Medicare Other | Admitting: Family

## 2014-10-22 VITALS — BP 132/94 | HR 84 | Temp 97.3°F | Resp 18 | Ht 72.0 in | Wt 217.0 lb

## 2014-10-22 DIAGNOSIS — M7989 Other specified soft tissue disorders: Secondary | ICD-10-CM | POA: Diagnosis not present

## 2014-10-22 DIAGNOSIS — Z87891 Personal history of nicotine dependence: Secondary | ICD-10-CM | POA: Diagnosis not present

## 2014-10-22 DIAGNOSIS — Z9889 Other specified postprocedural states: Secondary | ICD-10-CM | POA: Insufficient documentation

## 2014-10-22 DIAGNOSIS — I70229 Atherosclerosis of native arteries of extremities with rest pain, unspecified extremity: Secondary | ICD-10-CM

## 2014-10-22 DIAGNOSIS — M79605 Pain in left leg: Secondary | ICD-10-CM | POA: Insufficient documentation

## 2014-10-22 DIAGNOSIS — R208 Other disturbances of skin sensation: Secondary | ICD-10-CM

## 2014-10-22 DIAGNOSIS — I998 Other disorder of circulatory system: Secondary | ICD-10-CM

## 2014-10-22 DIAGNOSIS — M79609 Pain in unspecified limb: Secondary | ICD-10-CM

## 2014-10-22 DIAGNOSIS — I6523 Occlusion and stenosis of bilateral carotid arteries: Secondary | ICD-10-CM

## 2014-10-22 DIAGNOSIS — I739 Peripheral vascular disease, unspecified: Secondary | ICD-10-CM | POA: Insufficient documentation

## 2014-10-22 DIAGNOSIS — M79672 Pain in left foot: Secondary | ICD-10-CM

## 2014-10-22 DIAGNOSIS — R209 Unspecified disturbances of skin sensation: Secondary | ICD-10-CM | POA: Insufficient documentation

## 2014-10-22 DIAGNOSIS — Z95828 Presence of other vascular implants and grafts: Secondary | ICD-10-CM

## 2014-10-22 MED ORDER — TRAMADOL HCL 50 MG PO TABS: 50.0000 mg | ORAL_TABLET | Freq: Four times a day (QID) | ORAL | Status: DC | PRN

## 2014-10-22 NOTE — Progress Notes (Signed)
[facility] [place] HISTORY AND PHYSICAL   MRN : [id number]  History of Present Illness:   [name] is a 64 y.o. male patient of Dr. Oneida Alar who is s/p left femoral tibial artery bypass graft on 09/14/2008.  He also has documented carotid artery stenosis from 2013 (review of records). He returns today with c/o intermittent left foot pain for 2-3 weeks, worse at night. It appears that he has not been seen as an outpatient for years, he has missed appointments. He was admitted at [facility] September 27 2014 for left foot pain; he was sent home with Tramadol for pain which he states provided adequate relief.  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 He has left foot drop since a back surgery.  His speech is mostly monosyllabic, but pt and wife deny that this is a result of a stroke; he follow commands. He is scheduled to see a dentist March 2016 for extraction of remaining carious teeth.   He walks with a walker at home, denies non healing wounds. He is wearing a diaper, wife indicates he has a little trouble with bladder incontinence, but mostly has control of his bowels.  06/29/12 carotid Duplex: Summary: The left internal carotid artery demonstrates elevated velocities in the upper 60-79% range of stenosis. The left external carotid artery also demonstrates elevated velocities, suggestive of stenosis. Vertebral arteries are patent with antegrade flow.  Pt Diabetic: No Pt smoker: smoker  (1 cigarette/day yrs), wife states his last cigarette was 2 days ago and he will not have any more  Pt meds include: Statin :Yes Betablocker: No ASA: Yes Other anticoagulants/antiplatelets: no   Current Outpatient Prescriptions  Medication Sig Dispense Refill  . aspirin EC 81 MG tablet Take 1 tablet (81 mg total) by mouth daily. (Patient not taking: Reported on 09/27/2014) 30 tablet 3  . atorvastatin (LIPITOR) 20 MG tablet Take 20 mg by mouth  daily.    . hydrALAZINE (APRESOLINE) 50 MG tablet Take 1 tablet (50 mg total) by mouth 2 (two) times daily. 60 tablet 3  . lisinopril (PRINIVIL,ZESTRIL) 10 MG tablet Take 2 tablets (20 mg total) by mouth daily. 60 tablet 3  . Multiple Vitamin (MULTIVITAMIN WITH MINERALS) TABS tablet Take 1 tablet by mouth daily.    . traMADol (ULTRAM) 50 MG tablet Take 1 tablet (50 mg total) by mouth every 6 (six) hours as needed for pain. For pain 60 tablet 3   No current facility-administered medications for this visit.    Past Medical History  Diagnosis Date  . Hypertension   . CHF (congestive heart failure)   . Coronary artery disease   . CVA (cerebral infarction)   . High cholesterol   . Peripheral vascular disease   . Myocardial infarction ~ 2011  . Stroke ~ 2009    "left eye; totally blind there"   . Blind left eye ~ 2009  . DVT (deep venous thrombosis)     Social History History  Substance Use Topics  . Smoking status: Current Every Day Smoker -- 0.12 packs/day for 48 years    Types: Cigarettes  . Smokeless tobacco: Never Used  . Alcohol Use: 1.8 oz/week    3 Cans of beer per week     Comment: 06/30/2012 "used to drink constantly; now maybe 1/2 beer qod"    Family History Family History  Problem Relation Age of Onset  . Diabetes Sister   .  Heart disease Sister   . Heart disease Brother     Surgical History Past Surgical History  Procedure Laterality Date  . Cardiac catheterization    . Coronary artery bypass graft  ?2011  . Peripheral arterial stent graft  2000's    BLE    No Known Allergies  Current Outpatient Prescriptions  Medication Sig Dispense Refill  . aspirin EC 81 MG tablet Take 1 tablet (81 mg total) by mouth daily. (Patient not taking: Reported on 09/27/2014) 30 tablet 3  . atorvastatin (LIPITOR) 20 MG tablet Take 20 mg by mouth daily.    . hydrALAZINE (APRESOLINE) 50 MG tablet Take 1 tablet (50 mg total) by mouth 2 (two) times daily. 60 tablet 3  .  lisinopril (PRINIVIL,ZESTRIL) 10 MG tablet Take 2 tablets (20 mg total) by mouth daily. 60 tablet 3  . Multiple Vitamin (MULTIVITAMIN WITH MINERALS) TABS tablet Take 1 tablet by mouth daily.    . traMADol (ULTRAM) 50 MG tablet Take 1 tablet (50 mg total) by mouth every 6 (six) hours as needed for pain. For pain 60 tablet 3   No current facility-administered medications for this visit.     REVIEW OF SYSTEMS: See HPI for pertinent positives and negatives.  Physical Examination Filed Vitals:   10/22/14 1545  BP: 132/94  Pulse: 84  Temp: 97.3 F (36.3 C)  TempSrc: Oral  Resp: 18  Height: 6' (1.829 m)  Weight: 217 lb (98.431 kg)  SpO2: 97%   Body mass index is 29.42 kg/(m^2).  General:  WDWN in NAD Gait: using wheelchair, left foot drop HENT: few remaining teeth are in advanced stages of decay. Eyes: Pupils equal Pulmonary: normal non-labored breathing but little air movement in all fields , without Rales, rhonchi,  wheezing Cardiac: RRR, no murmur detected  Abdomen: soft, NT, no masses palpated Skin: no rashes, ulcers noted;  no Gangrene , no cellulitis; no open wounds.   VASCULAR EXAM  Carotid Bruits Right Left   Positive Positive      Aorta is not palpable Radial pulses are 2+ palpable                        VASCULAR EXAM: Extremities without ischemic changes,  without Gangrene; without open wounds.                                                                                                           LE Pulses Right Left       FEMORAL   palpable   palpable        POPLITEAL  not palpable   not palpable       POSTERIOR TIBIAL  not palpable   not palpable        DORSALIS PEDIS      ANTERIOR TIBIAL not palpable  not palpable     Musculoskeletal: no muscle wasting or atrophy; no peripheral edema. Weak dorsiflexion of left foot.   Neurologic: A&O X 3; monosyllabic responses to questions,  MOTOR FUNCTION: 5/5 Symmetric, CN  2-12 intact     Non-Invasive  Vascular Imaging (10/22/2014):  LOWER EXTREMITY ARTERIAL DUPLEX EVALUATION    INDICATION: Follow up bypass graft     PREVIOUS INTERVENTION(S): Left femoral to mid anterior tibial artery bypass graft on 09/14/08    DUPLEX EXAM:     RIGHT  LEFT   Peak Systolic Velocity (cm/s) Ratio (if abnormal) Waveform  Peak Systolic Velocity (cm/s) Ratio (if abnormal) Waveform     Inflow Artery 112  M     Proximal Anastomosis 70  B     Proximal Graft 59  M     Mid Graft 39  M      Distal Graft 44  M     Distal Anastomosis 24  M     Outflow Artery 66/225 3.4 M  0.32 Today's ABI / TBI 0.65  0.64 Previous ABI / TBI (04/13/09 ) 0.81    Waveform:    M - Monophasic       B - Biphasic       T - Triphasic  If Ankle Brachial Index (ABI) or Toe Brachial Index (TBI) performed, please see complete report     ADDITIONAL FINDINGS: . No internal vessel narrowing noted within the bypass graft or anastomosis. . Elevated velocity noted at the left mid anterior tibial artery which appears to be due to a change in vessel diameter and vessel tortuosity. . No flow was adequately detected throughout the right superficial femoral or popliteal arteries with monophasic flow noted at the right common femoral artery.    IMPRESSION: 1. Patent left leg bypass graft with no evidence of internal stenosis noted. 2. Elevated velocity noted in the left anterior tibial artery, as described above. 3. Evidence of possible right superficial femoral and popliteal artery occlusions noted, as described above.       Compared to the previous exam:  No significant change noted in the left leg bypass graft when compared to the exam on 04/13/09.       ASSESSMENT:  [name] is a 64 y.o. male who is s/p left femoral tibial artery bypass graft on 09/14/2008. He also has documented carotid artery stenosis from 2013 (review of records). He has no tissue loss in his lower extremities. He was admitted to [facility] for left foot/leg pain on  09/27/14. He walks with a walker at times, also gets around in a w/c, left foot drop, reportedly since a back surgery. Noted that he has a history of ETOH and cocaine abuse according to ED records.   Today's left LE arterial Duplex reveals a patent left leg bypass graft with no evidence of internal stenosis. Elevated velocity noted in the left anterior tibial artery, as described above. Evidence of possible right superficial femoral and popliteal artery occlusions noted, as described above.  ABI's: Right: indicates critical limb ischemia with no detected Doppler signals in peroneal and posterior tibial, monophasic anterior tibial artery.           Left: indicates moderately decreased arterial perfusion with biphasic waveforms. The critical limb ischemia is in the right leg, but his pain is in the left ankle/foot.  He has a history of a CVA documented as about 2009,  with vision change in his right eye as the only manifestation per pt and wife. 2013 carotid Duplex documents left ICA stenosis of 60-79%.  His wife states he smoked his last cigarette 2 days ago. The patient was counseled re smoking cessation and given several free resources re smoking cessation.  Face to face time with patient was 30 minutes. Over 50% of this time was spent on counseling and coordination of care.    PLAN:   Pt requested medication for pain in left ankle, states Tramadol that he took previously for pain was effective; reordered 50 mg, 1 tablet every 6 hours prn pain, disp #60, 0 refills.  Based on today's exam and non-invasive vascular lab results, and after discussing with Dr. Bridgett Larsson, the patient will follow up in 1-2 weeks with the following tests: carotid Duplex and see Dr. Oneida Alar to discuss vascular options for right LE critical limb ischemia and left foot pain. I discussed in depth with the patient the nature of atherosclerosis, and emphasized the importance of maximal medical management including strict  control of blood pressure, blood glucose, and lipid levels, obtaining regular exercise, and cessation of smoking.  The patient is aware that without maximal medical management the underlying atherosclerotic disease process will progress, limiting the benefit of any interventions.  The patient was given information about stroke prevention and what symptoms should prompt the patient to seek immediate medical care.  The patient was given information about PAD including signs, symptoms, treatment, what symptoms should prompt the patient to seek immediate medical care, and risk reduction measures to take. Thank you for allowing [place] to participate in this patient's care.  Clemon Chambers, RN, MSN, FNP-C [facility] Office: [phone]  Clinic MD: Bridgett Larsson 10/22/2014 2:41 PM

## 2014-10-22 NOTE — Patient Instructions (Signed)
Stroke Prevention Some medical conditions and behaviors are associated with an increased chance of having a stroke. You may prevent a stroke by making healthy choices and managing medical conditions. HOW CAN I REDUCE MY RISK OF HAVING A STROKE?   Stay physically active. Get at least 30 minutes of activity on most or all days.  Do not smoke. It may also be helpful to avoid exposure to secondhand smoke.  Limit alcohol use. Moderate alcohol use is considered to be:  No more than 2 drinks per day for men.  No more than 1 drink per day for nonpregnant women.  Eat healthy foods. This involves:  Eating 5 or more servings of fruits and vegetables a day.  Making dietary changes that address high blood pressure (hypertension), high cholesterol, diabetes, or obesity.  Manage your cholesterol levels.  Making food choices that are high in fiber and low in saturated fat, trans fat, and cholesterol may control cholesterol levels.  Take any prescribed medicines to control cholesterol as directed by your health care provider.  Manage your diabetes.  Controlling your carbohydrate and sugar intake is recommended to manage diabetes.  Take any prescribed medicines to control diabetes as directed by your health care provider.  Control your hypertension.  Making food choices that are low in salt (sodium), saturated fat, trans fat, and cholesterol is recommended to manage hypertension.  Take any prescribed medicines to control hypertension as directed by your health care provider.  Maintain a healthy weight.  Reducing calorie intake and making food choices that are low in sodium, saturated fat, trans fat, and cholesterol are recommended to manage weight.  Stop drug abuse.  Avoid taking birth control pills.  Talk to your health care provider about the risks of taking birth control pills if you are over 35 years old, smoke, get migraines, or have ever had a blood clot.  Get evaluated for sleep  disorders (sleep apnea).  Talk to your health care provider about getting a sleep evaluation if you snore a lot or have excessive sleepiness.  Take medicines only as directed by your health care provider.  For some people, aspirin or blood thinners (anticoagulants) are helpful in reducing the risk of forming abnormal blood clots that can lead to stroke. If you have the irregular heart rhythm of atrial fibrillation, you should be on a blood thinner unless there is a good reason you cannot take them.  Understand all your medicine instructions.  Make sure that other conditions (such as anemia or atherosclerosis) are addressed. SEEK IMMEDIATE MEDICAL CARE IF:   You have sudden weakness or numbness of the face, arm, or leg, especially on one side of the body.  Your face or eyelid droops to one side.  You have sudden confusion.  You have trouble speaking (aphasia) or understanding.  You have sudden trouble seeing in one or both eyes.  You have sudden trouble walking.  You have dizziness.  You have a loss of balance or coordination.  You have a sudden, severe headache with no known cause.  You have new chest pain or an irregular heartbeat. Any of these symptoms may represent a serious problem that is an emergency. Do not wait to see if the symptoms will go away. Get medical help at once. Call your local emergency services (911 in [place] Do not drive yourself to the hospital. Document Released: 09/27/2004 Document Revised: 01/04/2014 Document Reviewed: 02/20/2013 [employer] Patient Information 2015 [employer]. This information is not intended to replace advice given   to you by your health care provider. Make sure you discuss any questions you have with your health care provider.     Peripheral Vascular Disease Peripheral Vascular Disease (PVD), also called Peripheral Arterial Disease (PAD), is a circulation problem caused by cholesterol (atherosclerotic plaque) deposits in the  arteries. PVD commonly occurs in the lower extremities (legs) but it can occur in other areas of the body, such as your arms. The cholesterol buildup in the arteries reduces blood flow which can cause pain and other serious problems. The presence of PVD can place a person at risk for Coronary Artery Disease (CAD).  CAUSES  Causes of PVD can be many. It is usually associated with more than one risk factor such as:   High Cholesterol.  Smoking.  Diabetes.  Lack of exercise or inactivity.  High blood pressure (hypertension).  Obesity.  Family history. SYMPTOMS   When the lower extremities are affected, patients with PVD may experience:  Leg pain with exertion or physical activity. This is called INTERMITTENT CLAUDICATION. This may present as cramping or numbness with physical activity. The location of the pain is associated with the level of blockage. For example, blockage at the abdominal level (distal abdominal aorta) may result in buttock or hip pain. Lower leg arterial blockage may result in calf pain.  As PVD becomes more severe, pain can develop with less physical activity.  In people with severe PVD, leg pain may occur at rest.  Other PVD signs and symptoms:  Leg numbness or weakness.  Coldness in the affected leg or foot, especially when compared to the other leg.  A change in leg color.  Patients with significant PVD are more prone to ulcers or sores on toes, feet or legs. These may take longer to heal or may reoccur. The ulcers or sores can become infected.  If signs and symptoms of PVD are ignored, gangrene may occur. This can result in the loss of toes or loss of an entire limb.  Not all leg pain is related to PVD. Other medical conditions can cause leg pain such as:  Blood clots (embolism) or Deep Vein Thrombosis.  Inflammation of the blood vessels (vasculitis).  Spinal stenosis. DIAGNOSIS  Diagnosis of PVD can involve several different types of tests. These  can include:  Pulse Volume Recording Method (PVR). This test is simple, painless and does not involve the use of X-rays. PVR involves measuring and comparing the blood pressure in the arms and legs. An ABI (Ankle-Brachial Index) is calculated. The normal ratio of blood pressures is 1. As this number becomes smaller, it indicates more severe disease.  < 0.95 - indicates significant narrowing in one or more leg vessels.  <0.8 - there will usually be pain in the foot, leg or buttock with exercise.  <0.4 - will usually have pain in the legs at rest.  <0.25 - usually indicates limb threatening PVD.  Doppler detection of pulses in the legs. This test is painless and checks to see if you have a pulses in your legs/feet.  A dye or contrast material (a substance that highlights the blood vessels so they show up on x-ray) may be given to help your caregiver better see the arteries for the following tests. The dye is eliminated from your body by the kidney's. Your caregiver may order blood work to check your kidney function and other laboratory values before the following tests are performed:  Magnetic Resonance Angiography (MRA). An MRA is a picture study of the   blood vessels and arteries. The MRA machine uses a large magnet to produce images of the blood vessels.  Computed Tomography Angiography (CTA). A CTA is a specialized x-ray that looks at how the blood flows in your blood vessels. An IV may be inserted into your arm so contrast dye can be injected.  Angiogram. Is a procedure that uses x-rays to look at your blood vessels. This procedure is minimally invasive, meaning a small incision (cut) is made in your groin. A small tube (catheter) is then inserted into the artery of your groin. The catheter is guided to the blood vessel or artery your caregiver wants to examine. Contrast dye is injected into the catheter. X-rays are then taken of the blood vessel or artery. After the images are obtained, the  catheter is taken out. TREATMENT  Treatment of PVD involves many interventions which may include:  Lifestyle changes:  Quitting smoking.  Exercise.  Following a low fat, low cholesterol diet.  Control of diabetes.  Foot care is very important to the PVD patient. Good foot care can help prevent infection.  Medication:  Cholesterol-lowering medicine.  Blood pressure medicine.  Anti-platelet drugs.  Certain medicines may reduce symptoms of Intermittent Claudication.  Interventional/Surgical options:  Angioplasty. An Angioplasty is a procedure that inflates a balloon in the blocked artery. This opens the blocked artery to improve blood flow.  Stent Implant. A wire mesh tube (stent) is placed in the artery. The stent expands and stays in place, allowing the artery to remain open.  Peripheral Bypass Surgery. This is a surgical procedure that reroutes the blood around a blocked artery to help improve blood flow. This type of procedure may be performed if Angioplasty or stent implants are not an option. SEEK IMMEDIATE MEDICAL CARE IF:   You develop pain or numbness in your arms or legs.  Your arm or leg turns cold, becomes blue in color.  You develop redness, warmth, swelling and pain in your arms or legs. MAKE SURE YOU:   Understand these instructions.  Will watch your condition.  Will get help right away if you are not doing well or get worse. Document Released: 09/27/2004 Document Revised: 11/12/2011 Document Reviewed: 08/24/2008 Exit[employer] Patient Information 2015 [employer]. This information is not intended to replace advice given to you by your health care provider. Make sure you discuss any questions you have with your health care provider.    Smoking Cessation Quitting smoking is important to your health and has many advantages. However, it is not always easy to quit since nicotine is a very addictive drug. Oftentimes, people try 3 times or more before being  able to quit. This document explains the best ways for you to prepare to quit smoking. Quitting takes hard work and a lot of effort, but you can do it. ADVANTAGES OF QUITTING SMOKING  You will live longer, feel better, and live better.  Your body will feel the impact of quitting smoking almost immediately.  Within 20 minutes, blood pressure decreases. Your pulse returns to its normal level.  After 8 hours, carbon monoxide levels in the blood return to normal. Your oxygen level increases.  After 24 hours, the chance of having a heart attack starts to decrease. Your breath, hair, and body stop smelling like smoke.  After 48 hours, damaged nerve endings begin to recover. Your sense of taste and smell improve.  After 72 hours, the body is virtually free of nicotine. Your bronchial tubes relax and breathing becomes easier.    After 2 to 12 weeks, lungs can hold more air. Exercise becomes easier and circulation improves.  The risk of having a heart attack, stroke, cancer, or lung disease is greatly reduced.  After 1 year, the risk of coronary heart disease is cut in half.  After 5 years, the risk of stroke falls to the same as a nonsmoker.  After 10 years, the risk of lung cancer is cut in half and the risk of other cancers decreases significantly.  After 15 years, the risk of coronary heart disease drops, usually to the level of a nonsmoker.  If you are pregnant, quitting smoking will improve your chances of having a healthy baby.  The people you live with, especially any children, will be healthier.  You will have extra money to spend on things other than cigarettes. QUESTIONS TO THINK ABOUT BEFORE ATTEMPTING TO QUIT You may want to talk about your answers with your health care provider.  Why do you want to quit?  If you tried to quit in the past, what helped and what did not?  What will be the most difficult situations for you after you quit? How will you plan to handle  them?  Who can help you through the tough times? Your family? Friends? A health care provider?  What pleasures do you get from smoking? What ways can you still get pleasure if you quit? Here are some questions to ask your health care provider:  How can you help me to be successful at quitting?  What medicine do you think would be best for me and how should I take it?  What should I do if I need more help?  What is smoking withdrawal like? How can I get information on withdrawal? GET READY  Set a quit date.  Change your environment by getting rid of all cigarettes, ashtrays, matches, and lighters in your home, car, or work. Do not let people smoke in your home.  Review your past attempts to quit. Think about what worked and what did not. GET SUPPORT AND ENCOURAGEMENT You have a better chance of being successful if you have help. You can get support in many ways.  Tell your family, friends, and coworkers that you are going to quit and need their support. Ask them not to smoke around you.  Get individual, group, or telephone counseling and support. Programs are available at local hospitals and health centers. Call your local health department for information about programs in your area.  Spiritual beliefs and practices may help some smokers quit.  Download a "quit meter" on your computer to keep track of quit statistics, such as how long you have gone without smoking, cigarettes not smoked, and money saved.  Get a self-help book about quitting smoking and staying off tobacco. LEARN NEW SKILLS AND BEHAVIORS  Distract yourself from urges to smoke. Talk to someone, go for a walk, or occupy your time with a task.  Change your normal routine. Take a different route to work. Drink tea instead of coffee. Eat breakfast in a different place.  Reduce your stress. Take a hot bath, exercise, or read a book.  Plan something enjoyable to do every day. Reward yourself for not  smoking.  Explore interactive web-based programs that specialize in helping you quit. GET MEDICINE AND USE IT CORRECTLY Medicines can help you stop smoking and decrease the urge to smoke. Combining medicine with the above behavioral methods and support can greatly increase your chances of successfully quitting smoking.    Nicotine replacement therapy helps deliver nicotine to your body without the negative effects and risks of smoking. Nicotine replacement therapy includes nicotine gum, lozenges, inhalers, nasal sprays, and skin patches. Some may be available over-the-counter and others require a prescription.  Antidepressant medicine helps people abstain from smoking, but how this works is unknown. This medicine is available by prescription.  Nicotinic receptor partial agonist medicine simulates the effect of nicotine in your brain. This medicine is available by prescription. Ask your health care provider for advice about which medicines to use and how to use them based on your health history. Your health care provider will tell you what side effects to look out for if you choose to be on a medicine or therapy. Carefully read the information on the package. Do not use any other product containing nicotine while using a nicotine replacement product.  RELAPSE OR DIFFICULT SITUATIONS Most relapses occur within the first 3 months after quitting. Do not be discouraged if you start smoking again. Remember, most people try several times before finally quitting. You may have symptoms of withdrawal because your body is used to nicotine. You may crave cigarettes, be irritable, feel very hungry, cough often, get headaches, or have difficulty concentrating. The withdrawal symptoms are only temporary. They are strongest when you first quit, but they will go away within 10-14 days. To reduce the chances of relapse, try to:  Avoid drinking alcohol. Drinking lowers your chances of successfully quitting.  Reduce the  amount of caffeine you consume. Once you quit smoking, the amount of caffeine in your body increases and can give you symptoms, such as a rapid heartbeat, sweating, and anxiety.  Avoid smokers because they can make you want to smoke.  Do not let weight gain distract you. Many smokers will gain weight when they quit, usually less than 10 pounds. Eat a healthy diet and stay active. You can always lose the weight gained after you quit.  Find ways to improve your mood other than smoking. FOR MORE INFORMATION  www.smokefree.gov  Document Released: 08/14/2001 Document Revised: 01/04/2014 Document Reviewed: 11/29/2011 [employer] Patient Information 2015 [employer]. This information is not intended to replace advice given to you by your health care provider. Make sure you discuss any questions you have with your health care provider.    Smoking Cessation, Tips for Success If you are ready to quit smoking, congratulations! You have chosen to help yourself be healthier. Cigarettes bring nicotine, tar, carbon monoxide, and other irritants into your body. Your lungs, heart, and blood vessels will be able to work better without these poisons. There are many different ways to quit smoking. Nicotine gum, nicotine patches, a nicotine inhaler, or nicotine nasal spray can help with physical craving. Hypnosis, support groups, and medicines help break the habit of smoking. WHAT THINGS CAN I DO TO MAKE QUITTING EASIER?  Here are some tips to help you quit for good:  Pick a date when you will quit smoking completely. Tell all of your friends and family about your plan to quit on that date.  Do not try to slowly cut down on the number of cigarettes you are smoking. Pick a quit date and quit smoking completely starting on that day.  Throw away all cigarettes.   Clean and remove all ashtrays from your home, work, and car.  On a card, write down your reasons for quitting. Carry the card with you and read it  when you get the urge to smoke.  Cleanse your body   of nicotine. Drink enough water and fluids to keep your urine clear or pale yellow. Do this after quitting to flush the nicotine from your body.  Learn to predict your moods. Do not let a bad situation be your excuse to have a cigarette. Some situations in your life might tempt you into wanting a cigarette.  Never have "just one" cigarette. It leads to wanting another and another. Remind yourself of your decision to quit.  Change habits associated with smoking. If you smoked while driving or when feeling stressed, try other activities to replace smoking. Stand up when drinking your coffee. Brush your teeth after eating. Sit in a different chair when you read the paper. Avoid alcohol while trying to quit, and try to drink fewer caffeinated beverages. Alcohol and caffeine may urge you to smoke.  Avoid foods and drinks that can trigger a desire to smoke, such as sugary or spicy foods and alcohol.  Ask people who smoke not to smoke around you.  Have something planned to do right after eating or having a cup of coffee. For example, plan to take a walk or exercise.  Try a relaxation exercise to calm you down and decrease your stress. Remember, you may be tense and nervous for the first 2 weeks after you quit, but this will pass.  Find new activities to keep your hands busy. Play with a pen, coin, or rubber band. Doodle or draw things on paper.  Brush your teeth right after eating. This will help cut down on the craving for the taste of tobacco after meals. You can also try mouthwash.   Use oral substitutes in place of cigarettes. Try using lemon drops, carrots, cinnamon sticks, or chewing gum. Keep them handy so they are available when you have the urge to smoke.  When you have the urge to smoke, try deep breathing.  Designate your home as a nonsmoking area.  If you are a heavy smoker, ask your health care provider about a prescription for  nicotine chewing gum. It can ease your withdrawal from nicotine.  Reward yourself. Set aside the cigarette money you save and buy yourself something nice.  Look for support from others. Join a support group or smoking cessation program. Ask someone at home or at work to help you with your plan to quit smoking.  Always ask yourself, "Do I need this cigarette or is this just a reflex?" Tell yourself, "Today, I choose not to smoke," or "I do not want to smoke." You are reminding yourself of your decision to quit.  Do not replace cigarette smoking with electronic cigarettes (commonly called e-cigarettes). The safety of e-cigarettes is unknown, and some may contain harmful chemicals.  If you relapse, do not give up! Plan ahead and think about what you will do the next time you get the urge to smoke. HOW WILL I FEEL WHEN I QUIT SMOKING? You may have symptoms of withdrawal because your body is used to nicotine (the addictive substance in cigarettes). You may crave cigarettes, be irritable, feel very hungry, cough often, get headaches, or have difficulty concentrating. The withdrawal symptoms are only temporary. They are strongest when you first quit but will go away within 10-14 days. When withdrawal symptoms occur, stay in control. Think about your reasons for quitting. Remind yourself that these are signs that your body is healing and getting used to being without cigarettes. Remember that withdrawal symptoms are easier to treat than the major diseases that smoking can cause.    Even after the withdrawal is over, expect periodic urges to smoke. However, these cravings are generally short lived and will go away whether you smoke or not. Do not smoke! WHAT RESOURCES ARE AVAILABLE TO HELP ME QUIT SMOKING? Your health care provider can direct you to community resources or hospitals for support, which may include:  Group support.  Education.  Hypnosis.  Therapy. Document Released: 05/18/2004 Document  Revised: 01/04/2014 Document Reviewed: 02/05/2013 [employer] Patient Information 2015 [employer]. This information is not intended to replace advice given to you by your health care provider. Make sure you discuss any questions you have with your health care provider.  

## 2014-11-03 ENCOUNTER — Other Ambulatory Visit: Payer: Self-pay | Admitting: *Deleted

## 2014-11-03 DIAGNOSIS — I6523 Occlusion and stenosis of bilateral carotid arteries: Secondary | ICD-10-CM

## 2014-11-10 ENCOUNTER — Encounter: Payer: Self-pay | Admitting: Vascular Surgery

## 2014-11-11 ENCOUNTER — Ambulatory Visit: Payer: Medicare Other | Admitting: Vascular Surgery

## 2014-11-11 ENCOUNTER — Inpatient Hospital Stay (HOSPITAL_COMMUNITY): Admission: RE | Admit: 2014-11-11 | Payer: Medicare Other | Source: Ambulatory Visit

## 2014-11-16 ENCOUNTER — Encounter: Payer: Self-pay | Admitting: Vascular Surgery

## 2014-11-17 ENCOUNTER — Ambulatory Visit (INDEPENDENT_AMBULATORY_CARE_PROVIDER_SITE_OTHER): Payer: Medicare Other | Admitting: Vascular Surgery

## 2014-11-17 ENCOUNTER — Encounter: Payer: Self-pay | Admitting: Vascular Surgery

## 2014-11-17 ENCOUNTER — Ambulatory Visit (HOSPITAL_COMMUNITY)
Admission: RE | Admit: 2014-11-17 | Discharge: 2014-11-17 | Disposition: A | Discharge status: Home / Self Care | Payer: Medicare Other | Source: Ambulatory Visit | Attending: Vascular Surgery | Admitting: Vascular Surgery

## 2014-11-17 VITALS — BP 130/72 | HR 89 | Resp 20 | Ht 72.0 in | Wt 221.0 lb

## 2014-11-17 DIAGNOSIS — I6522 Occlusion and stenosis of left carotid artery: Secondary | ICD-10-CM | POA: Diagnosis not present

## 2014-11-17 DIAGNOSIS — I739 Peripheral vascular disease, unspecified: Secondary | ICD-10-CM | POA: Insufficient documentation

## 2014-11-17 DIAGNOSIS — I6523 Occlusion and stenosis of bilateral carotid arteries: Secondary | ICD-10-CM | POA: Diagnosis not present

## 2014-11-17 NOTE — Progress Notes (Signed)
[facility] HISTORY AND PHYSICAL   History of Present Illness:  [name] is a 64 y.o. year old male who presents for evaluation of asymptomatic left internal carotid artery stenosis. [name] was seen by our nurse practitioner a few weeks ago and noted to have a carotid bruit. He has known moderate carotid stenosis in the past which has now progressed. He denies any symptoms of TIA amaurosis or stroke. The [name] has also had a previous left femoral to anterior tibial bypass by me in 2010. He has a chronic left foot drop predating this. He states the foot drop is possibly due to a back injury he had several years ago. His ABI on the right side was noted recently to be 0.3. However, he denies any symptoms of rest pain or nonhealing wounds or claudication symptoms. He ambulates with a walker. His most recent left leg ABI was 0.65. He is overall fairly debilitated.  Other medical problems include coronary artery disease with prior history of CHF. Most recent ejection fraction by noninvasive testing was 40% in 2011. He has had previous coronary artery bypass grafting with vein harvest from the right leg. He also has a history of elevated cholesterol and hypertension. He states these problems are currently stable. He has noticed recently more swelling in both lower extremities. He also has become short of breath at rest on occasion.  Past Medical History  Diagnosis Date  . Hypertension   . CHF (congestive heart failure)   . Coronary artery disease   . CVA (cerebral infarction)   . High cholesterol   . Peripheral vascular disease   . Myocardial infarction ~ 2011  . Stroke ~ 2009    "left eye; totally blind there"   . Blind left eye ~ 2009  . DVT (deep venous thrombosis)   . Carotid artery occlusion     Past Surgical History  Procedure Laterality Date  . Cardiac catheterization    . Coronary artery bypass graft  ?2011  . Peripheral arterial stent graft  2000's    BLE     Social History History  Substance Use Topics  . Smoking status: Former Smoker -- 0.12 packs/day for 48 years    Types: Cigarettes    Start date: 10/27/2014  . Smokeless tobacco: Never Used  . Alcohol Use: 1.8 oz/week    3 Cans of beer per week     Comment: 06/30/2012 "used to drink constantly; now maybe 1/2 beer qod"    Family History Family History  Problem Relation Age of Onset  . Diabetes Sister   . Heart disease Sister   . Heart disease Brother     Allergies  No Known Allergies   Current Outpatient Prescriptions  Medication Sig Dispense Refill  . aspirin EC 81 MG tablet Take 1 tablet (81 mg total) by mouth daily. 30 tablet 3  . atorvastatin (LIPITOR) 20 MG tablet Take 20 mg by mouth daily.    . hydrALAZINE (APRESOLINE) 50 MG tablet Take 1 tablet (50 mg total) by mouth 2 (two) times daily. 60 tablet 3  . lisinopril (PRINIVIL,ZESTRIL) 10 MG tablet Take 2 tablets (20 mg total) by mouth daily. 60 tablet 3  . Multiple Vitamin (MULTIVITAMIN WITH MINERALS) TABS tablet Take 1 tablet by mouth daily.    . traMADol (ULTRAM) 50 MG tablet Take 1 tablet (50 mg total) by mouth every 6 (six) hours as needed. For pain 60 tablet 0   No current facility-administered medications for this  visit.    ROS:   General:  No weight loss, Fever, chills  HEENT: No recent headaches, no nasal bleeding, no visual changes, no sore throat  Neurologic: No dizziness, blackouts, seizures. No recent symptoms of stroke or mini- stroke. No recent episodes of slurred speech, or temporary blindness.  Cardiac: No recent episodes of chest pain/pressure, no shortness of breath at rest.  + shortness of breath with exertion.  Denies history of atrial fibrillation or irregular heartbeat  Vascular: No history of rest pain in feet.  No history of claudication.  No history of non-healing ulcer, No history of DVT   Pulmonary: No home oxygen, no productive cough, no hemoptysis,  No asthma or  wheezing  Musculoskeletal:  [ ]  Arthritis, [ ]  Low back pain,  [ ]  Joint pain  Hematologic:No history of hypercoagulable state.  No history of easy bleeding.  No history of anemia  Gastrointestinal: No hematochezia or melena,  No gastroesophageal reflux, no trouble swallowing  Urinary: [ ]  chronic Kidney disease, [ ]  on HD - [ ]  MWF or [ ]  TTHS, [ ]  Burning with urination, [ ]  Frequent urination, [ ]  Difficulty urinating;   Skin: No rashes  Psychological: No history of anxiety,  No history of depression   Physical Examination  Filed Vitals:   11/17/14 1535 11/17/14 1538  BP: 131/85 130/72  Pulse: 97 89  Resp: 20   Height: 6' (1.829 m)   Weight: 221 lb (100.245 kg)     Body mass index is 29.97 kg/(m^2).  General:  Alert and oriented, no acute distress HEENT: Normal Neck: Left-sided carotid bruit Pulmonary: Clear to auscultation bilaterally Cardiac: Regular Rate and Rhythm without murmur Abdomen: Soft, non-tender, non-distended, no mass, slightly obese  Skin: No rash Extremity Pulses:  2+ radial, brachial, femoral, absent right dorsalis pedis, posterior tibial pulses 2+ graft pulse left lateral leg, left foot drop, no dorsalis pedis pulse on the left. Musculoskeletal: Foot drop left leg as mentioned above. Trace to 1+ edema left leg and right leg symmetric  Neurologic: Upper and lower extremity motor 4/5 and symmetric  DATA:  I reviewed the [name]'s lower extremity duplex dated fibrillar 19th 2016. This shows a patent bypass graft to the anterior tibial artery and the left leg. ABI 0.65 ABI on the right 0.3. I also reviewed the [name]'s carotid duplex scan dated 11/17/2014. I reviewed and interpreted this study. Greater than 80% left internal carotid artery stenosis less than 40% right internal carotid artery stenosis   ASSESSMENT:  [name] with greater than 80% asymptomatic left internal carotid artery stenosis. [name] overall is fairly debilitated. He also has shortness  of breath at rest and some lower extremity edema. He has a significant cardiac history.  History this is PAD is concerned. His bypass in the left leg is patent. His ABI on that side is also reasonable. Although his ABI has declined on the right side he is asymptomatic. I would not consider any intervention in the right leg unless he has evidence of tissue loss or rest pain. He is overall fairly debilitated and I do not believe he needs an intervention based on ABI findings alone.   PLAN:  [name] will continue his aspirin daily. He will undergo cardiac risk stratification. Based on the cardiac findings will make a determination on whether or not he would be a candidate for carotid endarterectomy. The lesion was fairly calcified on carotid duplex scans that he may not be a candidate for carotid stenting. Fortunately, he  is asymptomatic. This means that his stroke risk is 5% or less per year with intensive medical management.  Ruta Hinds, MD [facility] Office: [phone] Pager: [phone]

## 2014-11-18 ENCOUNTER — Telehealth: Payer: Self-pay | Admitting: Cardiology

## 2014-11-18 ENCOUNTER — Other Ambulatory Visit: Payer: Self-pay

## 2014-11-18 NOTE — Telephone Encounter (Signed)
11/18/2014 Spoke to [employer], per [employer], reinstate patient to the practice. Patient dismissed 08/13/2006 due to multiple cancelled / no show appointments by [name], MD. Dismissal removed, rmf.

## 2014-11-29 ENCOUNTER — Other Ambulatory Visit (HOSPITAL_COMMUNITY): Payer: Medicare Other

## 2014-12-01 NOTE — Progress Notes (Signed)
      HPI: FU coronary artery disease. Patient is status post coronary artery bypass graft in 2010. Nuclear study in March 2011 showed an ejection fraction of 46%. There was a large lateral defect but no ischemia. Patient also has peripheral vascular disease followed by vascular surgery. Recently admitted with left lower extremity pain. Echocardiogram January 2016 showed normal LV function and mild left atrial enlargement. We were asked to evaluate for sinus tachycardia. No further workup was performed. Since discharge  Current Outpatient Prescriptions  Medication Sig Dispense Refill  . aspirin EC 81 MG tablet Take 1 tablet (81 mg total) by mouth daily. 30 tablet 3  . atorvastatin (LIPITOR) 20 MG tablet Take 20 mg by mouth daily.    . hydrALAZINE (APRESOLINE) 50 MG tablet Take 1 tablet (50 mg total) by mouth 2 (two) times daily. 60 tablet 3  . lisinopril (PRINIVIL,ZESTRIL) 10 MG tablet Take 2 tablets (20 mg total) by mouth daily. 60 tablet 3  . Multiple Vitamin (MULTIVITAMIN WITH MINERALS) TABS tablet Take 1 tablet by mouth daily.    . traMADol (ULTRAM) 50 MG tablet Take 1 tablet (50 mg total) by mouth every 6 (six) hours as needed. For pain 60 tablet 0   No current facility-administered medications for this visit.     Past Medical History  Diagnosis Date  . Hypertension   . CHF (congestive heart failure)   . Coronary artery disease   . CVA (cerebral infarction)   . High cholesterol   . Peripheral vascular disease   . Myocardial infarction ~ 2011  . Stroke ~ 2009    "left eye; totally blind there"   . Blind left eye ~ 2009  . DVT (deep venous thrombosis)   . Carotid artery occlusion     Past Surgical History  Procedure Laterality Date  . Cardiac catheterization    . Coronary artery bypass graft  ?2011  . Peripheral arterial stent graft  2000's    BLE    History   Social History  . Marital Status: Married    Spouse Name: N/A  . Number of Children: N/A  . Years of  Education: N/A   Occupational History  . Not on file.   Social History Main Topics  . Smoking status: Former Smoker -- 0.12 packs/day for 48 years    Types: Cigarettes    Start date: 10/27/2014  . Smokeless tobacco: Never Used  . Alcohol Use: 1.8 oz/week    3 Cans of beer per week     Comment: 06/30/2012 "used to drink constantly; now maybe 1/2 beer qod"  . Drug Use: Yes    Special: "Crack" cocaine     Comment: 06/30/2012 "stopped ~ 1 yr ago"  . Sexual Activity: No   Other Topics Concern  . Not on file   Social History Narrative    ROS: no fevers or chills, productive cough, hemoptysis, dysphasia, odynophagia, melena, hematochezia, dysuria, hematuria, rash, seizure activity, orthopnea, PND, pedal edema, claudication. Remaining systems are negative.  Physical Exam: Well-developed well-nourished in no acute distress.  Skin is warm and dry.  HEENT is normal.  Neck is supple.  Chest is clear to auscultation with normal expansion.  Cardiovascular exam is regular rate and rhythm.  Abdominal exam nontender or distended. No masses palpated. Extremities show no edema. neuro grossly intact  ECG     This encounter was created in error - please disregard.

## 2014-12-02 ENCOUNTER — Encounter (HOSPITAL_COMMUNITY): Payer: Self-pay | Admitting: Pharmacy Technician

## 2014-12-06 ENCOUNTER — Encounter: Payer: Medicare Other | Admitting: Cardiology

## 2014-12-06 ENCOUNTER — Encounter (HOSPITAL_COMMUNITY): Admission: RE | Payer: Self-pay | Source: Ambulatory Visit

## 2014-12-06 ENCOUNTER — Ambulatory Visit (HOSPITAL_COMMUNITY)
Admission: RE | Admit: 2014-12-06 | Payer: Medicare Other | Source: Ambulatory Visit | Admitting: Oral and Maxillofacial Surgery

## 2014-12-06 SURGERY — MULTIPLE EXTRACTION WITH ALVEOLOPLASTY
Anesthesia: General | Site: Mouth | Laterality: Bilateral

## 2014-12-07 ENCOUNTER — Other Ambulatory Visit: Payer: Self-pay

## 2014-12-07 ENCOUNTER — Encounter (HOSPITAL_COMMUNITY): Payer: Self-pay | Admitting: Emergency Medicine

## 2014-12-07 ENCOUNTER — Encounter (HOSPITAL_COMMUNITY): Payer: Self-pay

## 2014-12-07 ENCOUNTER — Emergency Department (HOSPITAL_COMMUNITY): Payer: Medicare Other

## 2014-12-07 ENCOUNTER — Emergency Department (HOSPITAL_COMMUNITY)
Admission: EM | Admit: 2014-12-07 | Discharge: 2014-12-07 | Disposition: A | Discharge status: Home / Self Care | Payer: Medicare Other | Attending: Emergency Medicine | Admitting: Emergency Medicine

## 2014-12-07 ENCOUNTER — Encounter (HOSPITAL_COMMUNITY)
Admission: RE | Admit: 2014-12-07 | Discharge: 2014-12-07 | Disposition: A | Discharge status: Home / Self Care | Payer: Medicare Other | Source: Ambulatory Visit | Attending: Vascular Surgery | Admitting: Vascular Surgery

## 2014-12-07 DIAGNOSIS — R0789 Other chest pain: Secondary | ICD-10-CM | POA: Insufficient documentation

## 2014-12-07 DIAGNOSIS — R609 Edema, unspecified: Secondary | ICD-10-CM | POA: Insufficient documentation

## 2014-12-07 DIAGNOSIS — Z87891 Personal history of nicotine dependence: Secondary | ICD-10-CM | POA: Insufficient documentation

## 2014-12-07 DIAGNOSIS — Z8673 Personal history of transient ischemic attack (TIA), and cerebral infarction without residual deficits: Secondary | ICD-10-CM | POA: Insufficient documentation

## 2014-12-07 DIAGNOSIS — H5442 Blindness, left eye, normal vision right eye: Secondary | ICD-10-CM | POA: Diagnosis not present

## 2014-12-07 DIAGNOSIS — I509 Heart failure, unspecified: Secondary | ICD-10-CM | POA: Insufficient documentation

## 2014-12-07 DIAGNOSIS — I252 Old myocardial infarction: Secondary | ICD-10-CM | POA: Insufficient documentation

## 2014-12-07 DIAGNOSIS — Z79899 Other long term (current) drug therapy: Secondary | ICD-10-CM | POA: Insufficient documentation

## 2014-12-07 DIAGNOSIS — I2510 Atherosclerotic heart disease of native coronary artery without angina pectoris: Secondary | ICD-10-CM | POA: Diagnosis not present

## 2014-12-07 DIAGNOSIS — Z7982 Long term (current) use of aspirin: Secondary | ICD-10-CM | POA: Insufficient documentation

## 2014-12-07 DIAGNOSIS — Z9889 Other specified postprocedural states: Secondary | ICD-10-CM | POA: Diagnosis not present

## 2014-12-07 DIAGNOSIS — I10 Essential (primary) hypertension: Secondary | ICD-10-CM | POA: Diagnosis not present

## 2014-12-07 DIAGNOSIS — E780 Pure hypercholesterolemia: Secondary | ICD-10-CM | POA: Insufficient documentation

## 2014-12-07 DIAGNOSIS — Z86718 Personal history of other venous thrombosis and embolism: Secondary | ICD-10-CM | POA: Diagnosis not present

## 2014-12-07 DIAGNOSIS — R079 Chest pain, unspecified: Secondary | ICD-10-CM | POA: Diagnosis present

## 2014-12-07 LAB — CBC WITH DIFFERENTIAL/PLATELET
Basophils Absolute: 0 10*3/uL (ref 0.0–0.1)
Basophils Relative: 0 % (ref 0–1)
Eosinophils Absolute: 0.1 10*3/uL (ref 0.0–0.7)
Eosinophils Relative: 1 % (ref 0–5)
HCT: 44.2 % (ref 39.0–52.0)
Hemoglobin: 14.6 g/dL (ref 13.0–17.0)
Lymphocytes Relative: 48 % — ABNORMAL HIGH (ref 12–46)
Lymphs Abs: 3 10*3/uL (ref 0.7–4.0)
MCH: 26.5 pg (ref 26.0–34.0)
MCHC: 33 g/dL (ref 30.0–36.0)
MCV: 80.4 fL (ref 78.0–100.0)
Monocytes Absolute: 0.5 10*3/uL (ref 0.1–1.0)
Monocytes Relative: 8 % (ref 3–12)
Neutro Abs: 2.7 10*3/uL (ref 1.7–7.7)
Neutrophils Relative %: 43 % (ref 43–77)
Platelets: 223 10*3/uL (ref 150–400)
RBC: 5.5 MIL/uL (ref 4.22–5.81)
RDW: 15.3 % (ref 11.5–15.5)
WBC: 6.3 10*3/uL (ref 4.0–10.5)

## 2014-12-07 LAB — TROPONIN I: Troponin I: <0.03 ng/mL (ref <0.031)

## 2014-12-07 LAB — BASIC METABOLIC PANEL
Anion gap: 10 (ref 5–15)
BUN: 12 mg/dL (ref 6–23)
CO2: 20 mmol/L (ref 19–32)
Calcium: 9.3 mg/dL (ref 8.4–10.5)
Chloride: 111 mmol/L (ref 96–112)
Creatinine, Ser: 1.37 mg/dL — ABNORMAL HIGH (ref 0.50–1.35)
GFR calc Af Amer: 62 mL/min — ABNORMAL LOW (ref >90)
GFR calc non Af Amer: 53 mL/min — ABNORMAL LOW (ref >90)
Glucose, Bld: 96 mg/dL (ref 70–99)
Potassium: 4 mmol/L (ref 3.5–5.1)
Sodium: 141 mmol/L (ref 135–145)

## 2014-12-07 LAB — I-STAT TROPONIN, ED: Troponin i, poc: 0.01 ng/mL (ref 0.00–0.08)

## 2014-12-07 NOTE — ED Notes (Signed)
Pt c/o left sided CP while having pre op appointment; pt denies SOB and denies pain at present

## 2014-12-07 NOTE — ED Provider Notes (Signed)
CSN: [id number]     Arrival date & time 12/07/14  1431 History   First MD Initiated Contact with Patient 12/07/14 1653     Chief Complaint  Patient presents with  . Chest Pain     (Consider location/radiation/quality/duration/timing/severity/associated sxs/prior Treatment) HPI   64yM with CP. Noticed when woke up this morning. Center of chest. "Just a pain." Lasted several hours until eased off around 1100 and has been pain free since then. Associated symptoms of dyspnea. No dizziness, lightheadedness, nausea or diaphoresis. No cough. No fever or chills. Has some mild LE edema, but says this is chronic. Pre-op appointment for CEA today and referred to ED because of CP. Has known CAD s/p CABG.   Past Medical History  Diagnosis Date  . Hypertension   . CHF (congestive heart failure)   . Coronary artery disease   . CVA (cerebral infarction)   . High cholesterol   . Peripheral vascular disease   . Myocardial infarction ~ 2011  . Stroke ~ 2009    "left eye; totally blind there"   . Blind left eye ~ 2009  . DVT (deep venous thrombosis)   . Carotid artery occlusion    Past Surgical History  Procedure Laterality Date  . Cardiac catheterization    . Coronary artery bypass graft  ?2011  . Peripheral arterial stent graft  2000's    BLE  . Back surgery      1998-99 had disc removed from back  . Appendectomy     Family History  Problem Relation Age of Onset  . Diabetes Sister   . Heart disease Sister   . Heart disease Brother    History  Substance Use Topics  . Smoking status: Former Smoker -- 0.12 packs/day for 48 years    Types: Cigarettes    Start date: 10/27/2014  . Smokeless tobacco: Never Used  . Alcohol Use: 1.8 oz/week    3 Cans of beer per week     Comment: 06/30/2012 "used to drink constantly; now maybe 1/2 beer qod"    Review of Systems  All systems reviewed and negative, other than as noted in HPI.   Allergies  Review of patient's allergies indicates no  known allergies.  Home Medications   Prior to Admission medications   Medication Sig Start Date End Date Taking? Authorizing Provider  aspirin EC 81 MG tablet Take 1 tablet (81 mg total) by mouth daily. 07/01/12   Ripudeep Krystal Eaton, MD  atorvastatin (LIPITOR) 10 MG tablet Take 10 mg by mouth daily.    Historical Provider, MD  hydrALAZINE (APRESOLINE) 50 MG tablet Take 1 tablet (50 mg total) by mouth 2 (two) times daily. Patient not taking: Reported on 12/02/2014 09/29/14   Samella Parr, NP  lisinopril (PRINIVIL,ZESTRIL) 10 MG tablet Take 2 tablets (20 mg total) by mouth daily. 09/29/14   Samella Parr, NP  Multiple Vitamin (MULTIVITAMIN WITH MINERALS) TABS tablet Take 1 tablet by mouth daily. Patient not taking: Reported on 12/02/2014 09/28/14   Samella Parr, NP  traMADol (ULTRAM) 50 MG tablet Take 1 tablet (50 mg total) by mouth every 6 (six) hours as needed. For pain 10/22/14   Sharmon Leyden Nickel, NP   BP 117/66 mmHg  Pulse 70  Temp(Src) 97.8 F (36.6 C) (Oral)  Resp 18  SpO2 100% Physical Exam  Constitutional: He appears well-developed and well-nourished. No distress.  HENT:  Head: Normocephalic and atraumatic.  Eyes: Conjunctivae are normal. Right eye exhibits no  discharge. Left eye exhibits no discharge.  Neck: Neck supple.  Cardiovascular: Normal rate, regular rhythm and normal heart sounds.  Exam reveals no gallop and no friction rub.   No murmur heard. Pulmonary/Chest: Effort normal and breath sounds normal. No respiratory distress.  Abdominal: Soft. He exhibits no distension. There is no tenderness.  Musculoskeletal: He exhibits no edema or tenderness.  Mild symmetric pitting LE edema.   Neurological: He is alert.  Skin: Skin is warm and dry.  Psychiatric: He has a normal mood and affect. His behavior is normal. Thought content normal.  Nursing note and vitals reviewed.   ED Course  Procedures (including critical care time) Labs Review Labs Reviewed  BASIC METABOLIC  PANEL - Abnormal; Notable for the following:    Creatinine, Ser 1.37 (*)    GFR calc non Af Amer 53 (*)    GFR calc Af Amer 62 (*)    All other components within normal limits  CBC WITH DIFFERENTIAL/PLATELET - Abnormal; Notable for the following:    Lymphocytes Relative 48 (*)    All other components within normal limits  I-STAT TROPOININ, ED    Imaging Review Dg Chest 2 View  12/07/2014   CLINICAL DATA:  Chest pain. Congestive heart failure. Hypertension.  EXAM: CHEST  2 VIEW  COMPARISON:  06/29/2012  FINDINGS: Prior CABG. Atherosclerotic aortic arch. Moderate enlargement of the cardiopericardial silhouette, without edema.  Linear subsegmental atelectasis along both hemidiaphragms. No pleural effusion.  IMPRESSION: High  1. Linear subsegmental atelectasis along both hemidiaphragms. 2. Moderate enlargement of the cardiopericardial silhouette, without edema. Prior CABG. 3. Atherosclerosis.   Electronically Signed   By: Van Clines M.D.   On: 12/07/2014 16:08     EKG Interpretation   Date/Time:  Tuesday December 07 2014 14:41:02 EDT Ventricular Rate:  80 PR Interval:  158 QRS Duration: 78 QT Interval:  394 QTC Calculation: [id number] Axis:   30 Text Interpretation:  Sinus rhythm with marked sinus arrhythmia Confirmed  by Wilson Singer  MD, Chloey Ricard ([id number]) on 12/07/2014 4:55:27 PM      MDM   Final diagnoses:  Other chest pain    64yM with CP this am which has since resolved. Has been pain free for ~6 hours now (1700). Initial w/u unremarkable. Will check another troponin. Anticipate DC if also normal. Doubt infectious, PE or dissection. Reports upcoming cardiology appointment on 3/8. It has been determined that no acute conditions requiring further emergency intervention are present at this time. The patient has been advised of the diagnosis and plan. I reviewed any labs and imaging including any potential incidental findings. We have discussed signs and symptoms that warrant return to the ED  and they are listed in the discharge instructions.      Virgel Manifold, MD 12/07/14 [phone]

## 2014-12-07 NOTE — Discharge Instructions (Signed)

## 2014-12-07 NOTE — Progress Notes (Signed)
Anesthesia Note:  Patient is a 64 year old male scheduled for left CEA on 12/13/14 by Dr. Oneida Alar.  History includes former smoker, HTN, NSTEMI '06 s/p DES CX marginal and AV branch, STEMI s/p PTCA OM1 08/2009 followed by CABG X 4 08/23/09 (LIMA to LAD, SVG to OM1 and OM2, SVG to RCA), CHF (EF 35-40% 11/2006, 55% 09/2014), PVD s/p left Fem-AT BPG '10, DVT, chronic left foot drop, 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 He was last admitted on [facility] on 09/26/14 for uncontrolled hypertension due to medication non-compliance and LLE pain and weakness.  During his hospitalization he had an episode of paroxysmal SVT and a one second nocturnal pause.  He was admitted by the Hospitalist Service with neurology and cardiology consulting.  He was seen by cardiolgoist Dr. Stanford Breed (primary cardiologist is Dr. Acie Fredrickson) for PSVT which he felt was likely ST with non-conducted PACs.  Echo showed normal EF and wall motion and no further cardiac work-up was felt indicated at that time. He also did not feel b-blocker therapy was indicated.  He is currently scheduled to see Richardson Dopp, PA-C with cardiology on 12/10/14. PCP is listed as Dr. Nolene Ebbs.    PAT RN reports that patient presented to PAT this afternoon with his fiance of 35 years.  He reported complaints of chest pain and was sent to the ED.    Meds include ASA, Lipitor, hydralazine, lisinopril, tramadol.  12/07/14 EKG: SR with marked sinus arrhythmia.  09/27/14 Echo: - Left ventricle: The cavity size was normal. Wall thickness was increased in a pattern of mild LVH. The estimated ejection fraction was 55%. Wall motion was normal; there were no regional wall motion abnormalities. Doppler parameters are consistent with abnormal left ventricular relaxation (grade 1 diastolic dysfunction). - Left atrium: The atrium was mildly dilated. - Right ventricle: The cavity size was normal. Systolic  function was normal.  11/25/09 Nuclear stress test: IMPRESSION: Large fixed defect involving the lateral wall extending into the anterior and inferior walls as described. EF 46%. No stress induced ischemia.  Last cath seen is pre-CABG, done on 08/17/09. See Notes tab for full details.  11/17/14 Carotid duplex: < 40% RICA stenosis, 123456 LICA stenosis, bilateral ECA stenosis, bilateral vertebral artery flow is antegrade.  12/07/14 labs from 1446 and 1501 noted.  Troponin 0.01.   Further recommendations pending ED evaluation for chest pain. If discharged it will be important for him to keep his scheduled follow-up with cardiology for 12/10/14.  George Hugh [facility] Short Stay Center/Anesthesiology Phone [phone] 12/07/2014 4:02 PM

## 2014-12-07 NOTE — Progress Notes (Signed)
During the admission process, pt "stated" that his chest, pointing to med sternal area was hurting.  His girlfriend stated that this was unusual and he really doesn't complain about things.  Pt is appears restless and 'antsey'.  I have alerted the ER and I also spoke with Colletta Maryland @ [facility] and informed them in change of plans at this point.

## 2014-12-10 ENCOUNTER — Encounter: Payer: Medicare Other | Admitting: Physician Assistant

## 2014-12-10 DIAGNOSIS — I6529 Occlusion and stenosis of unspecified carotid artery: Secondary | ICD-10-CM | POA: Insufficient documentation

## 2014-12-10 DIAGNOSIS — E785 Hyperlipidemia, unspecified: Secondary | ICD-10-CM | POA: Insufficient documentation

## 2014-12-10 NOTE — Progress Notes (Signed)
Cardiology Office Note   Date:  12/10/2014   ID:  [name], DOB [birth year], MRN [id number]  PCP:  Vonna Drafts., FNP  Cardiologist:       Chief Complaint  Patient presents with  . Surgical Clearance  . Coronary Artery Disease     History of Present Illness: [name] is a 64 y.o. male with a hx of CAD s/p DES to mid CFX extending into OM1 in 2006 and s/p lateral STEMI treated with POBA and thrombectomy of OM1 and subsequent CABG x 4 (L-LAD, S-OM1/OM2, S-RCA), PAD s/p L fem to ant tibial bypass in 2010, HTN, HL, prior drug abuse (cocaine) and ETOH abuse.  He was DC from the practice by Dr. Verl Blalock in 2007 secondary to poor compliance with follow up.    Evaluated by Cardiology (Dr. [name]) for sinus tachy during admission for left foot pain in 09/2014.  Echo demonstrated normal LVF.  No further evaluation or management was felt to be necessary.  Recently seen by Dr. Oneida Alar for evaluation of asymptomatic LICA stenosis (> 123XX123).  He needs surgical clearance.  He was seen in the ED 12/07/14 with chest pain.  CEs remained neg.      Studies/Reports Reviewed Today:  Carotid US 11/17/14 R < 40%; L 80-99%  Echo 09/27/14 - Mild LVH. EF 55%. Wall motion was normal.  Grade 1 diastolic dysfunction  - Left atrium: The atrium was mildly dilated. - Right ventricle: The cavity size was normal. Systolic function was normal.  Myoview 11/2009 IMPRESSION:  Large fixed defect involving the lateral wall extending into the anterior and inferior walls as described. Ejection fraction: 46%. No stress induced ischemia.  Cardiac Cath 08/2009 LM: 30% LAD:  prox 80% LCx:  Mid stent 80% ISR, Prox OM1 99% (large thrombus burden), OM2 80% RCA:  Mid 80% and 95% EF:  60% mild AL hypokinesis PCI:  POBA to OM1  Past Medical History  Diagnosis Date  . Hypertension   . CHF (congestive heart failure)   . Coronary artery disease   . CVA (cerebral infarction)   . High cholesterol   .  Peripheral vascular disease   . Myocardial infarction ~ 2011  . Stroke ~ 2009    "left eye; totally blind there"   . Blind left eye ~ 2009  . DVT (deep venous thrombosis)   . Carotid artery occlusion     Past Surgical History  Procedure Laterality Date  . Cardiac catheterization    . Coronary artery bypass graft  ?2011  . Peripheral arterial stent graft  2000's    BLE  . Back surgery      1998-99 had disc removed from back  . Appendectomy       Current Outpatient Prescriptions  Medication Sig Dispense Refill  . amLODipine (NORVASC) 10 MG tablet Take 10 mg by mouth daily.    [name] aspirin EC 81 MG tablet Take 1 tablet (81 mg total) by mouth daily. 30 tablet 3  . atorvastatin (LIPITOR) 10 MG tablet Take 10 mg by mouth daily.    [name] atorvastatin (LIPITOR) 10 MG tablet Take 10 mg by mouth daily.    . hydrALAZINE (APRESOLINE) 50 MG tablet Take 1 tablet (50 mg total) by mouth 2 (two) times daily. (Patient not taking: Reported on 12/02/2014) 60 tablet 3  . lisinopril (PRINIVIL,ZESTRIL) 10 MG tablet Take 2 tablets (20 mg total) by mouth daily. 60 tablet 3  . Multiple Vitamin (MULTIVITAMIN WITH MINERALS) TABS tablet Take  1 tablet by mouth daily.    . traMADol (ULTRAM) 50 MG tablet Take 1 tablet (50 mg total) by mouth every 6 (six) hours as needed. For pain 60 tablet 0   No current facility-administered medications for this visit.    Allergies:   Review of patient's allergies indicates no known allergies.    Social History:  The patient  reports that he has quit smoking. His smoking use included Cigarettes. He started smoking about 6 weeks ago. He has a 5.76 pack-year smoking history. He has never used smokeless tobacco. He reports that he drinks about 1.8 oz of alcohol per week. He reports that he uses illicit drugs ("Crack" cocaine).   Family History:  The patient's family history includes Diabetes in his sister; Heart disease in his brother and sister.    ROS:   Please see the history  of present illness.   ROS    PHYSICAL EXAM: VS:  There were no vitals taken for this visit.    Wt Readings from Last 3 Encounters:  12/07/14 213 lb (96.616 kg)  11/17/14 221 lb (100.245 kg)  10/22/14 217 lb (98.431 kg)     GEN: Well nourished, well developed, in no acute distress HEENT: normal Neck:  JVD, carotid bruits, no masses Cardiac:  Normal S1/S2, RRR;  murmur ,  no rubs or gallops,  edema  Respiratory:  clear to auscultation bilaterally, no wheezing, rhonchi or rales. GI: soft, nontender, nondistended, + BS MS: no deformity or atrophy Skin: warm and dry  Neuro:  CNs II-XII intact, Strength and sensation are intact Psych: Normal affect   EKG:  EKG  ordered today.  It demonstrates:      Recent Labs: 09/28/2014: ALT 23; Magnesium 2.0; TSH 2.730 12/07/2014: BUN 12; Creatinine 1.37*; Hemoglobin 14.6; Platelets 223; Potassium 4.0; Sodium 141    Lipid Panel    Component Value Date/Time   CHOL  11/25/2009 0440    158        ATP III CLASSIFICATION:  <200     mg/dL   Desirable  200-239  mg/dL   Borderline High  >=240    mg/dL   High          TRIG 125 11/25/2009 0440   HDL 32* 11/25/2009 0440   CHOLHDL 4.9 11/25/2009 0440   VLDL 25 11/25/2009 0440   LDLCALC * 11/25/2009 0440    101        Total Cholesterol/HDL:CHD Risk Coronary Heart Disease Risk Table                     Men   Women  1/2 Average Risk   3.4   3.3  Average Risk       5.0   4.4  2 X Average Risk   9.6   7.1  3 X Average Risk  23.4   11.0        Use the calculated Patient Ratio above and the CHD Risk Table to determine the patient's CHD Risk.        ATP III CLASSIFICATION (LDL):  <100     mg/dL   Optimal  100-129  mg/dL   Near or Above                    Optimal  130-159  mg/dL   Borderline  160-189  mg/dL   High  >190     mg/dL   Very High      ASSESSMENT  AND PLAN:  Pre-operative cardiovascular examination  Coronary artery disease   Carotid stenosis, left  Essential  hypertension  HLD (hyperlipidemia)  Paroxysmal supraventricular tachycardia  PAD (peripheral artery disease)    Current medicines are reviewed at length with the patient today.  The patient  concerns regarding medicines.  The following changes have been made:    Labs/ tests ordered today include:  No orders of the defined types were placed in this encounter.     Disposition:   FU with    Signed, Richardson Dopp, PA-C, MHS 12/10/2014 8:42 AM    [facility] [address], [place], [place]  [address] Phone: [phone]; Fax: [phone]    This encounter was created in error - please disregard.

## 2014-12-15 ENCOUNTER — Encounter: Payer: Self-pay | Admitting: Physician Assistant

## 2014-12-15 ENCOUNTER — Ambulatory Visit (INDEPENDENT_AMBULATORY_CARE_PROVIDER_SITE_OTHER): Payer: Medicare Other | Admitting: Physician Assistant

## 2014-12-15 VITALS — BP 130/70 | HR 74 | Ht 72.0 in | Wt 214.0 lb

## 2014-12-15 DIAGNOSIS — I471 Supraventricular tachycardia, unspecified: Secondary | ICD-10-CM

## 2014-12-15 DIAGNOSIS — I6522 Occlusion and stenosis of left carotid artery: Secondary | ICD-10-CM

## 2014-12-15 DIAGNOSIS — I10 Essential (primary) hypertension: Secondary | ICD-10-CM

## 2014-12-15 DIAGNOSIS — R079 Chest pain, unspecified: Secondary | ICD-10-CM

## 2014-12-15 DIAGNOSIS — I2510 Atherosclerotic heart disease of native coronary artery without angina pectoris: Secondary | ICD-10-CM | POA: Diagnosis not present

## 2014-12-15 DIAGNOSIS — Z01810 Encounter for preprocedural cardiovascular examination: Secondary | ICD-10-CM

## 2014-12-15 DIAGNOSIS — I739 Peripheral vascular disease, unspecified: Secondary | ICD-10-CM

## 2014-12-15 DIAGNOSIS — E785 Hyperlipidemia, unspecified: Secondary | ICD-10-CM

## 2014-12-15 NOTE — Progress Notes (Signed)
Cardiology Office Note   Date:  12/15/2014   ID:  [name], DOB 03-21-1951, MRN [id number]  PCP:  Vonna Drafts., FNP  Cardiologist:  Dr. [name]    Chief Complaint  Patient presents with  . Surgical Clearance  . Coronary Artery Disease  . Carotid Stenosis     History of Present Illness: [name] is a 64 y.o. male with a hx of CAD s/p DES to mid CFX extending into OM1 in 2006 and s/p lateral STEMI treated with POBA and thrombectomy of OM1 and subsequent CABG x 4 (L-LAD, S-OM1/OM2, S-RCA) in 2010, PAD s/p L fem to ant tibial bypass in 2010, HTN, HL, prior drug abuse (cocaine) and ETOH abuse. He was DC from the practice by Dr. Verl Blalock in 2007 secondary to poor compliance with follow up.   Evaluated by Cardiology (Dr. [name]) for sinus tachy during admission for left foot pain in 09/2014. Echo demonstrated normal LVF. No further evaluation or management was felt to be necessary. Recently seen by Dr. Oneida Alar for evaluation of asymptomatic LICA stenosis (> 123XX123). He needs surgical clearance. He was seen in the ED 12/07/14 with chest pain. CEs remained neg.   He missed his appointment with me last week. He was rescheduled for today. He is here today with his fiance. He comes in a wheelchair. He has difficulty answering questions at times. It sounds as though he may have had a stroke in the past. He is not that active. He is fairly sedentary. He has extremely poor dentition. When asked if he has chest pain he says no. His fiance says that that is not true. When I asked him the question again, he tells me he has chest pain every day. It's described as sharp. He feels that he has had this same pain for the past year without significant change. It does radiate up into his neck at times and he feels diaphoretic. He has not taken any nitroglycerin. He has chronic dyspnea at rest. He does admit to apneic episodes as well as snoring. He denies orthopnea. He denies  significant LE edema. He denies syncope.   Studies/Reports Reviewed Today:  Carotid US 11/17/14 R < 40%; L 80-99%  Echo 09/27/14 - Mild LVH. EF 55%. Wall motion was normal. Grade 1 diastolic dysfunction  - Left atrium: The atrium was mildly dilated. - Right ventricle: The cavity size was normal. Systolic function was normal.  Myoview 11/2009 IMPRESSION: Large fixed defect involving the lateral wall extending into the anterior and inferior walls as described. Ejection fraction: 46%. No stress induced ischemia.  Cardiac Cath 08/2009 LM: 30% LAD: prox 80% LCx: Mid stent 80% ISR, Prox OM1 99% (large thrombus burden), OM2 80% RCA: Mid 80% and 95% EF: 60% mild AL hypokinesis PCI: POBA to OM1   Past Medical History  Diagnosis Date  . Hypertension   . CHF (congestive heart failure)   . Coronary artery disease   . CVA (cerebral infarction)   . High cholesterol   . Peripheral vascular disease   . Myocardial infarction ~ 2011  . Stroke ~ 2009    "left eye; totally blind there"   . Blind left eye ~ 2009  . DVT (deep venous thrombosis)   . Carotid artery occlusion     Past Surgical History  Procedure Laterality Date  . Cardiac catheterization    . Coronary artery bypass graft  ?2011  . Peripheral arterial stent graft  2000's    BLE  .  Back surgery      1998-99 had disc removed from back  . Appendectomy       Current Outpatient Prescriptions  Medication Sig Dispense Refill  . amLODipine (NORVASC) 10 MG tablet Take 10 mg by mouth daily.    [name] aspirin EC 81 MG tablet Take 1 tablet (81 mg total) by mouth daily. 30 tablet 3  . atorvastatin (LIPITOR) 10 MG tablet Take 10 mg by mouth daily.    [name] atorvastatin (LIPITOR) 10 MG tablet Take 10 mg by mouth daily.    . hydrALAZINE (APRESOLINE) 50 MG tablet Take 1 tablet (50 mg total) by mouth 2 (two) times daily. 60 tablet 3  . lisinopril (PRINIVIL,ZESTRIL) 10 MG tablet Take 2 tablets (20 mg total) by mouth daily. 60 tablet 3   . Multiple Vitamin (MULTIVITAMIN WITH MINERALS) TABS tablet Take 1 tablet by mouth daily.    . traMADol (ULTRAM) 50 MG tablet Take 1 tablet (50 mg total) by mouth every 6 (six) hours as needed. For pain 60 tablet 0   No current facility-administered medications for this visit.    Allergies:   Review of patient's allergies indicates no known allergies.    Social History:  The patient  reports that he has quit smoking. His smoking use included Cigarettes. He started smoking about 7 weeks ago. He has a 5.76 pack-year smoking history. He has never used smokeless tobacco. He reports that he drinks about 1.8 oz of alcohol per week. He reports that he uses illicit drugs ("Crack" cocaine).   Family History:  The patient's family history includes Diabetes in his sister; Heart disease in his brother and sister.    ROS:   Please see the history of present illness.   Review of Systems  Cardiovascular: Positive for leg swelling.  Respiratory: Positive for cough and snoring.   Musculoskeletal: Positive for muscle weakness.  All other systems reviewed and are negative.    PHYSICAL EXAM: VS:  BP 130/70 mmHg  Pulse 74  Ht 6' (1.829 m)  Wt 214 lb (97.07 kg)  BMI 29.02 kg/m2    Wt Readings from Last 3 Encounters:  12/15/14 214 lb (97.07 kg)  12/07/14 213 lb (96.616 kg)  11/17/14 221 lb (100.245 kg)     GEN: chronically ill appearing male in a wheelchair, speech is difficult to understand at times HEENT: normalextremely poor dentition Neck: no JVD, no masses Cardiac:  Normal S1/S2, RRR; no murmur ,  no rubs or gallops, trace edema  Respiratory:  Decreased breath sounds with bibasilar faint crackles, no wheezes GI: soft, nontender, nondistended, + BS MS: no deformity or atrophy Skin: warm and dry  Neuro:  CNs II-XII intact, Strength and sensation are intact Psych: Normal affect   EKG:  EKG is ordered today.  It demonstrates:   NSR, HR 74, normal axis, inf Q waves, NSSTTW changes     Recent Labs: 09/28/2014: ALT 23; Magnesium 2.0; TSH 2.730 12/07/2014: BUN 12; Creatinine 1.37*; Hemoglobin 14.6; Platelets 223; Potassium 4.0; Sodium 141     ASSESSMENT AND PLAN:  Pre-operative cardiovascular examination He needs Carotid Endarterectomy. He has not had an assessment for ischemia since 2011.  He had a recent visit to the ED for chest pain.  He will need risk stratification.  Will arrange [employer].  Given his comorbid illnesses, he would be moderate risk for any procedure regardless of the results of his stress test. As long as his Myoview results are not high risk, he will require  no further cardiac workup prior to his non-cardiac procedure.  Coronary artery disease  Has some atypical chest pain. Proceed with Myoview as noted. He is not on beta blocker for unclear reasons. As he needs surgery soon, I will not start beta blocker therapy at this time. Continue amlodipine, statin, ACE inhibitor  Carotid stenosis, left Follow-up with vascular surgery as planned.  Paroxysmal supraventricular tachycardia No apparent recurrence.  Peripheral vascular disease Follow-up with vascular surgery as planned.  HLD (hyperlipidemia) Continue statin.  Essential hypertension  Controlled.  Snoring He has symptoms that sound consistent with sleep apnea. We'll need to consider proceeding with sleep testing at some point in the near future.   Current medicines are reviewed at length with the patient today.  The patient does not have concerns regarding medicines.  The following changes have been made:  no change   Labs/ tests ordered today include:  Orders Placed This Encounter  Procedures  . Myocardial Perfusion Imaging  . EKG 12-Lead    Disposition:   FU with Dr. [name] or me 4-6 weeks.   Signed, Versie Starks, MHS 12/15/2014 12:33 PM    [facility] [address], [place], [place]  [address] Phone: [phone]; Fax: [phone]

## 2014-12-15 NOTE — Patient Instructions (Signed)
Medication Instructions:  Your physician recommends that you continue on your current medications as directed. Please refer to the Current Medication list given to you today.  Labwork: NONE  Testing/Procedures: LEXISCAN MYOVIEW; DX SURGERY CLEARANCE, CAD, CHEST PAIN  Follow-Up: 4-6 WEEKS WITH DR. CRENSHAW  Any Other Special Instructions Will Be Listed Below (If Applicable).

## 2014-12-17 ENCOUNTER — Encounter (HOSPITAL_COMMUNITY): Payer: Self-pay

## 2014-12-17 ENCOUNTER — Encounter (HOSPITAL_COMMUNITY)
Admission: RE | Admit: 2014-12-17 | Discharge: 2014-12-17 | Disposition: A | Discharge status: Home / Self Care | Payer: Medicare Other | Source: Ambulatory Visit | Attending: Vascular Surgery | Admitting: Vascular Surgery

## 2014-12-17 DIAGNOSIS — I6522 Occlusion and stenosis of left carotid artery: Secondary | ICD-10-CM | POA: Diagnosis not present

## 2014-12-17 DIAGNOSIS — Z01812 Encounter for preprocedural laboratory examination: Secondary | ICD-10-CM | POA: Diagnosis present

## 2014-12-17 HISTORY — DX: Reserved for inherently not codable concepts without codable children: IMO0001

## 2014-12-17 LAB — APTT: aPTT: 31 seconds (ref 24–37)

## 2014-12-17 LAB — URINALYSIS, ROUTINE W REFLEX MICROSCOPIC
Glucose, UA: NEGATIVE mg/dL
Hgb urine dipstick: NEGATIVE
Ketones, ur: 15 mg/dL — AB
Leukocytes, UA: NEGATIVE
Nitrite: NEGATIVE
Protein, ur: NEGATIVE mg/dL
Specific Gravity, Urine: 1.018 (ref 1.005–1.030)
Urobilinogen, UA: 1 mg/dL (ref 0.0–1.0)
pH: 5 (ref 5.0–8.0)

## 2014-12-17 LAB — CBC
HCT: 47.5 % (ref 39.0–52.0)
Hemoglobin: 16.3 g/dL (ref 13.0–17.0)
MCH: 27.3 pg (ref 26.0–34.0)
MCHC: 34.3 g/dL (ref 30.0–36.0)
MCV: 79.6 fL (ref 78.0–100.0)
Platelets: 220 10*3/uL (ref 150–400)
RBC: 5.97 MIL/uL — ABNORMAL HIGH (ref 4.22–5.81)
RDW: 15.1 % (ref 11.5–15.5)
WBC: 7.1 10*3/uL (ref 4.0–10.5)

## 2014-12-17 LAB — COMPREHENSIVE METABOLIC PANEL
ALT: 26 U/L (ref 0–53)
AST: 37 U/L (ref 0–37)
Albumin: 4.3 g/dL (ref 3.5–5.2)
Alkaline Phosphatase: 85 U/L (ref 39–117)
Anion gap: 15 (ref 5–15)
BUN: 11 mg/dL (ref 6–23)
CO2: 17 mmol/L — ABNORMAL LOW (ref 19–32)
Calcium: 10 mg/dL (ref 8.4–10.5)
Chloride: 106 mmol/L (ref 96–112)
Creatinine, Ser: 1.43 mg/dL — ABNORMAL HIGH (ref 0.50–1.35)
GFR calc Af Amer: 59 mL/min — ABNORMAL LOW (ref >90)
GFR calc non Af Amer: 51 mL/min — ABNORMAL LOW (ref >90)
Glucose, Bld: 96 mg/dL (ref 70–99)
Potassium: 3.8 mmol/L (ref 3.5–5.1)
Sodium: 138 mmol/L (ref 135–145)
Total Bilirubin: 0.8 mg/dL (ref 0.3–1.2)
Total Protein: 8.9 g/dL — ABNORMAL HIGH (ref 6.0–8.3)

## 2014-12-17 LAB — TYPE AND SCREEN
ABO/RH(D): A POS
Antibody Screen: NEGATIVE

## 2014-12-17 LAB — SURGICAL PCR SCREEN
MRSA, PCR: NEGATIVE
Staphylococcus aureus: NEGATIVE

## 2014-12-17 LAB — PROTIME-INR
INR: 0.96 (ref 0.00–1.49)
Prothrombin Time: 12.9 seconds (ref 11.6–15.2)

## 2014-12-17 NOTE — Pre-Procedure Instructions (Addendum)
[name]  12/17/2014   Your procedure is scheduled on:  12/28/14  Report to [facility] short stay admitting at 530 AM.  Call this number if you have problems the morning of surgery: [phone]   Remember:   Do not eat food or drink liquids after midnight.   Take these medicines the morning of surgery with A SIP OF WATER: amlodipine, hydralazine, pain med if needed     STOP all herbel meds, nsaids (aleve,naproxen,advil,ibuprofen) starting 12/23/14 including vitamins                  ,aspirin per dr   Lazaro Arms not wear jewelry, make-up or nail polish.  Do not wear lotions, powders, or perfumes. You may wear deodorant.  Do not shave 48 hours prior to surgery. Men may shave face and neck.  Do not bring valuables to the hospital.  [facility] is not responsible                  for any belongings or valuables.               Contacts, dentures or bridgework may not be worn into surgery.  Leave suitcase in the car. After surgery it may be brought to your room.  For patients admitted to the hospital, discharge time is determined by your                treatment team.               Patients discharged the [facility] surgery will not be allowed to drive  home.  Name and phone number of your driver:  Special Instructions: [facility] - Preparing for Surgery  Before surgery, you can play an important role.  Because skin is not sterile, your skin needs to be as free of germs as possible.  You can reduce the number of germs on you skin by washing with CHG (chlorahexidine gluconate) soap before surgery.  CHG is an antiseptic cleaner which kills germs and bonds with the skin to continue killing germs even after washing.  Please DO NOT use if you have an allergy to CHG or antibacterial soaps.  If your skin becomes reddened/irritated stop using the CHG and inform your nurse when you arrive at Short Stay.  Do not shave (including legs and underarms) for at least 48 hours prior to the first CHG shower.  You  may shave your face.  Please follow these instructions carefully:   1.  Shower with CHG Soap the night before surgery and the morning of Surgery.  2.  If you choose to wash your hair, wash your hair first as usual with your normal shampoo.  3.  After you shampoo, rinse your hair and body thoroughly to remove the Shampoo.  4.  Use CHG as you would any other liquid soap.  You can apply chg directly  to the skin and wash gently with scrungie or a clean washcloth.  5.  Apply the CHG Soap to your body ONLY FROM THE NECK DOWN.  Do not use on open wounds or open sores.  Avoid contact with your eyes ears, mouth and genitals (private parts).  Wash genitals (private parts)       with your normal soap.  6.  Wash thoroughly, paying special attention to the area where your surgery will be performed.  7.  Thoroughly rinse your body with warm water from the neck down.  8.  DO NOT shower/wash with  your normal soap after using and rinsing off the CHG Soap.  9.  Pat yourself dry with a clean towel.            10.  Wear clean pajamas.            11.  Place clean sheets on your bed the night of your first shower and do not sleep with pets.  [facility]  Do not apply any lotions/deodorants the morning of surgery.  Please wear clean clothes to the hospital/surgery center.  Special Instructions:    Please read over the following fact sheets that you were given: Pain Booklet, Coughing and Deep Breathing, Blood Transfusion Information, MRSA Information and Surgical Site Infection Prevention

## 2014-12-17 NOTE — Progress Notes (Signed)
Stress test to be done 12/22/14

## 2014-12-20 NOTE — Progress Notes (Addendum)
Anesthesia Note: Patient is a 64 year old male scheduled for left CEA on 12/28/14 by Dr. Oneida Alar.  History includes former smoker, HTN, NSTEMI '06 s/p DES CX marginal and AV branch, STEMI s/p PTCA OM1 08/2009 followed by CABG X 4 08/23/09 (LIMA to LAD, SVG to OM1 and OM2, SVG to RCA), CHF (EF 35-40% 11/2006, 55% 09/2014), PVD s/p left Fem-AT BPG '10, DVT, chronic left foot drop, 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 He was last admitted on [place] on 09/26/14 for uncontrolled hypertension due to medication non-compliance and LLE pain and weakness. During his hospitalization he had an episode of paroxysmal SVT and a one second nocturnal pause. He was admitted by the Hospitalist Service with neurology and cardiology consulting. He was seen by cardiolgoist Dr. Stanford Breed for PSVT which he felt was likely ST with non-conducted PACs. Echo showed normal EF and wall motion and no further cardiac work-up was felt indicated at that time. He also did not feel b-blocker therapy was indicated. He last saw Richardson Dopp, PA-C with cardiology on 12/15/14 with preoperative nuclear stress test recommended.   PCP is listed as Selina Cooley, FNP.   Meds include amlodipine, ASA, Lipitor, hydralazine, lisinopril, tramadol.  12/15/14 EKG: NSR, possible inferior infarct (age undetermined).   He is scheduled for a nuclear stress test on 12/22/14. Per [employer] note, "Given his comorbid illnesses, he would be moderate risk for any procedure regardless of the results of his stress test. As long as his Myoview results are not high risk, he will require no further cardiac workup prior to his non-cardiac procedure."  09/27/14 Echo: - Left ventricle: The cavity size was normal. Wall thickness was increased in a pattern of mild LVH. The estimated ejection fraction was 55%. Wall motion was normal; there were no regional wall motion abnormalities. Doppler parameters are  consistent with abnormal left ventricular relaxation (grade 1 diastolicdysfunction). - Left atrium: The atrium was mildly dilated. - Right ventricle: The cavity size was normal. Systolic function was normal.  Last cath seen is pre-CABG, done on 08/17/09. See Notes tab for full details.  11/17/14 Carotid duplex: < 40% RICA stenosis, 123456 LICA stenosis, bilateral ECA stenosis, bilateral vertebral artery flow is antegrade.  12/07/14 CXR:  1. Linear subsegmental atelectasis along both hemidiaphragms. 2. Moderate enlargement of the cardiopericardial silhouette, without edema. Prior CABG. 3. Atherosclerosis.  Preoperative labs noted.   Plans to proceed as scheduled will depend on upcoming nuclear stress test results.  Chart will be left for follow-up.  George Hugh [facility] Short Stay Center/Anesthesiology Phone [phone] 12/20/2014 11:40 AM  Addendum: Patient missed or rescheduled the last two dates of his stress test, so it was not done until today.  12/27/14 Nuclear stress test results: Overall Impression: Intermediate risk stress nuclear study with a large, severe, partially reversible anterior lateral defect consistent with prior infarct and very mild peri-infarct ischemia. Study intermediate risk due to reduced LV function. LV Ejection Fraction: 42%. LV Wall Motion: Lateral akinesis.  I communicated with Dr. Oneida Alar that stress was intermediate risk.  He reports that Dr. Stanford Breed gave him the okay for patient to proceed with planned surgery.  George Hugh [facility] Short Stay Center/Anesthesiology Phone [phone] 12/27/2014 6:17 PM

## 2014-12-22 ENCOUNTER — Encounter (HOSPITAL_COMMUNITY): Payer: Medicare Other

## 2014-12-27 ENCOUNTER — Encounter: Payer: Self-pay | Admitting: Cardiology

## 2014-12-27 ENCOUNTER — Ambulatory Visit (HOSPITAL_BASED_OUTPATIENT_CLINIC_OR_DEPARTMENT_OTHER): Payer: Medicare Other | Admitting: Radiology

## 2014-12-27 DIAGNOSIS — R9431 Abnormal electrocardiogram [ECG] [EKG]: Secondary | ICD-10-CM

## 2014-12-27 DIAGNOSIS — R079 Chest pain, unspecified: Secondary | ICD-10-CM | POA: Insufficient documentation

## 2014-12-27 DIAGNOSIS — I6522 Occlusion and stenosis of left carotid artery: Secondary | ICD-10-CM

## 2014-12-27 DIAGNOSIS — I10 Essential (primary) hypertension: Secondary | ICD-10-CM | POA: Insufficient documentation

## 2014-12-27 DIAGNOSIS — Z8673 Personal history of transient ischemic attack (TIA), and cerebral infarction without residual deficits: Secondary | ICD-10-CM

## 2014-12-27 DIAGNOSIS — Z01810 Encounter for preprocedural cardiovascular examination: Secondary | ICD-10-CM | POA: Diagnosis not present

## 2014-12-27 DIAGNOSIS — R0602 Shortness of breath: Secondary | ICD-10-CM | POA: Insufficient documentation

## 2014-12-27 DIAGNOSIS — I2510 Atherosclerotic heart disease of native coronary artery without angina pectoris: Secondary | ICD-10-CM | POA: Insufficient documentation

## 2014-12-27 DIAGNOSIS — Z951 Presence of aortocoronary bypass graft: Secondary | ICD-10-CM | POA: Insufficient documentation

## 2014-12-27 MED ORDER — SODIUM CHLORIDE 0.9 % IV SOLN: INTRAVENOUS | Status: DC

## 2014-12-27 MED ORDER — CHLORHEXIDINE GLUCONATE CLOTH 2 % EX PADS: 6.0000 | MEDICATED_PAD | Freq: Once | CUTANEOUS | Status: DC

## 2014-12-27 MED ORDER — DEXTROSE 5 % IV SOLN
1.5000 g | INTRAVENOUS | Status: AC
  Administered 2014-12-28: 1.5 g via INTRAVENOUS
  Filled 2014-12-27: qty 1.5

## 2014-12-27 MED ORDER — REGADENOSON 0.4 MG/5ML IV SOLN
0.4000 mg | Freq: Once | INTRAVENOUS | Status: AC
  Administered 2014-12-27: 0.4 mg via INTRAVENOUS

## 2014-12-27 MED ORDER — TECHNETIUM TC 99M SESTAMIBI GENERIC - CARDIOLITE
33.0000 | Freq: Once | INTRAVENOUS | Status: AC | PRN
  Administered 2014-12-27: 33 via INTRAVENOUS

## 2014-12-27 MED ORDER — TECHNETIUM TC 99M SESTAMIBI GENERIC - CARDIOLITE
10.0000 | Freq: Once | INTRAVENOUS | Status: AC | PRN
  Administered 2014-12-27: 10 via INTRAVENOUS

## 2014-12-27 NOTE — Progress Notes (Signed)
[facility] [address] [place], [place] [address] [phone]    Cardiology Nuclear Med Study  [name] is a 64 y.o. male     MRN : [id number]     DOB: 1951-05-02  Procedure Date: 12/27/2014  Nuclear Med Background Indication for Stress Test:  Evaluation for Ischemia, Surgical Clearance:LICA Stenosis with Dr, Oneida Alar 12/28/14, Stent Patency and Abnormal EKG History:  '11 MPI: EF: 46% Scar CAD Stent CABG Cardiac Risk Factors: Carotid Disease, CVA and Hypertension  Symptoms:  Chest Pain and SOB   Nuclear Pre-Procedure Caffeine/Decaff Intake:  None NPO After: 9:00pm   Lungs:  clear O2 Sat: 95% on room air. IV 0.9% NS with Angio Cath:  22g  IV Site: R Hand  IV Started by:  Matilde Haymaker, RN  Chest Size (in):  44 Cup Size: n/a  Height: 6' (1.829 m)  Weight:  215 lb (97.523 kg)  BMI:  Body mass index is 29.15 kg/(m^2). Tech Comments:  meds taken    Nuclear Med Study 1 or 2 day study: 1 day  Stress Test Type:  Lexiscan  Reading MD: n/a  Order Authorizing Provider:  Queen Blossom  Resting Radionuclide: Technetium 71m Sestamibi  Resting Radionuclide Dose: 11.0 mCi   Stress Radionuclide:  Technetium 49m Sestamibi  Stress Radionuclide Dose: 33.0 mCi           Stress Protocol Rest HR: 67 Stress HR: 94  Rest BP:103/63 Stress BP: 128/74  Exercise Time (min): n/a METS: n/a   Predicted Max HR: 157 bpm % Max HR: 59.87 bpm Rate Pressure Product: 12032   Dose of Adenosine (mg):  n/a Dose of Lexiscan: 0.4 mg  Dose of Atropine (mg): n/a Dose of Dobutamine: n/a mcg/kg/min (at max HR)  Stress Test Technologist: Perrin Maltese, EMT-P  Nuclear Technologist:  Earl Many, CNMT     Rest Procedure:  Myocardial perfusion imaging was performed at rest 45 minutes following the intravenous administration of Technetium 61m Sestamibi. Rest ECG: NSR - Normal EKG  Stress Procedure:  The patient received IV Lexiscan 0.4 mg over 15-seconds.   Technetium 76m Sestamibi injected at 30-seconds. This patient had sob with the Lexiscan injection. Quantitative spect images were obtained after a 45 minute delay. Stress ECG: No significant ST segment change suggestive of ischemia.  QPS Raw Data Images:  Acquisition technically good; normal left ventricular size. Stress Images:  There is decreased uptake in the lateral wall and basal anterior wall. Rest Images:  There is decreased uptake in the lateral wall, slightly less prominent compared to the stress images. Subtraction (SDS):  These findings are consistent with prior infarct and mild peri-infarct ischemia. Transient Ischemic Dilatation (Normal <1.22):  1.13 Lung/Heart Ratio (Normal <0.45):  0.31  Quantitative Gated Spect Images QGS EDV:  95 ml QGS ESV:  56 ml  Impression Exercise Capacity:  Lexiscan with no exercise. BP Response:  Normal blood pressure response. Clinical Symptoms:  There is dyspnea. ECG Impression:  No significant ST segment change suggestive of ischemia. Comparison with Prior Nuclear Study: Compared to 11/25/09, mild ischemia new.  Overall Impression:  Intermediate risk stress nuclear study with a large, severe, partially reversible anterior lateral defect consistent with prior infarct and very mild peri-infarct ischemia. Study intermediate risk due to reduced LV function.  LV Ejection Fraction: 42%.  LV Wall Motion:  Lateral akinesis.  [name]

## 2014-12-28 ENCOUNTER — Inpatient Hospital Stay (HOSPITAL_COMMUNITY): Payer: Medicare Other | Admitting: Vascular Surgery

## 2014-12-28 ENCOUNTER — Inpatient Hospital Stay (HOSPITAL_COMMUNITY)
Admission: RE | Admit: 2014-12-28 | Discharge: 2014-12-29 | DRG: 039 | Disposition: A | Discharge status: Home / Self Care | Payer: Medicare Other | Source: Ambulatory Visit | Attending: Vascular Surgery | Admitting: Vascular Surgery

## 2014-12-28 ENCOUNTER — Encounter (HOSPITAL_COMMUNITY): Payer: Self-pay

## 2014-12-28 ENCOUNTER — Encounter (HOSPITAL_COMMUNITY)
Admission: RE | Disposition: A | Discharge status: Home / Self Care | Payer: Self-pay | Source: Ambulatory Visit | Attending: Vascular Surgery

## 2014-12-28 ENCOUNTER — Inpatient Hospital Stay (HOSPITAL_COMMUNITY): Payer: Medicare Other | Admitting: Anesthesiology

## 2014-12-28 DIAGNOSIS — Z8249 Family history of ischemic heart disease and other diseases of the circulatory system: Secondary | ICD-10-CM | POA: Diagnosis not present

## 2014-12-28 DIAGNOSIS — Z87891 Personal history of nicotine dependence: Secondary | ICD-10-CM

## 2014-12-28 DIAGNOSIS — M21372 Foot drop, left foot: Secondary | ICD-10-CM | POA: Diagnosis present

## 2014-12-28 DIAGNOSIS — Z951 Presence of aortocoronary bypass graft: Secondary | ICD-10-CM

## 2014-12-28 DIAGNOSIS — Z86718 Personal history of other venous thrombosis and embolism: Secondary | ICD-10-CM | POA: Diagnosis not present

## 2014-12-28 DIAGNOSIS — I10 Essential (primary) hypertension: Secondary | ICD-10-CM | POA: Diagnosis present

## 2014-12-28 DIAGNOSIS — I6522 Occlusion and stenosis of left carotid artery: Secondary | ICD-10-CM | POA: Diagnosis present

## 2014-12-28 DIAGNOSIS — Z8673 Personal history of transient ischemic attack (TIA), and cerebral infarction without residual deficits: Secondary | ICD-10-CM

## 2014-12-28 DIAGNOSIS — Z7982 Long term (current) use of aspirin: Secondary | ICD-10-CM

## 2014-12-28 DIAGNOSIS — I252 Old myocardial infarction: Secondary | ICD-10-CM

## 2014-12-28 DIAGNOSIS — Z79899 Other long term (current) drug therapy: Secondary | ICD-10-CM

## 2014-12-28 DIAGNOSIS — E780 Pure hypercholesterolemia: Secondary | ICD-10-CM | POA: Diagnosis present

## 2014-12-28 DIAGNOSIS — I2510 Atherosclerotic heart disease of native coronary artery without angina pectoris: Secondary | ICD-10-CM | POA: Diagnosis present

## 2014-12-28 DIAGNOSIS — Z833 Family history of diabetes mellitus: Secondary | ICD-10-CM | POA: Diagnosis not present

## 2014-12-28 HISTORY — DX: Personal history of other medical treatment: Z92.89

## 2014-12-28 HISTORY — PX: CAROTID ENDARTERECTOMY: SUR193

## 2014-12-28 HISTORY — PX: ENDARTERECTOMY: SHX5162

## 2014-12-28 LAB — CREATININE, SERUM
Creatinine, Ser: 1.51 mg/dL — ABNORMAL HIGH (ref 0.50–1.35)
GFR calc Af Amer: 55 mL/min — ABNORMAL LOW (ref >90)
GFR calc non Af Amer: 47 mL/min — ABNORMAL LOW (ref >90)

## 2014-12-28 LAB — CBC
HCT: 40.5 % (ref 39.0–52.0)
Hemoglobin: 13.8 g/dL (ref 13.0–17.0)
MCH: 27.1 pg (ref 26.0–34.0)
MCHC: 34.1 g/dL (ref 30.0–36.0)
MCV: 79.4 fL (ref 78.0–100.0)
Platelets: 218 10*3/uL (ref 150–400)
RBC: 5.1 MIL/uL (ref 4.22–5.81)
RDW: 15 % (ref 11.5–15.5)
WBC: 8.6 10*3/uL (ref 4.0–10.5)

## 2014-12-28 LAB — GLUCOSE, CAPILLARY: Glucose-Capillary: 116 mg/dL — ABNORMAL HIGH (ref 70–99)

## 2014-12-28 SURGERY — ENDARTERECTOMY, CAROTID
Anesthesia: General | Site: Neck | Laterality: Left

## 2014-12-28 MED ORDER — VECURONIUM BROMIDE 10 MG IV SOLR
INTRAVENOUS | Status: AC
Start: 2014-12-28 — End: 2014-12-28
  Filled 2014-12-28: qty 10

## 2014-12-28 MED ORDER — LABETALOL HCL 5 MG/ML IV SOLN
INTRAVENOUS | Status: AC
  Filled 2014-12-28: qty 4

## 2014-12-28 MED ORDER — PROTAMINE SULFATE 10 MG/ML IV SOLN
INTRAVENOUS | Status: DC | PRN
  Administered 2014-12-28 (×4): 10 mg via INTRAVENOUS

## 2014-12-28 MED ORDER — METOPROLOL TARTRATE 1 MG/ML IV SOLN: 2.0000 mg | INTRAVENOUS | Status: DC | PRN

## 2014-12-28 MED ORDER — NEOSTIGMINE METHYLSULFATE 10 MG/10ML IV SOLN
INTRAVENOUS | Status: DC | PRN
  Administered 2014-12-28: 4 mg via INTRAVENOUS

## 2014-12-28 MED ORDER — POTASSIUM CHLORIDE CRYS ER 20 MEQ PO TBCR: 20.0000 meq | EXTENDED_RELEASE_TABLET | Freq: Every day | ORAL | Status: DC | PRN

## 2014-12-28 MED ORDER — ROCURONIUM BROMIDE 100 MG/10ML IV SOLN
INTRAVENOUS | Status: DC | PRN
  Administered 2014-12-28: 40 mg via INTRAVENOUS

## 2014-12-28 MED ORDER — HYDRALAZINE HCL 50 MG PO TABS
50.0000 mg | ORAL_TABLET | Freq: Two times a day (BID) | ORAL | Status: DC
  Administered 2014-12-28: 50 mg via ORAL
  Filled 2014-12-28 (×3): qty 1

## 2014-12-28 MED ORDER — GLYCOPYRROLATE 0.2 MG/ML IJ SOLN
INTRAMUSCULAR | Status: DC | PRN
  Administered 2014-12-28: .6 mg via INTRAVENOUS

## 2014-12-28 MED ORDER — PHENYLEPHRINE 40 MCG/ML (10ML) SYRINGE FOR IV PUSH (FOR BLOOD PRESSURE SUPPORT)
PREFILLED_SYRINGE | INTRAVENOUS | Status: AC
  Filled 2014-12-28: qty 10

## 2014-12-28 MED ORDER — PANTOPRAZOLE SODIUM 40 MG PO TBEC
40.0000 mg | DELAYED_RELEASE_TABLET | Freq: Every day | ORAL | Status: DC
  Administered 2014-12-29: 40 mg via ORAL
  Filled 2014-12-28: qty 1

## 2014-12-28 MED ORDER — ALUM & MAG HYDROXIDE-SIMETH 200-200-20 MG/5ML PO SUSP: 15.0000 mL | ORAL | Status: DC | PRN

## 2014-12-28 MED ORDER — PHENOL 1.4 % MT LIQD: 1.0000 | OROMUCOSAL | Status: DC | PRN

## 2014-12-28 MED ORDER — OXYCODONE HCL 5 MG PO TABS: 5.0000 mg | ORAL_TABLET | ORAL | Status: DC | PRN

## 2014-12-28 MED ORDER — THROMBIN 5000 UNITS EX SOLR
CUTANEOUS | Status: AC
  Filled 2014-12-28: qty 5000

## 2014-12-28 MED ORDER — LABETALOL HCL 5 MG/ML IV SOLN
INTRAVENOUS | Status: DC | PRN
  Administered 2014-12-28 (×2): 10 mg via INTRAVENOUS

## 2014-12-28 MED ORDER — ATORVASTATIN CALCIUM 10 MG PO TABS
10.0000 mg | ORAL_TABLET | Freq: Every day | ORAL | Status: DC
  Administered 2014-12-29: 10 mg via ORAL
  Filled 2014-12-28 (×2): qty 1

## 2014-12-28 MED ORDER — PHENYLEPHRINE HCL 10 MG/ML IJ SOLN
10.0000 mg | INTRAVENOUS | Status: DC | PRN
  Administered 2014-12-28: 40 ug/min via INTRAVENOUS

## 2014-12-28 MED ORDER — LIDOCAINE HCL (PF) 1 % IJ SOLN
INTRAMUSCULAR | Status: AC
  Filled 2014-12-28: qty 30

## 2014-12-28 MED ORDER — ADULT MULTIVITAMIN W/MINERALS CH
1.0000 | ORAL_TABLET | Freq: Every day | ORAL | Status: DC
  Administered 2014-12-29: 1 via ORAL
  Filled 2014-12-28 (×2): qty 1

## 2014-12-28 MED ORDER — LACTATED RINGERS IV SOLN
INTRAVENOUS | Status: DC | PRN
  Administered 2014-12-28 (×2): via INTRAVENOUS

## 2014-12-28 MED ORDER — FENTANYL CITRATE (PF) 250 MCG/5ML IJ SOLN
INTRAMUSCULAR | Status: AC
  Filled 2014-12-28: qty 5

## 2014-12-28 MED ORDER — PROPOFOL 10 MG/ML IV BOLUS
INTRAVENOUS | Status: DC | PRN
Start: 2014-12-28 — End: 2014-12-28
  Administered 2014-12-28: 90 mg via INTRAVENOUS

## 2014-12-28 MED ORDER — ONDANSETRON HCL 4 MG/2ML IJ SOLN: 4.0000 mg | Freq: Four times a day (QID) | INTRAMUSCULAR | Status: DC | PRN

## 2014-12-28 MED ORDER — DOCUSATE SODIUM 100 MG PO CAPS
100.0000 mg | ORAL_CAPSULE | Freq: Every day | ORAL | Status: DC
  Administered 2014-12-29: 100 mg via ORAL

## 2014-12-28 MED ORDER — SODIUM CHLORIDE 0.9 % IV SOLN
0.0125 ug/kg/min | INTRAVENOUS | Status: AC
  Administered 2014-12-28 (×2): 0.15 ug/kg/min via INTRAVENOUS
  Filled 2014-12-28: qty 2000

## 2014-12-28 MED ORDER — FENTANYL CITRATE (PF) 100 MCG/2ML IJ SOLN
INTRAMUSCULAR | Status: DC | PRN
  Administered 2014-12-28: 50 ug via INTRAVENOUS

## 2014-12-28 MED ORDER — PHENYLEPHRINE HCL 10 MG/ML IJ SOLN
INTRAMUSCULAR | Status: DC | PRN
  Administered 2014-12-28 (×2): 80 ug via INTRAVENOUS

## 2014-12-28 MED ORDER — GLYCOPYRROLATE 0.2 MG/ML IJ SOLN
INTRAMUSCULAR | Status: AC
  Filled 2014-12-28: qty 4

## 2014-12-28 MED ORDER — ACETAMINOPHEN 325 MG PO TABS: 325.0000 mg | ORAL_TABLET | ORAL | Status: DC | PRN

## 2014-12-28 MED ORDER — DEXTROSE 5 % IV SOLN
1.5000 g | Freq: Two times a day (BID) | INTRAVENOUS | Status: AC
  Administered 2014-12-28 – 2014-12-29 (×2): 1.5 g via INTRAVENOUS
  Filled 2014-12-28 (×2): qty 1.5

## 2014-12-28 MED ORDER — MORPHINE SULFATE 2 MG/ML IJ SOLN: 2.0000 mg | INTRAMUSCULAR | Status: DC | PRN

## 2014-12-28 MED ORDER — ENOXAPARIN SODIUM 30 MG/0.3ML ~~LOC~~ SOLN
30.0000 mg | SUBCUTANEOUS | Status: DC
  Filled 2014-12-28: qty 0.3

## 2014-12-28 MED ORDER — ASPIRIN EC 81 MG PO TBEC
81.0000 mg | DELAYED_RELEASE_TABLET | Freq: Every day | ORAL | Status: DC
  Administered 2014-12-29: 81 mg via ORAL
  Filled 2014-12-28 (×2): qty 1

## 2014-12-28 MED ORDER — AMLODIPINE BESYLATE 10 MG PO TABS
10.0000 mg | ORAL_TABLET | Freq: Every day | ORAL | Status: DC
  Administered 2014-12-29: 10 mg via ORAL
  Filled 2014-12-28 (×2): qty 1

## 2014-12-28 MED ORDER — LIDOCAINE HCL (CARDIAC) 20 MG/ML IV SOLN
INTRAVENOUS | Status: DC | PRN
  Administered 2014-12-28: 60 mg via INTRAVENOUS

## 2014-12-28 MED ORDER — HEPARIN SODIUM (PORCINE) 1000 UNIT/ML IJ SOLN
INTRAMUSCULAR | Status: DC | PRN
  Administered 2014-12-28: 10000 [IU] via INTRAVENOUS

## 2014-12-28 MED ORDER — LISINOPRIL 20 MG PO TABS
20.0000 mg | ORAL_TABLET | Freq: Every day | ORAL | Status: DC
  Administered 2014-12-29: 20 mg via ORAL
  Filled 2014-12-28 (×2): qty 1

## 2014-12-28 MED ORDER — SUCCINYLCHOLINE CHLORIDE 20 MG/ML IJ SOLN
INTRAMUSCULAR | Status: AC
  Filled 2014-12-28: qty 1

## 2014-12-28 MED ORDER — EPHEDRINE SULFATE 50 MG/ML IJ SOLN
INTRAMUSCULAR | Status: AC
  Filled 2014-12-28: qty 1

## 2014-12-28 MED ORDER — NEOSTIGMINE METHYLSULFATE 10 MG/10ML IV SOLN
INTRAVENOUS | Status: AC
  Filled 2014-12-28: qty 1

## 2014-12-28 MED ORDER — SODIUM CHLORIDE 0.9 % IV SOLN
0.0125 ug/kg/min | INTRAVENOUS | Status: DC
  Filled 2014-12-28: qty 2000

## 2014-12-28 MED ORDER — MIDAZOLAM HCL 2 MG/2ML IJ SOLN
INTRAMUSCULAR | Status: AC
  Filled 2014-12-28: qty 2

## 2014-12-28 MED ORDER — ACETAMINOPHEN 650 MG RE SUPP: 325.0000 mg | RECTAL | Status: DC | PRN

## 2014-12-28 MED ORDER — LIDOCAINE HCL (CARDIAC) 20 MG/ML IV SOLN
INTRAVENOUS | Status: AC
  Filled 2014-12-28: qty 5

## 2014-12-28 MED ORDER — PROPOFOL 10 MG/ML IV BOLUS
INTRAVENOUS | Status: AC
  Filled 2014-12-28: qty 20

## 2014-12-28 MED ORDER — OXYCODONE HCL 5 MG PO TABS: 5.0000 mg | ORAL_TABLET | Freq: Once | ORAL | Status: DC | PRN

## 2014-12-28 MED ORDER — SODIUM CHLORIDE 0.9 % IV SOLN: 500.0000 mL | Freq: Once | INTRAVENOUS | Status: AC | PRN

## 2014-12-28 MED ORDER — SODIUM CHLORIDE 0.9 % IR SOLN
Status: DC | PRN
  Administered 2014-12-28: 500 mL

## 2014-12-28 MED ORDER — STERILE WATER FOR INJECTION IJ SOLN
INTRAMUSCULAR | Status: AC
  Filled 2014-12-28: qty 20

## 2014-12-28 MED ORDER — 0.9 % SODIUM CHLORIDE (POUR BTL) OPTIME
TOPICAL | Status: DC | PRN
  Administered 2014-12-28: 2000 mL

## 2014-12-28 MED ORDER — HYDRALAZINE HCL 20 MG/ML IJ SOLN: 5.0000 mg | INTRAMUSCULAR | Status: DC | PRN

## 2014-12-28 MED ORDER — LIDOCAINE HCL (PF) 1 % IJ SOLN
INTRAMUSCULAR | Status: DC | PRN
  Administered 2014-12-28: 1 mL

## 2014-12-28 MED ORDER — LABETALOL HCL 5 MG/ML IV SOLN: 10.0000 mg | INTRAVENOUS | Status: DC | PRN

## 2014-12-28 MED ORDER — FENTANYL CITRATE (PF) 100 MCG/2ML IJ SOLN
25.0000 ug | INTRAMUSCULAR | Status: DC | PRN
  Administered 2014-12-28: 50 ug via INTRAVENOUS

## 2014-12-28 MED ORDER — INSULIN ASPART 100 UNIT/ML ~~LOC~~ SOLN: 0.0000 [IU] | Freq: Three times a day (TID) | SUBCUTANEOUS | Status: DC

## 2014-12-28 MED ORDER — ROCURONIUM BROMIDE 50 MG/5ML IV SOLN
INTRAVENOUS | Status: AC
  Filled 2014-12-28: qty 1

## 2014-12-28 MED ORDER — GUAIFENESIN-DM 100-10 MG/5ML PO SYRP: 15.0000 mL | ORAL_SOLUTION | ORAL | Status: DC | PRN

## 2014-12-28 MED ORDER — ONDANSETRON HCL 4 MG/2ML IJ SOLN
INTRAMUSCULAR | Status: AC
  Filled 2014-12-28: qty 2

## 2014-12-28 MED ORDER — PROTAMINE SULFATE 10 MG/ML IV SOLN
INTRAVENOUS | Status: AC
  Filled 2014-12-28: qty 5

## 2014-12-28 MED ORDER — FENTANYL CITRATE (PF) 100 MCG/2ML IJ SOLN
INTRAMUSCULAR | Status: AC
  Filled 2014-12-28: qty 2

## 2014-12-28 MED ORDER — OXYCODONE HCL 5 MG/5ML PO SOLN: 5.0000 mg | Freq: Once | ORAL | Status: DC | PRN

## 2014-12-28 MED ORDER — SODIUM CHLORIDE 0.45 % IV SOLN
INTRAVENOUS | Status: DC
  Administered 2014-12-28: 18:00:00 via INTRAVENOUS

## 2014-12-28 MED ORDER — ONDANSETRON HCL 4 MG/2ML IJ SOLN
INTRAMUSCULAR | Status: DC | PRN
  Administered 2014-12-28: 4 mg via INTRAVENOUS

## 2014-12-28 MED ORDER — MAGNESIUM SULFATE 2 GM/50ML IV SOLN: 2.0000 g | Freq: Every day | INTRAVENOUS | Status: DC | PRN

## 2014-12-28 MED ORDER — BISACODYL 10 MG RE SUPP: 10.0000 mg | Freq: Every day | RECTAL | Status: DC | PRN

## 2014-12-28 SURGICAL SUPPLY — 55 items
CANISTER SUCTION 2500CC (MISCELLANEOUS) ×2 IMPLANT
CANNULA VESSEL 3MM 2 BLNT TIP (CANNULA) ×4 IMPLANT
CATH ROBINSON RED A/P 18FR (CATHETERS) ×2 IMPLANT
CLIP TI MEDIUM 6 (CLIP) ×2 IMPLANT
CLIP TI WIDE RED SMALL 6 (CLIP) ×2 IMPLANT
CRADLE DONUT ADULT HEAD (MISCELLANEOUS) ×1 IMPLANT
DECANTER SPIKE VIAL GLASS SM (MISCELLANEOUS) IMPLANT
DRAIN HEMOVAC 1/8 X 5 (WOUND CARE) IMPLANT
ELECT REM PT RETURN 9FT ADLT (ELECTROSURGICAL) ×2
ELECTRODE REM PT RTRN 9FT ADLT (ELECTROSURGICAL) ×1 IMPLANT
EVACUATOR SILICONE 100CC (DRAIN) IMPLANT
GAUZE SPONGE 4X4 12PLY STRL (GAUZE/BANDAGES/DRESSINGS) ×1 IMPLANT
GEL ULTRASOUND 20GR AQUASONIC (MISCELLANEOUS) IMPLANT
GLOVE BIO SURGEON STRL SZ 6.5 (GLOVE) ×2 IMPLANT
GLOVE BIO SURGEON STRL SZ7.5 (GLOVE) ×3 IMPLANT
GLOVE BIO SURGEON STRL SZ8.5 (GLOVE) ×1 IMPLANT
GLOVE BIOGEL PI IND STRL 6.5 (GLOVE) ×2 IMPLANT
GLOVE BIOGEL PI IND STRL 7.0 (GLOVE) IMPLANT
GLOVE BIOGEL PI IND STRL 8 (GLOVE) IMPLANT
GLOVE BIOGEL PI IND STRL 8.5 (GLOVE) ×1 IMPLANT
GLOVE BIOGEL PI INDICATOR 6.5 (GLOVE) ×2
GLOVE BIOGEL PI INDICATOR 7.0 (GLOVE) ×2
GLOVE BIOGEL PI INDICATOR 8 (GLOVE) ×1
GLOVE BIOGEL PI INDICATOR 8.5 (GLOVE) ×1
GLOVE ECLIPSE 6.5 STRL STRAW (GLOVE) ×2 IMPLANT
GOWN STRL REUS W/ TWL LRG LVL3 (GOWN DISPOSABLE) ×5 IMPLANT
GOWN STRL REUS W/ TWL XL LVL3 (GOWN DISPOSABLE) IMPLANT
GOWN STRL REUS W/TWL LRG LVL3 (GOWN DISPOSABLE) ×10
GOWN STRL REUS W/TWL XL LVL3 (GOWN DISPOSABLE) ×2
KIT BASIN OR (CUSTOM PROCEDURE TRAY) ×2 IMPLANT
KIT ROOM TURNOVER OR (KITS) ×2 IMPLANT
LIQUID BAND (GAUZE/BANDAGES/DRESSINGS) ×2 IMPLANT
LOOP VESSEL MINI RED (MISCELLANEOUS) IMPLANT
NEEDLE HYPO 25GX1X1/2 BEV (NEEDLE) IMPLANT
NS IRRIG 1000ML POUR BTL (IV SOLUTION) ×4 IMPLANT
PACK CAROTID (CUSTOM PROCEDURE TRAY) ×2 IMPLANT
PAD ARMBOARD 7.5X6 YLW CONV (MISCELLANEOUS) ×4 IMPLANT
PATCH HEMASHIELD 8X75 (Vascular Products) ×2 IMPLANT
SHUNT CAROTID BYPASS 10 (VASCULAR PRODUCTS) ×1 IMPLANT
SHUNT CAROTID BYPASS 12FRX15.5 (VASCULAR PRODUCTS) IMPLANT
SPONGE INTESTINAL PEANUT (DISPOSABLE) ×1 IMPLANT
SPONGE SURGIFOAM ABS GEL 100 (HEMOSTASIS) IMPLANT
SUT ETHILON 3 0 PS 1 (SUTURE) IMPLANT
SUT PROLENE 6 0 CC (SUTURE) ×4 IMPLANT
SUT PROLENE 7 0 BV 1 (SUTURE) IMPLANT
SUT PROLENE 7 0 BV1 MDA (SUTURE) ×2 IMPLANT
SUT SILK 3 0 (SUTURE) ×2
SUT SILK 3 0 TIES 17X18 (SUTURE)
SUT SILK 3-0 18XBRD TIE 12 (SUTURE) IMPLANT
SUT SILK 3-0 18XBRD TIE BLK (SUTURE) IMPLANT
SUT VIC AB 3-0 SH 27 (SUTURE) ×2
SUT VIC AB 3-0 SH 27X BRD (SUTURE) ×1 IMPLANT
SUT VICRYL 4-0 PS2 18IN ABS (SUTURE) ×2 IMPLANT
SYR CONTROL 10ML LL (SYRINGE) IMPLANT
WATER STERILE IRR 1000ML POUR (IV SOLUTION) ×2 IMPLANT

## 2014-12-28 NOTE — Anesthesia Postprocedure Evaluation (Signed)
  Anesthesia Post-op Note  Patient: [name]  Procedure(s) Performed: Procedure(s): LEFT CAROTID ENDARTERECTOMY WITH PATCH ANGIOPLASTY (Left)  Patient Location: PACU  Anesthesia Type:General  Level of Consciousness: awake  Airway and Oxygen Therapy: Patient Spontanous Breathing  Post-op Pain: none  Post-op Assessment: Post-op Vital signs reviewed, Patient's Cardiovascular Status Stable, Respiratory Function Stable, Patent Airway, No signs of Nausea or vomiting and Pain level controlled  Post-op Vital Signs: Reviewed and stable  Last Vitals:  Filed Vitals:   12/28/14 1140  BP:   Pulse:   Temp: 36.7 C  Resp: 15    Complications: No apparent anesthesia complications

## 2014-12-28 NOTE — Progress Notes (Signed)
Report given to mark brande rn as caregiver 

## 2014-12-28 NOTE — Progress Notes (Signed)
Pt transferred from PACU with RN on monitor. Pt awake, oriented, responds to command and reports no pain. His speech is slightly garbled, this is his baseline per pt. Strength equal in all extremities,  Pt VSS. Will continue to monitor. Wife updated about pt location.

## 2014-12-28 NOTE — Anesthesia Procedure Notes (Addendum)
Procedure Name: Intubation Date/Time: 12/28/2014 7:53 AM Performed by: Susa Loffler Pre-anesthesia Checklist: Patient identified, Emergency Drugs available, Suction available, Patient being monitored and Timeout performed Patient Re-evaluated:Patient Re-evaluated prior to inductionOxygen Delivery Method: Circle system utilized Preoxygenation: Pre-oxygenation with 100% oxygen Intubation Type: IV induction Ventilation: Two handed mask ventilation required and Oral airway inserted - appropriate to patient size Laryngoscope Size: Sabra Heck and 2 Grade View: Grade I Tube type: Oral Tube size: 7.5 mm Number of attempts: 1 Airway Equipment and Method: Stylet Placement Confirmation: ETT inserted through vocal cords under direct vision,  positive ETCO2 and breath sounds checked- equal and bilateral Secured at: 24 cm Tube secured with: Tape Dental Injury: Teeth and Oropharynx as per pre-operative assessment  Comments: Atraumatic intubation by Nickolas Madrid

## 2014-12-28 NOTE — Anesthesia Preprocedure Evaluation (Addendum)
Anesthesia Evaluation  Patient identified by MRN, date of birth, ID band Patient awake    Reviewed: Allergy & Precautions, NPO status , Patient's Chart, lab work & pertinent test results  History of Anesthesia Complications Negative for: history of anesthetic complications  Airway Mallampati: III  TM Distance: >3 FB Neck ROM: Full    Dental  (+) Edentulous Upper, Poor Dentition,    Pulmonary neg shortness of breath, neg sleep apnea, neg COPDformer smoker,  breath sounds clear to auscultation        Cardiovascular hypertension, Pt. on medications + CAD, + Past MI, + Peripheral Vascular Disease and +CHF Rhythm:Regular     Neuro/Psych Intermittent slurred speech  Neuromuscular disease CVA, Residual Symptoms    GI/Hepatic negative GI ROS, Neg liver ROS,   Endo/Other  negative endocrine ROS  Renal/GU Renal InsufficiencyRenal disease     Musculoskeletal   Abdominal   Peds  Hematology negative hematology ROS (+)   Anesthesia Other Findings History includes former smoker, HTN, NSTEMI '06 s/p DES CX marginal and AV branch, STEMI s/p PTCA OM1 08/2009 followed by CABG X 4 08/23/09 (LIMA to LAD, SVG to OM1 and OM2, SVG to RCA), CHF (EF 35-40% 11/2006, 55% 09/2014), PVD s/p left Fem-AT BPG '10, DVT, chronic left foot drop, 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 He was last admitted on [facility] on 09/26/14 for uncontrolled hypertension due to medication non-compliance and LLE pain and weakness. During his hospitalization he had an episode of paroxysmal SVT and a one second nocturnal pause. He was admitted by the Hospitalist Service with neurology and cardiology consulting. He was seen by cardiolgoist Dr. Stanford Breed for PSVT which he felt was likely ST with non-conducted PACs. Echo showed normal EF and wall motion and no further cardiac work-up was felt indicated at that time. He also did  not feel b-blocker therapy was indicated. He last saw Richardson Dopp, PA-C with cardiology on 12/15/14 with preoperative nuclear stress test recommended.   PCP is listed as Selina Cooley, FNP.   Meds include amlodipine, ASA, Lipitor, hydralazine, lisinopril, tramadol.  12/15/14 EKG: NSR, possible inferior infarct (age undetermined).   He is scheduled for a nuclear stress test on 12/22/14. Per [employer] note, "Given his comorbid illnesses, he would be moderate risk for any procedure regardless of the results of his stress test. As long as his Myoview results are not high risk, he will require no further cardiac workup prior to his non-cardiac procedure."  09/27/14 Echo: - Left ventricle: The cavity size was normal. Wall thickness was increased in a pattern of mild LVH. The estimated ejection fraction was 55%. Wall motion was normal; there were no regional wall motion abnormalities. Doppler parameters are consistent with abnormal left ventricular relaxation (grade 1 diastolicdysfunction). - Left atrium: The atrium was mildly dilated. - Right ventricle: The cavity size was normal. Systolic function was normal.  Last cath seen is pre-CABG, done on 08/17/09. See Notes tab for full details.  11/17/14 Carotid duplex: < 40% RICA stenosis, 123456 LICA stenosis, bilateral ECA stenosis, bilateral vertebral artery flow is antegrade.  12/07/14 CXR:  1. Linear subsegmental atelectasis along both hemidiaphragms. 2. Moderate enlargement of the cardiopericardial silhouette, without edema. Prior CABG. 3. Atherosclerosis.  Preoperative labs noted.   Plans to proceed as scheduled will depend on upcoming nuclear stress test results. Chart will be left for follow-up.  George Hugh [facility] Short Stay Center/Anesthesiology Phone [phone] 12/20/2014 11:40 AM  Addendum:  Patient missed or rescheduled the last two dates of his stress test, so it was not done until today.  12/27/14 Nuclear stress  test results: Overall Impression: Intermediate risk stress nuclear study with a large, severe, partially reversible anterior lateral defect consistent with prior infarct and very mild peri-infarct ischemia. Study intermediate risk due to reduced LV function. LV Ejection Fraction: 42%. LV Wall Motion: Lateral akinesis.  I communicated with Dr. Oneida Alar that stress was intermediate risk. He reports that Dr. Stanford Breed gave him the okay for patient to proceed with planned surgery.  Reproductive/Obstetrics                            Anesthesia Physical Anesthesia Plan  ASA: III  Anesthesia Plan: General   Post-op Pain Management:    Induction: Intravenous  Airway Management Planned: Oral ETT  Additional Equipment: Arterial line  Intra-op Plan:   Post-operative Plan: Extubation in OR  Informed Consent: I have reviewed the patients History and Physical, chart, labs and discussed the procedure including the risks, benefits and alternatives for the proposed anesthesia with the patient or authorized representative who has indicated his/her understanding and acceptance.   Dental advisory given  Plan Discussed with: CRNA and Surgeon  Anesthesia Plan Comments:         Anesthesia Quick Evaluation

## 2014-12-28 NOTE — Op Note (Signed)
Procedure Left carotid endarterectomy  Preoperative diagnosis: High-grade asymptomatic left internal carotid artery stenosis  Postoperative diagnosis: Same  Anesthesia General  Asst.: Silva Bandy, [facility]  Operative findings: #1 greater than 80% left internal carotid stenosis                                                            #2 Dacron patch           #3 10 Fr shunt  Operative details: After obtaining informed consent, the patient was taken to the operating room. The patient was placed in a supine position on the operating room table. After induction of general anesthesia and endotracheal intubation a Foley catheter was placed. Next the patient's entire neck and chest was prepped and draped in the usual sterile fashion. An oblique incision was made on the left aspect of the patient's neck anterior to the border the left sternocleidomastoid muscle. The incision was carried into the subcutaneous tissues and through the platysma. The sternocleidomastoid muscle was identified and reflected laterally. The omohyoid muscle was identified and this was divided with cautery. The common carotid artery was then found at the base of the incision this was dissected free circumferentially. It was fairly soft on palpation.  The vagus nerve was identified and protected. Dissection was then carried up to the level carotid bifurcation.   The patient had a fairly sensitive carotid bifucation and would briefly become bradycardic with manipulation of this so it was anesthetized with 1% plain lidocaine.  The hyperglossal nerve was identified as well as the ansa cervicalis insertion into it.  These were mobilized medially to provide exposure of the distal internal and carotid bifurcation. The internal carotid artery was dissected free circumferentially just below the level of the hypoglossal nerve and it was soft in character at this location and above any palpable disease. A vessel loop was placed around this. The vessel  was fairly small. Next the external carotid and superior thyroid arteries were dissected free circumferentially and vessel loops were placed around these. The patient was given 10000 units of intravenous heparin.  After 2 minutes of circulation time and raising the mean arterial pressure to 90 mm mercury, the distal internal carotid artery was controlled with small bulldog clamp. The external carotid and superior thyroid arteries were controlled with vessel loops. The common carotid artery was controlled with a peripheral DeBakey clamp. A longitudinal opening was made in the common carotid artery just below the bifurcation. The arteriotomy was extended distally up into the internal carotid with Potts scissors. There was a large calcified plaque with greater than 80% stenosis in the internal carotid.  This was a small vessel. The 10 Fr was snug but did fit.  This was threaded into the distal internal carotid artery and allowed to backbleed thoroughly.  There was reasonable but not pulsatile backbleeding.  This was then threaded into the common carotid and secured with a Rummel tourniquet.  There was no air at this point and flow was restored to the brain.  Attention was then turned to the common carotid artery once again. A suitable endarterectomy plane was obtained and endarterectomy was begun in the common carotid artery and a good proximal endpoint was obtained. An eversion endarterectomy was performed on the external carotid artery and a good endpoint  was obtained. The plaque was then elevated in the internal carotid artery and a nice feathered distal endpoint was also obtained.  The plaque was passed off the table. There was one area on the posterior wall that was lifting slightly so this was tacked with a single 7 0 prolene suture.  All loose debris was then removed from the carotid bed and everything was thoroughly irrigated with heparinized saline. A Dacron patch was then brought on to the operative field and  this was sewn on as a patch angioplasty using a running 6-0 Prolene suture. Prior to completion of the anastomosis, the shunt was occluded with a hemostat and removed from the distal internal carotid which was again controlled with a fine bulldog clamp. The internal carotid artery was thoroughly backbled. This was then controlled again with a fine bulldog clamp.  The shunt was removed from the common carotid and the common carotid was thoroughly flushed forward. The common carotid was then reclamped and the external carotid was also thoroughly backbled.  The remainder of the patch was completed and the anastomosis was secured. Flow was then restored first retrograde from the external carotid into the carotid bed then antegrade from the common carotid to the external carotid artery and after approximately 5 cardiac cycles to the internal carotid artery. Doppler was used to evaluate the external/internal and common carotid arteries and these all had good Doppler flow. Hemostasis was obtained. The patient was also given 50 mg of Protamine.      The platysma muscle was reapproximated using a running 3-0 Vicryl suture. The skin was closed with 4 0 Vicryl subcuticular stitch.  The patient was awakened in the operating room and was moving upper and lower extremities symmetrically and following commands.  The patient was stable on arrival to the PACU.  Ruta Hinds, MD [facility] [place] Office: [phone] Pager: [phone]

## 2014-12-28 NOTE — Transfer of Care (Signed)
Immediate Anesthesia Transfer of Care Note  Patient: [name]  Procedure(s) Performed: Procedure(s): LEFT CAROTID ENDARTERECTOMY WITH PATCH ANGIOPLASTY (Left)  Patient Location: PACU  Anesthesia Type:General  Level of Consciousness: awake, alert  and oriented  Airway & Oxygen Therapy: Patient Spontanous Breathing and Patient connected to nasal cannula oxygen  Post-op Assessment: Report given to RN and Post -op Vital signs reviewed and stable  Post vital signs: Reviewed and stable  Last Vitals:  Filed Vitals:   12/28/14 0608  BP: 100/78  Pulse: 89  Temp: 36.9 C  Resp: 16    Complications: No apparent anesthesia complications

## 2014-12-28 NOTE — H&P (Signed)
[facility] [place] HISTORY AND PHYSICAL    History of Present Illness:  Patient is a 64 y.o. year old male who presents for evaluation of asymptomatic left internal carotid artery stenosis. Patient was seen by our nurse practitioner a few weeks ago and noted to have a carotid bruit. He has known moderate carotid stenosis in the past which has now progressed. He denies any symptoms of TIA amaurosis or stroke. The patient has also had a previous left femoral to anterior tibial bypass by me in 2010. He has a chronic left foot drop predating this. He states the foot drop is possibly due to a back injury he had several years ago. His ABI on the right side was noted recently to be 0.3. However, he denies any symptoms of rest pain or nonhealing wounds or claudication symptoms. He ambulates with a walker. His most recent left leg ABI was 0.65. He is overall fairly debilitated.  Other medical problems include coronary artery disease with prior history of CHF. Most recent ejection fraction by noninvasive testing was 40% in 2011. He has had previous coronary artery bypass grafting with vein harvest from the right leg. He also has a history of elevated cholesterol and hypertension. He states these problems are currently stable. He has noticed recently more swelling in both lower extremities. He also has become short of breath at rest on occasion.    Past Medical History   Diagnosis  Date   .  Hypertension     .  CHF (congestive heart failure)     .  Coronary artery disease     .  CVA (cerebral infarction)     .  High cholesterol     .  Peripheral vascular disease     .  Myocardial infarction  ~ 2011   .  Stroke  ~ 2009       "left eye; totally blind there"    .  Blind left eye  ~ 2009   .  DVT (deep venous thrombosis)     .  Carotid artery occlusion         Past Surgical History   Procedure  Laterality  Date   .  Cardiac catheterization       .  Coronary artery bypass graft     ?2011   .  Peripheral arterial stent graft    2000's       BLE     Social History History   Substance Use Topics   .  Smoking status:  Former Smoker -- 0.12 packs/day for 48 years       Types:  Cigarettes       Start date:  10/27/2014   .  Smokeless tobacco:  Never Used   .  Alcohol Use:  1.8 oz/week       3 Cans of beer per week         Comment: 06/30/2012 "used to drink constantly; now maybe 1/2 beer qod"     Family History Family History   Problem  Relation  Age of Onset   .  Diabetes  Sister     .  Heart disease  Sister     .  Heart disease  Brother       Allergies  No Known Allergies     Current Outpatient Prescriptions   Medication  Sig  Dispense  Refill   .  aspirin EC 81 MG tablet  Take 1 tablet (81 mg  total) by mouth daily.  30 tablet  3   .  atorvastatin (LIPITOR) 20 MG tablet  Take 20 mg by mouth daily.       .  hydrALAZINE (APRESOLINE) 50 MG tablet  Take 1 tablet (50 mg total) by mouth 2 (two) times daily.  60 tablet  3   .  lisinopril (PRINIVIL,ZESTRIL) 10 MG tablet  Take 2 tablets (20 mg total) by mouth daily.  60 tablet  3   .  Multiple Vitamin (MULTIVITAMIN WITH MINERALS) TABS tablet  Take 1 tablet by mouth daily.       .  traMADol (ULTRAM) 50 MG tablet  Take 1 tablet (50 mg total) by mouth every 6 (six) hours as needed. For pain  60 tablet  0      No current facility-administered medications for this visit.     ROS:    General:  No weight loss, Fever, chills  HEENT: No recent headaches, no nasal bleeding, no visual changes, no sore throat  Neurologic: No dizziness, blackouts, seizures. No recent symptoms of stroke or mini- stroke. No recent episodes of slurred speech, or temporary blindness.  Cardiac: No recent episodes of chest pain/pressure, no shortness of breath at rest.  + shortness of breath with exertion.  Denies history of atrial fibrillation or irregular heartbeat  Vascular: No history of rest pain in feet.  No history of claudication.  No  history of non-healing ulcer, No history of DVT    Pulmonary: No home oxygen, no productive cough, no hemoptysis,  No asthma or wheezing  Musculoskeletal:  [ ]  Arthritis, [ ]  Low back pain,  [ ]  Joint pain  Hematologic:No history of hypercoagulable state.  No history of easy bleeding.  No history of anemia  Gastrointestinal: No hematochezia or melena,  No gastroesophageal reflux, no trouble swallowing  Urinary: [ ]  chronic Kidney disease, [ ]  on HD - [ ]  MWF or [ ]  TTHS, [ ]  Burning with urination, [ ]  Frequent urination, [ ]  Difficulty urinating;    Skin: No rashes  Psychological: No history of anxiety,  No history of depression   Physical Examination    Filed Vitals:   12/28/14 0608  BP: 100/78  Pulse: 89  Temp: 98.5 F (36.9 C)  TempSrc: Oral  Resp: 16  Height: 6' (1.829 m)  Weight: 215 lb (97.523 kg)  SpO2: 97%    General:  Alert and oriented, no acute distress HEENT: Normal Neck: Left-sided carotid bruit Pulmonary: Clear to auscultation bilaterally Cardiac: Regular Rate and Rhythm without murmur Abdomen: Soft, non-tender, non-distended, no mass, slightly obese   Skin: No rash Extremity Pulses:  2+ radial, brachial, femoral, absent right dorsalis pedis, posterior tibial pulses 2+ graft pulse left lateral leg, left foot drop, no dorsalis pedis pulse on the left. Musculoskeletal: Foot drop left leg as mentioned above. Trace to 1+ edema left leg and right leg symmetric           Neurologic: Upper and lower extremity motor 4/5 and symmetric  DATA:  I reviewed the patient's lower extremity duplex dated . This shows a patent bypass graft to the anterior tibial artery and the left leg. ABI 0.65 ABI on the right 0.3. I also reviewed the patient's carotid duplex scan dated 11/17/2014. I reviewed and interpreted this study. Greater than 80% left internal carotid artery stenosis less than 40% right internal carotid artery stenosis   ASSESSMENT:  Patient with greater than 80%  asymptomatic left internal carotid artery stenosis.  Patient overall is fairly debilitated. He also has shortness of breath at rest and some lower extremity edema. He has a significant cardiac history.  History this is PAD is concerned. His bypass in the left leg is patent. His ABI on that side is also reasonable. Although his ABI has declined on the right side he is asymptomatic. I would not consider any intervention in the right leg unless he has evidence of tissue loss or rest pain. He is overall fairly debilitated and I do not believe he needs an intervention based on ABI findings alone.    PLAN:  Patient will continue his aspirin daily. He will undergo cardiac risk stratification. Based on the cardiac findings will make a determination on whether or not he would be a candidate for carotid endarterectomy. The lesion was fairly calcified on carotid duplex scans that he may not be a candidate for carotid stenting. Fortunately, he is asymptomatic.     I discussed with the patient the risks of carotid endarterectomy including but not limited to myocardial events 5%, cranial nerve injury 10-15%, stroke 1-2%, bleeding or infection 1%  I also discussed the benefits of long term stroke prevention and the advantage compared to medical therapy  The patient is aware of the risks and agrees to proceed forward with the procedure.  Ruta Hinds, MD [facility] [place] Office: [phone] Pager: [phone]

## 2014-12-29 ENCOUNTER — Encounter (HOSPITAL_COMMUNITY): Payer: Self-pay | Admitting: General Practice

## 2014-12-29 ENCOUNTER — Telehealth: Payer: Self-pay | Admitting: Vascular Surgery

## 2014-12-29 LAB — BASIC METABOLIC PANEL
Anion gap: 7 (ref 5–15)
BUN: 10 mg/dL (ref 6–23)
CO2: 21 mmol/L (ref 19–32)
Calcium: 8.5 mg/dL (ref 8.4–10.5)
Chloride: 108 mmol/L (ref 96–112)
Creatinine, Ser: 1.37 mg/dL — ABNORMAL HIGH (ref 0.50–1.35)
GFR calc Af Amer: 62 mL/min — ABNORMAL LOW (ref >90)
GFR calc non Af Amer: 53 mL/min — ABNORMAL LOW (ref >90)
Glucose, Bld: 111 mg/dL — ABNORMAL HIGH (ref 70–99)
Potassium: 3.7 mmol/L (ref 3.5–5.1)
Sodium: 136 mmol/L (ref 135–145)

## 2014-12-29 LAB — GLUCOSE, CAPILLARY
Glucose-Capillary: 108 mg/dL — ABNORMAL HIGH (ref 70–99)
Glucose-Capillary: 155 mg/dL — ABNORMAL HIGH (ref 70–99)

## 2014-12-29 LAB — CBC
HCT: 38.2 % — ABNORMAL LOW (ref 39.0–52.0)
Hemoglobin: 12.8 g/dL — ABNORMAL LOW (ref 13.0–17.0)
MCH: 26.5 pg (ref 26.0–34.0)
MCHC: 33.5 g/dL (ref 30.0–36.0)
MCV: 79.1 fL (ref 78.0–100.0)
Platelets: 208 10*3/uL (ref 150–400)
RBC: 4.83 MIL/uL (ref 4.22–5.81)
RDW: 14.9 % (ref 11.5–15.5)
WBC: 7.9 10*3/uL (ref 4.0–10.5)

## 2014-12-29 MED ORDER — PHENOL 1.4 % MT LIQD: 1.0000 | OROMUCOSAL | Status: DC | PRN

## 2014-12-29 MED ORDER — OXYCODONE HCL 5 MG PO TABS: 5.0000 mg | ORAL_TABLET | Freq: Four times a day (QID) | ORAL | Status: DC | PRN

## 2014-12-29 NOTE — Plan of Care (Signed)
Problem: Consults Goal: Diagnosis CEA/CES/AAA Stent Outcome: Adequate for Discharge Carotid Endarterectomy (CEA)     

## 2014-12-29 NOTE — Telephone Encounter (Signed)
-----   Message from Mena Goes, RN sent at 12/28/2014 11:42 AM EDT ----- Regarding: Schedule   ----- Message -----    From: Alvia Grove, PA-C    Sent: 12/28/2014  10:21 AM      To: Vvs Charge Pool  S/p left CEA 12/28/14  F/u with Dr. Oneida Alar in 2 weeks  Thanks Maudie Mercury

## 2014-12-29 NOTE — Progress Notes (Addendum)
   [facility] [place]  Subjective  - He is alert and states overall he feels well.  Mild throat soreness.   Objective 127/80 77 99.2 F (37.3 C) (Oral) 14 96%  Intake/Output Summary (Last 24 hours) at 12/29/14 0726 Last data filed at 12/29/14 0412  Gross per 24 hour  Intake 1252.5 ml  Output    300 ml  Net  952.5 ml    Left neck incision clean, dry and soft no hematoma Left grip 4+/5 right 5/5 baseline No tongue deviation, smile symmetrical   Assessment/Planning: POD # 1 Left CEA  He has a baseline weakness on the left upper extremity weakness and left foot drop. Nursing noted garbled speech which was confirmed by wife to be his baseline as well. He has a condom cath in place secondary to not being able to use urinal. D/C home later today after he has seen Dr. Carvel Getting, EMMA [facility] 12/29/2014 7:26 AM -- Neuro at baseline No neck hematoma Needs to get out of bed and ambulate D/c today ASA qd Follow up 2 weeks  Ruta Hinds, MD [facility] [place]: [phone] Pager: [phone]  Laboratory Lab Results:  Recent Labs  12/28/14 1834 12/29/14 0414  WBC 8.6 7.9  HGB 13.8 12.8*  HCT 40.5 38.2*  PLT 218 208   BMET  Recent Labs  12/28/14 1834 12/29/14 0414  NA  --  136  K  --  3.7  CL  --  108  CO2  --  21  GLUCOSE  --  111*  BUN  --  10  CREATININE 1.51* 1.37*  CALCIUM  --  8.5    COAG Lab Results  Component Value Date   INR 0.96 12/17/2014   INR 0.91 06/29/2012   INR 0.91 11/24/2009   No results found for: PTT

## 2014-12-29 NOTE — Telephone Encounter (Signed)
pts wife was walking out of the house and asked that I mail the information, dpm

## 2014-12-29 NOTE — Progress Notes (Signed)
Patient discharged at ~1420 via wheelchair with spouse to cab at main lobby. Reviewed discharge instruction with patient and spouse. Both verbalized understanding

## 2015-01-04 NOTE — Discharge Summary (Signed)
[facility] Discharge Summary   Patient ID:  [name] MRN: [id number] DOB/AGE: 1951-03-06 64 y.o.  Admit date: 12/28/2014 Discharge date: 01/04/2015 Date of Surgery: 12/28/2014 Surgeon: Surgeon(s): Elam Dutch, MD  Admission Diagnosis: Left carotid artery stenosis I65.22  Discharge Diagnoses:  Left carotid artery stenosis I65.22  Secondary Diagnoses: Past Medical History  Diagnosis Date  . Hypertension   . CHF (congestive heart failure)   . Coronary artery disease   . CVA (cerebral infarction)   . High cholesterol   . Peripheral vascular disease   . Myocardial infarction ~ 2011  . Stroke ~ 2009    "left eye; totally blind there"   . Blind left eye ~ 2009  . DVT (deep venous thrombosis)   . Carotid artery occlusion   . Shortness of breath dyspnea     occ  . Hx of cardiovascular stress test     Lexiscan Myoview 4/16: Anterolateral infarct with very mild peri-infarct ischemia, EF 42%; intermediate risk >>medical therapy recommended    Procedure(s): LEFT CAROTID ENDARTERECTOMY WITH PATCH ANGIOPLASTY  Discharged Condition: good  HPI: Patient is a 64 y.o. year old male who presents for evaluation of asymptomatic left internal carotid artery stenosis. Patient was seen by our nurse practitioner a few weeks ago and noted to have a carotid bruit. He has known moderate carotid stenosis in the past which has now progressed. He denies any symptoms of TIA amaurosis or stroke.  Other medical problems include coronary artery disease with prior history of CHF. Most recent ejection fraction by noninvasive testing was 40% in 2011. He has had previous coronary artery bypass grafting with vein harvest from the right leg. He also has a history of elevated cholesterol and hypertension. He states these problems are currently stable. He has noticed recently more swelling in both lower extremities. He also has become short of breath at rest on occasion.   Hospital  Course:  [name] is a 64 y.o. male is S/P Left Procedure(s): LEFT CAROTID ENDARTERECTOMY WITH PATCH ANGIOPLASTY POD# 1   Neuro at baseline No neck hematoma D/c today ASA qd Follow up 2 weeks  Significant Diagnostic Studies: CBC Lab Results  Component Value Date   WBC 7.9 12/29/2014   HGB 12.8* 12/29/2014   HCT 38.2* 12/29/2014   MCV 79.1 12/29/2014   PLT 208 12/29/2014    BMET    Component Value Date/Time   NA 136 12/29/2014 0414   K 3.7 12/29/2014 0414   CL 108 12/29/2014 0414   CO2 21 12/29/2014 0414   GLUCOSE 111* 12/29/2014 0414   BUN 10 12/29/2014 0414   CREATININE 1.37* 12/29/2014 0414   CALCIUM 8.5 12/29/2014 0414   GFRNONAA 53* 12/29/2014 0414   GFRAA 62* 12/29/2014 0414   COAG Lab Results  Component Value Date   INR 0.96 12/17/2014   INR 0.91 06/29/2012   INR 0.91 11/24/2009     Disposition:  Discharge to :Home Discharge Instructions    Call MD for:  redness, tenderness, or signs of infection (pain, swelling, bleeding, redness, odor or green/yellow discharge around incision site)    Complete by:  As directed      Call MD for:  severe or increased pain, loss or decreased feeling  in affected limb(s)    Complete by:  As directed      Call MD for:  temperature >100.5    Complete by:  As directed      Increase activity slowly  Complete by:  As directed   Walk with assistance use walker or cane as needed     Lifting restrictions    Complete by:  As directed   No lifting for 6 weeks     Resume previous diet    Complete by:  As directed             Medication List    TAKE these medications        amLODipine 10 MG tablet  Commonly known as:  NORVASC  Take 10 mg by mouth daily.     aspirin EC 81 MG tablet  Take 1 tablet (81 mg total) by mouth daily.     atorvastatin 10 MG tablet  Commonly known as:  LIPITOR  Take 10 mg by mouth daily.     hydrALAZINE 50 MG tablet  Commonly known as:  APRESOLINE  Take 1 tablet (50 mg total) by  mouth 2 (two) times daily.     lisinopril 10 MG tablet  Commonly known as:  PRINIVIL,ZESTRIL  Take 2 tablets (20 mg total) by mouth daily.     multivitamin with minerals Tabs tablet  Take 1 tablet by mouth daily.     oxyCODONE 5 MG immediate release tablet  Commonly known as:  Oxy IR/ROXICODONE  Take 1 tablet (5 mg total) by mouth every 6 (six) hours as needed for moderate pain.     traMADol 50 MG tablet  Commonly known as:  ULTRAM  Take 1 tablet (50 mg total) by mouth every 6 (six) hours as needed. For pain       Verbal and written Discharge instructions given to the patient. Wound care per Discharge AVS     Follow-up Information    Follow up with Ruta Hinds, MD In 2 weeks.   Specialty:  Vascular Surgery   Why:  Our office will call you to arrange an appointment (sent)   Contact information:   [address] [place] [address] [phone]       Signed: Laurence Slate [facility] 01/04/2015, 11:53 AM  --- For VQI Registry use --- Instructions: Press F2 to tab through selections.  Delete question if not applicable.   Modified Rankin score at D/C (0-6): Rankin Score=0  IV medication needed for:  1. Hypertension: No 2. Hypotension: No  Post-op Complications: No  1. Post-op CVA or TIA: No  If yes: Event classification (right eye, left eye, right cortical, left cortical, verterobasilar, other):   If yes: Timing of event (intra-op, <6 hrs post-op, >=6 hrs post-op, unknown):   2. CN injury: No  If yes: CN  injuried   3. Myocardial infarction: No  If yes: Dx by (EKG or clinical, Troponin):   4.  CHF: No  5.  Dysrhythmia (new): No  6. Wound infection: No  7. Reperfusion symptoms: No  8. Return to OR: No  If yes: return to OR for (bleeding, neurologic, other CEA incision, other):   Discharge medications: Statin use:  Yes ASA use:  Yes Beta blocker use:  No  for medical reason   ACE-Inhibitor use:  Yes P2Y12 Antagonist use: [x ] None, [ ]  Plavix, [  ] Plasugrel, [ ]  Ticlopinine, [ ]  Ticagrelor, [ ]  Other, [ ]  No for medical reason, [ ]  Non-compliant, [ ]  Not-indicated Anti-coagulant use:  [x ] None, [ ]  Warfarin, [ ]  Rivaroxaban, [ ]  Dabigatran, [ ]  Other, [ ]  No for medical reason, [ ]  Non-compliant, [ ]  Not-indicated

## 2015-01-12 ENCOUNTER — Encounter: Payer: Self-pay | Admitting: Vascular Surgery

## 2015-01-13 ENCOUNTER — Ambulatory Visit (INDEPENDENT_AMBULATORY_CARE_PROVIDER_SITE_OTHER): Payer: Self-pay | Admitting: Vascular Surgery

## 2015-01-13 ENCOUNTER — Encounter: Payer: Self-pay | Admitting: Vascular Surgery

## 2015-01-13 VITALS — BP 117/74 | HR 102 | Ht 72.0 in | Wt 207.6 lb

## 2015-01-13 DIAGNOSIS — I6522 Occlusion and stenosis of left carotid artery: Secondary | ICD-10-CM

## 2015-01-13 DIAGNOSIS — Z48812 Encounter for surgical aftercare following surgery on the circulatory system: Secondary | ICD-10-CM

## 2015-01-13 NOTE — Progress Notes (Signed)
[facility] [place] HISTORY AND PHYSICAL    History of Present Illness:  Patient is a 64 y.o. year old male who presents for post-operative follow-up after left carotid endarterectomy performed on 12/28/2014   .  Denies headaches, numbness, tingling or other neuro deficits. He has baseline left arm weakness and left foot drop and slurred speech. No swallowing problems.  No incisional drainage.  Physical Examination  Filed Vitals:   01/13/15 0821  BP: 117/74  Pulse: 102    Body mass index is 28.15 kg/(m^2).  General:  Alert and oriented, no acute distress Neck: No bruit or JVD, left neck incision well-healed Skin: No rash Neurologic: Right upper and lower extremity 5 over 5 motor, left foot drop, left upper extremity for over 5 motor  ASSESSMENT: Patient doing well status post left carotid endarterectomy.  Left femoral anterior tibial bypass 2010 patent   PLAN:  He needs bilateral carotid duplex scan in 6 months. He will need continued surveillance for his femoral anterior tibial bypass graft.   Ruta Hinds, MD [facility] [place] Office: [phone] Pager: [phone]

## 2015-01-13 NOTE — Addendum Note (Signed)
Addended by: Dorthula Rue L on: 01/13/2015 01:12 PM   Modules accepted: Orders

## 2015-03-03 NOTE — Progress Notes (Signed)
HPI: FU CAD; s/p DES to mid CFX extending into OM1 in 2006 and s/p lateral STEMI treated with POBA and thrombectomy of OM1 and subsequent CABG x 4 (L-LAD, S-OM1/OM2, S-RCA) in 2010, PAD s/p L fem to ant tibial bypass in 2010. Had CEA 2016. Also with h/o HTN, HL, prior drug abuse (cocaine) and ETOH abuse. He was DC from the practice by Dr. Verl Blalock in 2007 secondary to poor compliance with follow up.   Since last seen,    Studies/Reports Reviewed Today:  Carotid US 11/17/14 R < 40%; L 80-99%  Echo 09/27/14 - Mild LVH. EF 55%. Wall motion was normal. Grade 1 diastolic dysfunction  - Left atrium: The atrium was mildly dilated. - Right ventricle: The cavity size was normal. Systolic function was normal.  Myoview 4/16 - EF 42, large anterior lateral infarct with very mild peri-infarct ischemia.  Cardiac Cath 08/2009 LM: 30% LAD: prox 80% LCx: Mid stent 80% ISR, Prox OM1 99% (large thrombus burden), OM2 80% RCA: Mid 80% and 95% EF: 60% mild AL hypokinesis PCI: POBA to OM1  Current Outpatient Prescriptions  Medication Sig Dispense Refill  . amLODipine (NORVASC) 10 MG tablet Take 10 mg by mouth daily.    [name] aspirin EC 81 MG tablet Take 1 tablet (81 mg total) by mouth daily. 30 tablet 3  . atorvastatin (LIPITOR) 10 MG tablet Take 10 mg by mouth daily.    . hydrALAZINE (APRESOLINE) 50 MG tablet Take 1 tablet (50 mg total) by mouth 2 (two) times daily. 60 tablet 3  . lisinopril (PRINIVIL,ZESTRIL) 10 MG tablet Take 2 tablets (20 mg total) by mouth daily. 60 tablet 3  . Multiple Vitamin (MULTIVITAMIN WITH MINERALS) TABS tablet Take 1 tablet by mouth daily.    [name] oxyCODONE (OXY IR/ROXICODONE) 5 MG immediate release tablet Take 1 tablet (5 mg total) by mouth every 6 (six) hours as needed for moderate pain. 30 tablet 0  . traMADol (ULTRAM) 50 MG tablet Take 1 tablet (50 mg total) by mouth every 6 (six) hours as needed. For pain 60 tablet 0   No current facility-administered  medications for this visit.     Past Medical History  Diagnosis Date  . Hypertension   . CHF (congestive heart failure)   . Coronary artery disease   . CVA (cerebral infarction)   . High cholesterol   . Peripheral vascular disease   . Myocardial infarction ~ 2011  . Stroke ~ 2009    "left eye; totally blind there"   . Blind left eye ~ 2009  . DVT (deep venous thrombosis)   . Carotid artery occlusion   . Shortness of breath dyspnea     occ  . Hx of cardiovascular stress test     Lexiscan Myoview 4/16: Anterolateral infarct with very mild peri-infarct ischemia, EF 42%; intermediate risk >>medical therapy recommended    Past Surgical History  Procedure Laterality Date  . Cardiac catheterization    . Coronary artery bypass graft  ?2011  . Peripheral arterial stent graft  2000's    BLE  . Back surgery      1998-99 had disc removed from back  . Appendectomy    . Carotid endarterectomy Left 12/28/2014  . Endarterectomy Left 12/28/2014    Procedure: LEFT CAROTID ENDARTERECTOMY WITH PATCH ANGIOPLASTY;  Surgeon: Elam Dutch, MD;  Location: [place];  Service: Vascular;  Laterality: Left;    History   Social History  . Marital Status: Married  Spouse Name: N/A  . Number of Children: N/A  . Years of Education: N/A   Occupational History  . Not on file.   Social History Main Topics  . Smoking status: Former Smoker -- 0.12 packs/day for 48 years    Types: Cigarettes    Start date: 10/27/2014  . Smokeless tobacco: Never Used  . Alcohol Use: 1.8 oz/week    3 Cans of beer per week     Comment: used to drink constantly; now maybe 1/2 beer qod"  . Drug Use: Yes    Special: "Crack" cocaine     Comment: "stopped ~ 7-8 yrs ago yr ago"  . Sexual Activity: No   Other Topics Concern  . Not on file   Social History Narrative    ROS: no fevers or chills, productive cough, hemoptysis, dysphasia, odynophagia, melena, hematochezia, dysuria, hematuria, rash, seizure activity,  orthopnea, PND, pedal edema, claudication. Remaining systems are negative.  Physical Exam: Well-developed well-nourished in no acute distress.  Skin is warm and dry.  HEENT is normal.  Neck is supple.  Chest is clear to auscultation with normal expansion.  Cardiovascular exam is regular rate and rhythm.  Abdominal exam nontender or distended. No masses palpated. Extremities show no edema. neuro grossly intact  ECG     This encounter was created in error - please disregard.

## 2015-03-04 ENCOUNTER — Encounter: Payer: Medicare Other | Admitting: Cardiology

## 2015-04-18 NOTE — Progress Notes (Signed)
HPI: FU CAD s/p DES to mid CFX extending into OM1 in 2006 and s/p lateral STEMI treated with POBA and thrombectomy of OM1 and subsequent CABG x 4 (L-LAD, S-OM1/OM2, S-RCA) in 2010, PAD s/p L fem to ant tibial bypass in 2010, HTN, HL, prior drug abuse (cocaine) and ETOH abuse. He was DC from the practice by Dr. Verl Blalock in 2007 secondary to poor compliance with follow up.   Echo 1/16 demonstrated normal LVF.nuclear study April 2016 showed an ejection fraction of 42%. There was a large anterior lateral defect consistent with prior infarct and very mild peri-infarct ischemia. Treated medically. Patient had left carotid endarterectomy April 2016. Since last seen,     Studies/Reports Reviewed Today:  Carotid US 11/17/14 R < 40%; L 80-99%  Echo 09/27/14 - Mild LVH. EF 55%. Wall motion was normal. Grade 1 diastolic dysfunction  - Left atrium: The atrium was mildly dilated. - Right ventricle: The cavity size was normal. Systolic function was normal.  Myoview 11/2009 IMPRESSION: Large fixed defect involving the lateral wall extending into the anterior and inferior walls as described. Ejection fraction: 46%. No stress induced ischemia.  Cardiac Cath 08/2009 LM: 30% LAD: prox 80% LCx: Mid stent 80% ISR, Prox OM1 99% (large thrombus burden), OM2 80% RCA: Mid 80% and 95% EF: 60% mild AL hypokinesis PCI: POBA to OM1  Current Outpatient Prescriptions  Medication Sig Dispense Refill  . amLODipine (NORVASC) 10 MG tablet Take 10 mg by mouth daily.    [name] aspirin EC 81 MG tablet Take 1 tablet (81 mg total) by mouth daily. 30 tablet 3  . atorvastatin (LIPITOR) 10 MG tablet Take 10 mg by mouth daily.    . hydrALAZINE (APRESOLINE) 50 MG tablet Take 1 tablet (50 mg total) by mouth 2 (two) times daily. 60 tablet 3  . lisinopril (PRINIVIL,ZESTRIL) 10 MG tablet Take 2 tablets (20 mg total) by mouth daily. 60 tablet 3  . Multiple Vitamin (MULTIVITAMIN WITH MINERALS) TABS tablet Take 1 tablet by  mouth daily.    [name] oxyCODONE (OXY IR/ROXICODONE) 5 MG immediate release tablet Take 1 tablet (5 mg total) by mouth every 6 (six) hours as needed for moderate pain. 30 tablet 0  . traMADol (ULTRAM) 50 MG tablet Take 1 tablet (50 mg total) by mouth every 6 (six) hours as needed. For pain 60 tablet 0   No current facility-administered medications for this visit.     Past Medical History  Diagnosis Date  . Hypertension   . CHF (congestive heart failure)   . Coronary artery disease   . CVA (cerebral infarction)   . High cholesterol   . Peripheral vascular disease   . Myocardial infarction ~ 2011  . Stroke ~ 2009    "left eye; totally blind there"   . Blind left eye ~ 2009  . DVT (deep venous thrombosis)   . Carotid artery occlusion   . Shortness of breath dyspnea     occ  . Hx of cardiovascular stress test     Lexiscan Myoview 4/16: Anterolateral infarct with very mild peri-infarct ischemia, EF 42%; intermediate risk >>medical therapy recommended    Past Surgical History  Procedure Laterality Date  . Cardiac catheterization    . Coronary artery bypass graft  ?2011  . Peripheral arterial stent graft  2000's    BLE  . Back surgery      1998-99 had disc removed from back  . Appendectomy    . Carotid endarterectomy  Left 12/28/2014  . Endarterectomy Left 12/28/2014    Procedure: LEFT CAROTID ENDARTERECTOMY WITH PATCH ANGIOPLASTY;  Surgeon: Elam Dutch, MD;  Location: [facility] OR;  Service: Vascular;  Laterality: Left;    Social History   Social History  . Marital Status: Married    Spouse Name: N/A  . Number of Children: N/A  . Years of Education: N/A   Occupational History  . Not on file.   Social History Main Topics  . Smoking status: Former Smoker -- 0.12 packs/day for 48 years    Types: Cigarettes    Start date: 10/27/2014  . Smokeless tobacco: Never Used  . Alcohol Use: 1.8 oz/week    3 Cans of beer per week     Comment: used to drink constantly; now maybe 1/2 beer  qod"  . Drug Use: Yes    Special: "Crack" cocaine     Comment: "stopped ~ 7-8 yrs ago yr ago"  . Sexual Activity: No   Other Topics Concern  . Not on file   Social History Narrative    ROS: no fevers or chills, productive cough, hemoptysis, dysphasia, odynophagia, melena, hematochezia, dysuria, hematuria, rash, seizure activity, orthopnea, PND, pedal edema, claudication. Remaining systems are negative.  Physical Exam: Well-developed well-nourished in no acute distress.  Skin is warm and dry.  HEENT is normal.  Neck is supple.  Chest is clear to auscultation with normal expansion.  Cardiovascular exam is regular rate and rhythm.  Abdominal exam nontender or distended. No masses palpated. Extremities show no edema. neuro grossly intact  ECG     This encounter was created in error - please disregard.

## 2015-04-21 ENCOUNTER — Encounter: Payer: Medicare Other | Admitting: Cardiology

## 2015-06-14 NOTE — Progress Notes (Signed)
HPI: FU CAD s/p DES to mid CFX extending into OM1 in 2006 and s/p lateral STEMI treated with POBA and thrombectomy of OM1 and subsequent CABG x 4 (L-LAD, S-OM1/OM2, S-RCA) in 2010, PAD s/p L fem to ant tibial bypass in 2010, HTN, HL, prior drug abuse (cocaine) and ETOH abuse.  Echo demonstrated normal LVF. Nuclear study April 2016 showed an ejection fraction 42%. There was a prior anterolateral infarct with very mild peri-infarct ischemia. Had left carotid endarterectomy April 2016. Since last seen   Studies/Reports Reviewed Today:  Carotid US 11/17/14 R < 40%; L 80-99%  Echo 09/27/14 - Mild LVH. EF 55%. Wall motion was normal. Grade 1 diastolic dysfunction  - Left atrium: The atrium was mildly dilated. - Right ventricle: The cavity size was normal. Systolic function was normal.  Nuclear study 4/16 -EF 42, anterolateral infarct with very mild peri-infarct ischemia  Cardiac Cath 08/2009 LM: 30% LAD: prox 80% LCx: Mid stent 80% ISR, Prox OM1 99% (large thrombus burden), OM2 80% RCA: Mid 80% and 95% EF: 60% mild AL hypokinesis PCI: POBA to OM1  Current Outpatient Prescriptions  Medication Sig Dispense Refill  . amLODipine (NORVASC) 10 MG tablet Take 10 mg by mouth daily.    [name] aspirin EC 81 MG tablet Take 1 tablet (81 mg total) by mouth daily. 30 tablet 3  . atorvastatin (LIPITOR) 10 MG tablet Take 10 mg by mouth daily.    . hydrALAZINE (APRESOLINE) 50 MG tablet Take 1 tablet (50 mg total) by mouth 2 (two) times daily. 60 tablet 3  . lisinopril (PRINIVIL,ZESTRIL) 10 MG tablet Take 2 tablets (20 mg total) by mouth daily. 60 tablet 3  . Multiple Vitamin (MULTIVITAMIN WITH MINERALS) TABS tablet Take 1 tablet by mouth daily.    [name] oxyCODONE (OXY IR/ROXICODONE) 5 MG immediate release tablet Take 1 tablet (5 mg total) by mouth every 6 (six) hours as needed for moderate pain. 30 tablet 0  . traMADol (ULTRAM) 50 MG tablet Take 1 tablet (50 mg total) by mouth every 6 (six) hours  as needed. For pain 60 tablet 0   No current facility-administered medications for this visit.     Past Medical History  Diagnosis Date  . Hypertension   . CHF (congestive heart failure)   . Coronary artery disease   . CVA (cerebral infarction)   . High cholesterol   . Peripheral vascular disease   . Myocardial infarction ~ 2011  . Stroke ~ 2009    "left eye; totally blind there"   . Blind left eye ~ 2009  . DVT (deep venous thrombosis)   . Carotid artery occlusion   . Shortness of breath dyspnea     occ  . Hx of cardiovascular stress test     Lexiscan Myoview 4/16: Anterolateral infarct with very mild peri-infarct ischemia, EF 42%; intermediate risk >>medical therapy recommended    Past Surgical History  Procedure Laterality Date  . Cardiac catheterization    . Coronary artery bypass graft  ?2011  . Peripheral arterial stent graft  2000's    BLE  . Back surgery      1998-99 had disc removed from back  . Appendectomy    . Carotid endarterectomy Left 12/28/2014  . Endarterectomy Left 12/28/2014    Procedure: LEFT CAROTID ENDARTERECTOMY WITH PATCH ANGIOPLASTY;  Surgeon: Elam Dutch, MD;  [facility]: [facility] OR;  Service: Vascular;  Laterality: Left;    Social History   Social History  .  Marital Status: Married    Spouse Name: N/A  . Number of Children: N/A  . Years of Education: N/A   Occupational History  . Not on file.   Social History Main Topics  . Smoking status: Former Smoker -- 0.12 packs/day for 48 years    Types: Cigarettes    Start date: 10/27/2014  . Smokeless tobacco: Never Used  . Alcohol Use: 1.8 oz/week    3 Cans of beer per week     Comment: used to drink constantly; now maybe 1/2 beer qod"  . Drug Use: Yes    Special: "Crack" cocaine     Comment: "stopped ~ 7-8 yrs ago yr ago"  . Sexual Activity: No   Other Topics Concern  . Not on file   Social History Narrative    ROS: no fevers or chills, productive cough, hemoptysis, dysphasia,  odynophagia, melena, hematochezia, dysuria, hematuria, rash, seizure activity, orthopnea, PND, pedal edema, claudication. Remaining systems are negative.  Physical Exam: Well-developed well-nourished in no acute distress.  Skin is warm and dry.  HEENT is normal.  Neck is supple.  Chest is clear to auscultation with normal expansion.  Cardiovascular exam is regular rate and rhythm.  Abdominal exam nontender or distended. No masses palpated. Extremities show no edema. neuro grossly intact  ECG     This encounter was created in error - please disregard.

## 2015-06-17 ENCOUNTER — Encounter: Payer: Medicare Other | Admitting: Cardiology

## 2015-06-22 NOTE — Progress Notes (Signed)
HPI: FU CAD s/p DES to mid CFX extending into OM1 in 2006 and s/p lateral STEMI treated with POBA and thrombectomy of OM1 and subsequent CABG x 4 (L-LAD, S-OM1/OM2, S-RCA) in 2010, PAD s/p L fem to ant tibial bypass in 2010, HTN, HL, prior drug abuse (cocaine) and ETOH abuse. He was DC from the practice by Dr. Verl Blalock in 2007 secondary to poor compliance with follow up.  Followed by vascular surgery for PVD and CVD. Had left CEA 4/16. Echo 1/16 demonstrated normal LVF. Nuclear study 4/16 showed EF 42, prior anterior lateral infarct with very mild peri-infarct ischemia.   Studies/Reports Reviewed Today:  Carotid US 11/17/14 R < 40%; L 80-99%  Echo 09/27/14 - Mild LVH. EF 55%. Wall motion was normal. Grade 1 diastolic dysfunction  - Left atrium: The atrium was mildly dilated. - Right ventricle: The cavity size was normal. Systolic function was normal.   Current Outpatient Prescriptions  Medication Sig Dispense Refill  . amLODipine (NORVASC) 10 MG tablet Take 10 mg by mouth daily.    [name] aspirin EC 81 MG tablet Take 1 tablet (81 mg total) by mouth daily. 30 tablet 3  . atorvastatin (LIPITOR) 10 MG tablet Take 10 mg by mouth daily.    . hydrALAZINE (APRESOLINE) 50 MG tablet Take 1 tablet (50 mg total) by mouth 2 (two) times daily. 60 tablet 3  . lisinopril (PRINIVIL,ZESTRIL) 10 MG tablet Take 2 tablets (20 mg total) by mouth daily. 60 tablet 3  . Multiple Vitamin (MULTIVITAMIN WITH MINERALS) TABS tablet Take 1 tablet by mouth daily.    [name] oxyCODONE (OXY IR/ROXICODONE) 5 MG immediate release tablet Take 1 tablet (5 mg total) by mouth every 6 (six) hours as needed for moderate pain. 30 tablet 0  . traMADol (ULTRAM) 50 MG tablet Take 1 tablet (50 mg total) by mouth every 6 (six) hours as needed. For pain 60 tablet 0   No current facility-administered medications for this visit.     Past Medical History  Diagnosis Date  . Hypertension   . CHF (congestive heart failure)   . Coronary  artery disease   . CVA (cerebral infarction)   . High cholesterol   . Peripheral vascular disease   . Myocardial infarction ~ 2011  . Stroke ~ 2009    "left eye; totally blind there"   . Blind left eye ~ 2009  . DVT (deep venous thrombosis)   . Carotid artery occlusion   . Shortness of breath dyspnea     occ  . Hx of cardiovascular stress test     Lexiscan Myoview 4/16: Anterolateral infarct with very mild peri-infarct ischemia, EF 42%; intermediate risk >>medical therapy recommended    Past Surgical History  Procedure Laterality Date  . Cardiac catheterization    . Coronary artery bypass graft  ?2011  . Peripheral arterial stent graft  2000's    BLE  . Back surgery      1998-99 had disc removed from back  . Appendectomy    . Carotid endarterectomy Left 12/28/2014  . Endarterectomy Left 12/28/2014    Procedure: LEFT CAROTID ENDARTERECTOMY WITH PATCH ANGIOPLASTY;  Surgeon: Elam Dutch, MD;  Location: [facility] OR;  Service: Vascular;  Laterality: Left;    Social History   Social History  . Marital Status: Married    Spouse Name: N/A  . Number of Children: N/A  . Years of Education: N/A   Occupational History  . Not on file.  Social History Main Topics  . Smoking status: Former Smoker -- 0.12 packs/day for 48 years    Types: Cigarettes    Start date: 10/27/2014  . Smokeless tobacco: Never Used  . Alcohol Use: 1.8 oz/week    3 Cans of beer per week     Comment: used to drink constantly; now maybe 1/2 beer qod"  . Drug Use: Yes    Special: "Crack" cocaine     Comment: "stopped ~ 7-8 yrs ago yr ago"  . Sexual Activity: No   Other Topics Concern  . Not on file   Social History Narrative    ROS: no fevers or chills, productive cough, hemoptysis, dysphasia, odynophagia, melena, hematochezia, dysuria, hematuria, rash, seizure activity, orthopnea, PND, pedal edema, claudication. Remaining systems are negative.  Physical Exam: Well-developed well-nourished in no  acute distress.  Skin is warm and dry.  HEENT is normal.  Neck is supple.  Chest is clear to auscultation with normal expansion.  Cardiovascular exam is regular rate and rhythm.  Abdominal exam nontender or distended. No masses palpated. Extremities show no edema. neuro grossly intact  ECG     This encounter was created in error - please disregard.

## 2015-06-27 ENCOUNTER — Encounter: Payer: Medicare Other | Admitting: Cardiology

## 2015-07-21 ENCOUNTER — Encounter (HOSPITAL_COMMUNITY): Payer: Medicare Other

## 2015-07-21 ENCOUNTER — Ambulatory Visit: Payer: Medicare Other | Admitting: Family

## 2015-08-02 ENCOUNTER — Encounter: Payer: Self-pay | Admitting: Family

## 2015-08-05 ENCOUNTER — Ambulatory Visit: Payer: Medicare Other | Admitting: Family

## 2015-08-05 ENCOUNTER — Inpatient Hospital Stay (HOSPITAL_COMMUNITY): Admission: RE | Admit: 2015-08-05 | Payer: Medicare Other | Source: Ambulatory Visit

## 2015-08-11 ENCOUNTER — Encounter: Payer: Self-pay | Admitting: Family

## 2015-08-16 ENCOUNTER — Encounter (HOSPITAL_COMMUNITY): Payer: Medicare Other

## 2015-08-17 ENCOUNTER — Ambulatory Visit: Payer: Medicare Other | Admitting: Family

## 2015-09-27 ENCOUNTER — Encounter: Payer: Self-pay | Admitting: Family

## 2015-10-06 ENCOUNTER — Encounter: Payer: Self-pay | Admitting: Family

## 2015-10-06 ENCOUNTER — Ambulatory Visit (HOSPITAL_COMMUNITY)
Admission: RE | Admit: 2015-10-06 | Discharge: 2015-10-06 | Disposition: A | Discharge status: Home / Self Care | Payer: Medicare Other | Source: Ambulatory Visit | Attending: Vascular Surgery | Admitting: Vascular Surgery

## 2015-10-06 ENCOUNTER — Ambulatory Visit (INDEPENDENT_AMBULATORY_CARE_PROVIDER_SITE_OTHER): Payer: Medicare Other | Admitting: Family

## 2015-10-06 VITALS — BP 128/86 | HR 84 | Temp 98.7°F | Ht 72.0 in | Wt 211.0 lb

## 2015-10-06 DIAGNOSIS — E7800 Pure hypercholesterolemia, unspecified: Secondary | ICD-10-CM | POA: Insufficient documentation

## 2015-10-06 DIAGNOSIS — Z9889 Other specified postprocedural states: Secondary | ICD-10-CM

## 2015-10-06 DIAGNOSIS — Z4889 Encounter for other specified surgical aftercare: Secondary | ICD-10-CM

## 2015-10-06 DIAGNOSIS — I6523 Occlusion and stenosis of bilateral carotid arteries: Secondary | ICD-10-CM

## 2015-10-06 DIAGNOSIS — Z87891 Personal history of nicotine dependence: Secondary | ICD-10-CM

## 2015-10-06 DIAGNOSIS — I6522 Occlusion and stenosis of left carotid artery: Secondary | ICD-10-CM

## 2015-10-06 DIAGNOSIS — I739 Peripheral vascular disease, unspecified: Secondary | ICD-10-CM | POA: Diagnosis not present

## 2015-10-06 DIAGNOSIS — Z48812 Encounter for surgical aftercare following surgery on the circulatory system: Secondary | ICD-10-CM | POA: Diagnosis not present

## 2015-10-06 DIAGNOSIS — I10 Essential (primary) hypertension: Secondary | ICD-10-CM | POA: Insufficient documentation

## 2015-10-06 DIAGNOSIS — Z95828 Presence of other vascular implants and grafts: Secondary | ICD-10-CM

## 2015-10-06 NOTE — Progress Notes (Signed)
[facility] [place] HISTORY AND PHYSICAL   MRN : [id number]  History of Present Illness:   [name] is a 65 y.o. male patient of Dr. Oneida Alar who is s/p left femoral tibial artery bypass graft on 09/14/2008. He is also s/p left carotid endarterectomy performed on 12/28/2014.    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 Wife states he has had no subsequent strokes or TIA's.  He has left foot drop since a back surgery.  His speech is mostly monosyllabic and grunts, but pt and wife deny that this is a result of a stroke; he follows commands. He was scheduled to see a dentist March 2016 for extraction of remaining carious teeth, but this was cancelled, sounds like pending cardiac evaluation per wife.  He is short of breath, denies pain. He denies pain in his feet or legs when he walks, uses a walker.  He does report pain in the soles of his feet at night. No lower extremity non invasive vascular testing done today, unclear as to why this was not performed.   He denies non healing wounds in his feet/legs. He is wearing a diaper, wife indicates he has a little trouble with bladder incontinence, but mostly has control of his bowels.  06/29/12 carotid Duplex: Summary: The left internal carotid artery demonstrates elevated velocities in the upper 60-79% range of stenosis. The left external carotid artery also demonstrates elevated velocities, suggestive of stenosis. Vertebral arteries are patent with antegrade flow.  Pt Diabetic: No Pt smoker: former smoker, stopped April 2016, started smoking about age 20.  Pt meds include: Statin :Yes Betablocker: No ASA: Yes Other anticoagulants/antiplatelets: no    Current Outpatient Prescriptions  Medication Sig Dispense Refill  . amLODipine (NORVASC) 10 MG tablet Take 10 mg by mouth daily.    [name] aspirin EC 81 MG tablet Take 1 tablet (81 mg total) by mouth daily. 30 tablet 3  .  atorvastatin (LIPITOR) 10 MG tablet Take 10 mg by mouth daily.    . hydrALAZINE (APRESOLINE) 50 MG tablet Take 1 tablet (50 mg total) by mouth 2 (two) times daily. 60 tablet 3  . lisinopril (PRINIVIL,ZESTRIL) 10 MG tablet Take 2 tablets (20 mg total) by mouth daily. 60 tablet 3  . Multiple Vitamin (MULTIVITAMIN WITH MINERALS) TABS tablet Take 1 tablet by mouth daily.    [name] oxyCODONE (OXY IR/ROXICODONE) 5 MG immediate release tablet Take 1 tablet (5 mg total) by mouth every 6 (six) hours as needed for moderate pain. 30 tablet 0  . traMADol (ULTRAM) 50 MG tablet Take 1 tablet (50 mg total) by mouth every 6 (six) hours as needed. For pain 60 tablet 0   No current facility-administered medications for this visit.    Past Medical History  Diagnosis Date  . Hypertension   . CHF (congestive heart failure)   . Coronary artery disease   . CVA (cerebral infarction)   . High cholesterol   . Peripheral vascular disease   . Myocardial infarction ([place]) ~ 2011  . Stroke ~ 2009    "left eye; totally blind there"   . Blind left eye ~ 2009  . DVT (deep venous thrombosis)   . Carotid artery occlusion   . Shortness of breath dyspnea     occ  . Hx of cardiovascular stress test     Lexiscan Myoview 4/16: Anterolateral infarct with very mild peri-infarct ischemia, EF 42%; intermediate risk >>medical  therapy recommended    Social History Social History  Substance Use Topics  . Smoking status: Former Smoker -- 0.12 packs/day for 48 years    Types: Cigarettes    Start date: 10/27/2014  . Smokeless tobacco: Never Used  . Alcohol Use: 1.8 oz/week    3 Cans of beer per week     Comment: used to drink constantly; now maybe 1/2 beer qod"    Family History Family History  Problem Relation Age of Onset  . Diabetes Sister   . Heart disease Sister   . Heart disease Brother     Surgical History Past Surgical History  Procedure Laterality Date  . Cardiac catheterization    . Coronary artery bypass  graft  ?2011  . Peripheral arterial stent graft  2000's    BLE  . Back surgery      1998-99 had disc removed from back  . Appendectomy    . Carotid endarterectomy Left 12/28/2014  . Endarterectomy Left 12/28/2014    Procedure: LEFT CAROTID ENDARTERECTOMY WITH PATCH ANGIOPLASTY;  Surgeon: Elam Dutch, MD;  Location: [facility];  Service: Vascular;  Laterality: Left;    No Known Allergies  Current Outpatient Prescriptions  Medication Sig Dispense Refill  . amLODipine (NORVASC) 10 MG tablet Take 10 mg by mouth daily.    [name] aspirin EC 81 MG tablet Take 1 tablet (81 mg total) by mouth daily. 30 tablet 3  . atorvastatin (LIPITOR) 10 MG tablet Take 10 mg by mouth daily.    . hydrALAZINE (APRESOLINE) 50 MG tablet Take 1 tablet (50 mg total) by mouth 2 (two) times daily. 60 tablet 3  . lisinopril (PRINIVIL,ZESTRIL) 10 MG tablet Take 2 tablets (20 mg total) by mouth daily. 60 tablet 3  . Multiple Vitamin (MULTIVITAMIN WITH MINERALS) TABS tablet Take 1 tablet by mouth daily.    [name] oxyCODONE (OXY IR/ROXICODONE) 5 MG immediate release tablet Take 1 tablet (5 mg total) by mouth every 6 (six) hours as needed for moderate pain. 30 tablet 0  . traMADol (ULTRAM) 50 MG tablet Take 1 tablet (50 mg total) by mouth every 6 (six) hours as needed. For pain 60 tablet 0   No current facility-administered medications for this visit.     REVIEW OF SYSTEMS: See HPI for pertinent positives and negatives.  Physical Examination Filed Vitals:   10/06/15 1412 10/06/15 1414  BP: 125/81 128/86  Pulse: 84   Temp: 98.7 F (37.1 C)   TempSrc: Oral   Height: 6' (1.829 m)   Weight: 211 lb (95.709 kg)   SpO2: 96%    Body mass index is 28.61 kg/(m^2).   General: WDWN in NAD Gait: using walker, left foot drop, slow, deliberate HENT: few remaining teeth are in advanced stages of decay. Eyes: Pupils equal Pulmonary: Mildly labored breathing, little air movement in all fields, no rales, rhonchi,or  wheezing Cardiac: RRR, no murmur detected  Abdomen: soft, NT, no masses palpated Skin: no rashes, no ulcers, no cellulitis.[name]   VASCULAR EXAM  Carotid Bruits Right Left   negative negative   Aorta is not palpable Radial pulses are 2+ palpable   VASCULAR EXAM: Extremities without ischemic changes,  without Gangrene; without open wounds.      LE Pulses Right Left   FEMORAL  palpable  palpable    POPLITEAL not palpable  not palpable   POSTERIOR TIBIAL not palpable, no Doppler signal  not palpable, audible Doppler signal    DORSALIS PEDIS  ANTERIOR TIBIAL  not palpable, no Doppler signal  not palpable, no Doppler signal     Musculoskeletal: no muscle wasting or atrophy; no peripheral edema. Weak dorsiflexion of left foot. Left foot drop. Neurologic: A&O X 3; monosyllabic responses and grunts to questions,  MOTOR FUNCTION: 5/5 in upper extremities and right LE extremity, 4/5 in left lower extremity, CN 2-12 intact             Non-Invasive Vascular Imaging (10/06/2015):  CEREBROVASCULAR DUPLEX EVALUATION    INDICATION: Carotid artery disease    PREVIOUS INTERVENTION(S): Left carotid endarterectomy 12/28/2014    DUPLEX EXAM: Carotid duplex    RIGHT  LEFT  Peak Systolic Velocities (cm/s) End Diastolic Velocities (cm/s) Plaque LOCATION Peak Systolic Velocities (cm/s) End Diastolic Velocities (cm/s) Plaque  80 16  CCA PROXIMAL 97 23   57 16  CCA MID 66 17   59 12 HT CCA DISTAL 77 16   146 20 HT ECA 68 10   34 11 HT ICA PROXIMAL 47 17 HM  53 23  ICA MID 63 25   56 26  ICA DISTAL 65 28     .59 ICA / CCA Ratio (PSV) N/A  Antegrade Vertebral Flow Antegrade  - Brachial Systolic Pressure (mmHg) -  Triphasic Brachial Artery Waveforms Triphasic     Plaque Morphology:  HM = Homogeneous, HT = Heterogeneous, CP = Calcific Plaque, SP = Smooth Plaque, IP = Irregular Plaque     ADDITIONAL FINDINGS: Biphasic subclavian arteries    IMPRESSION: 1. Less than 40% right internal carotid artery stenosis 2. Patent left carotid endarterectomy site with no evidence for restenosis    Compared to the previous exam:  No post op exam     ASSESSMENT:  [name] is a 65 y.o. male who is s/p left femoral tibial artery bypass graft on 09/14/2008. He is also s/p left carotid endarterectomy performed on 12/28/2014.  He has had no TIA or stroke since 2011 or 2012.  Today's carotid duplex suggests less than 40% right internal carotid artery stenosis Patent left carotid endarterectomy site with no evidence for restenosis. This is the first carotid duplex s/p left CEA.  No change in the right ICA.    Unable to detect Doppler signals in pedal pulses except for left PT; he has no signs of ischemia in his feet/legs. He has no claudication sx's with walking or at rest. He has pain in the soles of his feet at night only.   No lower extremity non invasive vascular testing done today, see Plan.   PLAN:   Based on today's exam and non-invasive vascular lab results, the patient will follow up in 1-2 weeks with the following tests: ABI's and left LE arterial duplex; carotid duplex in 6 months. I discussed in depth with the patient the nature of atherosclerosis, and emphasized the importance of maximal medical management including strict control of blood pressure, blood glucose, and lipid levels, obtaining regular exercise, and cessation of smoking.  The patient is aware that without maximal medical management the underlying atherosclerotic disease process will progress, limiting the benefit of any interventions.  The patient was given information about stroke prevention and what symptoms should prompt the patient to seek immediate medical care.  The patient  was given information about PAD including signs, symptoms, treatment, what symptoms should prompt the patient to seek immediate medical care, and risk reduction measures to take. Thank you for allowing [place] to participate in this patient's care.  Vinnie Level Revia Nghiem, RN, MSN, FNP-C  [facility] Office: [phone]  Clinic MD: [facility]  10/06/2015 1:07 PM

## 2015-10-06 NOTE — Patient Instructions (Signed)
Stroke Prevention Some medical conditions and behaviors are associated with an increased chance of having a stroke. You may prevent a stroke by making healthy choices and managing medical conditions. HOW CAN I REDUCE MY RISK OF HAVING A STROKE?   Stay physically active. Get at least 30 minutes of activity on most or all days.  Do not smoke. It may also be helpful to avoid exposure to secondhand smoke.  Limit alcohol use. Moderate alcohol use is considered to be:  No more than 2 drinks per day for men.  No more than 1 drink per day for nonpregnant women.  Eat healthy foods. This involves:  Eating 5 or more servings of fruits and vegetables a day.  Making dietary changes that address high blood pressure (hypertension), high cholesterol, diabetes, or obesity.  Manage your cholesterol levels.  Making food choices that are high in fiber and low in saturated fat, trans fat, and cholesterol may control cholesterol levels.  Take any prescribed medicines to control cholesterol as directed by your health care provider.  Manage your diabetes.  Controlling your carbohydrate and sugar intake is recommended to manage diabetes.  Take any prescribed medicines to control diabetes as directed by your health care provider.  Control your hypertension.  Making food choices that are low in salt (sodium), saturated fat, trans fat, and cholesterol is recommended to manage hypertension.  Ask your health care provider if you need treatment to lower your blood pressure. Take any prescribed medicines to control hypertension as directed by your health care provider.  If you are 18-39 years of age, have your blood pressure checked every 3-5 years. If you are 40 years of age or older, have your blood pressure checked every year.  Maintain a healthy weight.  Reducing calorie intake and making food choices that are low in sodium, saturated fat, trans fat, and cholesterol are recommended to manage  weight.  Stop drug abuse.  Avoid taking birth control pills.  Talk to your health care provider about the risks of taking birth control pills if you are over 35 years old, smoke, get migraines, or have ever had a blood clot.  Get evaluated for sleep disorders (sleep apnea).  Talk to your health care provider about getting a sleep evaluation if you snore a lot or have excessive sleepiness.  Take medicines only as directed by your health care provider.  For some people, aspirin or blood thinners (anticoagulants) are helpful in reducing the risk of forming abnormal blood clots that can lead to stroke. If you have the irregular heart rhythm of atrial fibrillation, you should be on a blood thinner unless there is a good reason you cannot take them.  Understand all your medicine instructions.  Make sure that other conditions (such as anemia or atherosclerosis) are addressed. SEEK IMMEDIATE MEDICAL CARE IF:   You have sudden weakness or numbness of the face, arm, or leg, especially on one side of the body.  Your face or eyelid droops to one side.  You have sudden confusion.  You have trouble speaking (aphasia) or understanding.  You have sudden trouble seeing in one or both eyes.  You have sudden trouble walking.  You have dizziness.  You have a loss of balance or coordination.  You have a sudden, severe headache with no known cause.  You have new chest pain or an irregular heartbeat. Any of these symptoms may represent a serious problem that is an emergency. Do not wait to see if the symptoms will   go away. Get medical help at once. Call your local emergency services (911 in [place] Do not drive yourself to the hospital.   This information is not intended to replace advice given to you by your health care provider. Make sure you discuss any questions you have with your health care provider.   Document Released: 09/27/2004 Document Revised: 09/10/2014 Document Reviewed:  02/20/2013 Elsevier Interactive Patient Education 2016 Elsevier Inc.    Peripheral Vascular Disease Peripheral vascular disease (PVD) is a disease of the blood vessels that are not part of your heart and brain. A simple term for PVD is poor circulation. In most cases, PVD narrows the blood vessels that carry blood from your heart to the rest of your body. This can result in a decreased supply of blood to your arms, legs, and internal organs, like your stomach or kidneys. However, it most often affects a person's lower legs and feet. There are two types of PVD.  Organic PVD. This is the more common type. It is caused by damage to the structure of blood vessels.  Functional PVD. This is caused by conditions that make blood vessels contract and tighten (spasm). Without treatment, PVD tends to get worse over time. PVD can also lead to acute ischemic limb. This is when an arm or limb suddenly has trouble getting enough blood. This is a medical emergency. CAUSES Each type of PVD has many different causes. The most common cause of PVD is buildup of a fatty material (plaque) inside of your arteries (atherosclerosis). Small amounts of plaque can break off from the walls of the blood vessels and become lodged in a smaller artery. This blocks blood flow and can cause acute ischemic limb. Other common causes of PVD include:  Blood clots that form inside of blood vessels.  Injuries to blood vessels.  Diseases that cause inflammation of blood vessels or cause blood vessel spasms.  Health behaviors and health history that increase your risk of developing PVD. RISK FACTORS  You may have a greater risk of PVD if you:  Have a family history of PVD.  Have certain medical conditions, including:  High cholesterol.  Diabetes.  High blood pressure (hypertension).  Coronary heart disease.  Past problems with blood clots.  Past injury, such as burns or a broken bone. These may have damaged blood  vessels in your limbs.  Buerger disease. This is caused by inflamed blood vessels in your hands and feet.  Some forms of arthritis.  Rare birth defects that affect the arteries in your legs.  Use tobacco.  Do not get enough exercise.  Are obese.  Are age 50 or older. SIGNS AND SYMPTOMS  PVD may cause many different symptoms. Your symptoms depend on what part of your body is not getting enough blood. Some common signs and symptoms include:  Cramps in your lower legs. This may be a symptom of poor leg circulation (claudication).  Pain and weakness in your legs while you are physically active that goes away when you rest (intermittent claudication).  Leg pain when at rest.  Leg numbness, tingling, or weakness.  Coldness in a leg or foot, especially when compared with the other leg.  Skin or hair changes. These can include:  Hair loss.  Shiny skin.  Pale or bluish skin.  Thick toenails.  Inability to get or maintain an erection (erectile dysfunction). People with PVD are more prone to developing ulcers and sores on their toes, feet, or legs. These may take longer than   normal to heal. DIAGNOSIS Your health care provider may diagnose PVD from your signs and symptoms. The health care provider will also do a physical exam. You may have tests to find out what is causing your PVD and determine its severity. Tests may include:  Blood pressure recordings from your arms and legs and measurements of the strength of your pulses (pulse volume recordings).  Imaging studies using sound waves to take pictures of the blood flow through your blood vessels (Doppler ultrasound).  Injecting a dye into your blood vessels before having imaging studies using:  X-rays (angiogram or arteriogram).  Computer-generated X-rays (CT angiogram).  A powerful electromagnetic field and a computer (magnetic resonance angiogram or MRA). TREATMENT Treatment for PVD depends on the cause of your condition  and the severity of your symptoms. It also depends on your age. Underlying causes need to be treated and controlled. These include long-lasting (chronic) conditions, such as diabetes, high cholesterol, and high blood pressure. You may need to first try making lifestyle changes and taking medicines. Surgery may be needed if these do not work. Lifestyle changes may include:  Quitting smoking.  Exercising regularly.  Following a low-fat, low-cholesterol diet. Medicines may include:  Blood thinners to prevent blood clots.  Medicines to improve blood flow.  Medicines to improve your blood cholesterol levels. Surgical procedures may include:  A procedure that uses an inflated balloon to open a blocked artery and improve blood flow (angioplasty).  A procedure to put in a tube (stent) to keep a blocked artery open (stent implant).  Surgery to reroute blood flow around a blocked artery (peripheral bypass surgery).  Surgery to remove dead tissue from an infected wound on the affected limb.  Amputation. This is surgical removal of the affected limb. This may be necessary in cases of acute ischemic limb that are not improved through medical or surgical treatments. HOME CARE INSTRUCTIONS  Take medicines only as directed by your health care provider.  Do not use any tobacco products, including cigarettes, chewing tobacco, or electronic cigarettes. If you need help quitting, ask your health care provider.  Lose weight if you are overweight, and maintain a healthy weight as directed by your health care provider.  Eat a diet that is low in fat and cholesterol. If you need help, ask your health care provider.  Exercise regularly. Ask your health care provider to suggest some good activities for you.  Use compression stockings or other mechanical devices as directed by your health care provider.  Take good care of your feet.  Wear comfortable shoes that fit well.  Check your feet often for  any cuts or sores. SEEK MEDICAL CARE IF:  You have cramps in your legs while walking.  You have leg pain when you are at rest.  You have coldness in a leg or foot.  Your skin changes.  You have erectile dysfunction.  You have cuts or sores on your feet that are not healing. SEEK IMMEDIATE MEDICAL CARE IF:  Your arm or leg turns cold and blue.  Your arms or legs become red, warm, swollen, painful, or numb.  You have chest pain or trouble breathing.  You suddenly have weakness in your face, arm, or leg.  You become very confused or lose the ability to speak.  You suddenly have a very bad headache or lose your vision.   This information is not intended to replace advice given to you by your health care provider. Make sure you discuss any questions   you have with your health care provider.   Document Released: 09/27/2004 Document Revised: 09/10/2014 Document Reviewed: 01/28/2014 Elsevier Interactive Patient Education 2016 Elsevier Inc.  

## 2015-10-06 NOTE — Addendum Note (Signed)
Addended by: Reola Calkins on: 10/06/2015 03:31 PM   Modules accepted: Orders

## 2015-10-20 ENCOUNTER — Ambulatory Visit (INDEPENDENT_AMBULATORY_CARE_PROVIDER_SITE_OTHER): Payer: Medicare Other | Admitting: Family

## 2015-10-20 ENCOUNTER — Ambulatory Visit (HOSPITAL_COMMUNITY)
Admission: RE | Admit: 2015-10-20 | Discharge: 2015-10-20 | Disposition: A | Discharge status: Home / Self Care | Payer: Medicare Other | Source: Ambulatory Visit | Attending: Family | Admitting: Family

## 2015-10-20 ENCOUNTER — Encounter: Payer: Self-pay | Admitting: Family

## 2015-10-20 ENCOUNTER — Ambulatory Visit (INDEPENDENT_AMBULATORY_CARE_PROVIDER_SITE_OTHER)
Admission: RE | Admit: 2015-10-20 | Discharge: 2015-10-20 | Disposition: A | Discharge status: Home / Self Care | Payer: Medicare Other | Source: Ambulatory Visit | Attending: Family | Admitting: Family

## 2015-10-20 VITALS — BP 121/86 | HR 93 | Temp 97.0°F | Resp 18 | Ht 72.0 in | Wt 210.0 lb

## 2015-10-20 DIAGNOSIS — I6523 Occlusion and stenosis of bilateral carotid arteries: Secondary | ICD-10-CM | POA: Diagnosis not present

## 2015-10-20 DIAGNOSIS — Z4889 Encounter for other specified surgical aftercare: Secondary | ICD-10-CM | POA: Diagnosis not present

## 2015-10-20 DIAGNOSIS — I779 Disorder of arteries and arterioles, unspecified: Secondary | ICD-10-CM | POA: Diagnosis not present

## 2015-10-20 DIAGNOSIS — I739 Peripheral vascular disease, unspecified: Secondary | ICD-10-CM | POA: Insufficient documentation

## 2015-10-20 DIAGNOSIS — I509 Heart failure, unspecified: Secondary | ICD-10-CM | POA: Diagnosis not present

## 2015-10-20 DIAGNOSIS — Z48812 Encounter for surgical aftercare following surgery on the circulatory system: Secondary | ICD-10-CM

## 2015-10-20 DIAGNOSIS — Z87891 Personal history of nicotine dependence: Secondary | ICD-10-CM | POA: Diagnosis not present

## 2015-10-20 DIAGNOSIS — Z9889 Other specified postprocedural states: Secondary | ICD-10-CM

## 2015-10-20 DIAGNOSIS — R938 Abnormal findings on diagnostic imaging of other specified body structures: Secondary | ICD-10-CM | POA: Insufficient documentation

## 2015-10-20 DIAGNOSIS — Z95828 Presence of other vascular implants and grafts: Secondary | ICD-10-CM | POA: Insufficient documentation

## 2015-10-20 DIAGNOSIS — I6522 Occlusion and stenosis of left carotid artery: Secondary | ICD-10-CM

## 2015-10-20 DIAGNOSIS — I110 Hypertensive heart disease with heart failure: Secondary | ICD-10-CM | POA: Diagnosis not present

## 2015-10-20 DIAGNOSIS — E7800 Pure hypercholesterolemia, unspecified: Secondary | ICD-10-CM | POA: Insufficient documentation

## 2015-10-20 NOTE — Progress Notes (Signed)
[facility] [place] HISTORY AND PHYSICAL -PAD  History of Present Illness [name] is a 65 y.o. male patient of Dr. Oneida Alar who is s/p left femoral tibial artery bypass graft on 09/14/2008. He is also s/p left carotid endarterectomy performed on 12/28/2014.   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 Wife states he has had no subsequent strokes or TIA's.  He has left foot drop since a back surgery.  His speech is mostly monosyllabic and grunts, but pt and wife deny that this is a result of a stroke; he follows commands. He was scheduled to see a dentist March 2016 for extraction of remaining carious teeth, but this was cancelled, sounds like pending cardiac evaluation per wife.  He is short of breath, denies pain. He denies pain in his feet or legs when he walks, uses a walker.  Wife states pt walks in their house a great deal He does report pain in the soles of his feet at night.  He denies non healing wounds in his feet/legs. He is wearing a diaper, wife indicates he has a little trouble with bladder incontinence, but mostly has control of his bowels.  06/29/12 carotid Duplex: Summary: The left internal carotid artery demonstrates elevated velocities in the upper 60-79% range of stenosis. The left external carotid artery also demonstrates elevated velocities, suggestive of stenosis. Vertebral arteries are patent with antegrade flow.  Pt Diabetic: No Pt smoker: former smoker, stopped April 2016, started smoking about age 29.  Pt meds include: Statin :Yes Betablocker: No ASA: Yes Other anticoagulants/antiplatelets: no   Past Medical History  Diagnosis Date  . Hypertension   . CHF (congestive heart failure) ([place])   . Coronary artery disease   . CVA (cerebral infarction)   . High cholesterol   . Peripheral vascular disease ([place])   . Myocardial infarction ([place]) ~ 2011  . Stroke [facility]) ~ 2009    "left eye;  totally blind there"   . Blind left eye ~ 2009  . DVT (deep venous thrombosis) ([place])   . Carotid artery occlusion   . Shortness of breath dyspnea     occ  . Hx of cardiovascular stress test     Lexiscan Myoview 4/16: Anterolateral infarct with very mild peri-infarct ischemia, EF 42%; intermediate risk >>medical therapy recommended    Social History Social History  Substance Use Topics  . Smoking status: Former Smoker -- 0.12 packs/day for 48 years    Types: Cigarettes    Start date: 10/27/2014  . Smokeless tobacco: Never Used  . Alcohol Use: 1.8 oz/week    3 Cans of beer per week     Comment: used to drink constantly; now maybe 1/2 beer qod"    Family History Family History  Problem Relation Age of Onset  . Diabetes Sister   . Heart disease Sister   . Heart disease Brother     Past Surgical History  Procedure Laterality Date  . Cardiac catheterization    . Coronary artery bypass graft  ?2011  . Peripheral arterial stent graft  2000's    BLE  . Back surgery      1998-99 had disc removed from back  . Appendectomy    . Carotid endarterectomy Left 12/28/2014  . Endarterectomy Left 12/28/2014    Procedure: LEFT CAROTID ENDARTERECTOMY WITH PATCH ANGIOPLASTY;  Surgeon: Elam Dutch, MD;  Location: [place];  Service: Vascular;  Laterality: Left;  No Known Allergies  Current Outpatient Prescriptions  Medication Sig Dispense Refill  . amLODipine (NORVASC) 10 MG tablet Take 10 mg by mouth daily.    [name] aspirin EC 81 MG tablet Take 1 tablet (81 mg total) by mouth daily. 30 tablet 3  . atorvastatin (LIPITOR) 10 MG tablet Take 10 mg by mouth daily.    . hydrALAZINE (APRESOLINE) 50 MG tablet Take 1 tablet (50 mg total) by mouth 2 (two) times daily. 60 tablet 3  . lisinopril (PRINIVIL,ZESTRIL) 10 MG tablet Take 2 tablets (20 mg total) by mouth daily. 60 tablet 3  . Multiple Vitamin (MULTIVITAMIN WITH MINERALS) TABS tablet Take 1 tablet by mouth daily.    [name] oxyCODONE (OXY  IR/ROXICODONE) 5 MG immediate release tablet Take 1 tablet (5 mg total) by mouth every 6 (six) hours as needed for moderate pain. (Patient not taking: Reported on 10/06/2015) 30 tablet 0  . traMADol (ULTRAM) 50 MG tablet Take 1 tablet (50 mg total) by mouth every 6 (six) hours as needed. For pain (Patient not taking: Reported on 10/06/2015) 60 tablet 0   No current facility-administered medications for this visit.    ROS: See HPI for pertinent positives and negatives.   Physical Examination  Filed Vitals:   10/20/15 1626 10/20/15 1627  BP: 123/87 121/86  Pulse: 93 93  Temp: 97 F (36.1 C)   Resp: 18   Height: 6' (1.829 m)   Weight: 210 lb (95.255 kg)   SpO2: 93%    Body mass index is 28.47 kg/(m^2).   General: WDWN in NAD Gait: using walker, left foot drop, slow, deliberate HENT: few remaining teeth are in advanced stages of decay. Eyes: Pupils equal Pulmonary: Mildly labored breathing, little air movement in all fields, no rales, rhonchi,or wheezing Cardiac: RRR, no murmur detected  Abdomen: soft, NT, no masses palpated Skin: no rashes, no ulcers, no cellulitis.  VASCULAR EXAM  Carotid Bruits Right Left   negative negative   Aorta is not palpable Radial pulses are 2+ palpable   VASCULAR EXAM: Extremities without ischemic changes,  without Gangrene; without open wounds.      LE Pulses Right Left   FEMORAL  palpable  palpable    POPLITEAL not palpable  not palpable   POSTERIOR TIBIAL not palpable, no Doppler signal  not palpable, monophasic Doppler signal    DORSALIS PEDIS  ANTERIOR TIBIAL not palpable, monophasic Doppler signal  not palpable, monophasic Doppler signal     Musculoskeletal: no muscle wasting or atrophy; no  peripheral edema. Weak dorsiflexion of left foot. Left foot drop. Neurologic: A&O X 3; monosyllabic responses and grunts to questions,  MOTOR FUNCTION: 5/5 in upper extremities and right LE extremity, 4/5 in left lower extremity, CN 2-12 intact               Non-Invasive Vascular Imaging: DATE: 10/20/2015  Left Lower Extremity arterial Duplex: Left leg bypass graft with no evidence of internal stenosis. 50-74% stenosis in the left mid anterior tibial artery; appears to be due to a change in vessel diameter and partially occlusive disease.  No significant change since the previous exam on 10/22/14.  ABI   R: 0.26 (0.32, 10/22/14), DP: mono, PT: not detected, TBI: not detected  L: 0.64 (0.65), DP: mono, PT: mono, TBI: 0.35   ASSESSMENT: Cutler Patriarca is a 65 y.o. male who is s/p left femoral tibial artery bypass graft on 09/14/2008. He is also s/p left carotid endarterectomy performed on 12/28/2014. He has had  no TIA or stroke since 2011 or 2012.  10/06/15 carotid duplex suggests less than 40% right internal carotid artery stenosis Patent left carotid endarterectomy site with no evidence for restenosis. This is the first carotid duplex s/p left CEA.  No change in the right ICA.   He walks with a walker around his house, does not have claudications sx's, has no signs of ischemia in his feet/legs.  Today's left LE arterial duplex suggests no evidence of internal stenosis in the left leg bypass graft. 50-74% stenosis in the left mid anterior tibial artery; appears to be due to a change in vessel diameter and partially occlusive disease.  No significant change since the previous exam on 10/22/14. ABI's remain stable over the last year: critically decreased arterial perfusion in the right and moderate decrease in the left with no tissue loss.  Dr. Oneida Alar assessment on 11/17/14 re pt's PAD: His bypass in the left leg is patent. His ABI on that side is also reasonable. Although  his ABI has declined on the right side he is asymptomatic. I would not consider any intervention in the right leg unless he has evidence of tissue loss or rest pain. He is overall fairly debilitated and I do not believe he needs an intervention based on ABI findings alone.   PLAN:  Based on the patient's vascular studies and examination, and after discussing with Dr. Oneida Alar, pt will return to clinic in 6 months with ABI's and carotid duplex.  I discussed in depth with the patient the nature of atherosclerosis, and emphasized the importance of maximal medical management including strict control of blood pressure, blood glucose, and lipid levels, obtaining regular exercise, and continued cessation of smoking.  The patient is aware that without maximal medical management the underlying atherosclerotic disease process will progress, limiting the benefit of any interventions.  The patient was given information about PAD including signs, symptoms, treatment, what symptoms should prompt the patient to seek immediate medical care, and risk reduction measures to take.  Clemon Chambers, RN, MSN, FNP-C [facility] Phone: [phone]  Clinic MD: Oneida Alar  10/20/2015 4:27 PM

## 2015-10-21 NOTE — Addendum Note (Signed)
Addended by: Dorthula Rue L on: 10/21/2015 09:53 AM   Modules accepted: Orders

## 2015-10-24 ENCOUNTER — Encounter (HOSPITAL_COMMUNITY): Payer: Medicare Other

## 2015-10-27 ENCOUNTER — Ambulatory Visit: Payer: Medicare Other | Admitting: Family

## 2015-10-28 ENCOUNTER — Ambulatory Visit: Payer: Medicare Other | Admitting: Family

## 2016-03-30 ENCOUNTER — Encounter: Payer: Self-pay | Admitting: Family

## 2016-04-05 ENCOUNTER — Ambulatory Visit (HOSPITAL_COMMUNITY): Admission: RE | Admit: 2016-04-05 | Payer: Medicare Other | Source: Ambulatory Visit

## 2016-04-05 ENCOUNTER — Ambulatory Visit: Payer: Medicare Other | Admitting: Family

## 2016-04-05 ENCOUNTER — Ambulatory Visit (HOSPITAL_COMMUNITY): Payer: Medicare Other

## 2016-05-11 ENCOUNTER — Encounter: Payer: Self-pay | Admitting: Family

## 2016-05-17 ENCOUNTER — Encounter (HOSPITAL_COMMUNITY): Payer: Medicare Other

## 2016-05-17 ENCOUNTER — Ambulatory Visit: Payer: Medicare Other | Admitting: Family

## 2016-06-01 ENCOUNTER — Inpatient Hospital Stay (HOSPITAL_COMMUNITY): Admission: RE | Admit: 2016-06-01 | Payer: Medicare Other | Source: Ambulatory Visit

## 2016-06-07 ENCOUNTER — Ambulatory Visit: Payer: Medicare Other | Admitting: Family

## 2016-07-13 ENCOUNTER — Encounter: Payer: Self-pay | Admitting: Family

## 2016-07-17 ENCOUNTER — Inpatient Hospital Stay (HOSPITAL_COMMUNITY): Admission: RE | Admit: 2016-07-17 | Payer: Medicare Other | Source: Ambulatory Visit

## 2016-07-17 ENCOUNTER — Encounter (HOSPITAL_COMMUNITY): Payer: Medicare Other

## 2016-07-19 ENCOUNTER — Ambulatory Visit: Payer: Medicare Other | Admitting: Family

## 2016-08-15 ENCOUNTER — Ambulatory Visit: Payer: Medicare Other | Attending: Specialist | Admitting: Physical Therapy

## 2016-09-28 ENCOUNTER — Encounter (HOSPITAL_COMMUNITY): Payer: Medicare Other

## 2016-09-28 ENCOUNTER — Ambulatory Visit: Payer: Medicare Other | Admitting: Family

## 2016-10-05 ENCOUNTER — Encounter: Payer: Self-pay | Admitting: Cardiology

## 2016-10-12 ENCOUNTER — Encounter: Payer: Self-pay | Admitting: Family

## 2016-10-15 ENCOUNTER — Encounter: Payer: Self-pay | Admitting: Family

## 2016-10-22 ENCOUNTER — Ambulatory Visit: Payer: Medicare Other | Admitting: Family

## 2016-10-22 ENCOUNTER — Encounter (HOSPITAL_COMMUNITY): Payer: Medicare Other

## 2016-11-21 ENCOUNTER — Ambulatory Visit: Payer: Medicare Other | Attending: Specialist | Admitting: Physical Therapy

## 2016-12-27 ENCOUNTER — Encounter: Payer: Self-pay | Admitting: Family

## 2017-01-03 ENCOUNTER — Encounter (HOSPITAL_COMMUNITY): Payer: Medicare Other

## 2017-01-03 ENCOUNTER — Ambulatory Visit: Payer: Medicare Other | Admitting: Family

## 2017-01-08 ENCOUNTER — Ambulatory Visit: Payer: Medicare Other | Admitting: Family

## 2018-02-19 DIAGNOSIS — H9113 Presbycusis, bilateral: Secondary | ICD-10-CM | POA: Insufficient documentation

## 2018-02-19 DIAGNOSIS — R94120 Abnormal auditory function study: Secondary | ICD-10-CM | POA: Insufficient documentation

## 2018-04-08 ENCOUNTER — Encounter: Payer: Self-pay | Admitting: Neurology

## 2018-06-03 ENCOUNTER — Other Ambulatory Visit: Payer: Self-pay

## 2018-06-03 ENCOUNTER — Ambulatory Visit (INDEPENDENT_AMBULATORY_CARE_PROVIDER_SITE_OTHER): Payer: Medicare Other | Admitting: Neurology

## 2018-06-03 ENCOUNTER — Other Ambulatory Visit (INDEPENDENT_AMBULATORY_CARE_PROVIDER_SITE_OTHER): Payer: Medicare Other

## 2018-06-03 ENCOUNTER — Encounter: Payer: Self-pay | Admitting: Neurology

## 2018-06-03 VITALS — BP 108/64 | HR 53

## 2018-06-03 DIAGNOSIS — M21372 Foot drop, left foot: Secondary | ICD-10-CM

## 2018-06-03 DIAGNOSIS — R413 Other amnesia: Secondary | ICD-10-CM

## 2018-06-03 DIAGNOSIS — F0391 Unspecified dementia with behavioral disturbance: Secondary | ICD-10-CM

## 2018-06-03 DIAGNOSIS — F03B18 Unspecified dementia, moderate, with other behavioral disturbance: Secondary | ICD-10-CM

## 2018-06-03 LAB — TSH: TSH: 3.53 mIU/L (ref 0.40–4.50)

## 2018-06-03 LAB — VITAMIN B12: Vitamin B-12: 343 pg/mL (ref 200–1100)

## 2018-06-03 NOTE — Patient Instructions (Addendum)
1. Schedule head CT without contrast  We have sent a referral to [facility] for your CT and they will call you directly to schedule your appt. They are located at [place]. If you need to contact them directly please call [phone].   2. Bloodwork for TSH, B12  Your provider requests that you have LABS drawn today.  We share a lab with [facility] - they are located in [address] (second floor) of this building.  Once you get there, please have a seat and the phlebotomist will call your name.  If you have waited more than 15 minutes, please advise the front desk  3. Let us know if you would like to start medication to help slow down memory loss, Donepezil 10mg : take 1/2 tablet daily for 2 weeks, then increase to 1 tablet daily 4. Use walker at all times 5. Follow-up in 8 months or so, call for any changes  FALL PRECAUTIONS: Be cautious when walking. Scan the area for obstacles that may increase the risk of trips and falls. When getting up in the mornings, sit up at the edge of the bed for a few minutes before getting out of bed. Consider elevating the bed at the head end to avoid drop of blood pressure when getting up. Walk always in a well-lit room (use night lights in the walls). Avoid area rugs or power cords from appliances in the middle of the walkways. Use a walker or a cane if necessary and consider physical therapy for balance exercise. Get your eyesight checked regularly.  HOME SAFETY: Consider the safety of the kitchen when operating appliances like stoves, microwave oven, and blender. Consider having supervision and share cooking responsibilities until no longer able to participate in those. Accidents with firearms and other hazards in the house should be identified and addressed as well.  ABILITY TO BE LEFT ALONE: If patient is unable to contact 911 operator, consider using LifeLine, or when the need is there, arrange for someone to stay with patients.  Smoking is a fire hazard, consider supervision or cessation. Risk of wandering should be assessed by caregiver and if detected at any point, supervision and safe proof recommendations should be instituted.  RECOMMENDATIONS FOR ALL PATIENTS WITH MEMORY PROBLEMS: 1. Continue to exercise (Recommend 30 minutes of walking everyday, or 3 hours every week) 2. Increase social interactions - continue going to [place] and enjoy social gatherings with friends and family 3. Eat healthy, avoid fried foods and eat more fruits and vegetables 4. Maintain adequate blood pressure, blood sugar, and blood cholesterol level. Reducing the risk of stroke and cardiovascular disease also helps promoting better memory. 5. Avoid stressful situations. Live a simple life and avoid aggravations. Organize your time and prepare for the next day in anticipation. 6. Sleep well, avoid any interruptions of sleep and avoid any distractions in the bedroom that may interfere with adequate sleep quality 7. Avoid sugar, avoid sweets as there is a strong link between excessive sugar intake, diabetes, and cognitive impairment The Mediterranean diet has been shown to help patients reduce the risk of progressive memory disorders and reduces cardiovascular risk. This includes eating fish, eat fruits and green leafy vegetables, nuts like almonds and hazelnuts, walnuts, and also use olive oil. Avoid fast foods and fried foods as much as possible. Avoid sweets and sugar as sugar use has been linked to worsening of memory function.  There is always a concern of gradual progression of memory problems. If this  is the case, then we may need to adjust level of care according to patient needs. Support, both to the patient and caregiver, should then be put into place.

## 2018-06-03 NOTE — Progress Notes (Signed)
NEUROLOGY CONSULTATION NOTE  [name] MRN: [id number] DOB: May 27, 1951  Referring provider: Dr. Benito [name] Primary care provider: Dr. Benito [name]  Reason for consult:  dementia  Dear Dr [name]:  Thank you for your kind referral of [name] for consultation of the above symptoms. Although his history is well known to you, please allow me to reiterate it for the purpose of our medical record. The patient was accompanied to the clinic by his niece [name] who also provides collateral information. Records and images were personally reviewed where available.  HISTORY OF PRESENT ILLNESS: This is a pleasant 67 year old right-handed man with a history of hypertension, hyperlipidemia, CAD s/p MI, paroxysmal atrial fibrillation, history of substance abuse and alcoholism, presenting for evaluation of dementia. The patient is a poor historian and mostly laughs inappropriately during the visit. He is able to answer simple questions and follow commands, but cannot provide any history. His niece [name] does not know much of his history until he moved in with her last May 2019. She states that he was living with his long-term girlfriend for 20 years and she took care of him until she got sick. [name] started noticing memory changes for at least 10-15 years. He is not a big talker, but she noticed forgetfulness and needing constant reminders. She has never seen him drive. His girlfriend was managing his medications and finances, which [name] has taken over. [name] reports he had a heart attack in 1993 or 1995 and was drinking alcohol heavily until then. He has had no alcohol since moving in with her. She does not recall any inciting event for memory changes. He needs help with dressing and bathing. No paranoia or hallucinations, he is laughing most of the time. He denies any headaches, dizziness, diplopia, dysarthria, dysphagia, neck/back pain, focal numbness/tingling/weakness,  bowel/bladder dysfunction. No anosmia, tremors, no recent falls. His right leg sometimes buckles, he always uses a walker. He denies any significant head injuries. No family history of dementia. Sleep is good. No paranoia or hallucinations.    PAST MEDICAL HISTORY: Past Medical History:  Diagnosis Date  . Coronary artery disease   . Dementia ([place])   . Hyperlipidemia   . Hypertension   . Paroxysmal atrial fibrillation (HCC)     PAST SURGICAL HISTORY: History reviewed. No pertinent surgical history.  MEDICATIONS: Current Outpatient Medications on File Prior to Visit  Medication Sig Dispense Refill  . aspirin 81 MG tablet Take 81 mg by mouth daily.    [name] atorvastatin (LIPITOR) 20 MG tablet Take 20 mg by mouth daily.    . folic acid (FOLVITE) 0.5 MG tablet Take 0.5 mg by mouth daily.    [name] lisinopril-hydrochlorothiazide (PRINZIDE,ZESTORETIC) 20-12.5 MG tablet Take 1 tablet by mouth daily.    . metoprolol tartrate (LOPRESSOR) 25 MG tablet Take 25 mg by mouth 2 (two) times daily.     No current facility-administered medications on file prior to visit.     ALLERGIES: No Known Allergies  FAMILY HISTORY: History reviewed. No pertinent family history.  SOCIAL HISTORY: Social History   Socioeconomic History  . Marital status: Single    Spouse name: Not on file  . Number of children: Not on file  . Years of education: Not on file  . Highest education level: Not on file  Occupational History  . Not on file  Social Needs  . Financial resource strain: Not on file  . Food insecurity:    Worry: Not on file  Inability: Not on file  . Transportation needs:    Medical: Not on file    Non-medical: Not on file  Tobacco Use  . Smoking status: Not on file  Substance and Sexual Activity  . Alcohol use: Not on file  . Drug use: Not on file  . Sexual activity: Not on file  Lifestyle  . Physical activity:    Days per week: Not on file    Minutes per session: Not on file  . Stress:  Not on file  Relationships  . Social connections:    Talks on phone: Not on file    Gets together: Not on file    Attends religious service: Not on file    Active member of club or organization: Not on file    Attends meetings of clubs or organizations: Not on file    Relationship status: Not on file  . Intimate partner violence:    Fear of current or ex partner: Not on file    Emotionally abused: Not on file    Physically abused: Not on file    Forced sexual activity: Not on file  Other Topics Concern  . Not on file  Social History Narrative  . Not on file    REVIEW OF SYSTEMS: Constitutional: No fevers, chills, or sweats, no generalized fatigue, change in appetite Eyes: No visual changes, double vision, eye pain Ear, nose and throat: No hearing loss, ear pain, nasal congestion, sore throat Cardiovascular: No chest pain, palpitations Respiratory:  No shortness of breath at rest or with exertion, wheezes GastrointestinaI: No nausea, vomiting, diarrhea, abdominal pain, fecal incontinence Genitourinary:  No dysuria, urinary retention or frequency Musculoskeletal:  No neck pain, back pain Integumentary: No rash, pruritus, skin lesions Neurological: as above Psychiatric: No depression, insomnia, anxiety Endocrine: No palpitations, fatigue, diaphoresis, mood swings, change in appetite, change in weight, increased thirst Hematologic/Lymphatic:  No anemia, purpura, petechiae. Allergic/Immunologic: no itchy/runny eyes, nasal congestion, recent allergic reactions, rashes  PHYSICAL EXAM: Vitals:   06/03/18 1414  BP: 108/64  Pulse: (!) 53  SpO2: 96%   General: No acute distress, poor dentition, sitting on wheelchair Head:  Normocephalic/atraumatic, there appears to be an skull indentation on the right temporal region.  Eyes: Fundoscopic exam shows bilateral sharp discs, no vessel changes, exudates, or hemorrhages Neck: supple, no paraspinal tenderness, full range of motion Back:  No paraspinal tenderness Heart: regular rate and rhythm Lungs: Clear to auscultation bilaterally. Vascular: No carotid bruits. Skin/Extremities: No rash, no edema Neurological Exam: Mental status: alert and oriented to person, place, no dysarthria or aphasia, laughs inappropriately several times during the visit. Fund of knowledge is reduced.  Recent and remote memory are impaired.  Attention and concentration are reduced.    Able to name objects and repeat phrases. CDT 1/5 MMSE - Mini Mental State Exam 06/03/2018  Orientation to time 0  Orientation to Place 5  Registration 3  Attention/ Calculation 0  Recall 0  Language- name 2 objects 2  Language- repeat 1  Language- follow 3 step command 3  Language- read & follow direction 1  Write a sentence 1  Copy design 0  Total score 16   Cranial nerves: CN I: not tested CN II: pupils equal, round and reactive to light, visual fields intact, fundi unremarkable. CN III, IV, VI:  full range of motion, no nystagmus, no ptosis CN V: facial sensation intact CN VII: shallow left nasolabial fold CN VIII: hearing intact to finger rub CN IX, X:  gag intact, uvula midline CN XI: sternocleidomastoid and trapezius muscles intact CN XII: tongue midline Bulk & Tone: normal, no fasciculations. Motor: 5/5 on right UE and LE, 5/5 left UE and proximal left LE, 0/5 left foot dorsiflexion, plantarflexion, eversion/inversion, no pronator drift. Sensation: intact to light touch, cold, pin, vibration and joint position sense.  No extinction to double simultaneous stimulation.   Deep Tendon Reflexes: brisk +3 on left UE and LE, +2 on right UE and LE, no ankle clonus Plantar responses: downgoing bilaterally Cerebellar: no incoordination on finger to nose testing Gait: not tested, sitting on wheelchair, always uses walker for ambulation, did not bring walker today Tremor: none  IMPRESSION: This is a 67 year old right-handed man with a history of hypertension,  hyperlipidemia, CAD s/p MI, paroxysmal atrial fibrillation, history of substance abuse and alcoholism, presenting for evaluation of dementia. His neurological exam shows a left foot drop (chronic per patient and niece), brisk reflexes on left, MMSE today 16/30, indicating moderate dementia with behavioral changes (inappropriate laughing). His niece reports memory changes for the past 10-15 years, etiology unclear, there is a history of alcoholism, may be due to alcohol-related dementia, however with focal findings, head CT without contrast will be ordered to assess for underlying structural abnormality. Check TSH and B12. We discussed medications for dementia, including side effects and expectations, with prolonged history there may not be much benefit to this, but his niece would like to discuss this with the patient. Use walker at all times. He has 24/7 care at home, resources given to his niece if further help is needed. He does not drive. Follow-up in 8 months, she knows to call for any changes.   Thank you for allowing me to participate in the care of this patient. Please do not hesitate to call for any questions or concerns.   Ellouise Newer, M.D.  CC: Dr. Vista [name]

## 2018-06-04 ENCOUNTER — Telehealth: Payer: Self-pay

## 2018-06-04 NOTE — Telephone Encounter (Signed)
Spoke with pt's niece, [name], relaying message below.

## 2018-06-04 NOTE — Telephone Encounter (Signed)
-----   Message from Cameron Sprang, MD sent at 06/04/2018  9:03 AM EDT ----- Pls let niece know thyroid and B12 levels are normal. Thanks

## 2018-06-10 ENCOUNTER — Other Ambulatory Visit: Payer: Medicare Other

## 2018-06-13 ENCOUNTER — Encounter: Payer: Self-pay | Admitting: Gastroenterology

## 2018-06-18 ENCOUNTER — Other Ambulatory Visit: Payer: Medicare Other

## 2018-06-18 ENCOUNTER — Ambulatory Visit
Admission: RE | Admit: 2018-06-18 | Discharge: 2018-06-18 | Disposition: A | Discharge status: Home / Self Care | Payer: Medicare Other | Source: Ambulatory Visit | Attending: Neurology | Admitting: Neurology

## 2018-06-18 DIAGNOSIS — R413 Other amnesia: Secondary | ICD-10-CM

## 2018-06-19 ENCOUNTER — Telehealth: Payer: Self-pay

## 2018-06-19 NOTE — Telephone Encounter (Signed)
LMOM for [name], pt's niece, relaying message below.

## 2018-06-19 NOTE — Telephone Encounter (Signed)
-----   Message from Cameron Sprang, MD sent at 06/19/2018 10:08 AM EDT ----- Pls let [name] know that patient's head CT did not show any evidence of tumor or bleed, it showed old strokes similar to prior studies. Thanks

## 2018-07-15 ENCOUNTER — Encounter: Payer: Self-pay | Admitting: Gastroenterology

## 2018-07-15 ENCOUNTER — Ambulatory Visit (INDEPENDENT_AMBULATORY_CARE_PROVIDER_SITE_OTHER): Payer: Medicare Other | Admitting: Gastroenterology

## 2018-07-15 ENCOUNTER — Encounter (INDEPENDENT_AMBULATORY_CARE_PROVIDER_SITE_OTHER): Payer: Self-pay

## 2018-07-15 VITALS — BP 108/64 | HR 74 | Wt 200.1 lb

## 2018-07-15 DIAGNOSIS — Z1211 Encounter for screening for malignant neoplasm of colon: Secondary | ICD-10-CM

## 2018-07-15 DIAGNOSIS — Z7901 Long term (current) use of anticoagulants: Secondary | ICD-10-CM

## 2018-07-15 MED ORDER — NA SULFATE-K SULFATE-MG SULF 17.5-3.13-1.6 GM/177ML PO SOLN: 1.0000 | ORAL | 0 refills | Status: DC

## 2018-07-15 NOTE — Patient Instructions (Signed)
Tips for colonoscopy:  -STAY WELL HYDRATED FOR 3-4 DAYS PRIOR TO THE EXAM. This reduces nausea and dehydration.  -TO PREVENT SKIN/HEMORRHOID IRRITATION- prior to wiping, put A&Dointment or vaseline on the toilet paper. -Keep a towel or pad on the bed.  -DRINK 64oz of clear liquids in the morning of prep day (PRIOR TO STARTING THE PREP) to be sure that there is enough fluid to flush the colon and stay hydrated!!!! This is in addition to the fluids required for preparation.  You will be contacted by our office prior to your procedure for directions on holding your Eliquis.  If you do not hear from our office 1 week prior to your scheduled procedure, please call [phone] to discuss.   You have been scheduled for a colonoscopy. Please follow written instructions given to you at your visit today.  Please pick up your prep supplies at the pharmacy within the next 1-3 days. If you use inhalers (even only as needed), please bring them with you on the day of your procedure. Your physician has requested that you go to www.startemmi.com and enter the access code given to you at your visit today. This web site gives a general overview about your procedure. However, you should still follow specific instructions given to you by our office regarding your preparation for the procedure.

## 2018-07-15 NOTE — Progress Notes (Signed)
Referring Provider: [name], MD Primary Care Physician:  [name], MD   Reason for Consultation:  Need for colon cancer screening   IMPRESSION:  Need for colon cancer screening Chronic use of Eliquis Possible hemoccult positive on testing by health insurance company No prior colon cancer screening  Discussed screening options such as guaiac cards, FIT testing, Cologuard, and colonoscopy. The patient and his niece would prefer a colonoscopy.   I have recommended holding Eliquis for 2 days before endoscopy.  I discussed with the patient that there is a low, but real, risk of a cardiovascular event such as heart attack, stroke, or embolism/thrombosis while off Eliquis. Will communicate by phone or EMR with patient's prescribing provider to confirm that holding the Eliquis is appropriate at this time.    PLAN: Obtain results from [place] regarding stool studies Colonoscopy off Eliquis if he is cleared by his prescribing physician  I consented the patient at the bedside today discussing the risks, benefits, and alternatives to endoscopic evaluation. In particular, we discussed the risks that include, but are not limited to, reaction to medication, cardiopulmonary compromise, bleeding requiring blood transfusion, aspiration resulting in pneumonia, perforation requiring surgery, and even death. We reviewed the risk of missed lesion including polyps or even cancer. The patient acknowledges these risks and asks that we proceed.   HPI: [name] is a 67 y.o. male with a history of hypertension, hyperlipidemia, CAD s/p MI, paroxysmal atrial fibrillation, history of substance abuse and alcoholism. He is on chronic Eliquis. The patient is a poor historian and mostly laughs inappropriately during the visit. He is able to answer simple questions and follow commands, but cannot provide any history. His niece [name] does not know much of his history until he moved in with her  last May 2019. She states that he was living with his long-term girlfriend for 20 years and she took care of him until she got sick.  Referred now to discuss colon cancer screening. GI ROS is negative.  [employer] came to the house with a stool card for colon cancer screening earlier this year. They are unaware of the results. No prior colon cancer screening.   No known family history of colon cancer or polyps.    Past Medical History:  Diagnosis Date  . Coronary artery disease   . Dementia ([place])   . Hyperlipidemia   . Hypertension   . Paroxysmal atrial fibrillation ([facility])     No past surgical history on file.  Current Outpatient Medications  Medication Sig Dispense Refill  . amiodarone (PACERONE) 200 MG tablet Take 200 mg by mouth 2 (two) times daily.  3  . aspirin 81 MG tablet Take 81 mg by mouth daily.    [name] atorvastatin (LIPITOR) 20 MG tablet Take 20 mg by mouth daily.    [name] ELIQUIS 5 MG TABS tablet TAKE 1 (ONE) TABLET TWICE DAILY  2  . folic acid (FOLVITE) 0.5 MG tablet Take 0.5 mg by mouth daily.    [name] lisinopril-hydrochlorothiazide (PRINZIDE,ZESTORETIC) 20-12.5 MG tablet Take 1 tablet by mouth daily.    . metoprolol tartrate (LOPRESSOR) 25 MG tablet Take 25 mg by mouth 2 (two) times daily.     No current facility-administered medications for this visit.     Allergies as of 07/15/2018  . (No Known Allergies)    No family history on file.  Social History   Socioeconomic History  . Marital status: Single    Spouse name: Not on file  .  Number of children: Not on file  . Years of education: Not on file  . Highest education level: Not on file  Occupational History  . Not on file  Social Needs  . Financial resource strain: Not on file  . Food insecurity:    Worry: Not on file    Inability: Not on file  . Transportation needs:    Medical: Not on file    Non-medical: Not on file  Tobacco Use  . Smoking status: Former Research scientist (life sciences)  . Smokeless tobacco: Never Used  Substance  and Sexual Activity  . Alcohol use: Not Currently  . Drug use: Never  . Sexual activity: Not Currently  Lifestyle  . Physical activity:    Days per week: Not on file    Minutes per session: Not on file  . Stress: Not on file  Relationships  . Social connections:    Talks on phone: Not on file    Gets together: Not on file    Attends religious service: Not on file    Active member of club or organization: Not on file    Attends meetings of clubs or organizations: Not on file    Relationship status: Not on file  . Intimate partner violence:    Fear of current or ex partner: Not on file    Emotionally abused: Not on file    Physically abused: Not on file    Forced sexual activity: Not on file  Other Topics Concern  . Not on file  Social History Narrative   Pt lives in single story home with his niece and her significant other   Has 2 children   Pt unsure of level of education - knows that he started high school and that he did not finish.   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     Review of Systems: 12 system ROS is negative except as noted above.   Physical Exam: Vital signs were reviewed. General:   Alert, well-nourished, pleasant and cooperative in NAD Head:  Normocephalic and atraumatic. Eyes:  Sclera clear, no icterus.   Conjunctiva pink. Mouth:  No deformity or lesions.   Neck:  Supple; no thyromegaly. Lungs:  Clear throughout to auscultation.   No wheezes.  Heart:  Regular rate and rhythm; no murmurs Abdomen:  Soft, nontender, normal bowel sounds. No rebound or guarding. No hepatosplenomegaly Rectal:  Deferred  Msk:  Symmetrical without gross deformities. Extremities:  No gross deformities or edema. Neurologic:  Alert and  oriented x4;  grossly nonfocal Skin:  No rash or bruise. Psych:  Alert and cooperative. Normal mood and affect.   Anshu Wehner L. Tarri Glenn Md, MPH [facility] 07/15/2018, 2:51 PM

## 2018-07-16 ENCOUNTER — Encounter: Payer: Self-pay | Admitting: Gastroenterology

## 2018-08-18 ENCOUNTER — Encounter: Payer: Medicare Other | Admitting: Gastroenterology

## 2019-02-04 ENCOUNTER — Encounter: Payer: Self-pay | Admitting: Neurology

## 2019-02-09 ENCOUNTER — Ambulatory Visit: Payer: Medicare Other | Admitting: Neurology

## 2019-02-10 ENCOUNTER — Other Ambulatory Visit: Payer: Self-pay

## 2019-02-10 ENCOUNTER — Telehealth (INDEPENDENT_AMBULATORY_CARE_PROVIDER_SITE_OTHER): Payer: Medicare Other | Admitting: Neurology

## 2019-02-10 DIAGNOSIS — F0390 Unspecified dementia without behavioral disturbance: Secondary | ICD-10-CM | POA: Insufficient documentation

## 2019-02-10 DIAGNOSIS — F0151 Vascular dementia with behavioral disturbance: Secondary | ICD-10-CM

## 2019-02-10 DIAGNOSIS — F01518 Vascular dementia, unspecified severity, with other behavioral disturbance: Secondary | ICD-10-CM

## 2019-02-10 NOTE — Progress Notes (Signed)
Virtual Visit via Video Note The purpose of this virtual visit is to provide medical care while limiting exposure to the novel coronavirus.    Consent was obtained for video visit:  Yes.   Answered questions that patient had about telehealth interaction:  Yes.   I discussed the limitations, risks, security and privacy concerns of performing an evaluation and management service by telemedicine. I also discussed with the patient that there may be a patient responsible charge related to this service. The patient expressed understanding and agreed to proceed.  Pt location: Home Physician Location: office Name of referring provider:  [name], MD I connected with [name] at patients initiation/request on 02/10/2019 at  4:00 PM EDT by video enabled telemedicine application and verified that I am speaking with the correct person using two identifiers. Pt MRN:  [id number] Pt DOB:  1951/03/02 Video Participants:  [name];  [name] (niece)   History of Present Illness:  The patient was last seen in October 2019 for moderate dementia with behavioral disturbance. His niece [name] is present during this e-visit to provide additional information. MMSE 16/30 in October 2019. I personally reviewed head CT without contrast which did not show any acute changes, there was advanced chronic microvascular disease and chronic left parietal lobe and anterior left superior frontal gyrus encephalomalacia, mild diffuse volume loss. [name] reports memory continues to decline since last visit. She has to make him go to the bathroom, if she is not at home, he would soil himself. He needs help with dressing and bathing. He ambulates with a walker, no wandering behavior. Sleep is good. No paranoia or hallucinations. He fell in the bathroom last Saturday, she had to help him up. He refuses to get up and walk/exercise.   History on Initial Assessment 06/03/2018: This is a pleasant 68 year old  right-handed man with a history of hypertension, hyperlipidemia, CAD s/p MI, paroxysmal atrial fibrillation, history of substance abuse and alcoholism, presenting for evaluation of dementia. The patient is a poor historian and mostly laughs inappropriately during the visit. He is able to answer simple questions and follow commands, but cannot provide any history. His niece [name] does not know much of his history until he moved in with her last May 2019. She states that he was living with his long-term girlfriend for 20 years and she took care of him until she got sick. [name] started noticing memory changes for at least 10-15 years. He is not a big talker, but she noticed forgetfulness and needing constant reminders. She has never seen him drive. His girlfriend was managing his medications and finances, which [name] has taken over. [name] reports he had a heart attack in 1993 or 1995 and was drinking alcohol heavily until then. He has had no alcohol since moving in with her. She does not recall any inciting event for memory changes. He needs help with dressing and bathing. No paranoia or hallucinations, he is laughing most of the time. He denies any headaches, dizziness, diplopia, dysarthria, dysphagia, neck/back pain, focal numbness/tingling/weakness, bowel/bladder dysfunction. No anosmia, tremors, no recent falls. His right leg sometimes buckles, he always uses a walker. He denies any significant head injuries. No family history of dementia. Sleep is good. No paranoia or hallucinations.      Current Outpatient Medications on File Prior to Visit  Medication Sig Dispense Refill  . amiodarone (PACERONE) 200 MG tablet Take 200 mg by mouth 2 (two) times daily.  3  . aspirin 81 MG  tablet Take 81 mg by mouth daily.    [name] atorvastatin (LIPITOR) 40 MG tablet Take 40 mg by mouth daily.    [name] ELIQUIS 5 MG TABS tablet TAKE 1 (ONE) TABLET TWICE DAILY  2  . folic acid (FOLVITE) 1 MG tablet Take 1 mg by mouth daily.      [name] lisinopril-hydrochlorothiazide (PRINZIDE,ZESTORETIC) 20-12.5 MG tablet Take 1 tablet by mouth daily.    . metoprolol tartrate (LOPRESSOR) 25 MG tablet Take 25 mg by mouth 2 (two) times daily.     No current facility-administered medications on file prior to visit.      Observations/Objective:   Vitals:   02/10/19 1521  Weight: 200 lb (90.7 kg)  Height: 5\' 11"  (1.803 m)   GEN:  The patient appears stated age and is in NAD. Poor dentition  Neurological examination: Patient is awake, alert, oriented to person, place. No aphasia, mild dysarthria. Reduced fluency, able to follow simple commands. Remote and recent memory impaired. Able to name, difficulty with repetition. Cranial nerves: Extraocular movements intact except for weakness of left medial rectus, no nystagmus. No facial asymmetry. Motor: Moves both upper extremities symmetrically, at least anti-gravity, ambulates with walker, he has a left foot drop (chronic), slow and cautious with walker. No incoordination on finger to nose testing.  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 MMSE today 10/30. We again discussed medications for dementia and low potential benefit at this point, continue with 24/7 supervision. [name] is  needing more assistance at home and is interested in [facility]. Resources will be provided for them. He does not drive. Follow-up in 6 months, call for any changes.   Follow Up Instructions:   -I discussed the assessment and treatment plan with the patient/POA. The patient/POA were provided an opportunity to ask questions and all were answered. The patient/POA agreed with the plan and demonstrated an understanding of the instructions.   The patient/POA was advised to call back or seek an in-person evaluation if the symptoms worsen or if the condition fails to improve as anticipated.   Cameron Sprang, MD

## 2019-02-19 ENCOUNTER — Other Ambulatory Visit: Payer: Self-pay | Admitting: Cardiology

## 2019-02-19 ENCOUNTER — Telehealth: Payer: Self-pay

## 2019-02-19 MED ORDER — AMIODARONE HCL 200 MG PO TABS: 200.0000 mg | ORAL_TABLET | Freq: Two times a day (BID) | ORAL | 3 refills | Status: DC

## 2019-02-19 NOTE — Telephone Encounter (Signed)
Pt called to request medication refill for Amiodarone 200mg 

## 2019-02-19 NOTE — Telephone Encounter (Signed)
Done

## 2019-02-20 ENCOUNTER — Emergency Department (HOSPITAL_COMMUNITY): Payer: Medicare Other

## 2019-02-20 ENCOUNTER — Other Ambulatory Visit: Payer: Self-pay

## 2019-02-20 ENCOUNTER — Emergency Department (HOSPITAL_COMMUNITY)
Admission: EM | Admit: 2019-02-20 | Discharge: 2019-02-20 | Disposition: A | Discharge status: Home / Self Care | Payer: Medicare Other | Attending: Emergency Medicine | Admitting: Emergency Medicine

## 2019-02-20 DIAGNOSIS — Z8673 Personal history of transient ischemic attack (TIA), and cerebral infarction without residual deficits: Secondary | ICD-10-CM | POA: Diagnosis not present

## 2019-02-20 DIAGNOSIS — Y939 Activity, unspecified: Secondary | ICD-10-CM | POA: Insufficient documentation

## 2019-02-20 DIAGNOSIS — Z7982 Long term (current) use of aspirin: Secondary | ICD-10-CM | POA: Insufficient documentation

## 2019-02-20 DIAGNOSIS — Z7901 Long term (current) use of anticoagulants: Secondary | ICD-10-CM | POA: Insufficient documentation

## 2019-02-20 DIAGNOSIS — I10 Essential (primary) hypertension: Secondary | ICD-10-CM | POA: Diagnosis not present

## 2019-02-20 DIAGNOSIS — Y998 Other external cause status: Secondary | ICD-10-CM | POA: Diagnosis not present

## 2019-02-20 DIAGNOSIS — Z79899 Other long term (current) drug therapy: Secondary | ICD-10-CM | POA: Diagnosis not present

## 2019-02-20 DIAGNOSIS — Y92012 Bathroom of single-family (private) house as the place of occurrence of the external cause: Secondary | ICD-10-CM | POA: Diagnosis not present

## 2019-02-20 DIAGNOSIS — Z87891 Personal history of nicotine dependence: Secondary | ICD-10-CM | POA: Diagnosis not present

## 2019-02-20 DIAGNOSIS — I2510 Atherosclerotic heart disease of native coronary artery without angina pectoris: Secondary | ICD-10-CM | POA: Diagnosis not present

## 2019-02-20 DIAGNOSIS — W19XXXA Unspecified fall, initial encounter: Secondary | ICD-10-CM | POA: Diagnosis not present

## 2019-02-20 DIAGNOSIS — S93601A Unspecified sprain of right foot, initial encounter: Secondary | ICD-10-CM | POA: Diagnosis not present

## 2019-02-20 DIAGNOSIS — F0390 Unspecified dementia without behavioral disturbance: Secondary | ICD-10-CM | POA: Diagnosis not present

## 2019-02-20 DIAGNOSIS — L89892 Pressure ulcer of other site, stage 2: Secondary | ICD-10-CM | POA: Diagnosis not present

## 2019-02-20 DIAGNOSIS — S99921A Unspecified injury of right foot, initial encounter: Secondary | ICD-10-CM | POA: Diagnosis present

## 2019-02-20 LAB — BASIC METABOLIC PANEL
Anion gap: 7 (ref 5–15)
BUN: 45 mg/dL — ABNORMAL HIGH (ref 8–23)
CO2: 20 mmol/L — ABNORMAL LOW (ref 22–32)
Calcium: 8.9 mg/dL (ref 8.9–10.3)
Chloride: 111 mmol/L (ref 98–111)
Creatinine, Ser: 2.61 mg/dL — ABNORMAL HIGH (ref 0.61–1.24)
GFR calc Af Amer: 28 mL/min — ABNORMAL LOW (ref >60)
GFR calc non Af Amer: 24 mL/min — ABNORMAL LOW (ref >60)
Glucose, Bld: 92 mg/dL (ref 70–99)
Potassium: 5.4 mmol/L — ABNORMAL HIGH (ref 3.5–5.1)
Sodium: 138 mmol/L (ref 135–145)

## 2019-02-20 LAB — CBC WITH DIFFERENTIAL/PLATELET
Abs Immature Granulocytes: 0.07 10*3/uL (ref 0.00–0.07)
Basophils Absolute: 0 10*3/uL (ref 0.0–0.1)
Basophils Relative: 0 %
Eosinophils Absolute: 0.1 10*3/uL (ref 0.0–0.5)
Eosinophils Relative: 1 %
HCT: 43.3 % (ref 39.0–52.0)
Hemoglobin: 13.4 g/dL (ref 13.0–17.0)
Immature Granulocytes: 1 %
Lymphocytes Relative: 30 %
Lymphs Abs: 2.5 10*3/uL (ref 0.7–4.0)
MCH: 26.3 pg (ref 26.0–34.0)
MCHC: 30.9 g/dL (ref 30.0–36.0)
MCV: 85.1 fL (ref 80.0–100.0)
Monocytes Absolute: 0.6 10*3/uL (ref 0.1–1.0)
Monocytes Relative: 7 %
Neutro Abs: 4.9 10*3/uL (ref 1.7–7.7)
Neutrophils Relative %: 61 %
Platelets: 231 10*3/uL (ref 150–400)
RBC: 5.09 MIL/uL (ref 4.22–5.81)
RDW: 15.7 % — ABNORMAL HIGH (ref 11.5–15.5)
WBC: 8.2 10*3/uL (ref 4.0–10.5)
nRBC: 0 % (ref 0.0–0.2)

## 2019-02-20 NOTE — ED Triage Notes (Signed)
Pt brought in by his niece for a fall. Pt reports that he does not remember how he fell but that he fell yesterday. Pt denying any pain at present time. VSS at this time.

## 2019-02-20 NOTE — ED Notes (Signed)
Patient transported to X-ray 

## 2019-02-20 NOTE — Discharge Instructions (Addendum)
You were seen in the ED Today after a fall; xrays of your right foot and ankle were negative for any breaks. Please rest, ice, and elevate the foot for comfort and to decrease the swelling. You may use the ace wrap as well to help with swelling. You may take Tylenol every 6 hours as needed for the pain. Please follow up with your PCP.   You were also found to have a pressure ulcer to your left foot between your big toe and 2nd toe; a wound culture was obtained to check for infection. Please follow up with [facility].

## 2019-02-20 NOTE — ED Notes (Signed)
Pt and niece verbalized understanding of discharge paperwork. Pt discharged home with niece.

## 2019-02-20 NOTE — ED Provider Notes (Signed)
[facility] EMERGENCY DEPARTMENT Provider Note   CSN: [id number] Arrival date & time: 02/20/19  1052    History   Chief Complaint Chief Complaint  Patient presents with  . Fall   LEVEL 5 CAVEAT - DEMENTIA  HPI [name] is a 68 y.o. male with PMHx dementia, HTN, HLD, CAD, a fib, and stroke on Eliquis who presents to the ED today for a fall that occurred yesterday. Pt unable to provide anymore information. Reports he had a fall; not complaining of any pain currently. States his niece brought him to the ED.   Upon calling the niece she states that pt got up from the toilet yesterday and fell; catching himself between the bathtub and the toilet. Niece witnessed the fall; believes patient may have tripped as her bathroom is quite small. No head injury or LOC. She was able to get patient up herself and states that since then he hasn't wanted to put much weight onto his right foot. No other complaints.   The history is provided by the patient and a relative. The history is limited by a developmental delay. No language interpreter was used.         Past Medical History:  Diagnosis Date  . Alcoholism ([place])   . Coronary artery disease   . Dementia ([place])   . Hyperlipidemia   . Hypertension   . Paroxysmal atrial fibrillation (HCC)   . Stroke [facility])     Patient Active Problem List   Diagnosis Date Noted  . Dementia ([place]) 02/10/2019    Past Surgical History:  Procedure Laterality Date  . heart bypass          Home Medications    Prior to Admission medications   Medication Sig Start Date End Date Taking? Authorizing Provider  amiodarone (PACERONE) 200 MG tablet Take 1 tablet (200 mg total) by mouth 2 (two) times daily. 02/19/19   Patwardhan, Reynold Bowen, MD  aspirin 81 MG tablet Take 81 mg by mouth daily.    [provider]  atorvastatin (LIPITOR) 40 MG tablet Take 40 mg by mouth daily.    [provider]  ELIQUIS 5 MG TABS tablet TAKE  1 (ONE) TABLET TWICE DAILY 05/12/18   [provider]  folic acid (FOLVITE) 1 MG tablet Take 1 mg by mouth daily.     [provider]  lisinopril-hydrochlorothiazide (PRINZIDE,ZESTORETIC) 20-12.5 MG tablet Take 1 tablet by mouth daily.    [provider]  metoprolol tartrate (LOPRESSOR) 25 MG tablet Take 25 mg by mouth 2 (two) times daily.    [provider]    Family History Family History  Problem Relation Age of Onset  . Lung cancer Sister   . Heart disease Brother   . Diabetes Sister   . Clotting disorder Sister   . Heart disease Sister   . Colon cancer Neg Hx   . Esophageal cancer Neg Hx     Social History Social History   Tobacco Use  . Smoking status: Former Research scientist (life sciences)  . Smokeless tobacco: Never Used  Substance Use Topics  . Alcohol use: Not Currently  . Drug use: Never     Allergies   Patient has no known allergies.   Review of Systems Review of Systems  Unable to perform ROS: Dementia  Constitutional: Negative for fever.  Musculoskeletal: Positive for arthralgias and joint swelling.  Skin: Positive for wound.  Neurological: Negative for syncope.  Hematological: Bruises/bleeds easily.     Physical  Exam Updated Vital Signs BP 119/65   Pulse (!) 59   Temp 98.2 F (36.8 C) (Oral)   Resp 16   Ht 6\' 2"  (1.88 m)   SpO2 98%   BMI 25.68 kg/m   Physical Exam Vitals signs and nursing note reviewed.  Constitutional:      Appearance: He is not ill-appearing.  HENT:     Head: Normocephalic and atraumatic.  Eyes:     Conjunctiva/sclera: Conjunctivae normal.  Neck:     Musculoskeletal: Neck supple.  Cardiovascular:     Rate and Rhythm: Normal rate and regular rhythm.  Pulmonary:     Effort: Pulmonary effort is normal.     Breath sounds: Normal breath sounds.  Abdominal:     Palpations: Abdomen is soft.     Tenderness: There is no abdominal tenderness. There is no guarding or rebound.  Musculoskeletal:     Comments:  Mild swelling to right foot along dorsum; TTP to dorsum of foot as well as right ankle; ROM limited due to pain; 2+ DP Pulse; cap refill < 2 seconds   No tenderness to right knee or right hip. No tenderness to all other joints.   Pressure ulcer noted between left great toe and 2nd toe; no surrounding erythema or edema  No C, T, or L spine midline tenderness.   Skin:    General: Skin is warm and dry.  Neurological:     Mental Status: He is alert.      ED Treatments / Results  Labs (all labs ordered are listed, but only abnormal results are displayed) Labs Reviewed  BASIC METABOLIC PANEL - Abnormal; Notable for the following components:      Result Value   Potassium 5.4 (*)    CO2 20 (*)    BUN 45 (*)    Creatinine, Ser 2.61 (*)    GFR calc non Af Amer 24 (*)    GFR calc Af Amer 28 (*)    All other components within normal limits  CBC WITH DIFFERENTIAL/PLATELET - Abnormal; Notable for the following components:   RDW 15.7 (*)    All other components within normal limits  AEROBIC CULTURE (SUPERFICIAL SPECIMEN)    EKG None  Radiology Dg Ankle Complete Left  Result Date: 02/20/2019 CLINICAL DATA:  Left ankle pain and swelling following a fall yesterday. EXAM: LEFT ANKLE COMPLETE - 3+ VIEW COMPARISON:  Left foot radiographs obtained at the same time. FINDINGS: Mild diffuse soft tissue swelling. No fracture or dislocation seen. No visible effusion. IMPRESSION: No fracture. Electronically Signed   By: Claudie Revering M.D.   On: 02/20/2019 12:08   Dg Ankle Complete Right  Result Date: 02/20/2019 CLINICAL DATA:  Right foot and ankle pain. EXAM: RIGHT ANKLE - COMPLETE 3+ VIEW COMPARISON:  None. FINDINGS: The mineralization and alignment are normal. There is no evidence of acute fracture or dislocation. The joint spaces are preserved. There is moderate lateral soft tissue swelling. Scattered vascular calcifications are noted. IMPRESSION: Lateral soft tissue swelling without evidence of  acute fracture or dislocation. Electronically Signed   By: Richardean Sale M.D.   On: 02/20/2019 14:38   Dg Foot Complete Left  Result Date: 02/20/2019 CLINICAL DATA:  Left foot pain and swelling following a fall yesterday. EXAM: LEFT FOOT - COMPLETE 3+ VIEW COMPARISON:  None. FINDINGS: Mild dorsal distal soft tissue swelling. No fracture or dislocation seen. Atheromatous arterial calcifications. IMPRESSION: No fracture. Electronically Signed   By: Percell Locus.D.  On: 02/20/2019 12:07   Dg Foot Complete Right  Result Date: 02/20/2019 CLINICAL DATA:  Right foot pain. EXAM: RIGHT FOOT COMPLETE - 3+ VIEW COMPARISON:  No recent. FINDINGS: Diffuse severe soft tissue swelling, particular prominent distally. Peripheral vascular calcification. No radiopaque foreign body. Lucency noted over the medial cuboids on oblique view most likely represent overlapping shadows. No definite evidence of fracture dislocation. No acute bony abnormality identified. Degenerative change first MTP joint. IMPRESSION: 1. Diffuse severe soft tissue swelling, particular prominent distally. 2.  Peripheral vascular disease. 3.  No acute abnormality.  Degenerative changes first MTP joint. Electronically Signed   By: Marcello Moores  Register   On: 02/20/2019 14:38    Procedures Procedures (including critical care time)  Medications Ordered in ED Medications - No data to display   Initial Impression / Assessment and Plan / ED Course  I have reviewed the triage vital signs and the nursing notes.  Pertinent labs & imaging results that were available during my care of the patient were reviewed by me and considered in my medical decision making (see chart for details).  68 year old male presenting today after a fall that occurred yesterday. Pt has hx of dementia; limited history provided by him. Most history provided by niece [name] who reports pt fell after getting up off the toilet yesterday. No head injury or LOC. Has been complaining  of right foot pain; foot is swollen today with TTP. She reports the foot only started swelling after the fall; no concern for DVT today given known injury with twisting mechanism. Will obtain xrays to ensure there are no fractures prior to discharge. Do not feel patient needs to be worked up any further given witnessed fall; not complaining of any symptoms prior to the fall; and no head injury or LOC. Updated niece [name] on plan; she is aware and agrees.   Initially left foot xrays ordered by mistake; have ordered right foot xrays as well. While nursing staff wrapped right foot they noticed a small pressure ulcer to patient's left foot between the great toe and 2nd toe; patient cannot say how long it has been there but denies any pain. Discussed these findings with niece; she is unsure how long it has been there as well; states maybe 2 weeks. Patient has home health aid who comes to the house everyday but she has not mentioned this either. Niece does endorse that patient has been complaining of chills lately; no known hx of diabetes. No signs of osteo on xrays of left foot; will obtain wound culture today and order basic bloodwork. Likely patient will be discharged home with dressing to foot and close PCP/podiatry follow up for wound to left foot. Pt afebrile in the ED today; not tachycardic or tachypneic. Does not meet SIRS criteria.   CBC without leukocytosis to suggest infection; creatinine and potassium elevated; unsure if this is pt's baseline. Will call PCP to discuss as no previous labs to compare to.   Clinical Course as of Feb 19 1542  Fri Feb 20, 2019  1406 Elevated creatinine; do not have baseline to compare.   Creatinine(!): 2.61 [MV]  1406 Elevated today; will obtain EKG to check for cardiac abnormalities  Potassium(!): 5.4 [MV]  1419 Discussed patient with PCP Dr. Vista Lawman; he reports on 04/29 pts creatinine was 2.23, potassium 4.3; he is currently getting established with nephrology    [MV]    Clinical Course User Index [MV] Eustaquio Maize, PA-C  Final Clinical Impressions(s) / ED Diagnoses   Final diagnoses:  Fall, initial encounter  Sprain of right foot, initial encounter  Pressure injury of toe of left foot, stage 2 [facility])    ED Discharge Orders    None       Eustaquio Maize, PA-C 02/20/19 1554    Jola Schmidt, MD 02/20/19 1620

## 2019-02-22 LAB — AEROBIC CULTURE W GRAM STAIN (SUPERFICIAL SPECIMEN)

## 2019-02-23 ENCOUNTER — Telehealth (HOSPITAL_BASED_OUTPATIENT_CLINIC_OR_DEPARTMENT_OTHER): Payer: Self-pay | Admitting: Emergency Medicine

## 2019-02-23 NOTE — Telephone Encounter (Signed)
Post ED Visit - Positive Culture Follow-up  Culture report reviewed by antimicrobial stewardship pharmacist: [facility] Team []  Elenor Quinones, Pharm.D. []  Heide Guile, Pharm.D., BCPS AQ-ID []  Parks Neptune, Pharm.D., BCPS []  Alycia Rossetti, Pharm.D., BCPS []  [place], Pharm.D., BCPS, AAHIVP []  Legrand Como, Pharm.D., BCPS, AAHIVP [x]  Salome Arnt, PharmD, BCPS []  Johnnette Gourd, PharmD, BCPS []  Hughes Better, PharmD, BCPS []  Leeroy Cha, PharmD []  Laqueta Linden, PharmD, BCPS []  Albertina Parr, PharmD  [place] Team []  Leodis Sias, PharmD []  Lindell Spar, PharmD []  Royetta Asal, PharmD []  Graylin Shiver, Rph []  Rema Fendt) Glennon Mac, PharmD []  Arlyn Dunning, PharmD []  Netta Cedars, PharmD []  Dia Sitter, PharmD []  Leone Haven, PharmD []  Gretta Arab, PharmD []  Theodis Shove, PharmD []  Peggyann Juba, PharmD []  Reuel Boom, PharmD   Positive wound culture Treated with none, asymptomatic, no further patient follow-up is required at this time.  Hazle Nordmann 02/23/2019, 8:26 AM

## 2019-03-14 ENCOUNTER — Encounter (HOSPITAL_COMMUNITY): Payer: Self-pay

## 2019-03-14 ENCOUNTER — Inpatient Hospital Stay (HOSPITAL_COMMUNITY)
Admission: EM | Admit: 2019-03-14 | Discharge: 2019-03-23 | DRG: 299 | Disposition: A | Discharge status: Home Health | Payer: Medicare Other | Attending: Internal Medicine | Admitting: Internal Medicine

## 2019-03-14 ENCOUNTER — Other Ambulatory Visit: Payer: Self-pay

## 2019-03-14 ENCOUNTER — Emergency Department (HOSPITAL_COMMUNITY): Payer: Medicare Other

## 2019-03-14 DIAGNOSIS — L97929 Non-pressure chronic ulcer of unspecified part of left lower leg with unspecified severity: Secondary | ICD-10-CM | POA: Diagnosis present

## 2019-03-14 DIAGNOSIS — L89893 Pressure ulcer of other site, stage 3: Secondary | ICD-10-CM | POA: Diagnosis present

## 2019-03-14 DIAGNOSIS — E785 Hyperlipidemia, unspecified: Secondary | ICD-10-CM | POA: Diagnosis present

## 2019-03-14 DIAGNOSIS — Z7982 Long term (current) use of aspirin: Secondary | ICD-10-CM

## 2019-03-14 DIAGNOSIS — L97519 Non-pressure chronic ulcer of other part of right foot with unspecified severity: Secondary | ICD-10-CM | POA: Diagnosis present

## 2019-03-14 DIAGNOSIS — I252 Old myocardial infarction: Secondary | ICD-10-CM

## 2019-03-14 DIAGNOSIS — I708 Atherosclerosis of other arteries: Secondary | ICD-10-CM | POA: Diagnosis present

## 2019-03-14 DIAGNOSIS — Z833 Family history of diabetes mellitus: Secondary | ICD-10-CM

## 2019-03-14 DIAGNOSIS — Z951 Presence of aortocoronary bypass graft: Secondary | ICD-10-CM

## 2019-03-14 DIAGNOSIS — N183 Chronic kidney disease, stage 3 (moderate): Secondary | ICD-10-CM | POA: Diagnosis not present

## 2019-03-14 DIAGNOSIS — Z79899 Other long term (current) drug therapy: Secondary | ICD-10-CM

## 2019-03-14 DIAGNOSIS — I2510 Atherosclerotic heart disease of native coronary artery without angina pectoris: Secondary | ICD-10-CM | POA: Diagnosis present

## 2019-03-14 DIAGNOSIS — N184 Chronic kidney disease, stage 4 (severe): Secondary | ICD-10-CM | POA: Diagnosis present

## 2019-03-14 DIAGNOSIS — R6250 Unspecified lack of expected normal physiological development in childhood: Secondary | ICD-10-CM | POA: Diagnosis present

## 2019-03-14 DIAGNOSIS — Z1159 Encounter for screening for other viral diseases: Secondary | ICD-10-CM | POA: Diagnosis not present

## 2019-03-14 DIAGNOSIS — I739 Peripheral vascular disease, unspecified: Secondary | ICD-10-CM | POA: Diagnosis not present

## 2019-03-14 DIAGNOSIS — R001 Bradycardia, unspecified: Secondary | ICD-10-CM | POA: Diagnosis present

## 2019-03-14 DIAGNOSIS — M869 Osteomyelitis, unspecified: Secondary | ICD-10-CM | POA: Diagnosis present

## 2019-03-14 DIAGNOSIS — I70203 Unspecified atherosclerosis of native arteries of extremities, bilateral legs: Secondary | ICD-10-CM | POA: Diagnosis present

## 2019-03-14 DIAGNOSIS — M868X7 Other osteomyelitis, ankle and foot: Secondary | ICD-10-CM | POA: Diagnosis not present

## 2019-03-14 DIAGNOSIS — I10 Essential (primary) hypertension: Secondary | ICD-10-CM

## 2019-03-14 DIAGNOSIS — Z8249 Family history of ischemic heart disease and other diseases of the circulatory system: Secondary | ICD-10-CM

## 2019-03-14 DIAGNOSIS — E1122 Type 2 diabetes mellitus with diabetic chronic kidney disease: Secondary | ICD-10-CM | POA: Diagnosis present

## 2019-03-14 DIAGNOSIS — Z7901 Long term (current) use of anticoagulants: Secondary | ICD-10-CM

## 2019-03-14 DIAGNOSIS — I959 Hypotension, unspecified: Secondary | ICD-10-CM | POA: Diagnosis present

## 2019-03-14 DIAGNOSIS — I70244 Atherosclerosis of native arteries of left leg with ulceration of heel and midfoot: Secondary | ICD-10-CM | POA: Diagnosis not present

## 2019-03-14 DIAGNOSIS — F1020 Alcohol dependence, uncomplicated: Secondary | ICD-10-CM | POA: Diagnosis present

## 2019-03-14 DIAGNOSIS — E11649 Type 2 diabetes mellitus with hypoglycemia without coma: Secondary | ICD-10-CM | POA: Diagnosis not present

## 2019-03-14 DIAGNOSIS — E11621 Type 2 diabetes mellitus with foot ulcer: Secondary | ICD-10-CM | POA: Diagnosis present

## 2019-03-14 DIAGNOSIS — I129 Hypertensive chronic kidney disease with stage 1 through stage 4 chronic kidney disease, or unspecified chronic kidney disease: Secondary | ICD-10-CM | POA: Diagnosis present

## 2019-03-14 DIAGNOSIS — Z87891 Personal history of nicotine dependence: Secondary | ICD-10-CM

## 2019-03-14 DIAGNOSIS — F0151 Vascular dementia with behavioral disturbance: Secondary | ICD-10-CM | POA: Diagnosis present

## 2019-03-14 DIAGNOSIS — E1152 Type 2 diabetes mellitus with diabetic peripheral angiopathy with gangrene: Principal | ICD-10-CM | POA: Diagnosis present

## 2019-03-14 DIAGNOSIS — S91301A Unspecified open wound, right foot, initial encounter: Secondary | ICD-10-CM | POA: Diagnosis present

## 2019-03-14 DIAGNOSIS — L8990 Pressure ulcer of unspecified site, unspecified stage: Secondary | ICD-10-CM | POA: Insufficient documentation

## 2019-03-14 DIAGNOSIS — I480 Paroxysmal atrial fibrillation: Secondary | ICD-10-CM | POA: Diagnosis present

## 2019-03-14 DIAGNOSIS — E1169 Type 2 diabetes mellitus with other specified complication: Secondary | ICD-10-CM | POA: Diagnosis present

## 2019-03-14 DIAGNOSIS — M21372 Foot drop, left foot: Secondary | ICD-10-CM | POA: Diagnosis present

## 2019-03-14 DIAGNOSIS — E162 Hypoglycemia, unspecified: Secondary | ICD-10-CM | POA: Diagnosis not present

## 2019-03-14 DIAGNOSIS — Z8673 Personal history of transient ischemic attack (TIA), and cerebral infarction without residual deficits: Secondary | ICD-10-CM

## 2019-03-14 DIAGNOSIS — F0390 Unspecified dementia without behavioral disturbance: Secondary | ICD-10-CM | POA: Diagnosis present

## 2019-03-14 DIAGNOSIS — I70234 Atherosclerosis of native arteries of right leg with ulceration of heel and midfoot: Secondary | ICD-10-CM | POA: Diagnosis not present

## 2019-03-14 LAB — CBC WITH DIFFERENTIAL/PLATELET
Abs Immature Granulocytes: 0.03 10*3/uL (ref 0.00–0.07)
Basophils Absolute: 0 10*3/uL (ref 0.0–0.1)
Basophils Relative: 1 %
Eosinophils Absolute: 0.1 10*3/uL (ref 0.0–0.5)
Eosinophils Relative: 1 %
HCT: 37.7 % — ABNORMAL LOW (ref 39.0–52.0)
Hemoglobin: 11.6 g/dL — ABNORMAL LOW (ref 13.0–17.0)
Immature Granulocytes: 0 %
Lymphocytes Relative: 39 %
Lymphs Abs: 3 10*3/uL (ref 0.7–4.0)
MCH: 25.8 pg — ABNORMAL LOW (ref 26.0–34.0)
MCHC: 30.8 g/dL (ref 30.0–36.0)
MCV: 84 fL (ref 80.0–100.0)
Monocytes Absolute: 0.5 10*3/uL (ref 0.1–1.0)
Monocytes Relative: 7 %
Neutro Abs: 4 10*3/uL (ref 1.7–7.7)
Neutrophils Relative %: 52 %
Platelets: 345 10*3/uL (ref 150–400)
RBC: 4.49 MIL/uL (ref 4.22–5.81)
RDW: 15.4 % (ref 11.5–15.5)
WBC: 7.6 10*3/uL (ref 4.0–10.5)
nRBC: 0 % (ref 0.0–0.2)

## 2019-03-14 LAB — COMPREHENSIVE METABOLIC PANEL
ALT: 40 U/L (ref 0–44)
AST: 43 U/L — ABNORMAL HIGH (ref 15–41)
Albumin: 2.7 g/dL — ABNORMAL LOW (ref 3.5–5.0)
Alkaline Phosphatase: 60 U/L (ref 38–126)
Anion gap: 8 (ref 5–15)
BUN: 43 mg/dL — ABNORMAL HIGH (ref 8–23)
CO2: 20 mmol/L — ABNORMAL LOW (ref 22–32)
Calcium: 8.8 mg/dL — ABNORMAL LOW (ref 8.9–10.3)
Chloride: 110 mmol/L (ref 98–111)
Creatinine, Ser: 2.94 mg/dL — ABNORMAL HIGH (ref 0.61–1.24)
GFR calc Af Amer: 24 mL/min — ABNORMAL LOW (ref >60)
GFR calc non Af Amer: 21 mL/min — ABNORMAL LOW (ref >60)
Glucose, Bld: 112 mg/dL — ABNORMAL HIGH (ref 70–99)
Potassium: 4.7 mmol/L (ref 3.5–5.1)
Sodium: 138 mmol/L (ref 135–145)
Total Bilirubin: 0.7 mg/dL (ref 0.3–1.2)
Total Protein: 7.2 g/dL (ref 6.5–8.1)

## 2019-03-14 LAB — LACTIC ACID, PLASMA
Lactic Acid, Venous: 1.2 mmol/L (ref 0.5–1.9)
Lactic Acid, Venous: 1.1 mmol/L (ref 0.5–1.9)

## 2019-03-14 LAB — POC OCCULT BLOOD, ED: Fecal Occult Bld: NEGATIVE

## 2019-03-14 LAB — SARS CORONAVIRUS 2 BY RT PCR (HOSPITAL ORDER, PERFORMED IN ~~LOC~~ HOSPITAL LAB): SARS Coronavirus 2: NEGATIVE

## 2019-03-14 LAB — SEDIMENTATION RATE: Sed Rate: 43 mm/hr — ABNORMAL HIGH (ref 0–16)

## 2019-03-14 LAB — C-REACTIVE PROTEIN: CRP: <0.8 mg/dL (ref <1.0)

## 2019-03-14 MED ORDER — PIPERACILLIN-TAZOBACTAM IN DEX 2-0.25 GM/50ML IV SOLN
2.2500 g | Freq: Once | INTRAVENOUS | Status: AC
  Administered 2019-03-14: 17:00:00 2.25 g via INTRAVENOUS
  Filled 2019-03-14: qty 50

## 2019-03-14 MED ORDER — ACETAMINOPHEN 650 MG RE SUPP: 650.0000 mg | Freq: Four times a day (QID) | RECTAL | Status: DC | PRN

## 2019-03-14 MED ORDER — ACETAMINOPHEN 325 MG PO TABS
650.0000 mg | ORAL_TABLET | Freq: Four times a day (QID) | ORAL | Status: DC | PRN
  Administered 2019-03-17 – 2019-03-23 (×7): 650 mg via ORAL
  Filled 2019-03-14 (×10): qty 2

## 2019-03-14 MED ORDER — VANCOMYCIN HCL 10 G IV SOLR
2000.0000 mg | Freq: Once | INTRAVENOUS | Status: AC
  Administered 2019-03-14: 2000 mg via INTRAVENOUS
  Filled 2019-03-14: qty 2000

## 2019-03-14 MED ORDER — ONDANSETRON HCL 4 MG PO TABS: 4.0000 mg | ORAL_TABLET | Freq: Four times a day (QID) | ORAL | Status: DC | PRN

## 2019-03-14 MED ORDER — ATORVASTATIN CALCIUM 40 MG PO TABS
40.0000 mg | ORAL_TABLET | Freq: Every day | ORAL | Status: DC
  Administered 2019-03-15 – 2019-03-23 (×9): 40 mg via ORAL
  Filled 2019-03-14 (×9): qty 1

## 2019-03-14 MED ORDER — APIXABAN 5 MG PO TABS
5.0000 mg | ORAL_TABLET | Freq: Two times a day (BID) | ORAL | Status: DC
  Administered 2019-03-14 – 2019-03-18 (×8): 5 mg via ORAL
  Filled 2019-03-14 (×8): qty 1

## 2019-03-14 MED ORDER — VANCOMYCIN HCL 10 G IV SOLR
1250.0000 mg | INTRAVENOUS | Status: DC
  Administered 2019-03-15: 1250 mg via INTRAVENOUS
  Filled 2019-03-14 (×2): qty 1250

## 2019-03-14 MED ORDER — AMIODARONE HCL 200 MG PO TABS
200.0000 mg | ORAL_TABLET | Freq: Two times a day (BID) | ORAL | Status: DC
  Administered 2019-03-14 – 2019-03-23 (×18): 200 mg via ORAL
  Filled 2019-03-14 (×18): qty 1

## 2019-03-14 MED ORDER — ASPIRIN EC 81 MG PO TBEC
81.0000 mg | DELAYED_RELEASE_TABLET | Freq: Every day | ORAL | Status: DC
  Administered 2019-03-15 – 2019-03-23 (×9): 81 mg via ORAL
  Filled 2019-03-14 (×10): qty 1

## 2019-03-14 MED ORDER — SODIUM CHLORIDE 0.9 % IV SOLN
2.0000 g | INTRAVENOUS | Status: DC
  Administered 2019-03-15 – 2019-03-16 (×2): 2 g via INTRAVENOUS
  Filled 2019-03-14 (×2): qty 20

## 2019-03-14 MED ORDER — ONDANSETRON HCL 4 MG/2ML IJ SOLN: 4.0000 mg | Freq: Four times a day (QID) | INTRAMUSCULAR | Status: DC | PRN

## 2019-03-14 NOTE — ED Triage Notes (Signed)
Patient dropped off by family member with complaints of new foot ulcers on right foot. States that he started noticing them about 1 week ago.

## 2019-03-14 NOTE — H&P (Signed)
History and Physical    [name] MBE:[id number] DOB: [birth year] DOA: 03/14/2019  PCP: [name], MD  Patient coming from: Home  I have personally briefly reviewed patient's old medical records in [place]  Chief Complaint: Right ft wound  HPI: [name] is a 68 y.o. male with medical history significant for moderate dementia with behavioral disturbance, CAD status post MI, paroxysmal atrial fibrillation on Eliquis, CKD stage IV, hypertension, hyperlipidemia, and history of substance/alcohol use who is brought to the ED by his niece for evaluation of her right foot wound.  History is limited from patient due to underlying dementia therefore entirety of history is obtained from EDP, chart review, and niece Devlyn Retter by phone.  Per niece, patient has had a right foot wound ongoing about 1.5-2 weeks.  She says he normally walks with the assistance of a walker however was having worsening pain which is interfering with his ability to safely ambulate.  She tried conservative topical therapies without improvement therefore brought him to the emergency department for further evaluation.  ED Course:  Initial vitals showed BP 95/68, pulse 66, RR 14, temp 98.6 Fahrenheit, SPO2 99% on room air.  Labs are notable for WBC 7.6, hemoglobin 11.6, platelets 345,000, potassium 4.7, bicarb 20, BUN 43, creatinine 2.94, GFR 24, lactic acid 1.2.  SARS-CoV-2 test was obtained and in process.  Right foot x-ray showed skin defect overlying the base of the fifth metatarsal with underlying bony irregularity suspicious for osteomyelitis.  Patient was given IV Zosyn and the hospitalist service was consulted to admit for further evaluation and management.   Review of Systems:  Unable to obtain full review of systems due to patient's dementia.   Past Medical History:  Diagnosis Date  . Alcoholism ([place])   . Coronary artery disease   . Dementia ([place])   . Hyperlipidemia   .  Hypertension   . Paroxysmal atrial fibrillation (HCC)   . Stroke [facility])     Past Surgical History:  Procedure Laterality Date  . heart bypass      Social History:  reports that he has quit smoking. He has never used smokeless tobacco. He reports previous alcohol use. He reports that he does not use drugs.  No Known Allergies  Family History  Problem Relation Age of Onset  . Lung cancer Sister   . Heart disease Brother   . Diabetes Sister   . Clotting disorder Sister   . Heart disease Sister   . Colon cancer Neg Hx   . Esophageal cancer Neg Hx      Prior to Admission medications   Medication Sig Start Date End Date Taking? Authorizing Provider  amiodarone (PACERONE) 200 MG tablet Take 1 tablet (200 mg total) by mouth 2 (two) times daily. 02/19/19   Patwardhan, Reynold Bowen, MD  aspirin 81 MG tablet Take 81 mg by mouth daily.    [provider]  atorvastatin (LIPITOR) 40 MG tablet Take 40 mg by mouth daily.    [provider]  ELIQUIS 5 MG TABS tablet TAKE 1 (ONE) TABLET TWICE DAILY 05/12/18   [provider]  folic acid (FOLVITE) 1 MG tablet Take 1 mg by mouth daily.     [provider]  lisinopril-hydrochlorothiazide (PRINZIDE,ZESTORETIC) 20-12.5 MG tablet Take 1 tablet by mouth daily.    [provider]  metoprolol tartrate (LOPRESSOR) 25 MG tablet Take 25 mg by mouth 2 (two) times daily.    [provider]    Physical  Exam: Vitals:   03/14/19 1347 03/14/19 1700  BP: 95/68 100/82  Pulse: 66 (!) 56  Resp: 14 14  Temp: 98.6 F (37 C)   TempSrc: Oral   SpO2: 99% 99%    Constitutional: Resting supine in bed, NAD, calm, comfortable Eyes: PERRL, lids and conjunctivae normal ENMT: Mucous membranes are moist. Posterior pharynx clear of any exudate or lesions.Normal dentition.  Neck: normal, supple, no masses. Respiratory: clear to auscultation bilaterally, no wheezing, no crackles. Normal respiratory effort. No accessory  muscle use.  Cardiovascular: Regular rate, bradycardic, no murmurs / rubs / gallops.  Slight edema on right dorsal foot.  Pedal pulses difficult to palpate. Abdomen: no tenderness, no masses palpated. No hepatosplenomegaly. Bowel sounds positive.  Musculoskeletal: no clubbing / cyanosis. No joint deformity upper and lower extremities. Good ROM, no contractures. Normal muscle tone.  Skin: Circumferential stage III ulcer right lateral foot at the base of the fifth metatarsal without active drainage, tender to palpation Neurologic: CN 2-12 grossly intact. Sensation intact, Strength 5/5 in all 4.  Psychiatric: Alert and oriented to person and place but not month or year.      Labs on Admission: I have personally reviewed following labs and imaging studies  CBC: Recent Labs  Lab 03/14/19 1348  WBC 7.6  NEUTROABS 4.0  HGB 11.6*  HCT 37.7*  MCV 84.0  PLT 938   Basic Metabolic Panel: Recent Labs  Lab 03/14/19 1348  NA 138  K 4.7  CL 110  CO2 20*  GLUCOSE 112*  BUN 43*  CREATININE 2.94*  CALCIUM 8.8*   GFR: CrCl cannot be calculated (Unknown ideal weight.). Liver Function Tests: Recent Labs  Lab 03/14/19 1348  AST 43*  ALT 40  ALKPHOS 60  BILITOT 0.7  PROT 7.2  ALBUMIN 2.7*   No results for input(s): LIPASE, AMYLASE in the last 168 hours. No results for input(s): AMMONIA in the last 168 hours. Coagulation Profile: No results for input(s): INR, PROTIME in the last 168 hours. Cardiac Enzymes: No results for input(s): CKTOTAL, CKMB, CKMBINDEX, TROPONINI in the last 168 hours. BNP (last 3 results) No results for input(s): PROBNP in the last 8760 hours. HbA1C: No results for input(s): HGBA1C in the last 72 hours. CBG: No results for input(s): GLUCAP in the last 168 hours. Lipid Profile: No results for input(s): CHOL, HDL, LDLCALC, TRIG, CHOLHDL, LDLDIRECT in the last 72 hours. Thyroid Function Tests: No results for input(s): TSH, T4TOTAL, FREET4, T3FREE, THYROIDAB  in the last 72 hours. Anemia Panel: No results for input(s): VITAMINB12, FOLATE, FERRITIN, TIBC, IRON, RETICCTPCT in the last 72 hours. Urine analysis: No results found for: COLORURINE, APPEARANCEUR, LABSPEC, PHURINE, GLUCOSEU, HGBUR, BILIRUBINUR, KETONESUR, PROTEINUR, UROBILINOGEN, NITRITE, LEUKOCYTESUR  Radiological Exams on Admission: Dg Foot Complete Right  Result Date: 03/14/2019 CLINICAL DATA:  Swelling in right foot. EXAM: RIGHT FOOT COMPLETE - 3+ VIEW COMPARISON:  February 20, 2019 FINDINGS: There appears to be a skin defect overlying the base of the fifth metatarsal seen on oblique images. There is mild new irregularity of the underlying fifth metatarsal base. Vascular calcifications. No fractures. Soft tissue swelling. IMPRESSION: There is a skin defect overlying the base of the fifth metatarsal with mild underlying bony irregularity. Findings are suspicious for subtle osteomyelitis. MRI could better assess. Soft tissue swelling. No other abnormalities. Electronically Signed   By: Dorise Bullion III M.D   On: 03/14/2019 15:49    EKG: Not performed.  Assessment/Plan Principal Problem:   Wound of right foot Active  Problems:   Dementia ([place])   Coronary artery disease   Hyperlipidemia   Hypertension   Paroxysmal atrial fibrillation (HCC)   CKD (chronic kidney disease), stage IV (HCC)  Jacquise Rarick is a 68 y.o. male with medical history significant for moderate dementia with behavioral disturbance, CAD status post MI, paroxysmal atrial fibrillation on Eliquis, CKD stage IV, hypertension, hyperlipidemia, and history of substance/alcohol use who is admitted with a right foot wound suspicious for osteomyelitis.   Right foot wound: Potential early osteomyelitis by x-ray.  Possible underlying peripheral vascular disease as patient has diminished pedal pulses and cool extremities. -Start IV vancomycin and ceftriaxone -Obtain MRI of the right foot -Obtain ABIs -Consult wound care  CKD  Stage IV: Scheduled to see nephrology August.  May need to adjust home antihypertensives.  Paroxysmal Atrial Fibrillation: Currently in sinus rhythm, bradycardic. -Continue Eliquis 5 mg twice daily for now -Continue amiodarone -Holding Lopressor due to borderline bradycardia  CAD status post MI: -Continue aspirin and atorvastatin -Holding Lopressor as above  Hypertension: Hypotensive on admission.  Holding home lisinopril-HCTZ and Lopressor.  Hyperlipidemia: Continue atorvastatin.  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  Follows with neurology.   DVT prophylaxis: Eliquis Code Status: Full code, confirmed with niece. Family Communication: Discussed with niece [place] by phone [phone] Disposition Plan: Pending clinical progress Consults called: None Admission status: Admit - It is my clinical opinion that admission to INPATIENT is reasonable and necessary because of the expectation that this patient will require hospital care that crosses at least 2 midnights to treat this condition based on the medical complexity of the problems presented.  Given the aforementioned information, the predictability of an adverse outcome is felt to be significant.      Zada Finders MD [facility]  If 7PM-7AM, please contact night-coverage [email or web address]  03/14/2019, 6:36 PM

## 2019-03-14 NOTE — ED Provider Notes (Signed)
[facility] EMERGENCY DEPARTMENT Provider Note   CSN: [id number] Arrival date & time: 03/14/19  1325    History   Chief Complaint Chief Complaint  Patient presents with  . Foot Pain    PATIENT HAS FOOT ULCERS   LEVEL 5 CAVEAT - DEMENTIA   HPI [name] is a 68 y.o. male with PMHx Dementia, Stroke, Paroxysmal A fib, HTN, and HLD who presents to the ED for new foot ulcers to right foot. Pt was seen in the ED on 06/19 for a fall and left sided foot pain with ulcers; he had a wound culture done at that time without any specific organism growth. Pt was discharged home and told to follow up with podiatry. Per pt family; pt never followed up as there was some confusion upon discharge. Family brought pt back due to pt continuing to complain of foot pain and a new ulcer to the lateral aspect of his right foot that has been present for about 1.5 weeks. No fevers at home. No other complaints at this time.      The history is provided by the patient and a relative. The history is limited by a developmental delay. No language interpreter was used.    Past Medical History:  Diagnosis Date  . Alcoholism ([place])   . Coronary artery disease   . Dementia ([place])   . Hyperlipidemia   . Hypertension   . Paroxysmal atrial fibrillation (HCC)   . Stroke [facility])     Patient Active Problem List   Diagnosis Date Noted  . Wound of right foot 03/14/2019  . Coronary artery disease   . Hyperlipidemia   . Hypertension   . Paroxysmal atrial fibrillation (HCC)   . CKD (chronic kidney disease), stage IV ([place])   . Dementia ([place]) 02/10/2019    Past Surgical History:  Procedure Laterality Date  . heart bypass          Home Medications    Prior to Admission medications   Medication Sig Start Date End Date Taking? Authorizing Provider  amiodarone (PACERONE) 200 MG tablet Take 1 tablet (200 mg total) by mouth 2 (two) times daily. 02/19/19  Yes Patwardhan, Manish J, MD  apixaban  (ELIQUIS) 5 MG TABS tablet Take 5 mg by mouth 2 (two) times daily with a meal.   Yes [provider]  aspirin EC 81 MG tablet Take 81 mg by mouth daily.   Yes [provider]  atorvastatin (LIPITOR) 40 MG tablet Take 40 mg by mouth at bedtime.    Yes [provider]  folic acid (FOLVITE) 1 MG tablet Take 1 mg by mouth daily.    Yes [provider]  lisinopril-hydrochlorothiazide (PRINZIDE,ZESTORETIC) 20-12.5 MG tablet Take 1 tablet by mouth daily.   Yes [provider]  metoprolol succinate (TOPROL-XL) 25 MG 24 hr tablet Take 25 mg by mouth 2 (two) times daily with a meal.  02/19/19  Yes [provider]    Family History Family History  Problem Relation Age of Onset  . Lung cancer Sister   . Heart disease Brother   . Diabetes Sister   . Clotting disorder Sister   . Heart disease Sister   . Colon cancer Neg Hx   . Esophageal cancer Neg Hx     Social History Social History   Tobacco Use  . Smoking status: Former Research scientist (life sciences)  . Smokeless tobacco: Never Used  Substance Use Topics  . Alcohol use: Not Currently  .  Drug use: Never     Allergies   Patient has no known allergies.   Review of Systems Review of Systems  Unable to perform ROS: Dementia  Musculoskeletal: Positive for arthralgias.  Skin: Positive for wound.     Physical Exam Updated Vital Signs BP 95/68 (BP Location: Right Arm)   Pulse 66   Temp 98.6 F (37 C) (Oral)   Resp 14   SpO2 99%   Physical Exam Vitals signs and nursing note reviewed.  Constitutional:      Appearance: He is not ill-appearing.  HENT:     Head: Normocephalic and atraumatic.  Eyes:     Conjunctiva/sclera: Conjunctivae normal.  Neck:     Musculoskeletal: Neck supple.  Cardiovascular:     Rate and Rhythm: Normal rate and regular rhythm.  Pulmonary:     Effort: Pulmonary effort is normal.     Breath sounds: Normal breath sounds.  Abdominal:     Palpations: Abdomen is soft.      Tenderness: There is no abdominal tenderness.  Musculoskeletal:     Comments: 2 large stage III pressure ulcers to lateral aspect of right foot; one located midfoot and the other located to lateral aspect of heel; no erythema noted. Foot is mildly swollen but unchanged from previous. Distal pulses intact. Full ROM of right ankle. Strength and sensation intact.   Skin:    General: Skin is warm and dry.  Neurological:     Mental Status: He is alert. Mental status is at baseline.      ED Treatments / Results  Labs (all labs ordered are listed, but only abnormal results are displayed) Labs Reviewed  CBC WITH DIFFERENTIAL/PLATELET - Abnormal; Notable for the following components:      Result Value   Hemoglobin 11.6 (*)    HCT 37.7 (*)    MCH 25.8 (*)    All other components within normal limits  COMPREHENSIVE METABOLIC PANEL - Abnormal; Notable for the following components:   CO2 20 (*)    Glucose, Bld 112 (*)    BUN 43 (*)    Creatinine, Ser 2.94 (*)    Calcium 8.8 (*)    Albumin 2.7 (*)    AST 43 (*)    GFR calc non Af Amer 21 (*)    GFR calc Af Amer 24 (*)    All other components within normal limits  SARS CORONAVIRUS 2 (HOSPITAL ORDER, [facility])  AEROBIC CULTURE (SUPERFICIAL SPECIMEN)  LACTIC ACID, PLASMA  LACTIC ACID, PLASMA  SEDIMENTATION RATE  C-REACTIVE PROTEIN  HIV ANTIBODY (ROUTINE TESTING W REFLEX)  BASIC METABOLIC PANEL  CBC  POC OCCULT BLOOD, ED    EKG None  Radiology Dg Foot Complete Right  Result Date: 03/14/2019 CLINICAL DATA:  Swelling in right foot. EXAM: RIGHT FOOT COMPLETE - 3+ VIEW COMPARISON:  February 20, 2019 FINDINGS: There appears to be a skin defect overlying the base of the fifth metatarsal seen on oblique images. There is mild new irregularity of the underlying fifth metatarsal base. Vascular calcifications. No fractures. Soft tissue swelling. IMPRESSION: There is a skin defect overlying the base of the fifth  metatarsal with mild underlying bony irregularity. Findings are suspicious for subtle osteomyelitis. MRI could better assess. Soft tissue swelling. No other abnormalities. Electronically Signed   By: Dorise Bullion III M.D   On: 03/14/2019 15:49    Procedures Procedures (including critical care time)  Medications Ordered in ED Medications  acetaminophen (TYLENOL) tablet  650 mg (has no administration in time range)    Or  acetaminophen (TYLENOL) suppository 650 mg (has no administration in time range)  ondansetron (ZOFRAN) tablet 4 mg (has no administration in time range)    Or  ondansetron (ZOFRAN) injection 4 mg (has no administration in time range)  cefTRIAXone (ROCEPHIN) 2 g in sodium chloride 0.9 % 100 mL IVPB (has no administration in time range)  vancomycin (VANCOCIN) 2,000 mg in sodium chloride 0.9 % 500 mL IVPB (2,000 mg Intravenous New Bag/Given 03/14/19 1959)  vancomycin (VANCOCIN) 1,250 mg in sodium chloride 0.9 % 250 mL IVPB (has no administration in time range)  amiodarone (PACERONE) tablet 200 mg (has no administration in time range)  aspirin tablet 81 mg (has no administration in time range)  atorvastatin (LIPITOR) tablet 40 mg (has no administration in time range)  apixaban (ELIQUIS) tablet 5 mg (has no administration in time range)  piperacillin-tazobactam (ZOSYN) IVPB 2.25 g (0 g Intravenous Stopped 03/14/19 1800)     Initial Impression / Assessment and Plan / ED Course  I have reviewed the triage vital signs and the nursing notes.  Pertinent labs & imaging results that were available during my care of the patient were reviewed by me and considered in my medical decision making (see chart for details).  68 year old male presenting to the ED today for pressure ulcers to right foot. Pt previously seen by same provider on 06/19 for left foot pain s/p fall; found to have an ulcer at that time between great toe and 2nd toe of left foot; wound cultures obtained which were  negative and pt advised to follow up with podiatry. Family dropped patient back off today due to new ulcers to right foot x 1.5-2 weeks. Reports patient consistently complains of pain to his feet as well. No fevers at home. Pt afebrile in the ED today without tachycardia or tachypnea. Initially mildly hypotensive at 95/68; while in the room systolic of 716/96 which is more near pt's baseline.   No WBC count today. Electrolyte remain stable from previous; pt has CKD; he is in the process of getting established with a nephrologist and his appointment is sometime in August per pt's niece whom I spoke with directly on the phone to obtain more history. Potassium within normal limits today. Xray with concerns for osteomyelitis; patient started on antibiotics at this time - renally dosed today due to CrCl of 14. Lactic acid normal; no concerns for sepsis at this time. Pt will need to be admitted for IV abx and further workup; radiologist recommend MRI. Will consult hospitalist for admission.   Clinical Course as of Mar 13 2014  Sat Mar 14, 2019  1744 Decreased from 3 weeks ago; unsure what cause is. Will obtain FOBT  Hemoglobin(!): 11.6 [MV]  1810 Discussed case with hospitalist Dr. Posey Pronto who agrees to accept patient for admission   [MV]    Clinical Course User Index [MV] Eustaquio Maize, PA-C         Final Clinical Impressions(s) / ED Diagnoses   Final diagnoses:  Pressure injury of right foot, stage 3 ([place])  Osteomyelitis of right foot, unspecified type [facility])    ED Discharge Orders    None       Eustaquio Maize, PA-C 03/14/19 2015    Pattricia Boss, MD 03/17/19 [phone]

## 2019-03-14 NOTE — ED Notes (Signed)
Patient transported to X-ray 

## 2019-03-14 NOTE — ED Notes (Signed)
Attempted to call report x2

## 2019-03-14 NOTE — Progress Notes (Addendum)
Received report from Lluveras, RN in ED

## 2019-03-14 NOTE — ED Notes (Signed)
Attempted to call report x 1  

## 2019-03-14 NOTE — ED Notes (Signed)
Patient back from  X-ray 

## 2019-03-14 NOTE — Progress Notes (Signed)
Pharmacy Antibiotic Note  [name] is a 68 y.o. male admitted on 03/14/2019 with foot ulcers on R foot. Starting broad spectrum antibiotics for possible osteomyelitis. SCr 2.9, normalized CrCl ~25 ml/min. Patient received one dose of Zosyn in ED.   Plan: -Ceftriaxone 2 g IV q24h -Vancomycin 2 g IV x1 then 1250 mg IV q24h -Monitor renal fx, cultures, VT at steady state   Temp (24hrs), Avg:98.6 F (37 C), Min:98.6 F (37 C), Max:98.6 F (37 C)  Recent Labs  Lab 03/14/19 1348 03/14/19 1731  WBC 7.6  --   CREATININE 2.94*  --   LATICACIDVEN 1.2 1.1     Antimicrobials this admission: 7/11 vancomycin > 7/11 ceftriaxone > 7/11 zosyn x1  Dose adjustments this admission: N/A  Microbiology results: 7/11 Covid-19: neg 7/11 wound cx:   [name] 03/14/2019 6:42 PM

## 2019-03-14 NOTE — ED Notes (Signed)
ED TO INPATIENT HANDOFF REPORT  ED Nurse Name and Phone #: [phone]  S Name/Age/Gender [name] 68 y.o. male Room/Bed: 043C/043C  Code Status   Code Status: Not on file  Home/SNF/Other Home Patient oriented to: self, place, time and situation Is this baseline? Yes   Triage Complete: Triage complete  Chief Complaint foot ulcers  Triage Note Patient dropped off by family member with complaints of new foot ulcers on right foot. States that he started noticing them about 1 week ago.    Allergies No Known Allergies  Level of Care/Admitting Diagnosis ED Disposition    ED Disposition Condition [facility] Area: [facility] [[id number]]  Level of Care: Med-Surg [16]  Covid Evaluation: Asymptomatic Screening Protocol (No Symptoms)  Diagnosis: Wound of right foot [[id number]]  Admitting Physician: Lenore Cordia [[id number]]  Attending Physician: Lenore Cordia [[id number]]  Estimated length of stay: past midnight tomorrow  Certification:: I certify this patient will need inpatient services for at least 2 midnights  PT Class (Do Not Modify): Inpatient [101]  PT Acc Code (Do Not Modify): Private [1]       B Medical/Surgery History Past Medical History:  Diagnosis Date  . Alcoholism ([place])   . Coronary artery disease   . Dementia ([place])   . Hyperlipidemia   . Hypertension   . Paroxysmal atrial fibrillation (HCC)   . Stroke [facility])    Past Surgical History:  Procedure Laterality Date  . heart bypass       A IV Location/Drains/Wounds Patient Lines/Drains/Airways Status   Active Line/Drains/Airways    Name:   Placement date:   Placement time:   Site:   Days:   Peripheral IV 03/14/19 Right Antecubital   03/14/19    1728    Antecubital   less than 1          Intake/Output Last 24 hours  Intake/Output Summary (Last 24 hours) at 03/14/2019 1835 Last data filed at 03/14/2019 1800 Gross per 24 hour  Intake 50 ml  Output -  Net 50 ml     Labs/Imaging Results for orders placed or performed during the hospital encounter of 03/14/19 (from the past 48 hour(s))  CBC with Differential     Status: Abnormal   Collection Time: 03/14/19  1:48 PM  Result Value Ref Range   WBC 7.6 4.0 - 10.5 K/uL   RBC 4.49 4.22 - 5.81 MIL/uL   Hemoglobin 11.6 (L) 13.0 - 17.0 g/dL   HCT 37.7 (L) 39.0 - 52.0 %   MCV 84.0 80.0 - 100.0 fL   MCH 25.8 (L) 26.0 - 34.0 pg   MCHC 30.8 30.0 - 36.0 g/dL   RDW 15.4 11.5 - 15.5 %   Platelets 345 150 - 400 K/uL   nRBC 0.0 0.0 - 0.2 %   Neutrophils Relative % 52 %   Neutro Abs 4.0 1.7 - 7.7 K/uL   Lymphocytes Relative 39 %   Lymphs Abs 3.0 0.7 - 4.0 K/uL   Monocytes Relative 7 %   Monocytes Absolute 0.5 0.1 - 1.0 K/uL   Eosinophils Relative 1 %   Eosinophils Absolute 0.1 0.0 - 0.5 K/uL   Basophils Relative 1 %   Basophils Absolute 0.0 0.0 - 0.1 K/uL   Immature Granulocytes 0 %   Abs Immature Granulocytes 0.03 0.00 - 0.07 K/uL    Comment: Performed at [facility], [address], [place], [place] [address]  Comprehensive metabolic panel  Status: Abnormal   Collection Time: 03/14/19  1:48 PM  Result Value Ref Range   Sodium 138 135 - 145 mmol/L   Potassium 4.7 3.5 - 5.1 mmol/L   Chloride 110 98 - 111 mmol/L   CO2 20 (L) 22 - 32 mmol/L   Glucose, Bld 112 (H) 70 - 99 mg/dL   BUN 43 (H) 8 - 23 mg/dL   Creatinine, Ser 2.94 (H) 0.61 - 1.24 mg/dL   Calcium 8.8 (L) 8.9 - 10.3 mg/dL   Total Protein 7.2 6.5 - 8.1 g/dL   Albumin 2.7 (L) 3.5 - 5.0 g/dL   AST 43 (H) 15 - 41 U/L   ALT 40 0 - 44 U/L   Alkaline Phosphatase 60 38 - 126 U/L   Total Bilirubin 0.7 0.3 - 1.2 mg/dL   GFR calc non Af Amer 21 (L) >60 mL/min   GFR calc Af Amer 24 (L) >60 mL/min   Anion gap 8 5 - 15    Comment: Performed at [facility], [place] [address], [place], [place] [address]  Lactic acid, plasma     Status: None   Collection Time: 03/14/19  1:48 PM  Result Value Ref Range   Lactic Acid, Venous 1.2 0.5 - 1.9  mmol/L    Comment: Performed at [facility] [address] [place], [place] [address]  Lactic acid, plasma     Status: None   Collection Time: 03/14/19  5:31 PM  Result Value Ref Range   Lactic Acid, Venous 1.1 0.5 - 1.9 mmol/L    Comment: Performed at [facility] [address], [place], [place] [address]  POC occult blood, ED Provider will collect     Status: None   Collection Time: 03/14/19  5:55 PM  Result Value Ref Range   Fecal Occult Bld NEGATIVE NEGATIVE   Dg Foot Complete Right  Result Date: 03/14/2019 CLINICAL DATA:  Swelling in right foot. EXAM: RIGHT FOOT COMPLETE - 3+ VIEW COMPARISON:  February 20, 2019 FINDINGS: There appears to be a skin defect overlying the base of the fifth metatarsal seen on oblique images. There is mild new irregularity of the underlying fifth metatarsal base. Vascular calcifications. No fractures. Soft tissue swelling. IMPRESSION: There is a skin defect overlying the base of the fifth metatarsal with mild underlying bony irregularity. Findings are suspicious for subtle osteomyelitis. MRI could better assess. Soft tissue swelling. No other abnormalities. Electronically Signed   By: Dorise Bullion III M.D   On: 03/14/2019 15:49    Pending Labs Unresulted Labs (From admission, onward)    Start     Ordered   03/14/19 1825  C-reactive protein  Once,   R     03/14/19 1825   03/14/19 1818  Sedimentation rate  Add-on,   AD     03/14/19 1817   03/14/19 1618  Wound or Superficial Culture  Once,   STAT     03/14/19 1617   03/14/19 1616  SARS Coronavirus 2 (CEPHEID - Performed in [place] hospital lab), Hosp Order  (Asymptomatic Patients Labs)  Once,   STAT    Question:  Rule Out  Answer:  Yes   03/14/19 1615          Vitals/Pain Today's Vitals   03/14/19 1347 03/14/19 1348 03/14/19 1700  BP: 95/68  100/82  Pulse: 66  (!) 56  Resp: 14  14  Temp: 98.6 F (37 C)    TempSrc: Oral  SpO2: 99%  99%  PainSc:  0-No pain     Isolation  Precautions No active isolations  Medications Medications  piperacillin-tazobactam (ZOSYN) IVPB 2.25 g (0 g Intravenous Stopped 03/14/19 1800)    Mobility walks with device Moderate fall risk      R Recommendations: See Admitting Provider Note  Report given to:   Additional Notes:

## 2019-03-15 ENCOUNTER — Inpatient Hospital Stay (HOSPITAL_COMMUNITY): Payer: Medicare Other

## 2019-03-15 DIAGNOSIS — M869 Osteomyelitis, unspecified: Secondary | ICD-10-CM

## 2019-03-15 LAB — CBC
HCT: 36.3 % — ABNORMAL LOW (ref 39.0–52.0)
Hemoglobin: 11.7 g/dL — ABNORMAL LOW (ref 13.0–17.0)
MCH: 26.4 pg (ref 26.0–34.0)
MCHC: 32.2 g/dL (ref 30.0–36.0)
MCV: 81.8 fL (ref 80.0–100.0)
Platelets: 323 10*3/uL (ref 150–400)
RBC: 4.44 MIL/uL (ref 4.22–5.81)
RDW: 15.2 % (ref 11.5–15.5)
WBC: 6 10*3/uL (ref 4.0–10.5)
nRBC: 0 % (ref 0.0–0.2)

## 2019-03-15 LAB — BASIC METABOLIC PANEL
Anion gap: 6 (ref 5–15)
BUN: 40 mg/dL — ABNORMAL HIGH (ref 8–23)
CO2: 21 mmol/L — ABNORMAL LOW (ref 22–32)
Calcium: 8.9 mg/dL (ref 8.9–10.3)
Chloride: 112 mmol/L — ABNORMAL HIGH (ref 98–111)
Creatinine, Ser: 2.61 mg/dL — ABNORMAL HIGH (ref 0.61–1.24)
GFR calc Af Amer: 28 mL/min — ABNORMAL LOW (ref >60)
GFR calc non Af Amer: 24 mL/min — ABNORMAL LOW (ref >60)
Glucose, Bld: 92 mg/dL (ref 70–99)
Potassium: 4.6 mmol/L (ref 3.5–5.1)
Sodium: 139 mmol/L (ref 135–145)

## 2019-03-15 LAB — LIPID PANEL
Cholesterol: 130 mg/dL (ref 0–200)
HDL: 33 mg/dL — ABNORMAL LOW (ref >40)
LDL Cholesterol: 79 mg/dL (ref 0–99)
Total CHOL/HDL Ratio: 3.9 RATIO
Triglycerides: 88 mg/dL (ref <150)
VLDL: 18 mg/dL (ref 0–40)

## 2019-03-15 LAB — HEMOGLOBIN A1C
Hgb A1c MFr Bld: 6.4 % — ABNORMAL HIGH (ref 4.8–5.6)
Mean Plasma Glucose: 136.98 mg/dL

## 2019-03-15 LAB — HIV ANTIBODY (ROUTINE TESTING W REFLEX): HIV Screen 4th Generation wRfx: NONREACTIVE

## 2019-03-15 MED ORDER — PHENOL 1.4 % MT LIQD: 1.0000 | OROMUCOSAL | Status: DC | PRN

## 2019-03-15 MED ORDER — POLYVINYL ALCOHOL 1.4 % OP SOLN: 1.0000 [drp] | OPHTHALMIC | Status: DC | PRN

## 2019-03-15 MED ORDER — MUSCLE RUB 10-15 % EX CREA: 1.0000 "application " | TOPICAL_CREAM | CUTANEOUS | Status: DC | PRN

## 2019-03-15 MED ORDER — SENNOSIDES-DOCUSATE SODIUM 8.6-50 MG PO TABS: 2.0000 | ORAL_TABLET | Freq: Every evening | ORAL | Status: DC | PRN

## 2019-03-15 MED ORDER — HYDRALAZINE HCL 20 MG/ML IJ SOLN: 10.0000 mg | INTRAMUSCULAR | Status: DC | PRN

## 2019-03-15 MED ORDER — LIP MEDEX EX OINT: 1.0000 "application " | TOPICAL_OINTMENT | CUTANEOUS | Status: DC | PRN

## 2019-03-15 MED ORDER — GUAIFENESIN-DM 100-10 MG/5ML PO SYRP: 5.0000 mL | ORAL_SOLUTION | ORAL | Status: DC | PRN

## 2019-03-15 MED ORDER — SALINE SPRAY 0.65 % NA SOLN: 1.0000 | NASAL | Status: DC | PRN

## 2019-03-15 MED ORDER — ALUM & MAG HYDROXIDE-SIMETH 200-200-20 MG/5ML PO SUSP
30.0000 mL | ORAL | Status: DC | PRN
  Filled 2019-03-15: qty 30

## 2019-03-15 MED ORDER — LORATADINE 10 MG PO TABS: 10.0000 mg | ORAL_TABLET | Freq: Every day | ORAL | Status: DC | PRN

## 2019-03-15 MED ORDER — IPRATROPIUM-ALBUTEROL 0.5-2.5 (3) MG/3ML IN SOLN: 3.0000 mL | RESPIRATORY_TRACT | Status: DC | PRN

## 2019-03-15 MED ORDER — HYDROCORTISONE (PERIANAL) 2.5 % EX CREA: 1.0000 "application " | TOPICAL_CREAM | Freq: Four times a day (QID) | CUTANEOUS | Status: DC | PRN

## 2019-03-15 MED ORDER — HYDROCORTISONE 1 % EX CREA: 1.0000 "application " | TOPICAL_CREAM | Freq: Three times a day (TID) | CUTANEOUS | Status: DC | PRN

## 2019-03-15 MED ORDER — POLYETHYLENE GLYCOL 3350 17 G PO PACK: 17.0000 g | PACK | Freq: Every day | ORAL | Status: DC | PRN

## 2019-03-15 NOTE — Progress Notes (Addendum)
RN called pt's niece, [name], left VM with call back number.  35 - RN spoke with pt's niece and she stated that she helps pt transfer from chair to bsc. Pt uses walker to assist but does not really move around much unless she encourages him. She does stay with him 24/7. She states pt has been dragging his left foot since she has been taking care of him. Pt unable to pick up left foot.

## 2019-03-15 NOTE — Progress Notes (Signed)
PROGRESS NOTE    [name]  TKZ:[id number] DOB: [birth year] DOA: 03/14/2019 PCP: [name], MD   Brief Narrative:  68 year old with history of moderate dementia with occasional behavioral disturbances, CAD status post MI, paroxysmal A. fib on Eliquis, CKD stage IV, essential hypertension, hyperlipidemia, history of substance use and alcohol use brought to the hospital for evaluation of right lower extremity foot wound.  Currently this has been ongoing for the past almost 2 weeks, diagnosed with bony irregularities concerning for osteomyelitis.   Assessment & Plan:   Principal Problem:   Wound of right foot Active Problems:   Dementia ([place])   Coronary artery disease   Hyperlipidemia   Hypertension   Paroxysmal atrial fibrillation (HCC)   CKD (chronic kidney disease), stage IV (HCC)  Right foot ulceration, infected suspicion for early osteomyelitis - IV vancomycin and Rocephin started. -MRI right foot-+ Osteomyelitis. He will need a PICC tomorrow.  - Lower extremity ABI-ordered, if abnormal consult vascular surgery -Routine wound care. - hemoglobin A1c- 6.4 -Lipid panel-   Paroxysmal atrial fibrillation -On Eliquis 5 mg twice daily at home.  On amiodarone. - Lopressor on hold due to slow heart rate.  Chronic kidney disease stage IV - Baseline appears to be close to 2.4.  Closely monitor renal function.  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   DVT prophylaxis: ELiquis Code Status: Full Family Communication:  None at Bedside.  Disposition Plan: IV Abx for Osteo  Consultants:   None  Procedures:   None  Antimicrobials:   Vanc D2  Roc D2   Subjective: Feels ok, does have pain in his RLE at the infection site.   Review of  Systems Otherwise negative except as per HPI, including: General: Denies fever, chills, night sweats or unintended weight loss. Resp: Denies cough, wheezing, shortness of breath. Cardiac: Denies chest pain, palpitations, orthopnea, paroxysmal nocturnal dyspnea. GI: Denies abdominal pain, nausea, vomiting, diarrhea or constipation GU: Denies dysuria, frequency, hesitancy or incontinence MS: Denies muscle aches, joint pain or swelling Neuro: Denies headache, neurologic deficits (focal weakness, numbness, tingling), abnormal gait Psych: Denies anxiety, depression, SI/HI/AVH Skin: Denies new rashes or lesions ID: Denies sick contacts, exotic exposures, travel  Objective: Vitals:   03/14/19 2040 03/14/19 2052 03/15/19 0324 03/15/19 0738  BP: 122/67 122/67 (!) 145/65 125/87  Pulse: (!) 56 (!) 56 (!) 49 (!) 56  Resp: 16  18 16   Temp: 98.6 F (37 C)  98.4 F (36.9 C) 98.6 F (37 C)  TempSrc: Oral  Oral   SpO2:  100% 100% 99%  Weight: 79.5 kg     Height: 6\' 2"  (1.88 m)       Intake/Output Summary (Last 24 hours) at 03/15/2019 0754 Last data filed at 03/15/2019 0323 Gross per 24 hour  Intake 450 ml  Output 250 ml  Net 200 ml   Filed Weights   03/14/19 2040  Weight: 79.5 kg    Examination:  General exam: Appears calm and comfortable  Respiratory system: Clear to auscultation. Respiratory effort normal. Cardiovascular system: S1 & S2 heard, RRR. No JVD, murmurs, rubs, gallops or clicks. No pedal edema. Gastrointestinal system: Abdomen is nondistended, soft and nontender. No organomegaly or masses felt. Normal bowel sounds heard. Central nervous system: Alert and oriented. No focal neurological deficits. Extremities: Symmetric 5 x 5 power. Skin: Right LE lateral  surface ulceration without surrounding erythema.  Psychiatry: Judgement and insight appear normal. Mood & affect appropriate.   Data Reviewed:   CBC: Recent Labs  Lab 03/14/19 1348 03/15/19 0531  WBC 7.6 6.0   NEUTROABS 4.0  --   HGB 11.6* 11.7*  HCT 37.7* 36.3*  MCV 84.0 81.8  PLT 345 409   Basic Metabolic Panel: Recent Labs  Lab 03/14/19 1348 03/15/19 0531  NA 138 139  K 4.7 4.6  CL 110 112*  CO2 20* 21*  GLUCOSE 112* 92  BUN 43* 40*  CREATININE 2.94* 2.61*  CALCIUM 8.8* 8.9   GFR: Estimated Creatinine Clearance: 30.9 mL/min (A) (by C-G formula based on SCr of 2.61 mg/dL (H)). Liver Function Tests: Recent Labs  Lab 03/14/19 1348  AST 43*  ALT 40  ALKPHOS 60  BILITOT 0.7  PROT 7.2  ALBUMIN 2.7*   No results for input(s): LIPASE, AMYLASE in the last 168 hours. No results for input(s): AMMONIA in the last 168 hours. Coagulation Profile: No results for input(s): INR, PROTIME in the last 168 hours. Cardiac Enzymes: No results for input(s): CKTOTAL, CKMB, CKMBINDEX, TROPONINI in the last 168 hours. BNP (last 3 results) No results for input(s): PROBNP in the last 8760 hours. HbA1C: No results for input(s): HGBA1C in the last 72 hours. CBG: No results for input(s): GLUCAP in the last 168 hours. Lipid Profile: No results for input(s): CHOL, HDL, LDLCALC, TRIG, CHOLHDL, LDLDIRECT in the last 72 hours. Thyroid Function Tests: No results for input(s): TSH, T4TOTAL, FREET4, T3FREE, THYROIDAB in the last 72 hours. Anemia Panel: No results for input(s): VITAMINB12, FOLATE, FERRITIN, TIBC, IRON, RETICCTPCT in the last 72 hours. Sepsis Labs: Recent Labs  Lab 03/14/19 1348 03/14/19 1731  LATICACIDVEN 1.2 1.1    Recent Results (from the past 240 hour(s))  SARS Coronavirus 2 (CEPHEID - Performed in [facility] hospital lab), Hosp Order     Status: None   Collection Time: 03/14/19  5:31 PM   Specimen: Nasopharyngeal Swab  Result Value Ref Range Status   SARS Coronavirus 2 NEGATIVE NEGATIVE Final    Comment: (NOTE) If result is NEGATIVE SARS-CoV-2 target nucleic acids are NOT DETECTED. The SARS-CoV-2 RNA is generally detectable in upper and lower  respiratory specimens  during the acute phase of infection. The lowest  concentration of SARS-CoV-2 viral copies this assay can detect is 250  copies / mL. A negative result does not preclude SARS-CoV-2 infection  and should not be used as the sole basis for treatment or other  patient management decisions.  A negative result may occur with  improper specimen collection / handling, submission of specimen other  than nasopharyngeal swab, presence of viral mutation(s) within the  areas targeted by this assay, and inadequate number of viral copies  (<250 copies / mL). A negative result must be combined with clinical  observations, patient history, and epidemiological information. If result is POSITIVE SARS-CoV-2 target nucleic acids are DETECTED. The SARS-CoV-2 RNA is generally detectable in upper and lower  respiratory specimens dur ing the acute phase of infection.  Positive  results are indicative of active infection with SARS-CoV-2.  Clinical  correlation with patient history and other diagnostic information is  necessary to determine patient infection status.  Positive results do  not rule out bacterial infection or co-infection with other viruses. If result is PRESUMPTIVE POSTIVE SARS-CoV-2 nucleic acids MAY BE PRESENT.   A presumptive positive result was obtained on the submitted specimen  and confirmed on repeat  testing.  While 2019 novel coronavirus  (SARS-CoV-2) nucleic acids may be present in the submitted sample  additional confirmatory testing may be necessary for epidemiological  and / or clinical management purposes  to differentiate between  SARS-CoV-2 and other Sarbecovirus currently known to infect humans.  If clinically indicated additional testing with an alternate test  methodology [phone]) is advised. The SARS-CoV-2 RNA is generally  detectable in upper and lower respiratory sp ecimens during the acute  phase of infection. The expected result is Negative. Fact Sheet for Patients:   StrictlyIdeas.no Fact Sheet for Healthcare Providers: BankingDealers.co.za This test is not yet approved or cleared by the [place] FDA and has been authorized for detection and/or diagnosis of SARS-CoV-2 by FDA under an Emergency Use Authorization (EUA).  This EUA will remain in effect (meaning this test can be used) for the duration of the COVID-19 declaration under Section 564(b)(1) of the Act, 21 U.S.C. section 360bbb-3(b)(1), unless the authorization is terminated or revoked sooner. Performed at [facility], [place] [address] [place], [place] [address]   Wound or Superficial Culture     Status: None (Preliminary result)   Collection Time: 03/14/19  6:01 PM   Specimen: Ulcer; Wound  Result Value Ref Range Status   Specimen Description ULCER  Final   Special Requests NONE  Final   Gram Stain   Final    NO WBC SEEN RARE GRAM POSITIVE COCCI Performed at [facility], [address] [place], [place] [address]    Culture PENDING  Incomplete   Report Status PENDING  Incomplete         Radiology Studies: Dg Abd 1 View  Result Date: 03/15/2019 CLINICAL DATA:  For MRI clearance EXAM: ABDOMEN - 1 VIEW COMPARISON:  None. FINDINGS: Nonobstructive bowel gas pattern. Visualized osseous structures are within normal limits. Vascular calcifications. IMPRESSION: Negative. No radiopaque foreign body is seen. Electronically Signed   By: Julian Hy M.D.   On: 03/15/2019 01:50   Dg Chest Port 1 View  Result Date: 03/15/2019 CLINICAL DATA:  For MRI clearance EXAM: PORTABLE CHEST 1 VIEW COMPARISON:  None. FINDINGS: Lungs are clear.  No pleural effusion or pneumothorax. The heart is top-normal in size. Postsurgical changes related to prior CABG. Median sternotomy. IMPRESSION: No evidence of acute cardiopulmonary disease. No radiopaque foreign body is seen. Electronically Signed   By: Julian Hy M.D.   On: 03/15/2019 01:50    Dg Foot Complete Right  Result Date: 03/14/2019 CLINICAL DATA:  Swelling in right foot. EXAM: RIGHT FOOT COMPLETE - 3+ VIEW COMPARISON:  February 20, 2019 FINDINGS: There appears to be a skin defect overlying the base of the fifth metatarsal seen on oblique images. There is mild new irregularity of the underlying fifth metatarsal base. Vascular calcifications. No fractures. Soft tissue swelling. IMPRESSION: There is a skin defect overlying the base of the fifth metatarsal with mild underlying bony irregularity. Findings are suspicious for subtle osteomyelitis. MRI could better assess. Soft tissue swelling. No other abnormalities. Electronically Signed   By: Dorise Bullion III M.D   On: 03/14/2019 15:49        Scheduled Meds: . amiodarone  200 mg Oral BID  . apixaban  5 mg Oral BID  . aspirin EC  81 mg Oral Daily  . atorvastatin  40 mg Oral Daily   Continuous Infusions: . cefTRIAXone (ROCEPHIN)  IV 2 g (03/15/19 0001)  . vancomycin       LOS: 1 day  Time spent= 25 mins    Jeiden Daughtridge Arsenio Loader, MD [facility]  If 7PM-7AM, please contact night-coverage [email or web address] 03/15/2019, 7:54 AM

## 2019-03-16 ENCOUNTER — Inpatient Hospital Stay: Payer: Self-pay

## 2019-03-16 ENCOUNTER — Inpatient Hospital Stay (HOSPITAL_COMMUNITY): Payer: Medicare Other

## 2019-03-16 DIAGNOSIS — E1122 Type 2 diabetes mellitus with diabetic chronic kidney disease: Secondary | ICD-10-CM

## 2019-03-16 DIAGNOSIS — I2510 Atherosclerotic heart disease of native coronary artery without angina pectoris: Secondary | ICD-10-CM

## 2019-03-16 DIAGNOSIS — E785 Hyperlipidemia, unspecified: Secondary | ICD-10-CM

## 2019-03-16 DIAGNOSIS — I480 Paroxysmal atrial fibrillation: Secondary | ICD-10-CM

## 2019-03-16 DIAGNOSIS — I10 Essential (primary) hypertension: Secondary | ICD-10-CM

## 2019-03-16 DIAGNOSIS — L97519 Non-pressure chronic ulcer of other part of right foot with unspecified severity: Secondary | ICD-10-CM

## 2019-03-16 DIAGNOSIS — N183 Chronic kidney disease, stage 3 (moderate): Secondary | ICD-10-CM

## 2019-03-16 DIAGNOSIS — M868X7 Other osteomyelitis, ankle and foot: Secondary | ICD-10-CM

## 2019-03-16 LAB — BASIC METABOLIC PANEL
Anion gap: 7 (ref 5–15)
BUN: 30 mg/dL — ABNORMAL HIGH (ref 8–23)
CO2: 20 mmol/L — ABNORMAL LOW (ref 22–32)
Calcium: 8.4 mg/dL — ABNORMAL LOW (ref 8.9–10.3)
Chloride: 110 mmol/L (ref 98–111)
Creatinine, Ser: 2.32 mg/dL — ABNORMAL HIGH (ref 0.61–1.24)
GFR calc Af Amer: 32 mL/min — ABNORMAL LOW (ref >60)
GFR calc non Af Amer: 28 mL/min — ABNORMAL LOW (ref >60)
Glucose, Bld: 96 mg/dL (ref 70–99)
Potassium: 4.4 mmol/L (ref 3.5–5.1)
Sodium: 137 mmol/L (ref 135–145)

## 2019-03-16 LAB — GLUCOSE, CAPILLARY
Glucose-Capillary: 86 mg/dL (ref 70–99)
Glucose-Capillary: 42 mg/dL — CL (ref 70–99)
Glucose-Capillary: 126 mg/dL — ABNORMAL HIGH (ref 70–99)
Glucose-Capillary: 75 mg/dL (ref 70–99)
Glucose-Capillary: 60 mg/dL — ABNORMAL LOW (ref 70–99)

## 2019-03-16 LAB — MAGNESIUM: Magnesium: 2.1 mg/dL (ref 1.7–2.4)

## 2019-03-16 LAB — CBC
HCT: 34.8 % — ABNORMAL LOW (ref 39.0–52.0)
Hemoglobin: 11.2 g/dL — ABNORMAL LOW (ref 13.0–17.0)
MCH: 26.2 pg (ref 26.0–34.0)
MCHC: 32.2 g/dL (ref 30.0–36.0)
MCV: 81.3 fL (ref 80.0–100.0)
Platelets: 290 10*3/uL (ref 150–400)
RBC: 4.28 MIL/uL (ref 4.22–5.81)
RDW: 15.3 % (ref 11.5–15.5)
WBC: 6.9 10*3/uL (ref 4.0–10.5)
nRBC: 0 % (ref 0.0–0.2)

## 2019-03-16 MED ORDER — CEFAZOLIN SODIUM-DEXTROSE 1-4 GM/50ML-% IV SOLN
1.0000 g | Freq: Three times a day (TID) | INTRAVENOUS | Status: DC
  Administered 2019-03-16 – 2019-03-17 (×3): 1 g via INTRAVENOUS
  Filled 2019-03-16 (×4): qty 50

## 2019-03-16 MED ORDER — INSULIN ASPART 100 UNIT/ML ~~LOC~~ SOLN
0.0000 [IU] | Freq: Three times a day (TID) | SUBCUTANEOUS | Status: DC
  Administered 2019-03-20: 1 [IU] via SUBCUTANEOUS

## 2019-03-16 MED ORDER — DEXTROSE 50 % IV SOLN
INTRAVENOUS | Status: AC
  Administered 2019-03-16: 18:00:00 50 mL
  Filled 2019-03-16: qty 50

## 2019-03-16 MED ORDER — INSULIN ASPART 100 UNIT/ML ~~LOC~~ SOLN: 0.0000 [IU] | Freq: Every day | SUBCUTANEOUS | Status: DC

## 2019-03-16 MED ORDER — DEXTROSE 50 % IV SOLN
INTRAVENOUS | Status: AC
  Administered 2019-03-16: 25 mL
  Filled 2019-03-16: qty 50

## 2019-03-16 MED ORDER — LIVING WELL WITH DIABETES BOOK
Freq: Once | Status: AC
  Administered 2019-03-16: 14:00:00
  Filled 2019-03-16: qty 1

## 2019-03-16 MED ORDER — GLIPIZIDE 5 MG PO TABS
5.0000 mg | ORAL_TABLET | Freq: Every day | ORAL | Status: DC
  Administered 2019-03-16: 5 mg via ORAL
  Filled 2019-03-16: qty 1

## 2019-03-16 NOTE — Plan of Care (Signed)
Problem: Education: Goal: Knowledge of General Education information will improve Description: Including pain rating scale, medication(s)/side effects and non-pharmacologic comfort measures Outcome: Progressing   Problem: Health Behavior/Discharge Planning: Goal: Ability to manage health-related needs will improve Outcome: Progressing   Problem: Clinical Measurements: Goal: Ability to maintain clinical measurements within normal limits will improve Outcome: Progressing Goal: Respiratory complications will improve Outcome: Progressing Goal: Cardiovascular complication will be avoided Outcome: Progressing   Problem: Nutrition: Goal: Adequate nutrition will be maintained Outcome: Progressing   Problem: Coping: Goal: Level of anxiety will decrease Outcome: Progressing   Problem: Elimination: Goal: Will not experience complications related to urinary retention Outcome: Progressing   Problem: Pain Managment: Goal: General experience of comfort will improve Outcome: Progressing   Problem: Safety: Goal: Ability to remain free from injury will improve Outcome: Progressing   Problem: Skin Integrity: Goal: Risk for impaired skin integrity will decrease Outcome: Progressing

## 2019-03-16 NOTE — Progress Notes (Signed)
RN called and updated pt's niece, Raynaldo Falco, of pt status. All questions answered to satisfaction. Will continue to monitor.

## 2019-03-16 NOTE — Progress Notes (Signed)
PROGRESS NOTE    [name]  DDU:[id number] DOB: 1950/10/12 DOA: 03/14/2019 PCP: [name], MD   Brief Narrative:  68 year old with history of moderate dementia with occasional behavioral disturbances, CAD status post MI, paroxysmal A. fib on Eliquis, CKD stage IV, essential hypertension, hyperlipidemia, history of substance use and alcohol use brought to the hospital for evaluation of right lower extremity foot wound.  Currently this has been ongoing for the past almost 2 weeks, diagnosed with bony irregularities concerning for osteomyelitis.   Assessment & Plan:   Principal Problem:   Osteomyelitis of right foot ([place]) Active Problems:   Dementia ([place])   Wound of right foot   Coronary artery disease   Hyperlipidemia   Hypertension   Paroxysmal atrial fibrillation ([facility])   CKD (chronic kidney disease), stage IV ([facility])  Right foot ulceration, infected suspicion for early osteomyelitis -On IV vancomycin and Rocephin, changed to Ancef and monitor. -MRI right foot-+ Osteomyelitis. PICC line orders placed. - Lower extremity ABI-pending., if abnormal consult vascular surgery -Routine wound care. - hemoglobin A1c- 6.4 -Lipid panel- LDL 79  Paroxysmal atrial fibrillation -On Eliquis 5 mg twice daily at home.  On amiodarone. - Lopressor on hold due to slow heart rate.  Chronic kidney disease stage IV - Baseline appears to be close to 2.4.  Closely monitor renal function.  Supposed to follow-up outpatient with nephrology next month  Essential hypertension - Lopressor, lisinopril and hydrochlorothiazide  - on Hold  Hyperlipidemia -Continue statin  New onset diabetes mellitus type 2 -Hemoglobin A1c 6.4.  Diabetic coordinator consult placed.  Insulin sliding scale and Accu-Chek -We will start him on low-dose glipizide.  Niece does not think he will follow strict diet. 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   DVT prophylaxis: ELiquis Code Status: Full Family Communication:  Spoke with his niece Disposition Plan: Cont IV Abx and further eval for his foot ulceration.   Consultants:   None  Procedures:   None  Antimicrobials:   Vanc D3-stopped 7/13  Roc D3-stopped 7/13  Ancef-started 7/13   Subjective: Feels better.  Pain is slightly improved in his right lower extremity.  Review of Systems Otherwise negative except as per HPI, including: Constitutional: NAD, calm, comfortable Eyes: PERRL, lids and conjunctivae normal ENMT: Mucous membranes are moist. Posterior pharynx clear of any exudate or lesions.Normal dentition.  Neck: normal, supple, no masses, no thyromegaly Respiratory: clear to auscultation bilaterally, no wheezing, no crackles. Normal respiratory effort. No accessory muscle use.  Cardiovascular: Regular rate and rhythm, no murmurs / rubs / gallops. No extremity edema. 2+ pedal pulses. No carotid bruits.  Abdomen: no tenderness, no masses palpated. No hepatosplenomegaly. Bowel sounds positive.  Musculoskeletal: no clubbing / cyanosis. No joint deformity upper and lower extremities. Good ROM, no contractures. Normal muscle tone.  Skin: no rashes, lesions, ulcers. No induration Neurologic: CN 2-12 grossly intact. Sensation intact, DTR normal. Strength 5/5 in all 4.  Psychiatric: Normal judgment and insight. Alert and oriented x 3. Normal mood.   Objective: Vitals:   03/15/19 1337 03/15/19 2048 03/16/19 0436 03/16/19 0853  BP: (!) 91/57 (!) 121/58 (!) 146/77 (!) 146/80  Pulse: 63 (!) 58 (!) 58 (!) 59  Resp: 16 16 19 16   Temp: 98.2 F (36.8 C) 98.1 F (36.7 C) 98.3 F (36.8 C) 97.9 F (36.6 C)  TempSrc: Oral Oral Oral Oral  SpO2: 98% 100% 100% 100%  Weight:      Height:  Intake/Output Summary (Last 24 hours) at 03/16/2019 1027 Last data filed at 03/16/2019 0430 Gross per 24 hour  Intake 600 ml  Output 1350 ml  Net -750 ml   Filed Weights    03/14/19 2040  Weight: 79.5 kg    Examination:  Constitutional: NAD, calm, comfortable Eyes: PERRL, lids and conjunctivae normal ENMT: Mucous membranes are moist. Posterior pharynx clear of any exudate or lesions.Normal dentition.  Neck: normal, supple, no masses, no thyromegaly Respiratory: clear to auscultation bilaterally, no wheezing, no crackles. Normal respiratory effort. No accessory muscle use.  Cardiovascular: Regular rate and rhythm, no murmurs / rubs / gallops. No extremity edema. 2+ pedal pulses. No carotid bruits.  Abdomen: no tenderness, no masses palpated. No hepatosplenomegaly. Bowel sounds positive.  Musculoskeletal: no clubbing / cyanosis. No joint deformity upper and lower extremities. Good ROM, no contractures. Normal muscle tone.  Skin: Right lower extremity dressing in place.  ulceration noted. Neurologic: CN 2-12 grossly intact. Sensation intact, DTR normal. Strength 5/5 in all 4.  Psychiatric: Normal judgment and insight. Alert and oriented x 3. Normal mood.   Data Reviewed:   CBC: Recent Labs  Lab 03/14/19 1348 03/15/19 0531 03/16/19 0531  WBC 7.6 6.0 6.9  NEUTROABS 4.0  --   --   HGB 11.6* 11.7* 11.2*  HCT 37.7* 36.3* 34.8*  MCV 84.0 81.8 81.3  PLT 345 323 355   Basic Metabolic Panel: Recent Labs  Lab 03/14/19 1348 03/15/19 0531 03/16/19 0531  NA 138 139 137  K 4.7 4.6 4.4  CL 110 112* 110  CO2 20* 21* 20*  GLUCOSE 112* 92 96  BUN 43* 40* 30*  CREATININE 2.94* 2.61* 2.32*  CALCIUM 8.8* 8.9 8.4*  MG  --   --  2.1   GFR: Estimated Creatinine Clearance: 34.7 mL/min (A) (by C-G formula based on SCr of 2.32 mg/dL (H)). Liver Function Tests: Recent Labs  Lab 03/14/19 1348  AST 43*  ALT 40  ALKPHOS 60  BILITOT 0.7  PROT 7.2  ALBUMIN 2.7*   No results for input(s): LIPASE, AMYLASE in the last 168 hours. No results for input(s): AMMONIA in the last 168 hours. Coagulation Profile: No results for input(s): INR, PROTIME in the last 168  hours. Cardiac Enzymes: No results for input(s): CKTOTAL, CKMB, CKMBINDEX, TROPONINI in the last 168 hours. BNP (last 3 results) No results for input(s): PROBNP in the last 8760 hours. HbA1C: Recent Labs    03/15/19 0922  HGBA1C 6.4*   CBG: No results for input(s): GLUCAP in the last 168 hours. Lipid Profile: Recent Labs    03/15/19 0922  CHOL 130  HDL 33*  LDLCALC 79  TRIG 88  CHOLHDL 3.9   Thyroid Function Tests: No results for input(s): TSH, T4TOTAL, FREET4, T3FREE, THYROIDAB in the last 72 hours. Anemia Panel: No results for input(s): VITAMINB12, FOLATE, FERRITIN, TIBC, IRON, RETICCTPCT in the last 72 hours. Sepsis Labs: Recent Labs  Lab 03/14/19 1348 03/14/19 1731  LATICACIDVEN 1.2 1.1    Recent Results (from the past 240 hour(s))  SARS Coronavirus 2 (CEPHEID - Performed in [facility] hospital lab), Hosp Order     Status: None   Collection Time: 03/14/19  5:31 PM   Specimen: Nasopharyngeal Swab  Result Value Ref Range Status   SARS Coronavirus 2 NEGATIVE NEGATIVE Final    Comment: (NOTE) If result is NEGATIVE SARS-CoV-2 target nucleic acids are NOT DETECTED. The SARS-CoV-2 RNA is generally detectable in upper and lower  respiratory specimens during the  acute phase of infection. The lowest  concentration of SARS-CoV-2 viral copies this assay can detect is 250  copies / mL. A negative result does not preclude SARS-CoV-2 infection  and should not be used as the sole basis for treatment or other  patient management decisions.  A negative result may occur with  improper specimen collection / handling, submission of specimen other  than nasopharyngeal swab, presence of viral mutation(s) within the  areas targeted by this assay, and inadequate number of viral copies  (<250 copies / mL). A negative result must be combined with clinical  observations, patient history, and epidemiological information. If result is POSITIVE SARS-CoV-2 target nucleic acids are  DETECTED. The SARS-CoV-2 RNA is generally detectable in upper and lower  respiratory specimens dur ing the acute phase of infection.  Positive  results are indicative of active infection with SARS-CoV-2.  Clinical  correlation with patient history and other diagnostic information is  necessary to determine patient infection status.  Positive results do  not rule out bacterial infection or co-infection with other viruses. If result is PRESUMPTIVE POSTIVE SARS-CoV-2 nucleic acids MAY BE PRESENT.   A presumptive positive result was obtained on the submitted specimen  and confirmed on repeat testing.  While 2019 novel coronavirus  (SARS-CoV-2) nucleic acids may be present in the submitted sample  additional confirmatory testing may be necessary for epidemiological  and / or clinical management purposes  to differentiate between  SARS-CoV-2 and other Sarbecovirus currently known to infect humans.  If clinically indicated additional testing with an alternate test  methodology [phone]) is advised. The SARS-CoV-2 RNA is generally  detectable in upper and lower respiratory sp ecimens during the acute  phase of infection. The expected result is Negative. Fact Sheet for Patients:  StrictlyIdeas.no Fact Sheet for Healthcare Providers: BankingDealers.co.za This test is not yet approved or cleared by the [place] FDA and has been authorized for detection and/or diagnosis of SARS-CoV-2 by FDA under an Emergency Use Authorization (EUA).  This EUA will remain in effect (meaning this test can be used) for the duration of the COVID-19 declaration under Section 564(b)(1) of the Act, 21 U.S.C. section 360bbb-3(b)(1), unless the authorization is terminated or revoked sooner. Performed at [facility], [place] [address], [place], [place] [address]   Wound or Superficial Culture     Status: None (Preliminary result)   Collection Time: 03/14/19  6:01  PM   Specimen: Ulcer; Wound  Result Value Ref Range Status   Specimen Description ULCER  Final   Special Requests NONE  Final   Gram Stain NO WBC SEEN RARE GRAM POSITIVE COCCI   Final   Culture   Final    CULTURE REINCUBATED FOR BETTER GROWTH Performed at [facility], [address] [place], [place] [address]    Report Status PENDING  Incomplete         Radiology Studies: Dg Abd 1 View  Result Date: 03/15/2019 CLINICAL DATA:  For MRI clearance EXAM: ABDOMEN - 1 VIEW COMPARISON:  None. FINDINGS: Nonobstructive bowel gas pattern. Visualized osseous structures are within normal limits. Vascular calcifications. IMPRESSION: Negative. No radiopaque foreign body is seen. Electronically Signed   By: Julian Hy M.D.   On: 03/15/2019 01:50   Mr Foot Right Wo Contrast  Result Date: 03/15/2019 CLINICAL DATA:  Wound at the lateral aspect of the right midfoot for 2 weeks. Question osteomyelitis. EXAM: MRI OF THE RIGHT FOREFOOT WITHOUT CONTRAST TECHNIQUE: Multiplanar, multisequence MR imaging of  the right forefoot was performed. No intravenous contrast was administered. COMPARISON:  Plain films right foot 03/14/2019 and 02/20/2019. FINDINGS: Bones/Joint/Cartilage There is marrow edema in the periphery of the base of the fifth metatarsal subjacent to a skin wound consistent with osteomyelitis. Edema extends approximately 2.5 cm from the base of the fifth metatarsal distally and approximately 1 cm deep to the lateral cortex. Marrow edema is also seen in the lateral periphery of the head of the talus measuring 2 cm in length by 1 cm transverse. Bone marrow signal is otherwise normal. No joint effusion or evidence of arthropathy. Ligaments Intact. Muscles and Tendons No intramuscular fluid collection.  Intact. Soft tissues Intense subcutaneous edema is present about the dorsum of the foot and visualized ankle. No focal fluid collection. IMPRESSION: Marrow edema in the lateral periphery of the  fifth metatarsal subjacent to a skin wound is consistent with osteomyelitis. Negative for abscess or septic joint. Marrow edema in the lateral periphery of the head of the talus is nonspecific but given its distance from the patient's skin wound, osteomyelitis is unlikely. It may represent reactive or stress change. Intense subcutaneous edema over the dorsum of the foot could be due to dependent change and/or cellulitis. Electronically Signed   By: Inge Rise M.D.   On: 03/15/2019 09:41   Dg Chest Port 1 View  Result Date: 03/15/2019 CLINICAL DATA:  For MRI clearance EXAM: PORTABLE CHEST 1 VIEW COMPARISON:  None. FINDINGS: Lungs are clear.  No pleural effusion or pneumothorax. The heart is top-normal in size. Postsurgical changes related to prior CABG. Median sternotomy. IMPRESSION: No evidence of acute cardiopulmonary disease. No radiopaque foreign body is seen. Electronically Signed   By: Julian Hy M.D.   On: 03/15/2019 01:50   Dg Foot Complete Right  Result Date: 03/14/2019 CLINICAL DATA:  Swelling in right foot. EXAM: RIGHT FOOT COMPLETE - 3+ VIEW COMPARISON:  February 20, 2019 FINDINGS: There appears to be a skin defect overlying the base of the fifth metatarsal seen on oblique images. There is mild new irregularity of the underlying fifth metatarsal base. Vascular calcifications. No fractures. Soft tissue swelling. IMPRESSION: There is a skin defect overlying the base of the fifth metatarsal with mild underlying bony irregularity. Findings are suspicious for subtle osteomyelitis. MRI could better assess. Soft tissue swelling. No other abnormalities. Electronically Signed   By: Dorise Bullion III M.D   On: 03/14/2019 15:49        Scheduled Meds: . amiodarone  200 mg Oral BID  . apixaban  5 mg Oral BID  . aspirin EC  81 mg Oral Daily  . atorvastatin  40 mg Oral Daily   Continuous Infusions: . cefTRIAXone (ROCEPHIN)  IV 2 g (03/16/19 0153)  . vancomycin 1,250 mg (03/15/19 2000)      LOS: 2 days   Time spent= 35 mins    Sunaina Ferrando Arsenio Loader, MD [facility]  If 7PM-7AM, please contact night-coverage [email or web address] 03/16/2019, 10:27 AM

## 2019-03-16 NOTE — Progress Notes (Addendum)
Hypoglycemic Event  CBG: 42  Treatment: dextrose 2mL admin 1714  Symptoms: pt asymptomatic  Follow-up CBG: Time: 1739 CBG Result: 75  Possible Reasons for Event: unknown  Comments/MD notified: Gerlean Ren, MD paged  Dextrose 39mL [id number] per Reesa Chew, MD  1822: rechecked CBG 126  Will continue to monitor.     Racheal Patches, RN

## 2019-03-16 NOTE — Progress Notes (Addendum)
03/16/19 1721  PT Visit Information  Last PT Received On 03/16/19  Assistance Needed +2  History of Present Illness Pt is a 68 y/o male admitted secondary to worsening RLE wound. 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 PMH includes dementia, CAD, MI, HTN, a fib, and CKD.   Precautions  Precautions Fall  Required Braces or Orthoses Other Brace  Other Brace Prevalon boot  Restrictions  Weight Bearing Restrictions No  Home Living  Family/patient expects to be discharged to: Private residence  Living Arrangements Other (Comment) (Niece, [name])  Available Help at Discharge Available 24 hours/day;Family  Type of [place] to enter  Entrance Stairs-Number of Steps 3  Entrance Stairs-Rails None  Home Layout One level  Bathroom Shower/Tub Tub/shower unit  Tax adviser - 2 wheels;BSC  Additional Comments Information from OT note as pt reporting conflicting information. OT confirmed home information and PLOF with niece.   Prior Function  Level of Independence Needs assistance  Gait / Transfers Assistance Needed uses walker at baseline;was ambulating until most recent fall;has fallen 3xin past month;niece calls for someone to assist get pt off floor   ADL's / Homemaking Assistance Needed sponge bathing with assistance from niece, assists with dressing, cooking, driving   Comments pt enjoys watching tv, having conversations;  Communication  Communication No difficulties  Pain Assessment  Pain Assessment Faces  Faces Pain Scale 8  Pain Location R foot   Pain Descriptors / Indicators Discomfort;Guarding;Grimacing  Pain Intervention(s) Limited activity within patient's tolerance;Monitored during session;Repositioned  Cognition  Arousal/Alertness Awake/alert  Behavior During Therapy WFL for tasks assessed/performed  Overall Cognitive Status History of cognitive impairments - at baseline  General Comments Alert to person only. Knew he  was in [place], however, did not know he was in the hospital.   Upper Extremity Assessment  Upper Extremity Assessment Defer to OT evaluation  Lower Extremity Assessment  Lower Extremity Assessment RLE deficits/detail;LLE deficits/detail  RLE Deficits / Details R foot pain secondary to osteomyelitis. Very tender to touch and pt with limited weightshift.   LLE Deficits / Details Pt with L foot drop.   Cervical / Trunk Assessment  Cervical / Trunk Assessment Normal  Bed Mobility  Overal bed mobility Needs Assistance  Bed Mobility Supine to Sit;Sit to Supine  Supine to sit Supervision  Sit to supine Min assist  General bed mobility comments Min A for LLE assist to return to supine.   Transfers  Overall transfer level Needs assistance  Equipment used Rolling walker (2 wheeled)  Transfers Sit to/from Stand  Sit to Stand Mod assist;From elevated surface  General transfer comment Heavy mod A for lift assist and steadying assist to stand. Cues for safe hand placement.   Ambulation/Gait  Ambulation/Gait assistance Min assist  Assistive device Rolling walker (2 wheeled)  General Gait Details Took 2 steps at EOB, however, very limited weightshift noted on RLE secondary to pain, so further mobility deferred.   Balance  Overall balance assessment Needs assistance  Sitting-balance support No upper extremity supported;Feet supported  Sitting balance-Leahy Scale Fair  Standing balance support Bilateral upper extremity supported  Standing balance-Leahy Scale Poor  Standing balance comment Heavy reliance on UE support secondary to R foot pain.   PT - End of Session  Equipment Utilized During Treatment Gait belt  Activity Tolerance Patient limited by pain  Patient left in bed;with call bell/phone within reach;with bed alarm set  Nurse Communication Mobility status  PT  Assessment  PT Recommendation/Assessment Patient needs continued PT services  PT Visit Diagnosis Difficulty in walking, not  elsewhere classified (R26.2);Unsteadiness on feet (R26.81);Muscle weakness (generalized) (M62.81);Pain  Pain - Right/Left Right  Pain - part of body Ankle and joints of foot  PT Problem List Decreased strength;Decreased balance;Decreased activity tolerance;Decreased mobility;Decreased cognition;Decreased knowledge of use of DME;Decreased safety awareness;Decreased knowledge of precautions  PT Plan  PT Frequency (ACUTE ONLY) Min 3X/week  PT Treatment/Interventions (ACUTE ONLY) DME instruction;Gait training;Functional mobility training;Stair training;Therapeutic activities;Therapeutic exercise;Balance training;Patient/family education  AM-PAC PT "6 Clicks" Mobility Outcome Measure (Version 2)  Help needed turning from your back to your side while in a flat bed without using bedrails? 3  Help needed moving from lying on your back to sitting on the side of a flat bed without using bedrails? 3  Help needed moving to and from a bed to a chair (including a wheelchair)? 2  Help needed standing up from a chair using your arms (e.g., wheelchair or bedside chair)? 2  Help needed to walk in hospital room? 1  Help needed climbing 3-5 steps with a railing?  1  6 Click Score 12  Consider Recommendation of Discharge To: CIR/SNF/LTACH  PT Recommendation  Follow Up Recommendations Supervision/Assistance - 24 hour (CIR vs SNF )  PT equipment Standard walker  Individuals Consulted  Consulted and Agree with Results and Recommendations Patient  Acute Rehab PT Goals  Patient Stated Goal to decrease pain   PT Goal Formulation With patient  Time For Goal Achievement 03/30/19  Potential to Achieve Goals Good  PT Time Calculation  PT Start Time (ACUTE ONLY) 1658  PT Stop Time (ACUTE ONLY) 1713  PT Time Calculation (min) (ACUTE ONLY) 15 min  PT General Charges  $$ ACUTE PT VISIT 1 Visit  PT Evaluation  $PT Eval Moderate Complexity 1 Mod   Pt admitted secondary to problem above with deficits below. Pt very  limited secondary to R foot pain this session. Only able to tolerate a few steps at EOB. Required min to heavy mod to complete mobility tasks with RW. Per OT notes, pt family would prefer for pt not to go to SNF. Feel if tolerance for mobility improves, pt would be good candidate for CIR. Will continue to follow acutely to maximize functional mobility independence and safety.   Leighton Ruff, PT, DPT  Acute Rehabilitation Services  Pager: [phone] Office: [phone]

## 2019-03-16 NOTE — Progress Notes (Signed)
Pharmacy Antibiotic Note  [name] is a 68 y.o. male admitted on 03/14/2019 with foot ulcers on R foot. Starting broad spectrum antibiotics for possible osteomyelitis. SCr 2.9, normalized CrCl ~25 ml/min. Patient received one dose of Zosyn in ED. Vanc and Rocephin D/C'd by MD on 7/13. Pharmacy consulted to dose cefazolin.  Scr elevated but trending down 2.94>>2.32. WBC wnl. Afebrile.  Plan: -Start cefazolin 1 gm IV q8h -Monitor renal fx, cultures, VT at steady state   Temp (24hrs), Avg:98.1 F (36.7 C), Min:97.9 F (36.6 C), Max:98.3 F (36.8 C)  Recent Labs  Lab 03/14/19 1348 03/14/19 1731 03/15/19 0531 03/16/19 0531  WBC 7.6  --  6.0 6.9  CREATININE 2.94*  --  2.61* 2.32*  LATICACIDVEN 1.2 1.1  --   --      Antimicrobials this admission: Cefazolin 7/13>> 7/11 vancomycin >7/13 7/11 ceftriaxone >7/13 7/11 zosyn x1  Dose adjustments this admission: N/A  Microbiology results: 7/11 Covid-19: neg 7/11 wound cx:   Mirian Capuchin, 03/16/2019 10:38 AM

## 2019-03-16 NOTE — Progress Notes (Signed)
Orthopedic Tech Progress Note Patient Details:  [name] 09-06-1950 [id number]  Ortho Devices Type of Ortho Device: Prafo boot/shoe Ortho Device/Splint Interventions: Adjustment, Application, Ordered   Post Interventions Patient Tolerated: Well Instructions Provided: Care of device, Adjustment of device   [name] T 03/16/2019, 12:00 PM

## 2019-03-16 NOTE — Consult Note (Signed)
[facility] Nurse wound consult note Reason for Consult:Right foot edema with callous to right heel.  MRI (+) osteomyelitis.  Deferring to vascular service as there is no drainage. Intact callous, dry.  Will order dry dressing and offload pressure with prevalon boot.  Wound type:infectious  Pressure Injury POA: Yes Measurement: 2 cm x 4.5 cm hard callous to right plantar heel Wound bed:dry, calloused Drainage (amount, consistency, odor) none Periwound:edema to right foot Dressing procedure/placement/frequency:Dry dressing to heel.  Prevalon boot to offload pressure.  Will not follow at this time.  Please re-consult if needed.  Domenic Moras MSN, RN, FNP-BC CWON Wound, Ostomy, Continence Nurse Pager [phone]

## 2019-03-16 NOTE — Progress Notes (Signed)
Occupational Therapy Evaluation Patient Details Name: [name] MRN: [id number] DOB: 29-Jun-1951 Today's Date: 03/16/2019    History of Present Illness   68 year old with history of moderate dementia with occasional behavioral disturbances, CAD status post MI, paroxysmal A. fib on Eliquis, CKD stage IV, essential hypertension, hyperlipidemia, history of substance use and alcohol use brought to the hospital for evaluation of right lower extremity foot wound.  Currently this has been ongoing for the past almost 2 weeks, diagnosed with bony irregularities concerning for osteomyelitis.   Clinical Impression   PTA, pt was living at home with his niece, [name], who assisted with ADL/IADL and functional mobility at RW level. Pt currently requires modA for LB ADL and sit<>stand transfer at RW level. Pt unable to weight shift this date, deferred functional mobility. Pt demonstrates cognitive limitations at baseline, appears to be at baseline. Due to decline in current level of function, pt would benefit from acute OT to address established goals to facilitate safe D/C to venue listed below. At this time, recommend SNF follow-up. Pt's niece reports family may not want pt to d/c to SNF but are open to CIR if pt is approved. Pt will benefit from sub-acute therapy to maximize safety and independence with ADL/IADL and functional mobility. Will continue to follow acutely.     Follow Up Recommendations  SNF (CIR pending approval)   Equipment Recommendations  3 in 1 bedside commode    Recommendations for Other Services PT consult     Precautions / Restrictions        Mobility Bed Mobility Overal bed mobility: Needs Assistance Bed Mobility: Supine to Sit;Sit to Supine     Supine to sit: Min guard;HOB elevated Sit to supine: Min assist   General bed mobility comments: minA for LLE management  Transfers Overall transfer level: Needs assistance Equipment used: Rolling walker (2  wheeled) Transfers: Sit to/from Stand Sit to Stand: Mod assist         General transfer comment: modA to powerup into standing;pt unable to shift weight while standing    Balance Overall balance assessment: Needs assistance Sitting-balance support: No upper extremity supported;Feet supported Sitting balance-Leahy Scale: Fair     Standing balance support: Bilateral upper extremity supported Standing balance-Leahy Scale: Poor Standing balance comment: reliant on BUE support;unable to weight shift in standing;unable to tolerate weight in L foot                           ADL either performed or assessed with clinical judgement   ADL Overall ADL's : Needs assistance/impaired Eating/Feeding: Set up;Sitting   Grooming: Set up;Sitting   Upper Body Bathing: Min guard;Sitting   Lower Body Bathing: Moderate assistance;Sit to/from stand   Upper Body Dressing : Minimal assistance;Sitting   Lower Body Dressing: Moderate assistance;Sit to/from stand                 General ADL Comments: did not attempt transfer this date as pt was unable to bear weight through Lfoot and unable to weight shift while standing     Vision         Perception     Praxis      Pertinent Vitals/Pain Pain Assessment: 0-10 Pain Score: 8  Pain Location: L foot Pain Descriptors / Indicators: Discomfort;Guarding;Grimacing Pain Intervention(s): Limited activity within patient's tolerance;Monitored during session     Hand Dominance Right   Extremity/Trunk Assessment Upper Extremity Assessment Upper Extremity Assessment: Overall WFL for tasks  assessed   Lower Extremity Assessment Lower Extremity Assessment: Defer to PT evaluation   Cervical / Trunk Assessment Cervical / Trunk Assessment: Normal   Communication Communication Communication: No difficulties   Cognition Arousal/Alertness: Awake/alert Behavior During Therapy: WFL for tasks assessed/performed Overall Cognitive  Status: History of cognitive impairments - at baseline                                 General Comments: pt oriented to place;not oriented to time;requires vc for safety pt appears to be at baseline cognition   General Comments  VSS throughout;spoke with niece [name] on the phone reports family does not want pt to d/c to SNF, verbalized understanding concern regarding 3 stairs to get into the house    Exercises     Shoulder Instructions      Home Living Family/patient expects to be discharged to:: Private residence Living Arrangements: Other relatives(Niece, [name]) Available Help at Discharge: Available 24 hours/day;Family Type of Home: House Home Access: Stairs to enter [employer] of Steps: 3 Entrance Stairs-Rails: None Home Layout: One level     Bathroom Shower/Tub: Teacher, early years/pre: Standard Bathroom Accessibility: Yes How Accessible: Accessible via walker(neice reports bathroom is tight with walker) Home Equipment: Walker - 2 wheels;Bedside commode   Additional Comments: pt provided information. confirmed by pt's niece, [name]      Prior Functioning/Environment Level of Independence: Needs assistance  Gait / Transfers Assistance Needed: uses walker at baseline;was ambulating until most recent fall;has fallen 3xin past month;niece calls for someone to assist get pt off floor  ADL's / Homemaking Assistance Needed: sponge bathing with assistance from niece, assists with dressing, cooking, driving    Comments: pt enjoys watching tv, having conversations;        OT Problem List: Decreased strength;Decreased activity tolerance;Decreased range of motion;Impaired balance (sitting and/or standing);Decreased cognition;Decreased safety awareness;Decreased knowledge of use of DME or AE;Decreased knowledge of precautions;Pain      OT Treatment/Interventions: Self-care/ADL training;Therapeutic exercise;Energy conservation;DME and/or AE  instruction;Therapeutic activities;Cognitive remediation/compensation;Patient/family education;Balance training    OT Goals(Current goals can be found in the care plan section) Acute Rehab OT Goals Patient Stated Goal: to go home OT Goal Formulation: With patient Time For Goal Achievement: 03/30/19 Potential to Achieve Goals: Good ADL Goals Pt Will Perform Grooming: with supervision;standing Pt Will Perform Upper Body Dressing: with supervision Pt Will Perform Lower Body Dressing: with min assist Pt Will Transfer to Toilet: ambulating;with min guard assist  OT Frequency: Min 2X/week   Barriers to D/C: Inaccessible home environment  3 stairs to get into house       Co-evaluation              AM-PAC OT "6 Clicks" Daily Activity     Outcome Measure Help from another person eating meals?: None Help from another person taking care of personal grooming?: A Little Help from another person toileting, which includes using toliet, bedpan, or urinal?: A Lot Help from another person bathing (including washing, rinsing, drying)?: A Little Help from another person to put on and taking off regular upper body clothing?: A Little Help from another person to put on and taking off regular lower body clothing?: A Lot 6 Click Score: 17   End of Session Equipment Utilized During Treatment: Gait belt;Rolling walker Nurse Communication: Mobility status  Activity Tolerance: Patient tolerated treatment well;Patient limited by pain Patient left: in bed;with call bell/phone within  reach;with bed alarm set  OT Visit Diagnosis: Unsteadiness on feet (R26.81);Other abnormalities of gait and mobility (R26.89);Repeated falls (R29.6);Muscle weakness (generalized) (M62.81);History of falling (Z91.81);Other symptoms and signs involving cognitive function;Pain Pain - Right/Left: Left Pain - part of body: Ankle and joints of foot                Time: 0802-2336 OT Time Calculation (min): 16 min Charges:  OT  General Charges $OT Visit: 1 Visit OT Evaluation $OT Eval Moderate Complexity: [place] OTR/L Acute Rehabilitation Services Office: [place] 03/16/2019, 4:20 PM

## 2019-03-16 NOTE — Progress Notes (Addendum)
Inpatient Diabetes Program Recommendations  AACE/ADA: New Consensus Statement on Inpatient Glycemic Control (2015)  Target Ranges:  Prepandial:   less than 140 mg/dL      Peak postprandial:   less than 180 mg/dL (1-2 hours)      Critically ill patients:  140 - 180 mg/dL   Lab Results  Component Value Date   HGBA1C 6.4 (H) 03/15/2019    Review of Glycemic Control  Diabetes history: New diagnosis this admission  Current orders for Inpatient glycemic control:  Novolog 0-9 units tid + Novolog 0-5 units qhs Glipizide 5 mg Daily  Inpatient Diabetes Program Recommendations:    A1c 6.4% upper range of pre-DM (Diabetes starts at 6.5% per ADA criteria). Due to renal function and that patient has not needed insulin coverage while here, Glipizide may drop patient's glucose too much. May want to consider Tradjenta 5 mg at time of d/c if covered by insurance. However patient may not need orals at this time.  Will see patient.  Thanks,  Tama Headings RN, MSN, BC-ADM Inpatient Diabetes Coordinator Team Pager [phone] (8a-5p)

## 2019-03-16 NOTE — Progress Notes (Signed)
VASCULAR LAB PRELIMINARY  PRELIMINARY  PRELIMINARY  PRELIMINARY  ABIs completed.    Preliminary report:  See CV proc for preliminary results.   Madora Barletta, RVT 03/16/2019, 12:32 PM

## 2019-03-17 ENCOUNTER — Encounter (HOSPITAL_COMMUNITY): Payer: Self-pay | Admitting: *Deleted

## 2019-03-17 DIAGNOSIS — I70234 Atherosclerosis of native arteries of right leg with ulceration of heel and midfoot: Secondary | ICD-10-CM

## 2019-03-17 DIAGNOSIS — E162 Hypoglycemia, unspecified: Secondary | ICD-10-CM

## 2019-03-17 DIAGNOSIS — I739 Peripheral vascular disease, unspecified: Secondary | ICD-10-CM

## 2019-03-17 DIAGNOSIS — N184 Chronic kidney disease, stage 4 (severe): Secondary | ICD-10-CM

## 2019-03-17 DIAGNOSIS — I70244 Atherosclerosis of native arteries of left leg with ulceration of heel and midfoot: Secondary | ICD-10-CM

## 2019-03-17 LAB — CBC
HCT: 36.5 % — ABNORMAL LOW (ref 39.0–52.0)
Hemoglobin: 11.6 g/dL — ABNORMAL LOW (ref 13.0–17.0)
MCH: 26.2 pg (ref 26.0–34.0)
MCHC: 31.8 g/dL (ref 30.0–36.0)
MCV: 82.4 fL (ref 80.0–100.0)
Platelets: 309 10*3/uL (ref 150–400)
RBC: 4.43 MIL/uL (ref 4.22–5.81)
RDW: 15.5 % (ref 11.5–15.5)
WBC: 7.5 10*3/uL (ref 4.0–10.5)
nRBC: 0 % (ref 0.0–0.2)

## 2019-03-17 LAB — AEROBIC CULTURE W GRAM STAIN (SUPERFICIAL SPECIMEN)
Culture: NORMAL
Gram Stain: NONE SEEN

## 2019-03-17 LAB — BASIC METABOLIC PANEL
Anion gap: 9 (ref 5–15)
BUN: 22 mg/dL (ref 8–23)
CO2: 17 mmol/L — ABNORMAL LOW (ref 22–32)
Calcium: 8.8 mg/dL — ABNORMAL LOW (ref 8.9–10.3)
Chloride: 112 mmol/L — ABNORMAL HIGH (ref 98–111)
Creatinine, Ser: 1.87 mg/dL — ABNORMAL HIGH (ref 0.61–1.24)
GFR calc Af Amer: 42 mL/min — ABNORMAL LOW (ref >60)
GFR calc non Af Amer: 36 mL/min — ABNORMAL LOW (ref >60)
Glucose, Bld: 63 mg/dL — ABNORMAL LOW (ref 70–99)
Potassium: 4.4 mmol/L (ref 3.5–5.1)
Sodium: 138 mmol/L (ref 135–145)

## 2019-03-17 LAB — MAGNESIUM: Magnesium: 1.8 mg/dL (ref 1.7–2.4)

## 2019-03-17 LAB — GLUCOSE, CAPILLARY
Glucose-Capillary: 56 mg/dL — ABNORMAL LOW (ref 70–99)
Glucose-Capillary: 66 mg/dL — ABNORMAL LOW (ref 70–99)
Glucose-Capillary: 88 mg/dL (ref 70–99)
Glucose-Capillary: 86 mg/dL (ref 70–99)
Glucose-Capillary: 69 mg/dL — ABNORMAL LOW (ref 70–99)
Glucose-Capillary: 79 mg/dL (ref 70–99)
Glucose-Capillary: 98 mg/dL (ref 70–99)

## 2019-03-17 MED ORDER — GLUCOSE 40 % PO GEL
ORAL | Status: AC
  Administered 2019-03-17: 37.5 g
  Filled 2019-03-17: qty 1

## 2019-03-17 MED ORDER — SODIUM CHLORIDE 0.9% FLUSH
10.0000 mL | Freq: Two times a day (BID) | INTRAVENOUS | Status: DC
  Administered 2019-03-18 – 2019-03-23 (×4): 10 mL

## 2019-03-17 MED ORDER — DEXTROSE 50 % IV SOLN
INTRAVENOUS | Status: AC
  Filled 2019-03-17: qty 50

## 2019-03-17 MED ORDER — GLUCAGON HCL RDNA (DIAGNOSTIC) 1 MG IJ SOLR
1.0000 mg | Freq: Once | INTRAMUSCULAR | Status: AC
  Administered 2019-03-17: 1 mg via INTRAMUSCULAR
  Filled 2019-03-17: qty 1

## 2019-03-17 MED ORDER — GLUCOSE 4 G PO CHEW
CHEWABLE_TABLET | ORAL | Status: AC
  Administered 2019-03-17: 4 g
  Filled 2019-03-17: qty 1

## 2019-03-17 MED ORDER — SODIUM CHLORIDE 0.9% FLUSH: 10.0000 mL | INTRAVENOUS | Status: DC | PRN

## 2019-03-17 MED ORDER — CEFAZOLIN SODIUM-DEXTROSE 2-4 GM/100ML-% IV SOLN
2.0000 g | Freq: Three times a day (TID) | INTRAVENOUS | Status: DC
  Administered 2019-03-17 – 2019-03-20 (×9): 2 g via INTRAVENOUS
  Filled 2019-03-17 (×11): qty 100

## 2019-03-17 NOTE — Progress Notes (Signed)
Peripherally Inserted Central Catheter/Midline Placement  The IV Nurse has discussed with the patient and/or persons authorized to consent for the patient, the purpose of this procedure and the potential benefits and risks involved with this procedure.  The benefits include less needle sticks, lab draws from the catheter, and the patient may be discharged home with the catheter. Risks include, but not limited to, infection, bleeding, blood clot (thrombus formation), and puncture of an artery; nerve damage and irregular heartbeat and possibility to perform a PICC exchange if needed/ordered by physician.  Alternatives to this procedure were also discussed.  Bard Power PICC patient education guide, fact sheet on infection prevention and patient information card has been provided to patient /or left at bedside.    PICC/Midline Placement Documentation  PICC Single Lumen 59/93/57 PICC Right Basilic 45 cm 1 cm (Active)  Indication for Insertion or Continuance of Line Poor Vasculature-patient has had multiple peripheral attempts or PIVs lasting less than 24 hours 03/17/19 1206  Exposed Catheter (cm) 1 cm 03/17/19 1206  Site Assessment Clean;Dry;Intact 03/17/19 1206  Line Status Flushed;Saline locked;Blood return noted 03/17/19 1206  Dressing Type Transparent 03/17/19 1206  Dressing Status Clean;Dry;Intact;Antimicrobial disc in place 03/17/19 1206  Dressing Change Due 03/24/19 03/17/19 1206       Gordan Payment 03/17/2019, 12:07 PM

## 2019-03-17 NOTE — Progress Notes (Signed)
PHARMACY NOTE:  ANTIMICROBIAL RENAL DOSAGE ADJUSTMENT  Current antimicrobial regimen includes a mismatch between antimicrobial dosage and estimated renal function.  As per policy approved by the Pharmacy & Therapeutics and Medical Executive Committees, the antimicrobial dosage will be adjusted accordingly.  Current antimicrobial dosage:  Cefazolin 1 gm IV q8h  Indication: osteomyelitis  Renal Function: Scr 2.32>>1.87  Estimated Creatinine Clearance: 43.1 mL/min (A) (by C-G formula based on SCr of 1.87 mg/dL (H)). []      On intermittent HD, scheduled: []      On CRRT    Antimicrobial dosage has been changed to:  Cefazolin 2 gm IV q8h    Thank you for allowing pharmacy to be a part of this patient's care.  Berenice Bouton, PharmD PGY1 Pharmacy Resident Office phone: [phone]  03/17/2019 8:08 AM

## 2019-03-17 NOTE — Progress Notes (Signed)
PROGRESS NOTE    [name]  VHQ:[id number] DOB: [birth year] DOA: 03/14/2019 PCP: [name], MD   Brief Narrative:  68 year old with history of moderate dementia with occasional behavioral disturbances, CAD status post MI, paroxysmal A. fib on Eliquis, CKD stage IV, essential hypertension, hyperlipidemia, history of substance use and alcohol use brought to the hospital for evaluation of right lower extremity foot wound.  Currently this has been ongoing for the past almost 2 weeks, diagnosed with bony irregularities concerning for osteomyelitis.  His ABIs showed severe vascular disease therefore vascular surgery consulted.  Hospital course also complicated by intermittent hypoglycemia secondary to starting antidiabetic medication.   Assessment & Plan:   Principal Problem:   Osteomyelitis of right foot ([place]) Active Problems:   Dementia ([place])   Wound of right foot   Coronary artery disease   Hyperlipidemia   Hypertension   Paroxysmal atrial fibrillation (HCC)   CKD (chronic kidney disease), stage IV (HCC)  Right foot ulceration, infected suspicion for early osteomyelitis Severe peripheral vascular disease -On IV vancomycin and Rocephin, changed to Ancef and monitor. -MRI right foot-+ Osteomyelitis.  Patient will need PICC line placement - Lower extremity ABI- severe peripheral vascular disease, vascular surgery consulted. -Routine wound care. - hemoglobin A1c- 6.4 -Lipid panel- LDL 79  Paroxysmal atrial fibrillation -On Eliquis twice daily at home.  On amiodarone. - Lopressor on hold due to slow heart rate.  Acute kidney injury on CKD stage IV, baseline creatinine 1.8 - Renal function is improved with IV fluids.  Currently 1.87.  Essential hypertension - Lopressor, lisinopril and hydrochlorothiazide  - on Hold  Hyperlipidemia -Continue statin  New onset diabetes mellitus type 2 Hypoglycemia -Hemoglobin A1c 6.4.  Diabetic coordinator consult placed.  Insulin  sliding scale and Accu-Chek -Hypoglycemia possibly secondary to glipizide.  Discontinued antidiabetic medication. -Closely monitor blood glucose.  Received couple of amp of D50, will give 1 dose of glucagon.  Monitor.  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   DVT prophylaxis: ELiquis Code Status: Full Family Communication: Spoke with Niece, [name] Disposition Plan: Continue hospital stay for IV antibiotics and vascular surgery evaluation.  Consultants:   None  Procedures:   None  Antimicrobials:   Vanc D3-stopped 7/13  Roc D3-stopped 7/13  Ancef-started 7/13>   Subjective: Feels ok, no complaints.   Review of Systems Otherwise negative except as per HPI, including: General = no fevers, chills, dizziness, malaise, fatigue HEENT/EYES = negative for pain, redness, loss of vision, double vision, blurred vision, loss of hearing, sore throat, hoarseness, dysphagia Cardiovascular= negative for chest pain, palpitation, murmurs, lower extremity swelling Respiratory/lungs= negative for shortness of breath, cough, hemoptysis, wheezing, mucus production Gastrointestinal= negative for nausea, vomiting,, abdominal pain, melena, hematemesis Genitourinary= negative for Dysuria, Hematuria, Change in Urinary Frequency MSK = Negative for arthralgia, myalgias, Back Pain, Joint swelling  Neurology= Negative for headache, seizures, numbness, tingling  Psychiatry= Negative for anxiety, depression, suicidal and homocidal ideation Allergy/Immunology= Medication/Food allergy as listed  Skin= Negative for Rash, lesions, ulcers, itching   Objective: Vitals:   03/16/19 1623 03/16/19 2112 03/17/19 0350 03/17/19 0800  BP: 118/62 116/66 130/69 116/70  Pulse: 68 60 68 68  Resp: 18 12 14 14   Temp: 98.2 F (36.8 C) 98.3 F (36.8 C) 98.3 F (36.8 C) 98.4 F (36.9 C)  TempSrc: Oral Oral Oral Oral  SpO2: 100% 100% 100% 100%  Weight:      Height:         Intake/Output Summary (Last 24  hours) at 03/17/2019 1043 Last data filed at 03/17/2019 0900 Gross per 24 hour  Intake 1615.64 ml  Output 1000 ml  Net 615.64 ml   Filed Weights   03/14/19 2040  Weight: 79.5 kg    Examination:  Constitutional: NAD, calm, comfortable Eyes: PERRL, lids and conjunctivae normal ENMT: Mucous membranes are moist. Posterior pharynx clear of any exudate or lesions.Normal dentition.  Neck: normal, supple, no masses, no thyromegaly Respiratory: clear to auscultation bilaterally, no wheezing, no crackles. Normal respiratory effort. No accessory muscle use.  Cardiovascular: Regular rate and rhythm, no murmurs / rubs / gallops. No extremity edema. 2+ pedal pulses. No carotid bruits.  Abdomen: no tenderness, no masses palpated. No hepatosplenomegaly. Bowel sounds positive.  Musculoskeletal: no clubbing / cyanosis. No joint deformity upper and lower extremities. Good ROM, no contractures. Normal muscle tone.  Skin: Right lower extremity ulceration noted with signs of infection around the area. Neurologic: CN 2-12 grossly intact. Sensation intact, DTR normal. Strength 5/5 in all 4.  Psychiatric: Normal judgment and insight. Alert and oriented x 3. Normal mood.    Data Reviewed:   CBC: Recent Labs  Lab 03/14/19 1348 03/15/19 0531 03/16/19 0531 03/17/19 0326  WBC 7.6 6.0 6.9 7.5  NEUTROABS 4.0  --   --   --   HGB 11.6* 11.7* 11.2* 11.6*  HCT 37.7* 36.3* 34.8* 36.5*  MCV 84.0 81.8 81.3 82.4  PLT 345 323 290 629   Basic Metabolic Panel: Recent Labs  Lab 03/14/19 1348 03/15/19 0531 03/16/19 0531 03/17/19 0326  NA 138 139 137 138  K 4.7 4.6 4.4 4.4  CL 110 112* 110 112*  CO2 20* 21* 20* 17*  GLUCOSE 112* 92 96 63*  BUN 43* 40* 30* 22  CREATININE 2.94* 2.61* 2.32* 1.87*  CALCIUM 8.8* 8.9 8.4* 8.8*  MG  --   --  2.1 1.8   GFR: Estimated Creatinine Clearance: 43.1 mL/min (A) (by C-G formula based on SCr of 1.87 mg/dL (H)). Liver Function  Tests: Recent Labs  Lab 03/14/19 1348  AST 43*  ALT 40  ALKPHOS 60  BILITOT 0.7  PROT 7.2  ALBUMIN 2.7*   No results for input(s): LIPASE, AMYLASE in the last 168 hours. No results for input(s): AMMONIA in the last 168 hours. Coagulation Profile: No results for input(s): INR, PROTIME in the last 168 hours. Cardiac Enzymes: No results for input(s): CKTOTAL, CKMB, CKMBINDEX, TROPONINI in the last 168 hours. BNP (last 3 results) No results for input(s): PROBNP in the last 8760 hours. HbA1C: Recent Labs    03/15/19 0922  HGBA1C 6.4*   CBG: Recent Labs  Lab 03/16/19 1822 03/16/19 2159 03/17/19 0738 03/17/19 0834 03/17/19 0934  GLUCAP 126* 60* 56* 66* 88   Lipid Profile: Recent Labs    03/15/19 0922  CHOL 130  HDL 33*  LDLCALC 79  TRIG 88  CHOLHDL 3.9   Thyroid Function Tests: No results for input(s): TSH, T4TOTAL, FREET4, T3FREE, THYROIDAB in the last 72 hours. Anemia Panel: No results for input(s): VITAMINB12, FOLATE, FERRITIN, TIBC, IRON, RETICCTPCT in the last 72 hours. Sepsis Labs: Recent Labs  Lab 03/14/19 1348 03/14/19 1731  LATICACIDVEN 1.2 1.1    Recent Results (from the past 240 hour(s))  SARS Coronavirus 2 (CEPHEID - Performed in [facility] hospital lab), Hosp Order     Status: None   Collection Time: 03/14/19  5:31 PM   Specimen: Nasopharyngeal Swab  Result Value Ref Range Status   SARS Coronavirus 2 NEGATIVE  NEGATIVE Final    Comment: (NOTE) If result is NEGATIVE SARS-CoV-2 target nucleic acids are NOT DETECTED. The SARS-CoV-2 RNA is generally detectable in upper and lower  respiratory specimens during the acute phase of infection. The lowest  concentration of SARS-CoV-2 viral copies this assay can detect is 250  copies / mL. A negative result does not preclude SARS-CoV-2 infection  and should not be used as the sole basis for treatment or other  patient management decisions.  A negative result may occur with  improper specimen  collection / handling, submission of specimen other  than nasopharyngeal swab, presence of viral mutation(s) within the  areas targeted by this assay, and inadequate number of viral copies  (<250 copies / mL). A negative result must be combined with clinical  observations, patient history, and epidemiological information. If result is POSITIVE SARS-CoV-2 target nucleic acids are DETECTED. The SARS-CoV-2 RNA is generally detectable in upper and lower  respiratory specimens dur ing the acute phase of infection.  Positive  results are indicative of active infection with SARS-CoV-2.  Clinical  correlation with patient history and other diagnostic information is  necessary to determine patient infection status.  Positive results do  not rule out bacterial infection or co-infection with other viruses. If result is PRESUMPTIVE POSTIVE SARS-CoV-2 nucleic acids MAY BE PRESENT.   A presumptive positive result was obtained on the submitted specimen  and confirmed on repeat testing.  While 2019 novel coronavirus  (SARS-CoV-2) nucleic acids may be present in the submitted sample  additional confirmatory testing may be necessary for epidemiological  and / or clinical management purposes  to differentiate between  SARS-CoV-2 and other Sarbecovirus currently known to infect humans.  If clinically indicated additional testing with an alternate test  methodology [phone]) is advised. The SARS-CoV-2 RNA is generally  detectable in upper and lower respiratory sp ecimens during the acute  phase of infection. The expected result is Negative. Fact Sheet for Patients:  StrictlyIdeas.no Fact Sheet for Healthcare Providers: BankingDealers.co.za This test is not yet approved or cleared by the [place] FDA and has been authorized for detection and/or diagnosis of SARS-CoV-2 by FDA under an Emergency Use Authorization (EUA).  This EUA will remain in effect  (meaning this test can be used) for the duration of the COVID-19 declaration under Section 564(b)(1) of the Act, 21 U.S.C. section 360bbb-3(b)(1), unless the authorization is terminated or revoked sooner. Performed at [facility], [place] [address], [place], [place] [address]   Wound or Superficial Culture     Status: None (Preliminary result)   Collection Time: 03/14/19  6:01 PM   Specimen: Ulcer; Wound  Result Value Ref Range Status   Specimen Description ULCER  Final   Special Requests NONE  Final   Gram Stain NO WBC SEEN RARE GRAM POSITIVE COCCI   Final   Culture   Final    CULTURE REINCUBATED FOR BETTER GROWTH Performed at [facility], [address] [place], [place] [address]    Report Status PENDING  Incomplete         Radiology Studies: Vas [place] Abi With/wo Tbi  Result Date: 03/16/2019 LOWER EXTREMITY DOPPLER STUDY Indications: Ulceration. High Risk         Hypertension, hyperlipidemia, prior MI, coronary artery Factors:          disease. Other Factors: Paroxysmal A-Fib, on Eliquis, history of subsatance/alcohol                abuse.  Comparison Study: No prior study on file for comparison. Performing Technologist: Sharion Dove RVS  Examination Guidelines: A complete evaluation includes at minimum, Doppler waveform signals and systolic blood pressure reading at the level of bilateral brachial, anterior tibial, and posterior tibial arteries, when vessel segments are accessible. Bilateral testing is considered an integral part of a complete examination. Photoelectric Plethysmograph (PPG) waveforms and toe systolic pressure readings are included as required and additional duplex testing as needed. Limited examinations for reoccurring indications may be performed as noted.  ABI Findings: +---------+------------------+-----+-------------------+--------+ Right    Rt Pressure (mmHg)IndexWaveform           Comment   +---------+------------------+-----+-------------------+--------+ Brachial 130                    triphasic                   +---------+------------------+-----+-------------------+--------+ PTA      28                0.22 dampened monophasic         +---------+------------------+-----+-------------------+--------+ DP       47                0.36 dampened monophasic         +---------+------------------+-----+-------------------+--------+ Great Toe0                 0.00                             +---------+------------------+-----+-------------------+--------+ +---------+------------------+-----+-------------------+-------+ Left     Lt Pressure (mmHg)IndexWaveform           Comment +---------+------------------+-----+-------------------+-------+ Brachial 116                    triphasic                  +---------+------------------+-----+-------------------+-------+ PTA      72                0.55 dampened monophasic        +---------+------------------+-----+-------------------+-------+ DP       33                0.25 dampened monophasic        +---------+------------------+-----+-------------------+-------+ Great Toe0                 0.00                            +---------+------------------+-----+-------------------+-------+ +-------+-----------+-----------+------------+------------+ ABI/TBIToday's ABIToday's TBIPrevious ABIPrevious TBI +-------+-----------+-----------+------------+------------+ Right  0.36       0                                   +-------+-----------+-----------+------------+------------+ Left   0.55       0                                   +-------+-----------+-----------+------------+------------+  Summary: Right: Resting right ankle-brachial index indicates severe right lower extremity arterial disease. The right toe-brachial index is abnormal. Left: Resting left ankle-brachial index indicates moderate left lower  extremity arterial disease. The left toe-brachial index is abnormal.  *See table(s) above for measurements and observations.  Electronically signed by Monica Martinez MD on 03/16/2019  at 5:22:28 PM.    Final    [place] Ekg Site Rite  Result Date: 03/16/2019 If Site Rite image not attached, placement could not be confirmed due to current cardiac rhythm.       Scheduled Meds: . amiodarone  200 mg Oral BID  . apixaban  5 mg Oral BID  . aspirin EC  81 mg Oral Daily  . atorvastatin  40 mg Oral Daily  . dextrose      . insulin aspart  0-5 Units Subcutaneous QHS  . insulin aspart  0-9 Units Subcutaneous TID WC   Continuous Infusions: .  ceFAZolin (ANCEF) IV       LOS: 3 days   Time spent= 40 mins    Gredmarie Delange Arsenio Loader, MD [facility]  If 7PM-7AM, please contact night-coverage [email or web address] 03/17/2019, 10:43 AM

## 2019-03-17 NOTE — Progress Notes (Signed)
Pt's blood sugar low all day, provider has been updated throughout the day.

## 2019-03-17 NOTE — Consult Note (Addendum)
Hospital Consult    Reason for Consult:  Critical limb ischemia  Requesting Physician:  Dr. Reesa Chew MRN #:  [id number]  History of Present Illness: This is a 68 y.o. male with past medical history significant for atrial fibrillation on Eliquis, CAD status post MI and history of CABG, hypertension, hyperlipidemia, and moderate dementia.  He is being seen in consultation for evaluation of critical limb ischemia of bilateral lower extremities.  He is a poor historian and asked me to direct a lot of my questioning towards his caretaker/aunt.  Work-up has included ABIs which demonstrate right ABI of 0.33 and left ABI of 0.5.  Work-up is also included an MRI of right foot showing osteomyelitis of right fifth metatarsal.  Patient states he is ambulatory however has severe foot drop of left foot.  He denies any rest pain bilateral lower extremities however says that the wound on his right foot is becoming more painful.  He is unable to give me a reliable timeline however states wounds have been there for over a month.  It should also be noted that patient has chronic kidney disease with a baseline creatinine of 2.  Past Medical History:  Diagnosis Date  . Alcoholism ([place])   . Coronary artery disease   . Dementia ([place])   . Hyperlipidemia   . Hypertension   . Paroxysmal atrial fibrillation (HCC)   . Stroke [facility])     Past Surgical History:  Procedure Laterality Date  . heart bypass      No Known Allergies  Prior to Admission medications   Medication Sig Start Date End Date Taking? Authorizing Provider  amiodarone (PACERONE) 200 MG tablet Take 1 tablet (200 mg total) by mouth 2 (two) times daily. 02/19/19  Yes Patwardhan, Manish J, MD  apixaban (ELIQUIS) 5 MG TABS tablet Take 5 mg by mouth 2 (two) times daily with a meal.   Yes [provider]  aspirin EC 81 MG tablet Take 81 mg by mouth daily.   Yes [provider]  atorvastatin (LIPITOR) 40 MG tablet Take 40 mg by mouth at  bedtime.    Yes [provider]  folic acid (FOLVITE) 1 MG tablet Take 1 mg by mouth daily.    Yes [provider]  lisinopril-hydrochlorothiazide (PRINZIDE,ZESTORETIC) 20-12.5 MG tablet Take 1 tablet by mouth daily.   Yes [provider]  metoprolol succinate (TOPROL-XL) 25 MG 24 hr tablet Take 25 mg by mouth 2 (two) times daily with a meal.  02/19/19  Yes [provider]    Social History   Socioeconomic History  . Marital status: Single    Spouse name: Not on file  . Number of children: 2  . Years of education: Not on file  . Highest education level: Not on file  Occupational History  . Occupation: Retired  Scientific laboratory technician  . Financial resource strain: Not on file  . Food insecurity    Worry: Not on file    Inability: Not on file  . Transportation needs    Medical: Not on file    Non-medical: Not on file  Tobacco Use  . Smoking status: Former Research scientist (life sciences)  . Smokeless tobacco: Never Used  Substance and Sexual Activity  . Alcohol use: Not Currently  . Drug use: Never  . Sexual activity: Not Currently  Lifestyle  . Physical activity    Days per week: Not on file    Minutes per session: Not on file  . Stress: Not on file  Relationships  . Social Herbalist on phone: Not on file    Gets together: Not on file    Attends religious service: Not on file    Active member of club or organization: Not on file    Attends meetings of clubs or organizations: Not on file    Relationship status: Not on file  . Intimate partner violence    Fear of current or ex partner: Not on file    Emotionally abused: Not on file    Physically abused: Not on file    Forced sexual activity: Not on file  Other Topics Concern  . Not on file  Social History Narrative   Pt lives in single story home with his niece and her significant other   Has 2 children   Pt unsure of level of education - knows that he started high school and that he did not finish.   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      Family History  Problem Relation Age of Onset  . Lung cancer Sister   . Heart disease Brother   . Diabetes Sister   . Clotting disorder Sister   . Heart disease Sister   . Colon cancer Neg Hx   . Esophageal cancer Neg Hx     ROS: Otherwise negative unless mentioned in HPI  Physical Examination  Vitals:   03/17/19 0350 03/17/19 0800  BP: 130/69 116/70  Pulse: 68 68  Resp: 14 14  Temp: 98.3 F (36.8 C) 98.4 F (36.9 C)  SpO2: 100% 100%   Body mass index is 22.5 kg/m.  General:  WDWN in NAD Gait: Not observed HENT: WNL, normocephalic Pulmonary: normal non-labored breathing, without Rales, rhonchi,  wheezing Cardiac: regular Abdomen:  soft, NT/ND, no masses Skin: without rashes Vascular Exam/Pulses: Palpable and symmetrical femoral pulses, no palpable popliteal pulse, feet symmetrically warm Extremities: Right heel and lateral foot necrotic ulceration with surrounding erythema and soft tissue edema; small necrotic ulcer without drainage left lateral heel Musculoskeletal: no muscle wasting or atrophy  Neurologic: Baseline dementia Psychiatric:  The pt has Normal affect. Lymph:  Unremarkable  CBC    Component Value Date/Time   WBC 7.5 03/17/2019 0326   RBC 4.43 03/17/2019 0326   HGB 11.6 (L) 03/17/2019 0326   HCT 36.5 (L) 03/17/2019 0326   PLT 309 03/17/2019 0326   MCV 82.4 03/17/2019 0326   MCH 26.2 03/17/2019 0326   MCHC 31.8 03/17/2019 0326   RDW 15.5 03/17/2019 0326   LYMPHSABS 3.0 03/14/2019 1348   MONOABS 0.5 03/14/2019 1348   EOSABS 0.1 03/14/2019 1348   BASOSABS 0.0 03/14/2019 1348    BMET    Component Value Date/Time   NA 138 03/17/2019 0326   K 4.4 03/17/2019 0326   CL 112 (H) 03/17/2019 0326   CO2 17 (L) 03/17/2019 0326   GLUCOSE 63 (L) 03/17/2019 0326   BUN 22 03/17/2019 0326   CREATININE 1.87 (H) 03/17/2019 0326   CALCIUM 8.8 (L) 03/17/2019 0326   GFRNONAA 36 (L) 03/17/2019 0326   GFRAA 42 (L)  03/17/2019 0326    COAGS: No results found for: INR, PROTIME   Non-Invasive Vascular Imaging:   Right ABI 0.33 Left ABI 0.5  Right foot MRI demonstrating osteomyelitis right fifth metatarsal   ASSESSMENT/PLAN: This is a 68 y.o. male with critical limb ischemia and wounds of bilateral lower extremities  -Poor historian directing questioning to caregiver/aunt who is now present during exam -Patient states he  is ambulatory with a walker -Based on chart review and clinical picture patient does not seem like he would be a great candidate for vascular reconstruction however this will be discussed with Dr. Trula Slade -Story complicated with CKD; would not want to worsen renal function with contrast load for CTA; will discuss utility of CO2 contrast angiography with Dr. Trula Slade -Case was discussed with hospitalist who would prefer to place a PICC line for osteomyelitis however will hold off if amputation is recommended -Dr. Trula Slade will evaluate the patient later today and provide further treatment plans   Dagoberto Ligas PA-C [facility] [phone]  I agree with the above.  I have seen and evaluated the patient and agree with the above assessment and plan.  This is a 69 year old gentleman who I have been asked to see for evaluation of bilateral lower extremity ulcers in the setting of severe peripheral vascular disease.  He also has a history of chronic renal insufficiency, with a creatinine of 1.87 today which is down from a peak of 2.94.  He has history of coronary artery disease, status post CABG.  He is on Eliquis for atrial fibrillation.  I would recommend continuing IV hydration so that we can consider angiography later this week.  I would stop his Eliquis and place him on IV heparin in anticipation of angiography.  He needs to have pressure offloading on both his feet as these are most likely ulcer secondary to pressure.  He is at risk for limb loss given his poor  vascular disease and ulcers.  Annamarie Major

## 2019-03-17 NOTE — Progress Notes (Signed)
Spoke to nurse informed her that the PICC will be placed later today. [place] [employer]

## 2019-03-17 NOTE — Plan of Care (Signed)
  Problem: Education: Goal: Knowledge of General Education information will improve Description Including pain rating scale, medication(s)/side effects and non-pharmacologic comfort measures Outcome: Progressing   

## 2019-03-17 NOTE — Progress Notes (Signed)
Inpatient Diabetes Program Recommendations  AACE/ADA: New Consensus Statement on Inpatient Glycemic Control (2015)  Target Ranges:  Prepandial:   less than 140 mg/dL      Peak postprandial:   less than 180 mg/dL (1-2 hours)      Critically ill patients:  140 - 180 mg/dL   Spoke with patient at bedside. Patient seems to think that he can make changes to what he eats and drink at home. Explained to him that we had him on medication for his glucose here in the hospital. Told patient to follow up with PCP and that he and his daughter will need to spot check his glucose at home.  Thanks,  Tama Headings RN, MSN, BC-ADM Inpatient Diabetes Coordinator Team Pager [phone] (8a-5p)

## 2019-03-18 LAB — CBC
HCT: 33 % — ABNORMAL LOW (ref 39.0–52.0)
Hemoglobin: 10.8 g/dL — ABNORMAL LOW (ref 13.0–17.0)
MCH: 26.3 pg (ref 26.0–34.0)
MCHC: 32.7 g/dL (ref 30.0–36.0)
MCV: 80.5 fL (ref 80.0–100.0)
Platelets: 279 10*3/uL (ref 150–400)
RBC: 4.1 MIL/uL — ABNORMAL LOW (ref 4.22–5.81)
RDW: 15.4 % (ref 11.5–15.5)
WBC: 7.3 10*3/uL (ref 4.0–10.5)
nRBC: 0 % (ref 0.0–0.2)

## 2019-03-18 LAB — GLUCOSE, CAPILLARY
Glucose-Capillary: 109 mg/dL — ABNORMAL HIGH (ref 70–99)
Glucose-Capillary: 67 mg/dL — ABNORMAL LOW (ref 70–99)
Glucose-Capillary: 100 mg/dL — ABNORMAL HIGH (ref 70–99)
Glucose-Capillary: 97 mg/dL (ref 70–99)
Glucose-Capillary: 147 mg/dL — ABNORMAL HIGH (ref 70–99)

## 2019-03-18 LAB — BASIC METABOLIC PANEL
Anion gap: 6 (ref 5–15)
BUN: 16 mg/dL (ref 8–23)
CO2: 20 mmol/L — ABNORMAL LOW (ref 22–32)
Calcium: 8.7 mg/dL — ABNORMAL LOW (ref 8.9–10.3)
Chloride: 110 mmol/L (ref 98–111)
Creatinine, Ser: 1.8 mg/dL — ABNORMAL HIGH (ref 0.61–1.24)
GFR calc Af Amer: 44 mL/min — ABNORMAL LOW (ref >60)
GFR calc non Af Amer: 38 mL/min — ABNORMAL LOW (ref >60)
Glucose, Bld: 97 mg/dL (ref 70–99)
Potassium: 4.4 mmol/L (ref 3.5–5.1)
Sodium: 136 mmol/L (ref 135–145)

## 2019-03-18 LAB — MAGNESIUM: Magnesium: 1.7 mg/dL (ref 1.7–2.4)

## 2019-03-18 MED ORDER — HEPARIN (PORCINE) 25000 UT/250ML-% IV SOLN
1050.0000 [IU]/h | INTRAVENOUS | Status: DC
  Administered 2019-03-18: 1200 [IU]/h via INTRAVENOUS
  Filled 2019-03-18: qty 250

## 2019-03-18 NOTE — Progress Notes (Signed)
PROGRESS NOTE    [name]  YFV:[id number] DOB: [birth year] DOA: 03/14/2019 PCP: [name], MD   Brief Narrative:  Patient is a 68 year old with history of moderate dementia with occasional behavioral disturbances, CAD status post MI, paroxysmal A. fib on Eliquis, CKD stage IV, essential hypertension, hyperlipidemia, history of substance use and alcohol use brought to the hospital for evaluation of right lower extremity foot wound.  Currently this has been ongoing for the past almost 2 weeks, diagnosed with bony irregularities concerning for osteomyelitis.  His ABIs showed severe vascular disease therefore vascular surgery consulted.  Hospital course also complicated by intermittent hypoglycemia secondary to starting antidiabetic medication. Vascular surgery planning for arteriogram on Friday.  I have also requested orthopedics consultation with Dr. Sharol Given for possible amputation if required.He will see him on Saturday.  Assessment & Plan:   Principal Problem:   Osteomyelitis of right foot ([place]) Active Problems:   Dementia ([place])   Wound of right foot   Coronary artery disease   Hyperlipidemia   Hypertension   Paroxysmal atrial fibrillation (HCC)   CKD (chronic kidney disease), stage IV (HCC)   Right foot ulceration: Patient presented with dry gangrenous changes, ulcer of the right lateral foot and  heel.  MRI showed marrow edema in the lateral periphery of the fifth metatarsal subjacent to a skin wound is consistent with osteomyelitis.  Currently on cefazolin.  Patient is not septic. Continue wound care.  Hemoglobin A1c of 6.4.  Lipid panel showed LDL of 79  Severe peripheral vascular disease: As per lower extremity ABI.  Vascular surgery doing arteriogram with possible intervention on Friday.  Paroxysmal A. fib: On Eliquis at home.  Eliquis held for procedure on Friday.  Started on heparin.  On amiodarone also.  Lopressor held due to bradycardia  AKI on CKD stage IV: Kidney  function improved with IV fluids.  Currently kidney function is at baseline.  Hypertension: Currently antihypertensives are on hold.  Blood pressure stable.  Hyperlipidemia: Continue statin  Diabetes type 2/hypoglycemia: HbA1C of 6.4.  Continue sliding scale insulin for now.  He was started on  glipizide .  Hypoglycemia suspected due to glipizide.Now stopped.  Moderate vascular dementia with behavioral changes: Continue supportive care.  Patient is intermittently confused.  He says he lives with his niece.              DVT prophylaxis: Heparin Code Status: Full Family Communication: None Disposition Plan: Likely home after vascular/orthopedic intervention   Consultants: Vascular surgery, orthopedics  Procedures: None  Antimicrobials:  Anti-infectives (From admission, onward)   Start     Dose/Rate Route Frequency Ordered Stop   03/17/19 1400  ceFAZolin (ANCEF) IVPB 2g/100 mL premix     2 g 200 mL/hr over 30 Minutes Intravenous Every 8 hours 03/17/19 0810     03/16/19 1400  ceFAZolin (ANCEF) IVPB 1 g/50 mL premix  Status:  Discontinued     1 g 100 mL/hr over 30 Minutes Intravenous Every 8 hours 03/16/19 1043 03/17/19 0810   03/15/19 2000  vancomycin (VANCOCIN) 1,250 mg in sodium chloride 0.9 % 250 mL IVPB  Status:  Discontinued     1,250 mg 166.7 mL/hr over 90 Minutes Intravenous Every 24 hours 03/14/19 1844 03/16/19 1036   03/14/19 2300  cefTRIAXone (ROCEPHIN) 2 g in sodium chloride 0.9 % 100 mL IVPB  Status:  Discontinued     2 g 200 mL/hr over 30 Minutes Intravenous Every 24 hours 03/14/19 1837 03/16/19 1036   03/14/19 1930  vancomycin (VANCOCIN) 2,000 mg in sodium chloride 0.9 % 500 mL IVPB     2,000 mg 250 mL/hr over 120 Minutes Intravenous  Once 03/14/19 1844 03/14/19 2159   03/14/19 1700  piperacillin-tazobactam (ZOSYN) IVPB 2.25 g     2.25 g 100 mL/hr over 30 Minutes Intravenous  Once 03/14/19 1614 03/14/19 1800      Subjective:  Patient seen and examined  the bedside this afternoon.  Currently hemodynamically stable.  Looks comfortable.  Denies any complaints.  Objective: Vitals:   03/17/19 2100 03/18/19 0517 03/18/19 0755 03/18/19 1423  BP: (!) 135/117 138/87 (!) 140/103 136/84  Pulse: 75 85 76 80  Resp: 18 20 16 18   Temp: 98.9 F (37.2 C) 98.2 F (36.8 C) 98.8 F (37.1 C) 98.6 F (37 C)  TempSrc: Oral Oral Oral Oral  SpO2: 100% 100% 100% 100%  Weight:      Height:        Intake/Output Summary (Last 24 hours) at 03/18/2019 1433 Last data filed at 03/18/2019 0800 Gross per 24 hour  Intake 480 ml  Output 1100 ml  Net -620 ml   Filed Weights   03/14/19 2040  Weight: 79.5 kg    Examination:  General exam: Appeared comfortable HEENT:PERRL,Oral mucosa moist, Ear/Nose normal on gross exam Respiratory system: Bilateral equal air entry, normal vesicular breath sounds, no wheezes or crackles  Cardiovascular system: S1 & S2 heard, RRR. No JVD, murmurs, rubs, gallops or clicks. No pedal edema. Gastrointestinal system: Abdomen is nondistended, soft and nontender. No organomegaly or masses felt. Normal bowel sounds heard. Central nervous system: Alert and oriented. No focal neurological deficits. Extremities: Poor peripheral pulses skin: Ulcer on the lateral right foot and right heel  Data Reviewed: I have personally reviewed following labs and imaging studies  CBC: Recent Labs  Lab 03/14/19 1348 03/15/19 0531 03/16/19 0531 03/17/19 0326 03/18/19 0352  WBC 7.6 6.0 6.9 7.5 7.3  NEUTROABS 4.0  --   --   --   --   HGB 11.6* 11.7* 11.2* 11.6* 10.8*  HCT 37.7* 36.3* 34.8* 36.5* 33.0*  MCV 84.0 81.8 81.3 82.4 80.5  PLT 345 323 290 309 638   Basic Metabolic Panel: Recent Labs  Lab 03/14/19 1348 03/15/19 0531 03/16/19 0531 03/17/19 0326 03/18/19 0352  NA 138 139 137 138 136  K 4.7 4.6 4.4 4.4 4.4  CL 110 112* 110 112* 110  CO2 20* 21* 20* 17* 20*  GLUCOSE 112* 92 96 63* 97  BUN 43* 40* 30* 22 16  CREATININE 2.94*  2.61* 2.32* 1.87* 1.80*  CALCIUM 8.8* 8.9 8.4* 8.8* 8.7*  MG  --   --  2.1 1.8 1.7   GFR: Estimated Creatinine Clearance: 44.8 mL/min (A) (by C-G formula based on SCr of 1.8 mg/dL (H)). Liver Function Tests: Recent Labs  Lab 03/14/19 1348  AST 43*  ALT 40  ALKPHOS 60  BILITOT 0.7  PROT 7.2  ALBUMIN 2.7*   No results for input(s): LIPASE, AMYLASE in the last 168 hours. No results for input(s): AMMONIA in the last 168 hours. Coagulation Profile: No results for input(s): INR, PROTIME in the last 168 hours. Cardiac Enzymes: No results for input(s): CKTOTAL, CKMB, CKMBINDEX, TROPONINI in the last 168 hours. BNP (last 3 results) No results for input(s): PROBNP in the last 8760 hours. HbA1C: No results for input(s): HGBA1C in the last 72 hours. CBG: Recent Labs  Lab 03/17/19 1719 03/17/19 2150 03/18/19 0750 03/18/19 0820 03/18/19 1217  GLUCAP 79  98 67* 109* 100*   Lipid Profile: No results for input(s): CHOL, HDL, LDLCALC, TRIG, CHOLHDL, LDLDIRECT in the last 72 hours. Thyroid Function Tests: No results for input(s): TSH, T4TOTAL, FREET4, T3FREE, THYROIDAB in the last 72 hours. Anemia Panel: No results for input(s): VITAMINB12, FOLATE, FERRITIN, TIBC, IRON, RETICCTPCT in the last 72 hours. Sepsis Labs: Recent Labs  Lab 03/14/19 1348 03/14/19 1731  LATICACIDVEN 1.2 1.1    Recent Results (from the past 240 hour(s))  SARS Coronavirus 2 (CEPHEID - Performed in [facility] hospital lab), Hosp Order     Status: None   Collection Time: 03/14/19  5:31 PM   Specimen: Nasopharyngeal Swab  Result Value Ref Range Status   SARS Coronavirus 2 NEGATIVE NEGATIVE Final    Comment: (NOTE) If result is NEGATIVE SARS-CoV-2 target nucleic acids are NOT DETECTED. The SARS-CoV-2 RNA is generally detectable in upper and lower  respiratory specimens during the acute phase of infection. The lowest  concentration of SARS-CoV-2 viral copies this assay can detect is 250  copies / mL. A  negative result does not preclude SARS-CoV-2 infection  and should not be used as the sole basis for treatment or other  patient management decisions.  A negative result may occur with  improper specimen collection / handling, submission of specimen other  than nasopharyngeal swab, presence of viral mutation(s) within the  areas targeted by this assay, and inadequate number of viral copies  (<250 copies / mL). A negative result must be combined with clinical  observations, patient history, and epidemiological information. If result is POSITIVE SARS-CoV-2 target nucleic acids are DETECTED. The SARS-CoV-2 RNA is generally detectable in upper and lower  respiratory specimens dur ing the acute phase of infection.  Positive  results are indicative of active infection with SARS-CoV-2.  Clinical  correlation with patient history and other diagnostic information is  necessary to determine patient infection status.  Positive results do  not rule out bacterial infection or co-infection with other viruses. If result is PRESUMPTIVE POSTIVE SARS-CoV-2 nucleic acids MAY BE PRESENT.   A presumptive positive result was obtained on the submitted specimen  and confirmed on repeat testing.  While 2019 novel coronavirus  (SARS-CoV-2) nucleic acids may be present in the submitted sample  additional confirmatory testing may be necessary for epidemiological  and / or clinical management purposes  to differentiate between  SARS-CoV-2 and other Sarbecovirus currently known to infect humans.  If clinically indicated additional testing with an alternate test  methodology [phone]) is advised. The SARS-CoV-2 RNA is generally  detectable in upper and lower respiratory sp ecimens during the acute  phase of infection. The expected result is Negative. Fact Sheet for Patients:  StrictlyIdeas.no Fact Sheet for Healthcare Providers: BankingDealers.co.za This test is not  yet approved or cleared by the [place] FDA and has been authorized for detection and/or diagnosis of SARS-CoV-2 by FDA under an Emergency Use Authorization (EUA).  This EUA will remain in effect (meaning this test can be used) for the duration of the COVID-19 declaration under Section 564(b)(1) of the Act, 21 U.S.C. section 360bbb-3(b)(1), unless the authorization is terminated or revoked sooner. Performed at [facility], [place] [address], [place], [place] [address]   Wound or Superficial Culture     Status: None   Collection Time: 03/14/19  6:01 PM   Specimen: Ulcer; Wound  Result Value Ref Range Status   Specimen Description ULCER  Final   Special Requests NONE  Final  Gram Stain NO WBC SEEN RARE GRAM POSITIVE COCCI   Final   Culture   Final    NORMAL SKIN FLORA Performed at [facility], [place] [address] [place], [place] [address]    Report Status 03/17/2019 FINAL  Final         Radiology Studies: No results found.      Scheduled Meds: . amiodarone  200 mg Oral BID  . aspirin EC  81 mg Oral Daily  . atorvastatin  40 mg Oral Daily  . insulin aspart  0-5 Units Subcutaneous QHS  . insulin aspart  0-9 Units Subcutaneous TID WC  . sodium chloride flush  10-40 mL Intracatheter Q12H   Continuous Infusions: .  ceFAZolin (ANCEF) IV 2 g (03/18/19 1216)  . heparin       LOS: 4 days    Time spent: 35 mins.More than 50% of that time was spent in counseling and/or coordination of care.      Shelly Coss, MD [facility] Pager [phone]  If 7PM-7AM, please contact night-coverage [email or web address] Password [facility] 03/18/2019, 2:33 PM

## 2019-03-18 NOTE — Plan of Care (Signed)
  Problem: Education: Goal: Knowledge of General Education information will improve Description Including pain rating scale, medication(s)/side effects and non-pharmacologic comfort measures Outcome: Progressing   

## 2019-03-18 NOTE — Progress Notes (Signed)
ANTICOAGULATION CONSULT NOTE - Initial Consult  Pharmacy Consult for Heparin Indication: atrial fibrillation while apixaban on hold  No Known Allergies  Patient Measurements: Height: 6\' 2"  (188 cm) Weight: 175 lb 4.3 oz (79.5 kg) IBW/kg (Calculated) : 82.2 Heparin Dosing Weight: 79.5 kg  Vital Signs: Temp: 98.8 F (37.1 C) (07/15 0755) Temp Source: Oral (07/15 0755) BP: 140/103 (07/15 0755) Pulse Rate: 76 (07/15 0755)  Labs: Recent Labs    03/16/19 0531 03/17/19 0326 03/18/19 0352  HGB 11.2* 11.6* 10.8*  HCT 34.8* 36.5* 33.0*  PLT 290 309 279  CREATININE 2.32* 1.87* 1.80*    Estimated Creatinine Clearance: 44.8 mL/min (A) (by C-G formula based on SCr of 1.8 mg/dL (H)).   Medical History: Past Medical History:  Diagnosis Date  . Alcoholism ([place])   . Coronary artery disease   . Dementia ([place])   . Hyperlipidemia   . Hypertension   . Paroxysmal atrial fibrillation (HCC)   . Stroke [facility])      Assessment: 68 yo male with Afib on apixaban PTA admitted with limb ischemia of BLE  With a foot wound and osteomyelitis. Patient to have arteriogram with possible intervention on Friday 7/17. Apixaban has been held and heparin is to be started to bridge patient through procedure. CBC stable  Will need to use APTT initially to monitor heparin due to effect of apixaban on heparin levels.   Goal of Therapy:  Heparin level 0.3-0.7 units/ml aPTT 66-102 seconds Monitor platelets by anticoagulation protocol: Yes   Plan:  Heparin drip at 1200 units/hr (no bolus) at 2200 (12 hours after last dose of apixaban) Heparin level and APTT in 6 hours  Daily heparin level and APTT Monitor for bleeding  Pricsilla Lindvall A. Levada Dy, PharmD, [place] Please utilize Amion for appropriate phone number to reach the unit pharmacist ([place])   03/18/2019,1:46 PM

## 2019-03-18 NOTE — Progress Notes (Addendum)
  Progress Note    03/18/2019 8:48 AM  Subjective:  No complaints this morning   Vitals:   03/18/19 0517 03/18/19 0755  BP: 138/87 (!) 140/103  Pulse: 85 76  Resp: 20 16  Temp: 98.2 F (36.8 C) 98.8 F (37.1 C)  SpO2: 100% 100%   Physical Exam: Lungs:  Non labored Extremities:  Wounds BLE covered; feet warm to touch Neurologic: baseline  CBC    Component Value Date/Time   WBC 7.3 03/18/2019 0352   RBC 4.10 (L) 03/18/2019 0352   HGB 10.8 (L) 03/18/2019 0352   HCT 33.0 (L) 03/18/2019 0352   PLT 279 03/18/2019 0352   MCV 80.5 03/18/2019 0352   MCH 26.3 03/18/2019 0352   MCHC 32.7 03/18/2019 0352   RDW 15.4 03/18/2019 0352   LYMPHSABS 3.0 03/14/2019 1348   MONOABS 0.5 03/14/2019 1348   EOSABS 0.1 03/14/2019 1348   BASOSABS 0.0 03/14/2019 1348    BMET    Component Value Date/Time   NA 136 03/18/2019 0352   K 4.4 03/18/2019 0352   CL 110 03/18/2019 0352   CO2 20 (L) 03/18/2019 0352   GLUCOSE 97 03/18/2019 0352   BUN 16 03/18/2019 0352   CREATININE 1.80 (H) 03/18/2019 0352   CALCIUM 8.7 (L) 03/18/2019 0352   GFRNONAA 38 (L) 03/18/2019 0352   GFRAA 44 (L) 03/18/2019 0352    INR No results found for: INR   Intake/Output Summary (Last 24 hours) at 03/18/2019 0848 Last data filed at 03/18/2019 0700 Gross per 24 hour  Intake 600 ml  Output 1100 ml  Net -500 ml     Assessment/Plan:  68 y.o. male with PAD, ulcerations both feet  Pressure ulcers both feet; prevalon boots ordered CKD: continue to hydrate to optimize renal function Agree with IV antibiotics for osteomyelitis Plan is for aortogram with possible RLE intervention on Friday with CO2 contrast Transition from Eliquis to heparin in preparation for procedure   Dagoberto Ligas, PA-C [facility] [phone] 03/18/2019 8:48 AM  I agree with the above.  I seen evaluate patient.  Plan is for arteriogram with possible right leg intervention on Friday.  Please stop the patient's  Eliquis and make him n.p.o. after midnight on Thursday night.  Annamarie Major

## 2019-03-18 NOTE — H&P (View-Only) (Signed)
  Progress Note    03/18/2019 8:48 AM  Subjective:  No complaints this morning   Vitals:   03/18/19 0517 03/18/19 0755  BP: 138/87 (!) 140/103  Pulse: 85 76  Resp: 20 16  Temp: 98.2 F (36.8 C) 98.8 F (37.1 C)  SpO2: 100% 100%   Physical Exam: Lungs:  Non labored Extremities:  Wounds BLE covered; feet warm to touch Neurologic: baseline  CBC    Component Value Date/Time   WBC 7.3 03/18/2019 0352   RBC 4.10 (L) 03/18/2019 0352   HGB 10.8 (L) 03/18/2019 0352   HCT 33.0 (L) 03/18/2019 0352   PLT 279 03/18/2019 0352   MCV 80.5 03/18/2019 0352   MCH 26.3 03/18/2019 0352   MCHC 32.7 03/18/2019 0352   RDW 15.4 03/18/2019 0352   LYMPHSABS 3.0 03/14/2019 1348   MONOABS 0.5 03/14/2019 1348   EOSABS 0.1 03/14/2019 1348   BASOSABS 0.0 03/14/2019 1348    BMET    Component Value Date/Time   NA 136 03/18/2019 0352   K 4.4 03/18/2019 0352   CL 110 03/18/2019 0352   CO2 20 (L) 03/18/2019 0352   GLUCOSE 97 03/18/2019 0352   BUN 16 03/18/2019 0352   CREATININE 1.80 (H) 03/18/2019 0352   CALCIUM 8.7 (L) 03/18/2019 0352   GFRNONAA 38 (L) 03/18/2019 0352   GFRAA 44 (L) 03/18/2019 0352    INR No results found for: INR   Intake/Output Summary (Last 24 hours) at 03/18/2019 0848 Last data filed at 03/18/2019 0700 Gross per 24 hour  Intake 600 ml  Output 1100 ml  Net -500 ml     Assessment/Plan:  68 y.o. male with PAD, ulcerations both feet  Pressure ulcers both feet; prevalon boots ordered CKD: continue to hydrate to optimize renal function Agree with IV antibiotics for osteomyelitis Plan is for aortogram with possible RLE intervention on Friday with CO2 contrast Transition from Eliquis to heparin in preparation for procedure   Dagoberto Ligas, PA-C [facility] [phone] 03/18/2019 8:48 AM  I agree with the above.  I seen evaluate patient.  Plan is for arteriogram with possible right leg intervention on Friday.  Please stop the patient's  Eliquis and make him n.p.o. after midnight on Thursday night.  Annamarie Major

## 2019-03-19 LAB — CBC
HCT: 34.2 % — ABNORMAL LOW (ref 39.0–52.0)
Hemoglobin: 10.8 g/dL — ABNORMAL LOW (ref 13.0–17.0)
MCH: 26.3 pg (ref 26.0–34.0)
MCHC: 31.6 g/dL (ref 30.0–36.0)
MCV: 83.2 fL (ref 80.0–100.0)
Platelets: 276 10*3/uL (ref 150–400)
RBC: 4.11 MIL/uL — ABNORMAL LOW (ref 4.22–5.81)
RDW: 15.8 % — ABNORMAL HIGH (ref 11.5–15.5)
WBC: 6.6 10*3/uL (ref 4.0–10.5)
nRBC: 0 % (ref 0.0–0.2)

## 2019-03-19 LAB — BASIC METABOLIC PANEL
Anion gap: 6 (ref 5–15)
BUN: 17 mg/dL (ref 8–23)
CO2: 22 mmol/L (ref 22–32)
Calcium: 8.8 mg/dL — ABNORMAL LOW (ref 8.9–10.3)
Chloride: 110 mmol/L (ref 98–111)
Creatinine, Ser: 1.88 mg/dL — ABNORMAL HIGH (ref 0.61–1.24)
GFR calc Af Amer: 42 mL/min — ABNORMAL LOW (ref >60)
GFR calc non Af Amer: 36 mL/min — ABNORMAL LOW (ref >60)
Glucose, Bld: 113 mg/dL — ABNORMAL HIGH (ref 70–99)
Potassium: 4.7 mmol/L (ref 3.5–5.1)
Sodium: 138 mmol/L (ref 135–145)

## 2019-03-19 LAB — GLUCOSE, CAPILLARY
Glucose-Capillary: 100 mg/dL — ABNORMAL HIGH (ref 70–99)
Glucose-Capillary: 90 mg/dL (ref 70–99)
Glucose-Capillary: 95 mg/dL (ref 70–99)
Glucose-Capillary: 93 mg/dL (ref 70–99)

## 2019-03-19 LAB — HEPARIN LEVEL (UNFRACTIONATED): Heparin Unfractionated: >2.2 IU/mL — ABNORMAL HIGH (ref 0.30–0.70)

## 2019-03-19 LAB — APTT
aPTT: 126 seconds — ABNORMAL HIGH (ref 24–36)
aPTT: >200 seconds (ref 24–36)

## 2019-03-19 LAB — MAGNESIUM: Magnesium: 1.6 mg/dL — ABNORMAL LOW (ref 1.7–2.4)

## 2019-03-19 MED ORDER — MAGNESIUM SULFATE 2 GM/50ML IV SOLN
2.0000 g | Freq: Once | INTRAVENOUS | Status: AC
  Administered 2019-03-19: 2 g via INTRAVENOUS
  Filled 2019-03-19: qty 50

## 2019-03-19 MED ORDER — HEPARIN (PORCINE) 25000 UT/250ML-% IV SOLN
850.0000 [IU]/h | INTRAVENOUS | Status: DC
  Filled 2019-03-19: qty 250

## 2019-03-19 NOTE — Care Management Important Message (Signed)
Important Message  Patient Details  Name: [name] MRN: [id number] Date of Birth: 01/09/1951   Medicare Important Message Given:  Yes     Memory Argue 03/19/2019, 3:39 PM

## 2019-03-19 NOTE — Plan of Care (Signed)

## 2019-03-19 NOTE — Progress Notes (Signed)
RN called and updated pt's niece, Kyron Schlitt, of pt status. Informed and blood consents obtained. All questions answered to satisfaction. Will continue to monitor.

## 2019-03-19 NOTE — Progress Notes (Signed)
[place] for Heparin Indication: atrial fibrillation while apixaban on hold  No Known Allergies  Patient Measurements: Height: 6\' 2"  (188 cm) Weight: 175 lb 4.3 oz (79.5 kg) IBW/kg (Calculated) : 82.2 Heparin Dosing Weight: 79.5 kg  Vital Signs: Temp: 98.7 F (37.1 C) (07/16 0356) Temp Source: Oral (07/16 0356) BP: 143/84 (07/16 0356) Pulse Rate: 68 (07/16 0356)  Labs: Recent Labs    03/17/19 0326 03/18/19 0352 03/19/19 0434  HGB 11.6* 10.8* 10.8*  HCT 36.5* 33.0* 34.2*  PLT 309 279 276  APTT  --   --  126*  HEPARINUNFRC  --   --  >2.20*  CREATININE 1.87* 1.80* 1.88*    Estimated Creatinine Clearance: 42.9 mL/min (A) (by C-G formula based on SCr of 1.88 mg/dL (H)).   Medical History: Past Medical History:  Diagnosis Date  . Alcoholism ([place])   . Coronary artery disease   . Dementia ([place])   . Hyperlipidemia   . Hypertension   . Paroxysmal atrial fibrillation (HCC)   . Stroke [facility])      Assessment: 68 yo male with Afib on apixaban PTA admitted with limb ischemia of BLE  With a foot wound and osteomyelitis. Patient to have arteriogram with possible intervention on Friday 7/17. Apixaban has been held and heparin is to be started to bridge patient through procedure. CBC stable  Will need to use APTT initially to monitor heparin due to effect of apixaban on heparin levels.   Initial aPTT is elevated this AM, no issues per RN  Goal of Therapy:  Heparin level 0.3-0.7 units/ml aPTT 66-102 seconds Monitor platelets by anticoagulation protocol: Yes   Plan:  Dec heparin to 1050 units/hr Re-check aPTT at 1400 Daily heparin level and APTT Monitor for bleeding  Narda Bonds, PharmD, BCPS Clinical Pharmacist Phone: [phone]

## 2019-03-19 NOTE — Progress Notes (Signed)
ANTICOAGULATION CONSULT NOTE - Initial Consult  Pharmacy Consult for Heparin Indication: atrial fibrillation while apixaban on hold  No Known Allergies  Patient Measurements: Height: 6\' 2"  (188 cm) Weight: 175 lb 4.3 oz (79.5 kg) IBW/kg (Calculated) : 82.2 Heparin Dosing Weight: 79.5 kg  Vital Signs: Temp: 98.8 F (37.1 C) (07/16 0901) Temp Source: Oral (07/16 0901) BP: 121/67 (07/16 0901) Pulse Rate: 75 (07/16 0901)  Labs: Recent Labs    03/17/19 0326 03/18/19 0352 03/19/19 0434 03/19/19 1500  HGB 11.6* 10.8* 10.8*  --   HCT 36.5* 33.0* 34.2*  --   PLT 309 279 276  --   APTT  --   --  126* >200*  HEPARINUNFRC  --   --  >2.20*  --   CREATININE 1.87* 1.80* 1.88*  --     Estimated Creatinine Clearance: 42.9 mL/min (A) (by C-G formula based on SCr of 1.88 mg/dL (H)).   Medical History: Past Medical History:  Diagnosis Date  . Alcoholism ([place])   . Coronary artery disease   . Dementia ([place])   . Hyperlipidemia   . Hypertension   . Paroxysmal atrial fibrillation (HCC)   . Stroke [facility])      Assessment: 68 yo male with Afib on apixaban PTA admitted with limb ischemia of BLE  With a foot wound and osteomyelitis. Patient to have arteriogram with possible intervention on Friday 7/17. Apixaban has been held and heparin is to be started to bridge patient through procedure. CBC stable  Will need to use APTT initially to monitor heparin due to effect of apixaban on heparin levels.   APTT >200 sec. Unknown if drawn too closely to heparin drip. Will hold drip and decrease rate empirically.   Goal of Therapy:  Heparin level 0.3-0.7 units/ml aPTT 66-102 seconds Monitor platelets by anticoagulation protocol: Yes   Plan:  Hold heparin for 1 hour Restart heparin drip at 900 units/hr at 1700  APTT in 8 hours  Daily heparin level and APTT Monitor for bleeding  Arthurine Oleary A. Levada Dy, PharmD, [place] Please utilize Amion for appropriate phone  number to reach the unit pharmacist ([place])   03/19/2019,4:07 PM

## 2019-03-19 NOTE — Progress Notes (Signed)
PROGRESS NOTE    [name]  JJO:[id number] DOB: [birth year] DOA: 03/14/2019 PCP: [name], MD   Brief Narrative:  Patient is a 68 year old with history of moderate dementia with occasional behavioral disturbances, CAD status post MI, paroxysmal A. fib on Eliquis, CKD stage IV, essential hypertension, hyperlipidemia, history of substance use and alcohol use brought to the hospital for evaluation of right lower extremity foot wound.  Currently this has been ongoing for the past almost 2 weeks, diagnosed with bony irregularities concerning for osteomyelitis.  His ABIs showed severe peripheral vascular disease therefore vascular surgery consulted.  Hospital course also complicated by intermittent hypoglycemia secondary to starting antidiabetic medication. Vascular surgery planning for arteriogram on Friday.  I have also requested orthopedics consultation with Dr. Sharol Given for possible amputation if required.He will see him on Saturday.  Assessment & Plan:   Principal Problem:   Osteomyelitis of right foot ([place]) Active Problems:   Dementia ([place])   Wound of right foot   Coronary artery disease   Hyperlipidemia   Hypertension   Paroxysmal atrial fibrillation (HCC)   CKD (chronic kidney disease), stage IV (HCC)   Right foot ulceration: Patient presented with dry gangrenous changes, ulcer of the right lateral foot and  heel.  MRI showed marrow edema in the lateral periphery of the fifth metatarsal subjacent to a skin wound is consistent with osteomyelitis.  Currently on cefazolin.  Patient is not septic. Continue wound care.  Hemoglobin A1c of 6.4.  Lipid panel showed LDL of 79  Severe peripheral vascular disease: As per lower extremity ABI.  Vascular surgery doing arteriogram with possible intervention on Friday.  Paroxysmal A. fib: On Eliquis at home.  Eliquis held for procedure on Friday.  Started on heparin.  On amiodarone also.  Lopressor held due to bradycardia  AKI on CKD stage  IV: Kidney function improved with IV fluids.  Currently kidney function is at baseline.  Hypertension: Currently antihypertensives are on hold.  Blood pressure stable.  Hyperlipidemia: Continue statin  Diabetes type 2/hypoglycemia: HbA1C of 6.4.  Continue sliding scale insulin for now.  He was started on  glipizide .  Hypoglycemia suspected due to glipizide.Now stopped.  Moderate vascular dementia with behavioral changes: Continue supportive care.  Patient is intermittently confused.  He says he lives with his niece.              DVT prophylaxis: Heparin Code Status: Full Family Communication: None Disposition Plan: Likely home after vascular/orthopedic intervention   Consultants: Vascular surgery, orthopedics  Procedures: None  Antimicrobials:  Anti-infectives (From admission, onward)   Start     Dose/Rate Route Frequency Ordered Stop   03/17/19 1400  ceFAZolin (ANCEF) IVPB 2g/100 mL premix     2 g 200 mL/hr over 30 Minutes Intravenous Every 8 hours 03/17/19 0810     03/16/19 1400  ceFAZolin (ANCEF) IVPB 1 g/50 mL premix  Status:  Discontinued     1 g 100 mL/hr over 30 Minutes Intravenous Every 8 hours 03/16/19 1043 03/17/19 0810   03/15/19 2000  vancomycin (VANCOCIN) 1,250 mg in sodium chloride 0.9 % 250 mL IVPB  Status:  Discontinued     1,250 mg 166.7 mL/hr over 90 Minutes Intravenous Every 24 hours 03/14/19 1844 03/16/19 1036   03/14/19 2300  cefTRIAXone (ROCEPHIN) 2 g in sodium chloride 0.9 % 100 mL IVPB  Status:  Discontinued     2 g 200 mL/hr over 30 Minutes Intravenous Every 24 hours 03/14/19 1837 03/16/19 1036   03/14/19  1930  vancomycin (VANCOCIN) 2,000 mg in sodium chloride 0.9 % 500 mL IVPB     2,000 mg 250 mL/hr over 120 Minutes Intravenous  Once 03/14/19 1844 03/14/19 2159   03/14/19 1700  piperacillin-tazobactam (ZOSYN) IVPB 2.25 g     2.25 g 100 mL/hr over 30 Minutes Intravenous  Once 03/14/19 1614 03/14/19 1800      Subjective:  Patient seen  and examined the bedside this morning.  Currently looks comfortable, hemodynamically stable.  Eating his breakfast.  Denies any new complaints.  Objective: Vitals:   03/18/19 1423 03/18/19 1949 03/19/19 0356 03/19/19 0901  BP: 136/84 122/77 (!) 143/84 121/67  Pulse: 80 72 68 75  Resp: 18 18 18 18   Temp: 98.6 F (37 C) 98.7 F (37.1 C) 98.7 F (37.1 C) 98.8 F (37.1 C)  TempSrc: Oral Oral Oral Oral  SpO2: 100% 99% 99% 100%  Weight:      Height:        Intake/Output Summary (Last 24 hours) at 03/19/2019 1408 Last data filed at 03/19/2019 1010 Gross per 24 hour  Intake 1401.57 ml  Output 3000 ml  Net -1598.43 ml   Filed Weights   03/14/19 2040  Weight: 79.5 kg    Examination: General exam: Appears calm and comfortable ,Not in distress,average built HEENT:PERRL,Oral mucosa moist, Ear/Nose normal on gross exam Respiratory system: Bilateral equal air entry, normal vesicular breath sounds, no wheezes or crackles  Cardiovascular system: S1 & S2 heard, RRR. No JVD, murmurs, rubs, gallops or clicks. Gastrointestinal system: Abdomen is nondistended, soft and nontender. No organomegaly or masses felt. Normal bowel sounds heard. Central nervous system: Alert and oriented. No focal neurological deficits. Extremities: Poor peripheral pulses. Skin:Ulcer on the lateral right foot and right heel    Data Reviewed: I have personally reviewed following labs and imaging studies  CBC: Recent Labs  Lab 03/14/19 1348 03/15/19 0531 03/16/19 0531 03/17/19 0326 03/18/19 0352 03/19/19 0434  WBC 7.6 6.0 6.9 7.5 7.3 6.6  NEUTROABS 4.0  --   --   --   --   --   HGB 11.6* 11.7* 11.2* 11.6* 10.8* 10.8*  HCT 37.7* 36.3* 34.8* 36.5* 33.0* 34.2*  MCV 84.0 81.8 81.3 82.4 80.5 83.2  PLT 345 323 290 309 279 841   Basic Metabolic Panel: Recent Labs  Lab 03/15/19 0531 03/16/19 0531 03/17/19 0326 03/18/19 0352 03/19/19 0434  NA 139 137 138 136 138  K 4.6 4.4 4.4 4.4 4.7  CL 112* 110 112*  110 110  CO2 21* 20* 17* 20* 22  GLUCOSE 92 96 63* 97 113*  BUN 40* 30* 22 16 17   CREATININE 2.61* 2.32* 1.87* 1.80* 1.88*  CALCIUM 8.9 8.4* 8.8* 8.7* 8.8*  MG  --  2.1 1.8 1.7 1.6*   GFR: Estimated Creatinine Clearance: 42.9 mL/min (A) (by C-G formula based on SCr of 1.88 mg/dL (H)). Liver Function Tests: Recent Labs  Lab 03/14/19 1348  AST 43*  ALT 40  ALKPHOS 60  BILITOT 0.7  PROT 7.2  ALBUMIN 2.7*   No results for input(s): LIPASE, AMYLASE in the last 168 hours. No results for input(s): AMMONIA in the last 168 hours. Coagulation Profile: No results for input(s): INR, PROTIME in the last 168 hours. Cardiac Enzymes: No results for input(s): CKTOTAL, CKMB, CKMBINDEX, TROPONINI in the last 168 hours. BNP (last 3 results) No results for input(s): PROBNP in the last 8760 hours. HbA1C: No results for input(s): HGBA1C in the last 72 hours. CBG: Recent  Labs  Lab 03/18/19 1217 03/18/19 1600 03/18/19 2006 03/19/19 0821 03/19/19 1229  GLUCAP 100* 97 147* 100* 90   Lipid Profile: No results for input(s): CHOL, HDL, LDLCALC, TRIG, CHOLHDL, LDLDIRECT in the last 72 hours. Thyroid Function Tests: No results for input(s): TSH, T4TOTAL, FREET4, T3FREE, THYROIDAB in the last 72 hours. Anemia Panel: No results for input(s): VITAMINB12, FOLATE, FERRITIN, TIBC, IRON, RETICCTPCT in the last 72 hours. Sepsis Labs: Recent Labs  Lab 03/14/19 1348 03/14/19 1731  LATICACIDVEN 1.2 1.1    Recent Results (from the past 240 hour(s))  SARS Coronavirus 2 (CEPHEID - Performed in [facility] hospital lab), Hosp Order     Status: None   Collection Time: 03/14/19  5:31 PM   Specimen: Nasopharyngeal Swab  Result Value Ref Range Status   SARS Coronavirus 2 NEGATIVE NEGATIVE Final    Comment: (NOTE) If result is NEGATIVE SARS-CoV-2 target nucleic acids are NOT DETECTED. The SARS-CoV-2 RNA is generally detectable in upper and lower  respiratory specimens during the acute phase of  infection. The lowest  concentration of SARS-CoV-2 viral copies this assay can detect is 250  copies / mL. A negative result does not preclude SARS-CoV-2 infection  and should not be used as the sole basis for treatment or other  patient management decisions.  A negative result may occur with  improper specimen collection / handling, submission of specimen other  than nasopharyngeal swab, presence of viral mutation(s) within the  areas targeted by this assay, and inadequate number of viral copies  (<250 copies / mL). A negative result must be combined with clinical  observations, patient history, and epidemiological information. If result is POSITIVE SARS-CoV-2 target nucleic acids are DETECTED. The SARS-CoV-2 RNA is generally detectable in upper and lower  respiratory specimens dur ing the acute phase of infection.  Positive  results are indicative of active infection with SARS-CoV-2.  Clinical  correlation with patient history and other diagnostic information is  necessary to determine patient infection status.  Positive results do  not rule out bacterial infection or co-infection with other viruses. If result is PRESUMPTIVE POSTIVE SARS-CoV-2 nucleic acids MAY BE PRESENT.   A presumptive positive result was obtained on the submitted specimen  and confirmed on repeat testing.  While 2019 novel coronavirus  (SARS-CoV-2) nucleic acids may be present in the submitted sample  additional confirmatory testing may be necessary for epidemiological  and / or clinical management purposes  to differentiate between  SARS-CoV-2 and other Sarbecovirus currently known to infect humans.  If clinically indicated additional testing with an alternate test  methodology [phone]) is advised. The SARS-CoV-2 RNA is generally  detectable in upper and lower respiratory sp ecimens during the acute  phase of infection. The expected result is Negative. Fact Sheet for Patients:   StrictlyIdeas.no Fact Sheet for Healthcare Providers: BankingDealers.co.za This test is not yet approved or cleared by the [place] FDA and has been authorized for detection and/or diagnosis of SARS-CoV-2 by FDA under an Emergency Use Authorization (EUA).  This EUA will remain in effect (meaning this test can be used) for the duration of the COVID-19 declaration under Section 564(b)(1) of the Act, 21 U.S.C. section 360bbb-3(b)(1), unless the authorization is terminated or revoked sooner. Performed at [facility], [place] [address], [place], [place] [address]   Wound or Superficial Culture     Status: None   Collection Time: 03/14/19  6:01 PM   Specimen: Ulcer; Wound  Result Value Ref Range Status  Specimen Description ULCER  Final   Special Requests NONE  Final   Gram Stain NO WBC SEEN RARE GRAM POSITIVE COCCI   Final   Culture   Final    NORMAL SKIN FLORA Performed at [facility], [place] [address] [place], [place] [address]    Report Status 03/17/2019 FINAL  Final         Radiology Studies: No results found.      Scheduled Meds: . amiodarone  200 mg Oral BID  . aspirin EC  81 mg Oral Daily  . atorvastatin  40 mg Oral Daily  . insulin aspart  0-5 Units Subcutaneous QHS  . insulin aspart  0-9 Units Subcutaneous TID WC  . sodium chloride flush  10-40 mL Intracatheter Q12H   Continuous Infusions: .  ceFAZolin (ANCEF) IV 2 g (03/19/19 1341)  . heparin 1,050 Units/hr (03/19/19 0600)     LOS: 5 days    Time spent: 35 mins.More than 50% of that time was spent in counseling and/or coordination of care.      Shelly Coss, MD [facility] Pager [phone]  If 7PM-7AM, please contact night-coverage [email or web address] Password [facility] 03/19/2019, 2:08 PM

## 2019-03-19 NOTE — Progress Notes (Signed)
Pharmacy Antibiotic Note  [name] is a 68 y.o. male admitted on 03/14/2019 with foot ulcers on R foot. Starting broad spectrum antibiotics for possible osteomyelitis. SCr 1.88, normalized CrCl ~43 ml/min. Patient received one dose of Zosyn in ED. Vanc and Rocephin D/C'd by MD on 7/13. Pharmacy consulted to dose cefazolin.  Scr elevated but trending down 2.94>>2.32>>1.98. WBC wnl. Afebrile.  Plan: -Continue cefazolin 2 gm IV q8h -Monitor renal fx, cultures, VT at steady state   Temp (24hrs), Avg:98.7 F (37.1 C), Min:98.7 F (37.1 C), Max:98.8 F (37.1 C)  Recent Labs  Lab 03/14/19 1348 03/14/19 1731 03/15/19 0531 03/16/19 0531 03/17/19 0326 03/18/19 0352 03/19/19 0434  WBC 7.6  --  6.0 6.9 7.5 7.3 6.6  CREATININE 2.94*  --  2.61* 2.32* 1.87* 1.80* 1.88*  LATICACIDVEN 1.2 1.1  --   --   --   --   --      Antimicrobials this admission: Cefazolin 7/13>> 7/11 vancomycin >7/13 7/11 ceftriaxone >7/13 7/11 zosyn x1  Dose adjustments this admission: N/A  Microbiology results: 7/11 Covid-19: neg 7/11 wound cx: normal skin flora   Parish Dubose A. Levada Dy, PharmD, [place] Please utilize Amion for appropriate phone number to reach the unit pharmacist ([place])   03/19/2019 4:14 PM

## 2019-03-19 NOTE — Progress Notes (Signed)
Rehab Admissions Coordinator Note:  Per PT recommendation, this patient was screened by Jhonnie Garner for appropriateness for an Inpatient Acute Rehab Consult.  Noted plans for arteriogram Friday and orthopedic consultation Saturday for possible intervention. AC will follow along at this time and await medical workup.    Jhonnie Garner 03/19/2019, 11:02 AM  I can be reached at [phone].

## 2019-03-19 NOTE — Progress Notes (Signed)
Physical Therapy Treatment Patient Details Name: [name] MRN: [id number] DOB: 1950/10/11 Today's Date: 03/19/2019    History of Present Illness Pt is a 68 y/o male admitted 03/14/19 secondary to worsening RLE wound. 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 Plan is for arteriogram with possible right leg intervention on 7/17. PMH includes dementia, CAD, MI, HTN, a fib, CKD.   PT Comments    Pt progressing with mobility. Denies foot pain this session. Able to stand and initiate gait training with RW and modA. Pt limited by generalized weakness, decreased sensation and very poor balance strategies/balance reactions; at high risk for falls. Feel pt would benefit from intensive CIR-level therapies to maximize functional mobility and independence prior to return home. Per niece, pt ambulatory with RW requiring assist from her for ADLs; niece does not want him to d/c to SNF.     Follow Up Recommendations  CIR;Supervision/Assistance - 24 hour     Equipment Recommendations  Rolling walker with 5" wheels    Recommendations for Other Services       Precautions / Restrictions Precautions Precautions: Fall    Mobility  Bed Mobility Overal bed mobility: Needs Assistance Bed Mobility: Supine to Sit     Supine to sit: Supervision     General bed mobility comments: Significant increased time and effort, easily distracted requiring frequent cues to stay on task for coming to sit EOB. No physical assist with bed flat  Transfers Overall transfer level: Needs assistance Equipment used: Rolling walker (2 wheeled) Transfers: Sit to/from Stand Sit to Stand: Mod assist         General transfer comment: Heavy modA to assist trunk elevation standing from EOB; multimodal cues for technique/hand placement. Min-modA to stand from recliner as pt able to use arm rests; heavy reliance on BUE support  Ambulation/Gait Ambulation/Gait assistance: Min assist Gait Distance (Feet): 3 Feet   Gait  Pattern/deviations: Step-to pattern;Leaning posteriorly;Trunk flexed Gait velocity: Decreased   General Gait Details: Slow, unsteady gait from bed to recliner with RW; frequent cues for sequencing and upright posture, intermittent minA for balance due to posterior lean and attempting to sit prematurely   Stairs             Wheelchair Mobility    Modified Rankin (Stroke Patients Only)       Balance Overall balance assessment: Needs assistance Sitting-balance support: No upper extremity supported;Feet supported Sitting balance-Leahy Scale: Fair     Standing balance support: Bilateral upper extremity supported Standing balance-Leahy Scale: Poor Standing balance comment: Heavy reliance on UE support; dependent for pericare while standing                            Cognition Arousal/Alertness: Awake/alert Behavior During Therapy: WFL for tasks assessed/performed Overall Cognitive Status: History of cognitive impairments - at baseline Area of Impairment: Orientation;Attention;Following commands;Awareness;Problem solving;Safety/judgement                 Orientation Level: Disoriented to;Place;Time;Situation Current Attention Level: Sustained   Following Commands: Follows one step commands with increased time Safety/Judgement: Decreased awareness of deficits;Decreased awareness of safety Awareness: Intellectual Problem Solving: Slow processing;Requires verbal cues;Decreased initiation General Comments: Able to state [facility], got to hospital with cues and knew it must be [name]. Seems pleasantly confused.      Exercises      General Comments        Pertinent Vitals/Pain Pain Assessment: No/denies pain    Home Living  Prior Function            PT Goals (current goals can now be found in the care plan section) Acute Rehab PT Goals Patient Stated Goal: to decrease pain  PT Goal Formulation: With patient Time  For Goal Achievement: 03/30/19 Potential to Achieve Goals: Good Progress towards PT goals: Progressing toward goals    Frequency    Min 3X/week      PT Plan Discharge plan needs to be updated    Co-evaluation              AM-PAC PT "6 Clicks" Mobility   Outcome Measure  Help needed turning from your back to your side while in a flat bed without using bedrails?: A Little Help needed moving from lying on your back to sitting on the side of a flat bed without using bedrails?: A Little Help needed moving to and from a bed to a chair (including a wheelchair)?: A Lot Help needed standing up from a chair using your arms (e.g., wheelchair or bedside chair)?: A Lot Help needed to walk in hospital room?: A Lot Help needed climbing 3-5 steps with a railing? : Total 6 Click Score: 13    End of Session Equipment Utilized During Treatment: Gait belt Activity Tolerance: Patient tolerated treatment well Patient left: in chair;with call bell/phone within reach;with chair alarm set Nurse Communication: Mobility status PT Visit Diagnosis: Difficulty in walking, not elsewhere classified (R26.2);Unsteadiness on feet (R26.81);Muscle weakness (generalized) (M62.81);Pain Pain - Right/Left: Right Pain - part of body: Ankle and joints of foot     Time: 5027-7412 PT Time Calculation (min) (ACUTE ONLY): 27 min  Charges:  $Gait Training: 8-22 mins $Therapeutic Activity: 8-22 mins                    Mabeline Caras, PT, DPT Acute Rehabilitation Services  Pager [phone] Office [place] 03/19/2019, 10:27 AM

## 2019-03-19 NOTE — Progress Notes (Signed)
CRITICAL VALUE ALERT  Critical Value:  PTT >200  Date & Time Notied:  03/19/2019 1604  Provider Notified: Shelly Coss, MD paged  Orders Received/Actions taken: Per Dwayne in pharmacy, heparin to be held for one hour. New orders to follow. Will continue to monitor.

## 2019-03-19 NOTE — Progress Notes (Signed)
Occupational Therapy Treatment Patient Details Name: [name] MRN: [id number] DOB: Sep 24, 1950 Today's Date: 03/19/2019    History of present illness Pt is a 68 y/o male admitted 03/14/19 secondary to worsening RLE wound. 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 Plan is for arteriogram with possible right leg intervention on 7/17. PMH includes dementia, CAD, MI, HTN, a fib, CKD.   OT comments  Pt slowly progressing toward OT goals, however, concerns of safety awareness due to receiving pt attempting to walk with no assistance. Pt required BSC to be placed at bed side and Max A/ 2 hand held assist required due to decreased strength to power up to stand from EOB. Stand pivot required during transfer but pt demonstrates difficulty to pivot on R foot. DC and freq remains the same. OT will continue to follow acutely.   Follow Up Recommendations  SNF    Equipment Recommendations  3 in 1 bedside commode    Recommendations for Other Services PT consult    Precautions / Restrictions Precautions Precautions: Fall Required Braces or Orthoses: Other Brace Other Brace: Prevalon boot Restrictions Weight Bearing Restrictions: No       Mobility Bed Mobility Overal bed mobility: Needs Assistance             General bed mobility comments: Pt received sitting at EOB upon arrival.   Transfers Overall transfer level: Needs assistance Equipment used: Rolling walker (2 wheeled) Transfers: Stand Pivot Transfers   Stand pivot transfers: Max assist       General transfer comment: Heavy Max A to power up to standing from EOB; multimodal cues for technique and hand placement for [facility] transfer.    Balance Overall balance assessment: Needs assistance Sitting-balance support: No upper extremity supported;Feet supported Sitting balance-Leahy Scale: Fair     Standing balance support: Bilateral upper extremity supported Standing balance-Leahy Scale: Poor Standing balance comment: Heavy reliance  on UE support                           ADL either performed or assessed with clinical judgement   ADL Overall ADL's : Needs assistance/impaired                         Toilet Transfer: Maximal assistance;Stand-pivot;Cueing for safety;Cueing for sequencing Toilet Transfer Details (indicate cue type and reason): Transfer to bsc. VCs for hand placement and sequencing. Decreased safety awareness. Session started due to pt's bed alarm, pt received attempting to stand to go to [facility] in restroom. BSC provided at bed side.  2 hand held assist required due to generalized weakness.           General ADL Comments: did not attempt transfer this date as pt was unable to bear weight through Lfoot and unable to weight shift while standing     Vision       Perception     Praxis      Cognition Arousal/Alertness: Awake/alert Behavior During Therapy: WFL for tasks assessed/performed Overall Cognitive Status: History of cognitive impairments - at baseline Area of Impairment: Orientation;Attention;Following commands;Awareness;Problem solving;Safety/judgement                 Orientation Level: Disoriented to;Place;Time;Situation Current Attention Level: Sustained   Following Commands: Follows one step commands with increased time Safety/Judgement: Decreased awareness of deficits;Decreased awareness of safety Awareness: Intellectual Problem Solving: Slow processing;Requires verbal cues;Decreased initiation General Comments: Able to state [place], got to hospital with cues  and knew it must be [name]. Seems pleasantly confused.        Exercises     Shoulder Instructions       General Comments safety awareness is a concern.     Pertinent Vitals/ Pain       Pain Assessment: 0-10 Pain Score: 10-Worst pain ever Pain Location: R foot  Pain Descriptors / Indicators: Discomfort;Guarding;Grimacing Pain Intervention(s): Limited activity within patient's  tolerance;Repositioned;Monitored during session  Home Living                                          Prior Functioning/Environment              Frequency  Min 2X/week        Progress Toward Goals  OT Goals(current goals can now be found in the care plan section)  Progress towards OT goals: Progressing toward goals  Acute Rehab OT Goals Patient Stated Goal: to decrease pain  OT Goal Formulation: With patient Time For Goal Achievement: 03/30/19 Potential to Achieve Goals: Good ADL Goals Pt Will Perform Grooming: with supervision;standing Pt Will Perform Upper Body Dressing: with supervision Pt Will Perform Lower Body Dressing: with min assist Pt Will Transfer to Toilet: ambulating;with min guard assist  Plan Discharge plan remains appropriate;Frequency remains appropriate    Co-evaluation                 AM-PAC OT "6 Clicks" Daily Activity     Outcome Measure   Help from another person eating meals?: None Help from another person taking care of personal grooming?: A Little Help from another person toileting, which includes using toliet, bedpan, or urinal?: A Lot Help from another person bathing (including washing, rinsing, drying)?: A Little Help from another person to put on and taking off regular upper body clothing?: A Little Help from another person to put on and taking off regular lower body clothing?: A Lot 6 Click Score: 17    End of Session Equipment Utilized During Treatment: Gait belt;Rolling walker  OT Visit Diagnosis: Unsteadiness on feet (R26.81);Other abnormalities of gait and mobility (R26.89);Repeated falls (R29.6);Muscle weakness (generalized) (M62.81);History of falling (Z91.81);Other symptoms and signs involving cognitive function;Pain Pain - Right/Left: Left Pain - part of body: Ankle and joints of foot   Activity Tolerance Patient tolerated treatment well;Patient limited by pain   Patient Left in bed;with call  bell/phone within reach;with bed alarm set   Nurse Communication Mobility status        Time: 5638-7564 OT Time Calculation (min): 21 min  Charges: OT General Charges $OT Visit: 1 Visit OT Treatments $Self Care/Home Management : 8-22 mins  Minus Breeding, MSOT, OTR/L  Supplemental Rehabilitation Services  [phone]    Marius Ditch 03/19/2019, 3:10 PM

## 2019-03-20 ENCOUNTER — Encounter (HOSPITAL_COMMUNITY)
Admission: EM | Disposition: A | Discharge status: Home Health | Payer: Self-pay | Source: Home / Self Care | Attending: Internal Medicine

## 2019-03-20 ENCOUNTER — Encounter (HOSPITAL_COMMUNITY): Payer: Self-pay | Admitting: Surgery

## 2019-03-20 DIAGNOSIS — L8990 Pressure ulcer of unspecified site, unspecified stage: Secondary | ICD-10-CM | POA: Insufficient documentation

## 2019-03-20 HISTORY — PX: ABDOMINAL AORTOGRAM W/LOWER EXTREMITY: CATH118223

## 2019-03-20 LAB — GLUCOSE, CAPILLARY
Glucose-Capillary: 75 mg/dL (ref 70–99)
Glucose-Capillary: 84 mg/dL (ref 70–99)
Glucose-Capillary: 132 mg/dL — ABNORMAL HIGH (ref 70–99)
Glucose-Capillary: 84 mg/dL (ref 70–99)

## 2019-03-20 LAB — CBC
HCT: 33.1 % — ABNORMAL LOW (ref 39.0–52.0)
Hemoglobin: 10.4 g/dL — ABNORMAL LOW (ref 13.0–17.0)
MCH: 26.1 pg (ref 26.0–34.0)
MCHC: 31.4 g/dL (ref 30.0–36.0)
MCV: 83 fL (ref 80.0–100.0)
Platelets: 276 10*3/uL (ref 150–400)
RBC: 3.99 MIL/uL — ABNORMAL LOW (ref 4.22–5.81)
RDW: 15.7 % — ABNORMAL HIGH (ref 11.5–15.5)
WBC: 6.9 10*3/uL (ref 4.0–10.5)
nRBC: 0 % (ref 0.0–0.2)

## 2019-03-20 LAB — APTT
aPTT: 103 seconds — ABNORMAL HIGH (ref 24–36)
aPTT: 112 seconds — ABNORMAL HIGH (ref 24–36)

## 2019-03-20 LAB — BASIC METABOLIC PANEL
Anion gap: 3 — ABNORMAL LOW (ref 5–15)
BUN: 13 mg/dL (ref 8–23)
CO2: 23 mmol/L (ref 22–32)
Calcium: 8.7 mg/dL — ABNORMAL LOW (ref 8.9–10.3)
Chloride: 112 mmol/L — ABNORMAL HIGH (ref 98–111)
Creatinine, Ser: 1.82 mg/dL — ABNORMAL HIGH (ref 0.61–1.24)
GFR calc Af Amer: 44 mL/min — ABNORMAL LOW (ref >60)
GFR calc non Af Amer: 38 mL/min — ABNORMAL LOW (ref >60)
Glucose, Bld: 91 mg/dL (ref 70–99)
Potassium: 4.9 mmol/L (ref 3.5–5.1)
Sodium: 138 mmol/L (ref 135–145)

## 2019-03-20 LAB — MAGNESIUM: Magnesium: 2 mg/dL (ref 1.7–2.4)

## 2019-03-20 LAB — HEPARIN LEVEL (UNFRACTIONATED): Heparin Unfractionated: 1.2 IU/mL — ABNORMAL HIGH (ref 0.30–0.70)

## 2019-03-20 SURGERY — ABDOMINAL AORTOGRAM W/LOWER EXTREMITY
Anesthesia: LOCAL | Laterality: Bilateral

## 2019-03-20 MED ORDER — HEPARIN (PORCINE) 25000 UT/250ML-% IV SOLN: 800.0000 [IU]/h | INTRAVENOUS | Status: DC

## 2019-03-20 MED ORDER — HEPARIN (PORCINE) IN NACL 1000-0.9 UT/500ML-% IV SOLN
INTRAVENOUS | Status: DC | PRN
  Administered 2019-03-20 (×2): 500 mL

## 2019-03-20 MED ORDER — APIXABAN 5 MG PO TABS
5.0000 mg | ORAL_TABLET | Freq: Two times a day (BID) | ORAL | Status: DC
  Administered 2019-03-20 – 2019-03-23 (×7): 5 mg via ORAL
  Filled 2019-03-20 (×7): qty 1

## 2019-03-20 MED ORDER — IODIXANOL 320 MG/ML IV SOLN
INTRAVENOUS | Status: DC | PRN
  Administered 2019-03-20: 70 mL via INTRA_ARTERIAL

## 2019-03-20 MED ORDER — ONDANSETRON HCL 4 MG/2ML IJ SOLN: 4.0000 mg | Freq: Four times a day (QID) | INTRAMUSCULAR | Status: DC | PRN

## 2019-03-20 MED ORDER — SODIUM CHLORIDE 0.9 % IV SOLN: 250.0000 mL | INTRAVENOUS | Status: DC | PRN

## 2019-03-20 MED ORDER — SODIUM CHLORIDE 0.9 % IV SOLN
INTRAVENOUS | Status: DC
  Administered 2019-03-20 – 2019-03-21 (×3): via INTRAVENOUS

## 2019-03-20 MED ORDER — MIDAZOLAM HCL 2 MG/2ML IJ SOLN
INTRAMUSCULAR | Status: AC
  Filled 2019-03-20: qty 2

## 2019-03-20 MED ORDER — ACETAMINOPHEN 325 MG PO TABS
650.0000 mg | ORAL_TABLET | ORAL | Status: DC | PRN
  Administered 2019-03-21: 650 mg via ORAL

## 2019-03-20 MED ORDER — SODIUM CHLORIDE 0.9 % WEIGHT BASED INFUSION: 1.0000 mL/kg/h | INTRAVENOUS | Status: AC

## 2019-03-20 MED ORDER — MIDAZOLAM HCL 2 MG/2ML IJ SOLN
INTRAMUSCULAR | Status: DC | PRN
  Administered 2019-03-20: 1 mg via INTRAVENOUS

## 2019-03-20 MED ORDER — LIDOCAINE HCL (PF) 1 % IJ SOLN
INTRAMUSCULAR | Status: DC | PRN
  Administered 2019-03-20: 20 mL via INTRADERMAL

## 2019-03-20 MED ORDER — FENTANYL CITRATE (PF) 100 MCG/2ML IJ SOLN
INTRAMUSCULAR | Status: DC | PRN
  Administered 2019-03-20: 50 ug via INTRAVENOUS

## 2019-03-20 MED ORDER — FENTANYL CITRATE (PF) 100 MCG/2ML IJ SOLN
INTRAMUSCULAR | Status: AC
  Filled 2019-03-20: qty 2

## 2019-03-20 MED ORDER — LIDOCAINE HCL (PF) 1 % IJ SOLN
INTRAMUSCULAR | Status: AC
  Filled 2019-03-20: qty 30

## 2019-03-20 MED ORDER — LABETALOL HCL 5 MG/ML IV SOLN: 10.0000 mg | INTRAVENOUS | Status: DC | PRN

## 2019-03-20 MED ORDER — HYDRALAZINE HCL 20 MG/ML IJ SOLN: 5.0000 mg | INTRAMUSCULAR | Status: DC | PRN

## 2019-03-20 MED ORDER — SODIUM CHLORIDE 0.9% FLUSH: 3.0000 mL | INTRAVENOUS | Status: DC | PRN

## 2019-03-20 MED ORDER — SODIUM CHLORIDE 0.9% FLUSH
3.0000 mL | Freq: Two times a day (BID) | INTRAVENOUS | Status: DC
  Administered 2019-03-22 – 2019-03-23 (×2): 3 mL via INTRAVENOUS

## 2019-03-20 MED ORDER — DOXYCYCLINE HYCLATE 100 MG PO TABS
100.0000 mg | ORAL_TABLET | Freq: Two times a day (BID) | ORAL | Status: DC
  Administered 2019-03-20 – 2019-03-23 (×7): 100 mg via ORAL
  Filled 2019-03-20 (×8): qty 1

## 2019-03-20 MED ORDER — HEPARIN (PORCINE) IN NACL 1000-0.9 UT/500ML-% IV SOLN
INTRAVENOUS | Status: AC
  Filled 2019-03-20: qty 1000

## 2019-03-20 SURGICAL SUPPLY — 17 items
CATH OMNI FLUSH 5F 65CM (CATHETERS) ×1 IMPLANT
CATH SOFT-VU 4F 65 STRAIGHT (CATHETERS) IMPLANT
CATH SOFT-VU STRAIGHT 4F 65CM (CATHETERS) ×2
CLOSURE MYNX CONTROL 5F (Vascular Products) ×1 IMPLANT
FILTER CO2 0.2 MICRON (VASCULAR PRODUCTS) ×2 IMPLANT
KIT MICROPUNCTURE NIT STIFF (SHEATH) ×1 IMPLANT
KIT PV (KITS) ×2 IMPLANT
RESERVOIR CO2 (VASCULAR PRODUCTS) ×2 IMPLANT
SET FLUSH CO2 (MISCELLANEOUS) ×1 IMPLANT
SHEATH PINNACLE 5F 10CM (SHEATH) ×1 IMPLANT
SHEATH PROBE COVER 6X72 (BAG) ×1 IMPLANT
STOPCOCK MORSE 400PSI 3WAY (MISCELLANEOUS) ×1 IMPLANT
SYR MEDRAD MARK 7 150ML (SYRINGE) ×2 IMPLANT
TRANSDUCER W/STOPCOCK (MISCELLANEOUS) ×2 IMPLANT
TRAY PV CATH (CUSTOM PROCEDURE TRAY) ×2 IMPLANT
TUBING CIL FLEX 10 FLL-RA (TUBING) ×1 IMPLANT
WIRE BENTSON .035X145CM (WIRE) ×1 IMPLANT

## 2019-03-20 NOTE — TOC Progression Note (Signed)
Transition of Care [facility]) - Progression Note    Patient Details  Name: [name] MRN: [id number] Date of Birth: 1951/01/20  Transition of Care [facility] CM/SW [place], [place] Phone Number: 03/20/2019, 4:10 PM  Clinical Narrative:     CSW acknowledges consult for SNF placement. Will follow for CIR, PT and OT recommendations.  [name], MSW, [place] Clinical Social Worker [phone]            Expected Discharge Plan and Services                                                 Social Determinants of Health (SDOH) Interventions    Readmission Risk Interventions No flowsheet data found.

## 2019-03-20 NOTE — Op Note (Addendum)
    Patient name: [name] MRN: [id number] DOB: 04-Jul-1951 Sex: male  03/20/2019 Pre-operative Diagnosis: Bilateral lower extremity ulcers Post-operative diagnosis:  Same Surgeon:  Annamarie Major Procedure Performed:  1.  Ultrasound-guided access, left femoral artery  2.  Abdominal aortogram with CO2  3.  Bilateral lower extremity runoff  4.  Second-order catheterization  5.  Conscious sedation (43 minutes)  6.  Closure device (Mynx)   Indications: The patient has bilateral lower extremity ulcers and osteomyelitis on the right.  He comes in today for further evaluation and possible intervention  Procedure:  The patient was identified in the holding area and taken to room 8.  The patient was then placed supine on the table and prepped and draped in the usual sterile fashion.  A time out was called.  Conscious sedation was administered with the use of IV fentanyl and Versed under continuous physician and nurse monitoring.  Heart rate, blood pressure, and oxygen saturation were continuously monitored.  Total sedation time was 43 minutes.  Ultrasound was used to evaluate the left common femoral artery.  It was patent .  A digital ultrasound image was acquired.  A micropuncture needle was used to access the left common femoral artery under ultrasound guidance.  An 018 wire was advanced without resistance and a micropuncture sheath was placed.  The 018 wire was removed and a benson wire was placed.  The micropuncture sheath was exchanged for a 5 french sheath.  An omniflush catheter was advanced over the wire to the level of L-1.  An abdominal angiogram with CO2 was obtained.  Pelvic imaging and different obliquities was also performed with CO2.  Next, using the omniflush catheter and a benson wire, as well as a 4 [place] straight catheter the aortic bifurcation was crossed and the catheter was placed into theright external iliac artery and right runoff was obtained.  left runoff was performed via  retrograde sheath injections.  Findings:   Aortogram: Abdominal vessels were not well-visualized due to patient movement.  However, the infrarenal abdominal aorta appeared to be calcified but patent without stenosis.  The right common and external iliac arteries were calcified but patent without significant stenosis.  There did appear to be a stenosis within the left external iliac artery.  Right Lower Extremity: Imaging was very limited due to patient movement however there is a diminutive profundofemoral artery and flush occlusion of the right superficial femoral artery.  No reconstitution of any named vessels was not visualized.  Left Lower Extremity: The common femoral artery is patent but calcified.  The profundofemoral artery is without significant stenosis but very diminutive.  There is a flush occlusion of the superficial femoral artery.  No named vessel is visualized in the lower leg.  Intervention: None  Impression:  #1  Bilateral superficial femoral artery occlusions without reconstitution of a named distal vessel.  The patient is not a candidate for revascularization either open or percutaneous.     Theotis Burrow, M.D., [facility] [place] Office: [phone] Pager:  [phone]

## 2019-03-20 NOTE — Plan of Care (Signed)
  Problem: Education: Goal: Knowledge of General Education information will improve Description Including pain rating scale, medication(s)/side effects and non-pharmacologic comfort measures Outcome: Progressing   

## 2019-03-20 NOTE — Progress Notes (Addendum)
Angiography was performed today.  The patient has bilateral superficial femoral artery occlusion without reconstitution.  He has no options for revascularization either open surgical or percutaneous.  He will most likely require amputation however I think it is reasonable to treat him with IV antibiotics for his osteomyelitis as well as wound care which includes pressure offloading of both feet to see if he will be able to heal either of his lower extremity wounds.  This can be done as an outpatient.  I will have him scheduled to follow-up with me in the office in 4-6 weeks for a wound check.  I spoke with Dr.Duda and told him he would not need to consult on the patient..  If the patient goes on to require amputation, I would be happy to take care of this since I have been involved with his vascular disease.  [name]

## 2019-03-20 NOTE — Progress Notes (Addendum)
ANTICOAGULATION CONSULT NOTE - follow up Pharmacy Consult for Heparin Indication: atrial fibrillation while apixaban on hold  No Known Allergies  Patient Measurements: Height: 6\' 2"  (188 cm) Weight: 175 lb 4.3 oz (79.5 kg) IBW/kg (Calculated) : 82.2 Heparin Dosing Weight: 79.5 kg  Vital Signs: Temp: 98.3 F (36.8 C) (07/17 1125) Temp Source: Oral (07/17 1125) BP: 124/92 (07/17 1125) Pulse Rate: 91 (07/17 1125)  Labs: Recent Labs    03/18/19 0352  03/19/19 0434 03/19/19 1500 03/20/19 0629 03/20/19 0912  HGB 10.8*  --  10.8*  --  10.4*  --   HCT 33.0*  --  34.2*  --  33.1*  --   PLT 279  --  276  --  276  --   APTT  --    < > 126* >200* 103* 112*  HEPARINUNFRC  --   --  >2.20*  --  1.20*  --   CREATININE 1.80*  --  1.88*  --  1.82*  --    < > = values in this interval not displayed.    Estimated Creatinine Clearance: 44.3 mL/min (A) (by C-G formula based on SCr of 1.82 mg/dL (H)).   Medical History: Past Medical History:  Diagnosis Date  . Alcoholism ([place])   . Coronary artery disease   . Dementia ([place])   . Hyperlipidemia   . Hypertension   . Paroxysmal atrial fibrillation (HCC)   . Stroke [facility])      Assessment: 68 yo male with Afib on apixaban PTA admitted with limb ischemia of BLE  With a foot wound and osteomyelitis. Patient to have arteriogram with possible intervention on Friday 7/17. Apixaban has been held and heparin was started to bridge patient through procedure.  Using aPTT initially to monitor heparin due to effect of apixaban on heparin levels.   APTT = 103 seconds on IV heparin rate 900 units/hr.  Possibly drawn from central line thus ordered stat aPTT peripherally and heparin rate reduced to 850  Units/hr APTT = 112, ordered to draw this peripherally , then patient went for abdominal aortogram w/LE angiography this morning.  Heparin drip off currently upon return to nursing unit. Hgb low stable 10.4, pltc wnl stable   Goal of Therapy:  Heparin  level 0.3-0.7 units/ml aPTT 66-102 seconds Monitor platelets by anticoagulation protocol: Yes   Plan:  Heparin drip currently off per [facility].   Will follow up with Dr. Tawanna Solo if wants to resume IV heparin or restart his oral apixabn for h/o Afib.   Thank you for allowing pharmacy to be part of this patients care team.  Nicole Cella, [place] Please check AMION for all [place] phone numbers After 10:00 PM, call [place] 03/20/2019,11:33 AM    ADDENDUM: Dr. Tawanna Solo said to restart IV heparin as may need another surgery. PLAN: -restart IV hepairn drip 800 units/hr Check 6h aPTT - lab draw (not from central line) Daily aPTT, HL, CBC   Nicole Cella, RPh Clinical Pharmacist Please check AMION for all [place] phone numbers After 10:00 PM, call [place] [phone] 03/20/2019 1:38 PM

## 2019-03-20 NOTE — Interval H&P Note (Signed)
History and Physical Interval Note:  03/20/2019 9:31 AM  [name]  has presented today for surgery, with the diagnosis of PVD ulcer.  The various methods of treatment have been discussed with the patient and family. After consideration of risks, benefits and other options for treatment, the patient has consented to  Procedure(s): ABDOMINAL AORTOGRAM W/LOWER EXTREMITY (Bilateral) as a surgical intervention.  The patient's history has been reviewed, patient examined, no change in status, stable for surgery.  I have reviewed the patient's chart and labs.  Questions were answered to the patient's satisfaction.     [name]

## 2019-03-20 NOTE — Progress Notes (Signed)
PROGRESS NOTE    [name]  EXH:[id number] DOB: May 04, 1951 DOA: 03/14/2019 PCP: [name], MD   Brief Narrative:  Patient is a 68 year old with history of moderate dementia with occasional behavioral disturbances, CAD status post MI, paroxysmal A. fib on Eliquis, CKD stage IV, essential hypertension, hyperlipidemia, history of substance use and alcohol use brought to the hospital for evaluation of right lower extremity foot wound.  Currently this has been ongoing for the past almost 2 weeks. His ABIs showed severe peripheral vascular disease therefore vascular surgery consulted.  MRI showed marrow edema in the lateral periphery of the fifth metatarsal subjacent to a skin wound is consistent with osteomyelitis.  Hospital course also complicated by intermittent hypoglycemia secondary to starting antidiabetic medication. Vascular surgery did  arteriogram on 03/20/19.  He was found to have bilateral superficial femoral artery occlusion without reconstitution.  He had no options for revascularization.  Most likely will require amputation.  After discussion with vascular surgery, we decided to change his antibiotics to oral doxycycline which will be continued for 4 to 6 weeks.  He will follow-up with vascular surgery in 4 to 6 weeks for wound check and possible amputation.  Patient is currently awaiting bed at CIR/skilled nursing facility. Patient is medically stable for discharge to skilled nursing facility/CIR soon as bed is available.   Assessment & Plan:   Principal Problem:   Osteomyelitis of right foot ([place]) Active Problems:   Dementia ([name])   Wound of right foot   Coronary artery disease   Hyperlipidemia   Hypertension   Paroxysmal atrial fibrillation ([facility])   CKD (chronic kidney disease), stage IV ([facility])   Pressure injury of skin   Right foot ulceration: Patient presented with dry gangrenous changes, ulcer of the right lateral foot and  heel.  MRI showed marrow edema in the  lateral periphery of the fifth metatarsal subjacent to a skin wound is consistent with osteomyelitis.  Was on cefazolin.  Patient is not septic.  After discussion with vascular surgery, since the wound is chronic we decided to send antibiotics to doxycycline. Continue wound care.  Hemoglobin A1c of 6.4.  Lipid panel showed LDL of 79. He will follow-up with vascular surgery in 4 to 6 weeks for possible amputation.  Severe peripheral vascular disease: Vascular surgery did  arteriogram on 03/20/19.  He was found to have bilateral superficial femoral artery occlusion without reconstitution.  He had no options for revascularization.  Most likely will require amputation  Paroxysmal A. fib: On Eliquis at home.    On amiodarone also.  Lopressor held due to bradycardia  AKI on CKD stage IV: Kidney function improved with IV fluids.  Currently kidney function is at baseline.  Hypertension: Currently antihypertensives are on hold.  Blood pressure stable.  Hyperlipidemia: Continue statin  Diabetes type 2/hypoglycemia: HbA1C of 6.4.  Continue sliding scale insulin for now.  He was started on  glipizide .  Hypoglycemia suspected due to glipizide.Now stopped.  Moderate vascular dementia with behavioral changes: Continue supportive care.  Patient is intermittently confused.  He says he lives with his niece.              DVT prophylaxis: Heparin Code Status: Full Family Communication: None Disposition Plan: CIR/SNF  As soon as bed is available   Consultants: Vascular surgery  Procedures: None  Antimicrobials:  Anti-infectives (From admission, onward)   Start     Dose/Rate Route Frequency Ordered Stop   03/20/19 1345  doxycycline (VIBRA-TABS) tablet 100 mg  100 mg Oral Every 12 hours 03/20/19 1337     03/17/19 1400  ceFAZolin (ANCEF) IVPB 2g/100 mL premix  Status:  Discontinued     2 g 200 mL/hr over 30 Minutes Intravenous Every 8 hours 03/17/19 0810 03/20/19 1337   03/16/19 1400   ceFAZolin (ANCEF) IVPB 1 g/50 mL premix  Status:  Discontinued     1 g 100 mL/hr over 30 Minutes Intravenous Every 8 hours 03/16/19 1043 03/17/19 0810   03/15/19 2000  vancomycin (VANCOCIN) 1,250 mg in sodium chloride 0.9 % 250 mL IVPB  Status:  Discontinued     1,250 mg 166.7 mL/hr over 90 Minutes Intravenous Every 24 hours 03/14/19 1844 03/16/19 1036   03/14/19 2300  cefTRIAXone (ROCEPHIN) 2 g in sodium chloride 0.9 % 100 mL IVPB  Status:  Discontinued     2 g 200 mL/hr over 30 Minutes Intravenous Every 24 hours 03/14/19 1837 03/16/19 1036   03/14/19 1930  vancomycin (VANCOCIN) 2,000 mg in sodium chloride 0.9 % 500 mL IVPB     2,000 mg 250 mL/hr over 120 Minutes Intravenous  Once 03/14/19 1844 03/14/19 2159   03/14/19 1700  piperacillin-tazobactam (ZOSYN) IVPB 2.25 g     2.25 g 100 mL/hr over 30 Minutes Intravenous  Once 03/14/19 1614 03/14/19 1800      Subjective:  Patient seen and examined the bedside this morning.  Currently looks comfortable, hemodynamically stable.  Was waiting for vascular intervention this morning.  Denies any complaints. Objective: Vitals:   03/19/19 1956 03/20/19 0305 03/20/19 0800 03/20/19 1125  BP: 138/79 131/76 139/78 (!) 124/92  Pulse: 67 61 67 91  Resp: 16 16 15 18   Temp: 98.4 F (36.9 C) 98.7 F (37.1 C) 98.6 F (37 C) 98.3 F (36.8 C)  TempSrc: Oral Oral Oral Oral  SpO2: 97% 100% 100% 97%  Weight:      Height:        Intake/Output Summary (Last 24 hours) at 03/20/2019 1341 Last data filed at 03/20/2019 1142 Gross per 24 hour  Intake -  Output 2400 ml  Net -2400 ml   Filed Weights   03/14/19 2040  Weight: 79.5 kg    Examination: General exam: Appears calm and comfortable ,Not in distress,average built HEENT:PERRL,Oral mucosa moist, Ear/Nose normal on gross exam Respiratory system: Bilateral equal air entry, normal vesicular breath sounds, no wheezes or crackles  Cardiovascular system: S1 & S2 heard, RRR. No JVD, murmurs, rubs,  gallops or clicks. Gastrointestinal system: Abdomen is nondistended, soft and nontender. No organomegaly or masses felt. Normal bowel sounds heard. Central nervous system: Alert and oriented. No focal neurological deficits. Extremities: Poor peripheral pulses. Skin:Ulcer on the lateral right foot and right heel      Data Reviewed: I have personally reviewed following labs and imaging studies  CBC: Recent Labs  Lab 03/14/19 1348  03/16/19 0531 03/17/19 0326 03/18/19 0352 03/19/19 0434 03/20/19 0629  WBC 7.6   < > 6.9 7.5 7.3 6.6 6.9  NEUTROABS 4.0  --   --   --   --   --   --   HGB 11.6*   < > 11.2* 11.6* 10.8* 10.8* 10.4*  HCT 37.7*   < > 34.8* 36.5* 33.0* 34.2* 33.1*  MCV 84.0   < > 81.3 82.4 80.5 83.2 83.0  PLT 345   < > 290 309 279 276 276   < > = values in this interval not displayed.   Basic Metabolic Panel: Recent Labs  Lab 03/16/19 0531 03/17/19 0326 03/18/19 0352 03/19/19 0434 03/20/19 0629  NA 137 138 136 138 138  K 4.4 4.4 4.4 4.7 4.9  CL 110 112* 110 110 112*  CO2 20* 17* 20* 22 23  GLUCOSE 96 63* 97 113* 91  BUN 30* 22 16 17 13   CREATININE 2.32* 1.87* 1.80* 1.88* 1.82*  CALCIUM 8.4* 8.8* 8.7* 8.8* 8.7*  MG 2.1 1.8 1.7 1.6* 2.0   GFR: Estimated Creatinine Clearance: 44.3 mL/min (A) (by C-G formula based on SCr of 1.82 mg/dL (H)). Liver Function Tests: Recent Labs  Lab 03/14/19 1348  AST 43*  ALT 40  ALKPHOS 60  BILITOT 0.7  PROT 7.2  ALBUMIN 2.7*   No results for input(s): LIPASE, AMYLASE in the last 168 hours. No results for input(s): AMMONIA in the last 168 hours. Coagulation Profile: No results for input(s): INR, PROTIME in the last 168 hours. Cardiac Enzymes: No results for input(s): CKTOTAL, CKMB, CKMBINDEX, TROPONINI in the last 168 hours. BNP (last 3 results) No results for input(s): PROBNP in the last 8760 hours. HbA1C: No results for input(s): HGBA1C in the last 72 hours. CBG: Recent Labs  Lab 03/19/19 1229 03/19/19 1558  03/19/19 2017 03/20/19 0759 03/20/19 1333  GLUCAP 90 95 93 75 84   Lipid Profile: No results for input(s): CHOL, HDL, LDLCALC, TRIG, CHOLHDL, LDLDIRECT in the last 72 hours. Thyroid Function Tests: No results for input(s): TSH, T4TOTAL, FREET4, T3FREE, THYROIDAB in the last 72 hours. Anemia Panel: No results for input(s): VITAMINB12, FOLATE, FERRITIN, TIBC, IRON, RETICCTPCT in the last 72 hours. Sepsis Labs: Recent Labs  Lab 03/14/19 1348 03/14/19 1731  LATICACIDVEN 1.2 1.1    Recent Results (from the past 240 hour(s))  SARS Coronavirus 2 (CEPHEID - Performed in [facility] hospital lab), Hosp Order     Status: None   Collection Time: 03/14/19  5:31 PM   Specimen: Nasopharyngeal Swab  Result Value Ref Range Status   SARS Coronavirus 2 NEGATIVE NEGATIVE Final    Comment: (NOTE) If result is NEGATIVE SARS-CoV-2 target nucleic acids are NOT DETECTED. The SARS-CoV-2 RNA is generally detectable in upper and lower  respiratory specimens during the acute phase of infection. The lowest  concentration of SARS-CoV-2 viral copies this assay can detect is 250  copies / mL. A negative result does not preclude SARS-CoV-2 infection  and should not be used as the sole basis for treatment or other  patient management decisions.  A negative result may occur with  improper specimen collection / handling, submission of specimen other  than nasopharyngeal swab, presence of viral mutation(s) within the  areas targeted by this assay, and inadequate number of viral copies  (<250 copies / mL). A negative result must be combined with clinical  observations, patient history, and epidemiological information. If result is POSITIVE SARS-CoV-2 target nucleic acids are DETECTED. The SARS-CoV-2 RNA is generally detectable in upper and lower  respiratory specimens dur ing the acute phase of infection.  Positive  results are indicative of active infection with SARS-CoV-2.  Clinical  correlation with  patient history and other diagnostic information is  necessary to determine patient infection status.  Positive results do  not rule out bacterial infection or co-infection with other viruses. If result is PRESUMPTIVE POSTIVE SARS-CoV-2 nucleic acids MAY BE PRESENT.   A presumptive positive result was obtained on the submitted specimen  and confirmed on repeat testing.  While 2019 novel coronavirus  (SARS-CoV-2) nucleic acids may be present in the  submitted sample  additional confirmatory testing may be necessary for epidemiological  and / or clinical management purposes  to differentiate between  SARS-CoV-2 and other Sarbecovirus currently known to infect humans.  If clinically indicated additional testing with an alternate test  methodology [phone]) is advised. The SARS-CoV-2 RNA is generally  detectable in upper and lower respiratory sp ecimens during the acute  phase of infection. The expected result is Negative. Fact Sheet for Patients:  StrictlyIdeas.no Fact Sheet for Healthcare Providers: BankingDealers.co.za This test is not yet approved or cleared by the [place] FDA and has been authorized for detection and/or diagnosis of SARS-CoV-2 by FDA under an Emergency Use Authorization (EUA).  This EUA will remain in effect (meaning this test can be used) for the duration of the COVID-19 declaration under Section 564(b)(1) of the Act, 21 U.S.C. section 360bbb-3(b)(1), unless the authorization is terminated or revoked sooner. Performed at [facility], [place] [address] [place], [place] [address]   Wound or Superficial Culture     Status: None   Collection Time: 03/14/19  6:01 PM   Specimen: Ulcer; Wound  Result Value Ref Range Status   Specimen Description ULCER  Final   Special Requests NONE  Final   Gram Stain NO WBC SEEN RARE GRAM POSITIVE COCCI   Final   Culture   Final    NORMAL SKIN FLORA Performed at [facility], [address] [place], [place] [address]    Report Status 03/17/2019 FINAL  Final         Radiology Studies: No results found.      Scheduled Meds: . amiodarone  200 mg Oral BID  . apixaban  5 mg Oral BID  . aspirin EC  81 mg Oral Daily  . atorvastatin  40 mg Oral Daily  . doxycycline  100 mg Oral Q12H  . insulin aspart  0-5 Units Subcutaneous QHS  . insulin aspart  0-9 Units Subcutaneous TID WC  . sodium chloride flush  10-40 mL Intracatheter Q12H  . sodium chloride flush  3 mL Intravenous Q12H   Continuous Infusions: . sodium chloride 100 mL/hr at 03/20/19 1156  . sodium chloride    . sodium chloride       LOS: 6 days    Time spent: 35 mins.More than 50% of that time was spent in counseling and/or coordination of care.      Shelly Coss, MD [facility] Pager [phone]  If 7PM-7AM, please contact night-coverage [email or web address] Password [facility] 03/20/2019, 1:41 PM

## 2019-03-21 LAB — CBC
HCT: 29.1 % — ABNORMAL LOW (ref 39.0–52.0)
Hemoglobin: 9.4 g/dL — ABNORMAL LOW (ref 13.0–17.0)
MCH: 26.5 pg (ref 26.0–34.0)
MCHC: 32.3 g/dL (ref 30.0–36.0)
MCV: 82 fL (ref 80.0–100.0)
Platelets: 216 10*3/uL (ref 150–400)
RBC: 3.55 MIL/uL — ABNORMAL LOW (ref 4.22–5.81)
RDW: 15.9 % — ABNORMAL HIGH (ref 11.5–15.5)
WBC: 8.7 10*3/uL (ref 4.0–10.5)
nRBC: 0 % (ref 0.0–0.2)

## 2019-03-21 LAB — GLUCOSE, CAPILLARY
Glucose-Capillary: 77 mg/dL (ref 70–99)
Glucose-Capillary: 134 mg/dL — ABNORMAL HIGH (ref 70–99)
Glucose-Capillary: 98 mg/dL (ref 70–99)
Glucose-Capillary: 88 mg/dL (ref 70–99)

## 2019-03-21 NOTE — Progress Notes (Addendum)
[facility]   Subjective  - No complaints.   Objective 132/75 76 98.7 F (37.1 C) (Oral) 14 100%  Intake/Output Summary (Last 24 hours) at 03/21/2019 0923 Last data filed at 03/21/2019 0511 Gross per 24 hour  Intake 683.98 ml  Output 1175 ml  Net -491.02 ml   Left groin c/d/i - no hematoma  Laboratory Lab Results: Recent Labs    03/20/19 0629 03/21/19 0808  WBC 6.9 8.7  HGB 10.4* 9.4*  HCT 33.1* 29.1*  PLT 276 216   BMET Recent Labs    03/19/19 0434 03/20/19 0629  NA 138 138  K 4.7 4.9  CL 110 112*  CO2 22 23  GLUCOSE 113* 91  BUN 17 13  CREATININE 1.88* 1.82*  CALCIUM 8.8* 8.7*    COAG No results found for: INR, PROTIME No results found for: PTT  Assessment/Planning:  As noted by Dr. Trula Slade, angiography was performed yesterday.  The patient has bilateral superficial femoral artery occlusion without reconstitution.  He has no options for revascularization either open surgical or percutaneous.  He will most likely require amputation however it is reasonable to treat him with antibiotics for his osteomyelitis (PO antibiotics ok as discussed with Dr. Trula Slade yesterday) as well as wound care which includes pressure offloading of both feet to see if he will be able to heal either of his lower extremity wounds.  This can be done as an outpatient.  He is scheduled to follow-up with Dr. Trula Slade in the office in 4-6 weeks for a wound check.   Marty Heck 03/21/2019 9:23 AM --

## 2019-03-21 NOTE — Progress Notes (Signed)
PROGRESS NOTE    [name]  XUX:[id number] DOB: [birth year] DOA: 03/14/2019 PCP: [name], MD   Brief Narrative:  Patient is a 68 year old with history of moderate dementia with occasional behavioral disturbances, CAD status post MI, paroxysmal A. fib on Eliquis, CKD stage IV, essential hypertension, hyperlipidemia, history of substance use and alcohol use brought to the hospital for evaluation of right lower extremity foot wound.  Currently this has been ongoing for the past almost 2 weeks. His ABIs showed severe peripheral vascular disease therefore vascular surgery consulted.  MRI showed marrow edema in the lateral periphery of the fifth metatarsal subjacent to a skin wound is consistent with osteomyelitis.  Hospital course also complicated by intermittent hypoglycemia secondary to starting antidiabetic medication. Vascular surgery did  arteriogram on 03/20/19.  He was found to have bilateral superficial femoral artery occlusion without reconstitution.  He had no options for revascularization.  Most likely will require amputation.  After discussion with vascular surgery, we decided to change his antibiotics to oral doxycycline which will be continued for 4 to 6 weeks.  He will follow-up with vascular surgery in 4 to 6 weeks for wound check and possible amputation.  Patient is currently awaiting bed at CIR/skilled nursing facility. Patient is medically stable for discharge to skilled nursing facility/CIR soon as bed is available.   Assessment & Plan:   Principal Problem:   Osteomyelitis of right foot ([place]) Active Problems:   Dementia ([place])   Wound of right foot   Coronary artery disease   Hyperlipidemia   Hypertension   Paroxysmal atrial fibrillation ([facility])   CKD (chronic kidney disease), stage IV ([facility])   Pressure injury of skin   Right foot ulceration: Patient presented with dry gangrenous changes, ulcer of the right lateral foot and  heel.  MRI showed marrow edema in the  lateral periphery of the fifth metatarsal subjacent to a skin wound is consistent with osteomyelitis.  Was on cefazolin.  Patient is not septic.  After discussion with vascular surgery, since the wound is chronic we decided to send antibiotics to doxycycline. Continue wound care.  Hemoglobin A1c of 6.4.  Lipid panel showed LDL of 79. He will follow-up with vascular surgery in 4 to 6 weeks for possible amputation.  Severe peripheral vascular disease: Vascular surgery did  arteriogram on 03/20/19.  He was found to have bilateral superficial femoral artery occlusion without reconstitution.  He had no options for revascularization.  Most likely, will require amputation  Paroxysmal A. fib: On Eliquis at home.    On amiodarone also.  Lopressor held due to bradycardia  AKI on CKD stage IV: Kidney function improved with IV fluids.  Currently kidney function is at baseline.  Hypertension: Currently antihypertensives are on hold.  Blood pressure stable.  Hyperlipidemia: Continue statin  Diabetes type 2/hypoglycemia: HbA1C of 6.4.  Continue sliding scale insulin for now.  He was started on  glipizide .  Hypoglycemia suspected due to glipizide.Now stopped.  Moderate vascular dementia with behavioral changes: Continue supportive care.  Patient is intermittently confused.  He says he lives with his niece.              DVT prophylaxis: Heparin Code Status: Full Family Communication: None Disposition Plan: CIR/SNF  As soon as bed is available   Consultants: Vascular surgery  Procedures: None  Antimicrobials:  Anti-infectives (From admission, onward)   Start     Dose/Rate Route Frequency Ordered Stop   03/20/19 1345  doxycycline (VIBRA-TABS) tablet 100 mg  100 mg Oral Every 12 hours 03/20/19 1337     03/17/19 1400  ceFAZolin (ANCEF) IVPB 2g/100 mL premix  Status:  Discontinued     2 g 200 mL/hr over 30 Minutes Intravenous Every 8 hours 03/17/19 0810 03/20/19 1337   03/16/19 1400   ceFAZolin (ANCEF) IVPB 1 g/50 mL premix  Status:  Discontinued     1 g 100 mL/hr over 30 Minutes Intravenous Every 8 hours 03/16/19 1043 03/17/19 0810   03/15/19 2000  vancomycin (VANCOCIN) 1,250 mg in sodium chloride 0.9 % 250 mL IVPB  Status:  Discontinued     1,250 mg 166.7 mL/hr over 90 Minutes Intravenous Every 24 hours 03/14/19 1844 03/16/19 1036   03/14/19 2300  cefTRIAXone (ROCEPHIN) 2 g in sodium chloride 0.9 % 100 mL IVPB  Status:  Discontinued     2 g 200 mL/hr over 30 Minutes Intravenous Every 24 hours 03/14/19 1837 03/16/19 1036   03/14/19 1930  vancomycin (VANCOCIN) 2,000 mg in sodium chloride 0.9 % 500 mL IVPB     2,000 mg 250 mL/hr over 120 Minutes Intravenous  Once 03/14/19 1844 03/14/19 2159   03/14/19 1700  piperacillin-tazobactam (ZOSYN) IVPB 2.25 g     2.25 g 100 mL/hr over 30 Minutes Intravenous  Once 03/14/19 1614 03/14/19 1800      Subjective:  Patient seen and examined the bedside this morning.  Hemodynamically stable.  Looks comfortable.  Denies any new complaints.  Pain on the right foot well controlled.  Objective: Vitals:   03/20/19 1125 03/20/19 1933 03/21/19 0505 03/21/19 0843  BP: (!) 124/92 128/73 (!) 147/73 132/75  Pulse: 91 93 71 76  Resp: 18 20 12 14   Temp: 98.3 F (36.8 C) 98.5 F (36.9 C) 98.6 F (37 C) 98.7 F (37.1 C)  TempSrc: Oral Oral Oral Oral  SpO2: 97% 99% 99% 100%  Weight:      Height:        Intake/Output Summary (Last 24 hours) at 03/21/2019 1316 Last data filed at 03/21/2019 1005 Gross per 24 hour  Intake 803.98 ml  Output 375 ml  Net 428.98 ml   Filed Weights   03/14/19 2040  Weight: 79.5 kg    Examination: General exam: Appears calm and comfortable ,Not in distress HEENT:Oral mucosa moist, Ear/Nose normal on gross exam Respiratory system: Bilateral equal air entry, normal vesicular breath sounds, no wheezes or crackles  Cardiovascular system: S1 & S2 heard, RRR. No JVD, murmurs, rubs, gallops or  clicks. Gastrointestinal system: Abdomen is nondistended, soft and nontender.  Central nervous system: Alert and oriented. No focal neurological deficits. Extremities: Poor peripheral pulses. Skin:Ulcer on the lateral right foot and right heel      Data Reviewed: I have personally reviewed following labs and imaging studies  CBC: Recent Labs  Lab 03/14/19 1348  03/17/19 0326 03/18/19 0352 03/19/19 0434 03/20/19 0629 03/21/19 0808  WBC 7.6   < > 7.5 7.3 6.6 6.9 8.7  NEUTROABS 4.0  --   --   --   --   --   --   HGB 11.6*   < > 11.6* 10.8* 10.8* 10.4* 9.4*  HCT 37.7*   < > 36.5* 33.0* 34.2* 33.1* 29.1*  MCV 84.0   < > 82.4 80.5 83.2 83.0 82.0  PLT 345   < > 309 279 276 276 216   < > = values in this interval not displayed.   Basic Metabolic Panel: Recent Labs  Lab 03/16/19 0531 03/17/19 0326  03/18/19 0352 03/19/19 0434 03/20/19 0629  NA 137 138 136 138 138  K 4.4 4.4 4.4 4.7 4.9  CL 110 112* 110 110 112*  CO2 20* 17* 20* 22 23  GLUCOSE 96 63* 97 113* 91  BUN 30* 22 16 17 13   CREATININE 2.32* 1.87* 1.80* 1.88* 1.82*  CALCIUM 8.4* 8.8* 8.7* 8.8* 8.7*  MG 2.1 1.8 1.7 1.6* 2.0   GFR: Estimated Creatinine Clearance: 44.3 mL/min (A) (by C-G formula based on SCr of 1.82 mg/dL (H)). Liver Function Tests: Recent Labs  Lab 03/14/19 1348  AST 43*  ALT 40  ALKPHOS 60  BILITOT 0.7  PROT 7.2  ALBUMIN 2.7*   No results for input(s): LIPASE, AMYLASE in the last 168 hours. No results for input(s): AMMONIA in the last 168 hours. Coagulation Profile: No results for input(s): INR, PROTIME in the last 168 hours. Cardiac Enzymes: No results for input(s): CKTOTAL, CKMB, CKMBINDEX, TROPONINI in the last 168 hours. BNP (last 3 results) No results for input(s): PROBNP in the last 8760 hours. HbA1C: No results for input(s): HGBA1C in the last 72 hours. CBG: Recent Labs  Lab 03/20/19 1333 03/20/19 1715 03/20/19 2128 03/21/19 0622 03/21/19 1104  GLUCAP 84 132* 84 77 134*    Lipid Profile: No results for input(s): CHOL, HDL, LDLCALC, TRIG, CHOLHDL, LDLDIRECT in the last 72 hours. Thyroid Function Tests: No results for input(s): TSH, T4TOTAL, FREET4, T3FREE, THYROIDAB in the last 72 hours. Anemia Panel: No results for input(s): VITAMINB12, FOLATE, FERRITIN, TIBC, IRON, RETICCTPCT in the last 72 hours. Sepsis Labs: Recent Labs  Lab 03/14/19 1348 03/14/19 1731  LATICACIDVEN 1.2 1.1    Recent Results (from the past 240 hour(s))  SARS Coronavirus 2 (CEPHEID - Performed in [facility] hospital lab), Hosp Order     Status: None   Collection Time: 03/14/19  5:31 PM   Specimen: Nasopharyngeal Swab  Result Value Ref Range Status   SARS Coronavirus 2 NEGATIVE NEGATIVE Final    Comment: (NOTE) If result is NEGATIVE SARS-CoV-2 target nucleic acids are NOT DETECTED. The SARS-CoV-2 RNA is generally detectable in upper and lower  respiratory specimens during the acute phase of infection. The lowest  concentration of SARS-CoV-2 viral copies this assay can detect is 250  copies / mL. A negative result does not preclude SARS-CoV-2 infection  and should not be used as the sole basis for treatment or other  patient management decisions.  A negative result may occur with  improper specimen collection / handling, submission of specimen other  than nasopharyngeal swab, presence of viral mutation(s) within the  areas targeted by this assay, and inadequate number of viral copies  (<250 copies / mL). A negative result must be combined with clinical  observations, patient history, and epidemiological information. If result is POSITIVE SARS-CoV-2 target nucleic acids are DETECTED. The SARS-CoV-2 RNA is generally detectable in upper and lower  respiratory specimens dur ing the acute phase of infection.  Positive  results are indicative of active infection with SARS-CoV-2.  Clinical  correlation with patient history and other diagnostic information is  necessary to  determine patient infection status.  Positive results do  not rule out bacterial infection or co-infection with other viruses. If result is PRESUMPTIVE POSTIVE SARS-CoV-2 nucleic acids MAY BE PRESENT.   A presumptive positive result was obtained on the submitted specimen  and confirmed on repeat testing.  While 2019 novel coronavirus  (SARS-CoV-2) nucleic acids may be present in the submitted sample  additional confirmatory  testing may be necessary for epidemiological  and / or clinical management purposes  to differentiate between  SARS-CoV-2 and other Sarbecovirus currently known to infect humans.  If clinically indicated additional testing with an alternate test  methodology [phone]) is advised. The SARS-CoV-2 RNA is generally  detectable in upper and lower respiratory sp ecimens during the acute  phase of infection. The expected result is Negative. Fact Sheet for Patients:  StrictlyIdeas.no Fact Sheet for Healthcare Providers: BankingDealers.co.za This test is not yet approved or cleared by the [place] FDA and has been authorized for detection and/or diagnosis of SARS-CoV-2 by FDA under an Emergency Use Authorization (EUA).  This EUA will remain in effect (meaning this test can be used) for the duration of the COVID-19 declaration under Section 564(b)(1) of the Act, 21 U.S.C. section 360bbb-3(b)(1), unless the authorization is terminated or revoked sooner. Performed at [facility], [place] [address], [place], [place] [address]   Wound or Superficial Culture     Status: None   Collection Time: 03/14/19  6:01 PM   Specimen: Ulcer; Wound  Result Value Ref Range Status   Specimen Description ULCER  Final   Special Requests NONE  Final   Gram Stain NO WBC SEEN RARE GRAM POSITIVE COCCI   Final   Culture   Final    NORMAL SKIN FLORA Performed at [facility], [address] [place], [place] [address]    Report  Status 03/17/2019 FINAL  Final         Radiology Studies: No results found.      Scheduled Meds: . amiodarone  200 mg Oral BID  . apixaban  5 mg Oral BID  . aspirin EC  81 mg Oral Daily  . atorvastatin  40 mg Oral Daily  . doxycycline  100 mg Oral Q12H  . insulin aspart  0-5 Units Subcutaneous QHS  . insulin aspart  0-9 Units Subcutaneous TID WC  . sodium chloride flush  10-40 mL Intracatheter Q12H  . sodium chloride flush  3 mL Intravenous Q12H   Continuous Infusions: . sodium chloride 100 mL/hr at 03/21/19 1003  . sodium chloride       LOS: 7 days    Time spent: 35 mins.More than 50% of that time was spent in counseling and/or coordination of care.      Shelly Coss, MD [facility] Pager [phone]  If 7PM-7AM, please contact night-coverage [email or web address] Password [email or web address] 03/21/2019, 1:16 PM

## 2019-03-22 LAB — GLUCOSE, CAPILLARY
Glucose-Capillary: 66 mg/dL — ABNORMAL LOW (ref 70–99)
Glucose-Capillary: 87 mg/dL (ref 70–99)
Glucose-Capillary: 99 mg/dL (ref 70–99)
Glucose-Capillary: 87 mg/dL (ref 70–99)
Glucose-Capillary: 77 mg/dL (ref 70–99)

## 2019-03-22 LAB — CBC
HCT: 29.8 % — ABNORMAL LOW (ref 39.0–52.0)
Hemoglobin: 9.3 g/dL — ABNORMAL LOW (ref 13.0–17.0)
MCH: 26.3 pg (ref 26.0–34.0)
MCHC: 31.2 g/dL (ref 30.0–36.0)
MCV: 84.4 fL (ref 80.0–100.0)
Platelets: 185 10*3/uL (ref 150–400)
RBC: 3.53 MIL/uL — ABNORMAL LOW (ref 4.22–5.81)
RDW: 16.3 % — ABNORMAL HIGH (ref 11.5–15.5)
WBC: 6.3 10*3/uL (ref 4.0–10.5)
nRBC: 0 % (ref 0.0–0.2)

## 2019-03-22 NOTE — Progress Notes (Signed)
PROGRESS NOTE    [name]  IZT:[id number] DOB: 09-23-1950 DOA: 03/14/2019 PCP: [name], MD   Brief Narrative:  Patient is a 68 year old with history of moderate dementia with occasional behavioral disturbances, CAD status post MI, paroxysmal A. fib on Eliquis, CKD stage IV, essential hypertension, hyperlipidemia, history of substance use and alcohol use brought to the hospital for evaluation of right lower extremity foot wound.  Currently this has been ongoing for the past almost 2 weeks. His ABIs showed severe peripheral vascular disease therefore vascular surgery consulted.  MRI showed marrow edema in the lateral periphery of the fifth metatarsal subjacent to a skin wound is consistent with osteomyelitis.  Hospital course also complicated by intermittent hypoglycemia secondary to starting antidiabetic medication. Vascular surgery did  arteriogram on 03/20/19.  He was found to have bilateral superficial femoral artery occlusion without reconstitution.  He had no options for revascularization.  Most likely will require amputation.  After discussion with vascular surgery, we decided to change his antibiotics to oral doxycycline which will be continued for 4 to 6 weeks.  He will follow-up with vascular surgery in 4 to 6 weeks for wound check and possible amputation.  Patient is currently awaiting bed at CIR/skilled nursing facility. Patient is medically stable for discharge to skilled nursing facility/CIR soon as bed is available.   Assessment & Plan:   Principal Problem:   Osteomyelitis of right foot ([place]) Active Problems:   Dementia ([place])   Wound of right foot   Coronary artery disease   Hyperlipidemia   Hypertension   Paroxysmal atrial fibrillation ([facility])   CKD (chronic kidney disease), stage IV ([facility])   Pressure injury of skin   Right foot ulceration: Patient presented with dry gangrenous changes, ulcer of the right lateral foot and  heel.  MRI showed marrow edema in the  lateral periphery of the fifth metatarsal subjacent to a skin wound is consistent with osteomyelitis.  Was on cefazolin.  Patient is not septic.  After discussion with vascular surgery, since the wound is chronic we decided to send antibiotics to doxycycline. Continue wound care.  Hemoglobin A1c of 6.4.  Lipid panel showed LDL of 79. He will follow-up with vascular surgery in 4 to 6 weeks for possible amputation.  Severe peripheral vascular disease: Vascular surgery did  arteriogram on 03/20/19.  He was found to have bilateral superficial femoral artery occlusion without reconstitution.  He had no options for revascularization.  Most likely, will require amputation  Paroxysmal A. fib: On Eliquis at home.    On amiodarone also.  Lopressor held due to bradycardia  AKI on CKD stage IV: Kidney function improved with IV fluids.  Currently kidney function is at baseline.  Hypertension: Currently antihypertensives are on hold.  Blood pressure stable.  Hyperlipidemia: Continue statin  Diabetes type 2/hypoglycemia: HbA1C of 6.4.  Continue sliding scale insulin for now.  He was started on  glipizide .  Hypoglycemia suspected due to glipizide.Now stopped.  Moderate vascular dementia with behavioral changes: Continue supportive care.  Patient is intermittently confused.  He says he lives with his niece.              DVT prophylaxis: Heparin Code Status: Full Family Communication: None Disposition Plan: CIR/SNF  As soon as bed is available   Consultants: Vascular surgery  Procedures: None  Antimicrobials:  Anti-infectives (From admission, onward)   Start     Dose/Rate Route Frequency Ordered Stop   03/20/19 1345  doxycycline (VIBRA-TABS) tablet 100 mg  100 mg Oral Every 12 hours 03/20/19 1337     03/17/19 1400  ceFAZolin (ANCEF) IVPB 2g/100 mL premix  Status:  Discontinued     2 g 200 mL/hr over 30 Minutes Intravenous Every 8 hours 03/17/19 0810 03/20/19 1337   03/16/19 1400   ceFAZolin (ANCEF) IVPB 1 g/50 mL premix  Status:  Discontinued     1 g 100 mL/hr over 30 Minutes Intravenous Every 8 hours 03/16/19 1043 03/17/19 0810   03/15/19 2000  vancomycin (VANCOCIN) 1,250 mg in sodium chloride 0.9 % 250 mL IVPB  Status:  Discontinued     1,250 mg 166.7 mL/hr over 90 Minutes Intravenous Every 24 hours 03/14/19 1844 03/16/19 1036   03/14/19 2300  cefTRIAXone (ROCEPHIN) 2 g in sodium chloride 0.9 % 100 mL IVPB  Status:  Discontinued     2 g 200 mL/hr over 30 Minutes Intravenous Every 24 hours 03/14/19 1837 03/16/19 1036   03/14/19 1930  vancomycin (VANCOCIN) 2,000 mg in sodium chloride 0.9 % 500 mL IVPB     2,000 mg 250 mL/hr over 120 Minutes Intravenous  Once 03/14/19 1844 03/14/19 2159   03/14/19 1700  piperacillin-tazobactam (ZOSYN) IVPB 2.25 g     2.25 g 100 mL/hr over 30 Minutes Intravenous  Once 03/14/19 1614 03/14/19 1800      Subjective:  Patient seen and examined at bedside this morning.  Was attempting to go to the bathroom.  Very weak and was hardly able to ambulate.  Denies any new complaints.  Waiting for CIR.  Objective: Vitals:   03/21/19 0843 03/21/19 2025 03/22/19 0336 03/22/19 0800  BP: 132/75 115/63 121/89 (!) 135/96  Pulse: 76 (!) 103 71 70  Resp: 14 16 18 16   Temp: 98.7 F (37.1 C) 98.3 F (36.8 C) 98.5 F (36.9 C) 98.5 F (36.9 C)  TempSrc: Oral Oral Oral Oral  SpO2: 100% 93% 100% 100%  Weight:      Height:        Intake/Output Summary (Last 24 hours) at 03/22/2019 1231 Last data filed at 03/22/2019 2197 Gross per 24 hour  Intake 840 ml  Output -  Net 840 ml   Filed Weights   03/14/19 2040  Weight: 79.5 kg    Examination: General exam: Appears calm and comfortable ,Not in distress,generalised weakness HEENT:Oral mucosa moist, Ear/Nose normal on gross exam Respiratory system: Bilateral equal air entry, normal vesicular breath sounds, no wheezes or crackles  Cardiovascular system: S1 & S2 heard, RRR. No JVD, murmurs, rubs,  gallops or clicks. Gastrointestinal system: Abdomen is nondistended, soft and nontender.  Central nervous system: Alert and oriented. No focal neurological deficits. Extremities: Poor peripheral pulses. Skin:Ulcer on the lateral right foot and right heel      Data Reviewed: I have personally reviewed following labs and imaging studies  CBC: Recent Labs  Lab 03/18/19 0352 03/19/19 0434 03/20/19 0629 03/21/19 0808 03/22/19 1104  WBC 7.3 6.6 6.9 8.7 6.3  HGB 10.8* 10.8* 10.4* 9.4* 9.3*  HCT 33.0* 34.2* 33.1* 29.1* 29.8*  MCV 80.5 83.2 83.0 82.0 84.4  PLT 279 276 276 216 588   Basic Metabolic Panel: Recent Labs  Lab 03/16/19 0531 03/17/19 0326 03/18/19 0352 03/19/19 0434 03/20/19 0629  NA 137 138 136 138 138  K 4.4 4.4 4.4 4.7 4.9  CL 110 112* 110 110 112*  CO2 20* 17* 20* 22 23  GLUCOSE 96 63* 97 113* 91  BUN 30* 22 16 17 13   CREATININE 2.32* 1.87* 1.80*  1.88* 1.82*  CALCIUM 8.4* 8.8* 8.7* 8.8* 8.7*  MG 2.1 1.8 1.7 1.6* 2.0   GFR: Estimated Creatinine Clearance: 44.3 mL/min (A) (by C-G formula based on SCr of 1.82 mg/dL (H)). Liver Function Tests: No results for input(s): AST, ALT, ALKPHOS, BILITOT, PROT, ALBUMIN in the last 168 hours. No results for input(s): LIPASE, AMYLASE in the last 168 hours. No results for input(s): AMMONIA in the last 168 hours. Coagulation Profile: No results for input(s): INR, PROTIME in the last 168 hours. Cardiac Enzymes: No results for input(s): CKTOTAL, CKMB, CKMBINDEX, TROPONINI in the last 168 hours. BNP (last 3 results) No results for input(s): PROBNP in the last 8760 hours. HbA1C: No results for input(s): HGBA1C in the last 72 hours. CBG: Recent Labs  Lab 03/21/19 1605 03/21/19 2042 03/22/19 0603 03/22/19 0630 03/22/19 1223  GLUCAP 98 88 66* 87 99   Lipid Profile: No results for input(s): CHOL, HDL, LDLCALC, TRIG, CHOLHDL, LDLDIRECT in the last 72 hours. Thyroid Function Tests: No results for input(s): TSH,  T4TOTAL, FREET4, T3FREE, THYROIDAB in the last 72 hours. Anemia Panel: No results for input(s): VITAMINB12, FOLATE, FERRITIN, TIBC, IRON, RETICCTPCT in the last 72 hours. Sepsis Labs: No results for input(s): PROCALCITON, LATICACIDVEN in the last 168 hours.  Recent Results (from the past 240 hour(s))  SARS Coronavirus 2 (CEPHEID - Performed in [place] hospital lab), Hosp Order     Status: None   Collection Time: 03/14/19  5:31 PM   Specimen: Nasopharyngeal Swab  Result Value Ref Range Status   SARS Coronavirus 2 NEGATIVE NEGATIVE Final    Comment: (NOTE) If result is NEGATIVE SARS-CoV-2 target nucleic acids are NOT DETECTED. The SARS-CoV-2 RNA is generally detectable in upper and lower  respiratory specimens during the acute phase of infection. The lowest  concentration of SARS-CoV-2 viral copies this assay can detect is 250  copies / mL. A negative result does not preclude SARS-CoV-2 infection  and should not be used as the sole basis for treatment or other  patient management decisions.  A negative result may occur with  improper specimen collection / handling, submission of specimen other  than nasopharyngeal swab, presence of viral mutation(s) within the  areas targeted by this assay, and inadequate number of viral copies  (<250 copies / mL). A negative result must be combined with clinical  observations, patient history, and epidemiological information. If result is POSITIVE SARS-CoV-2 target nucleic acids are DETECTED. The SARS-CoV-2 RNA is generally detectable in upper and lower  respiratory specimens dur ing the acute phase of infection.  Positive  results are indicative of active infection with SARS-CoV-2.  Clinical  correlation with patient history and other diagnostic information is  necessary to determine patient infection status.  Positive results do  not rule out bacterial infection or co-infection with other viruses. If result is PRESUMPTIVE POSTIVE SARS-CoV-2  nucleic acids MAY BE PRESENT.   A presumptive positive result was obtained on the submitted specimen  and confirmed on repeat testing.  While 2019 novel coronavirus  (SARS-CoV-2) nucleic acids may be present in the submitted sample  additional confirmatory testing may be necessary for epidemiological  and / or clinical management purposes  to differentiate between  SARS-CoV-2 and other Sarbecovirus currently known to infect humans.  If clinically indicated additional testing with an alternate test  methodology [phone]) is advised. The SARS-CoV-2 RNA is generally  detectable in upper and lower respiratory sp ecimens during the acute  phase of infection. The expected result is  Negative. Fact Sheet for Patients:  StrictlyIdeas.no Fact Sheet for Healthcare Providers: BankingDealers.co.za This test is not yet approved or cleared by the [place] FDA and has been authorized for detection and/or diagnosis of SARS-CoV-2 by FDA under an Emergency Use Authorization (EUA).  This EUA will remain in effect (meaning this test can be used) for the duration of the COVID-19 declaration under Section 564(b)(1) of the Act, 21 U.S.C. section 360bbb-3(b)(1), unless the authorization is terminated or revoked sooner. Performed at [facility], [place] [address], [place], [place] [address]   Wound or Superficial Culture     Status: None   Collection Time: 03/14/19  6:01 PM   Specimen: Ulcer; Wound  Result Value Ref Range Status   Specimen Description ULCER  Final   Special Requests NONE  Final   Gram Stain NO WBC SEEN RARE GRAM POSITIVE COCCI   Final   Culture   Final    NORMAL SKIN FLORA Performed at [facility], [address] [place], [place] [address]    Report Status 03/17/2019 FINAL  Final         Radiology Studies: No results found.      Scheduled Meds: . amiodarone  200 mg Oral BID  . apixaban  5 mg Oral BID  .  aspirin EC  81 mg Oral Daily  . atorvastatin  40 mg Oral Daily  . doxycycline  100 mg Oral Q12H  . insulin aspart  0-5 Units Subcutaneous QHS  . insulin aspart  0-9 Units Subcutaneous TID WC  . sodium chloride flush  10-40 mL Intracatheter Q12H  . sodium chloride flush  3 mL Intravenous Q12H   Continuous Infusions: . sodium chloride 100 mL/hr at 03/21/19 1003  . sodium chloride       LOS: 8 days    Time spent: 25 mins.More than 50% of that time was spent in counseling and/or coordination of care.      Shelly Coss, MD [facility] Pager [phone]  If 7PM-7AM, please contact night-coverage [email or web address] Password [facility] 03/22/2019, 12:31 PM

## 2019-03-22 NOTE — Progress Notes (Signed)
Hypoglycemic Event  CBG: 68  Treatment: 8 ounces OJ  Symptoms: none  Follow-up CBG: Time:6:30     vCBG Result:87  Possible Reasons for Event: diabetic medication and poor POs  Comments/MD notified:No    [name]

## 2019-03-23 LAB — BASIC METABOLIC PANEL
Anion gap: 5 (ref 5–15)
BUN: 16 mg/dL (ref 8–23)
CO2: 19 mmol/L — ABNORMAL LOW (ref 22–32)
Calcium: 8.6 mg/dL — ABNORMAL LOW (ref 8.9–10.3)
Chloride: 113 mmol/L — ABNORMAL HIGH (ref 98–111)
Creatinine, Ser: 1.43 mg/dL — ABNORMAL HIGH (ref 0.61–1.24)
GFR calc Af Amer: 58 mL/min — ABNORMAL LOW (ref >60)
GFR calc non Af Amer: 50 mL/min — ABNORMAL LOW (ref >60)
Glucose, Bld: 84 mg/dL (ref 70–99)
Potassium: 4.5 mmol/L (ref 3.5–5.1)
Sodium: 137 mmol/L (ref 135–145)

## 2019-03-23 LAB — GLUCOSE, CAPILLARY
Glucose-Capillary: 73 mg/dL (ref 70–99)
Glucose-Capillary: 105 mg/dL — ABNORMAL HIGH (ref 70–99)

## 2019-03-23 MED ORDER — DOXYCYCLINE HYCLATE 100 MG PO TABS: 100.0000 mg | ORAL_TABLET | Freq: Two times a day (BID) | ORAL | 0 refills | Status: AC

## 2019-03-23 NOTE — TOC Transition Note (Signed)
Transition of Care [facility]) - CM/SW Discharge Note Marvetta Gibbons RN, BSN Transitions of Care Unit 4E- RN Case Manager [phone]   Patient Details  Name: [name] MRN: [id number] Date of Birth: 1951/05/10  [facility]) CM/SW Contact:  Dawayne Patricia, RN Phone Number: 03/23/2019, 3:15 PM   Clinical Narrative:    Pt stable for transition home today, pt not a candidate for CIR and SNF recommended however pt's niece that he lives with does not want pt going to a STSNF for rehab and prefers for pt to return home with [facility]. CM spoke to niece on the phone to confirm this and offer choice for [facility] services- per niece her first choice is [facility] (alternate [facility])- per niece pt has RW and BSC at home. Requesting info on HCPOA- will sent packet of info home with pt. Referral for [facility] called to [facility] with [facility] for HHRN/PT/OT- referral has been accepted. Niece will come to transport pt home.    Final next level of care: Home w Home Health Services Barriers to Discharge: No Barriers Identified   Patient Goals and CMS Choice Patient states their goals for this hospitalization and ongoing recovery are:: "not going to SNF for rehab bringing him home"(per niece) CMS Medicare.gov Compare Post Acute Care list provided to:: Patient Represenative (must comment)(niece) Choice offered to / list presented to : (niece)  Discharge Placement  Home with [facility]                     Discharge Plan and Services In-house Referral: Clinical Social Work Discharge Planning Services: CM Consult Post Acute Care Choice: Home Health          DME Arranged: N/A DME Agency: NA       HH Arranged: RN, PT, OT HH Agency: [place] Date [place]: 03/23/19 Time [place]: [id number] Representative spoke with at [place]: [place] ([place]) Interventions     Readmission Risk Interventions Readmission Risk Prevention Plan 03/23/2019   Transportation Screening Complete  PCP or Specialist Appt within 5-7 Days Complete  Home Care Screening Complete  Medication Review (RN CM) Complete

## 2019-03-23 NOTE — Discharge Summary (Signed)
Physician Discharge Summary  [name] NGE:[id number] DOB: Sep 25, 1950 DOA: 03/14/2019  PCP: [name], MD  Admit date: 03/14/2019 Discharge date: 03/23/2019  Admitted From: Home Disposition:  Home  Discharge Condition:Stable CODE STATUS:FULL Diet recommendation: Heart Healthy   Brief/Interim Summary: Patient is a 68 year old with history of moderate dementia with occasional behavioral disturbances, CAD status post MI, paroxysmal A. fib on Eliquis, CKD stage IV, essential hypertension, hyperlipidemia, history of substance use and alcohol use brought to the hospital for evaluation of right lower extremity foot wound. Currently this has been ongoing for the past almost 2 weeks.His ABIs showed severe peripheral vascular disease therefore vascular surgery consulted.  MRI showed marrow edema in the lateral periphery of the fifth metatarsal subjacent to a skin wound is consistent with osteomyelitis.  Hospital course also complicated by intermittent hypoglycemia secondary to starting antidiabetic medication. Vascular surgery did  arteriogram on 03/20/19.  He was found to have bilateral superficial femoral artery occlusion without reconstitution.  He had no options for revascularization.  Most likely will require amputation.  After discussion with vascular surgery, we decided to change his antibiotics to oral doxycycline which will be continued for 4 to 6 weeks.  He will follow-up with vascular surgery in 4 to 6 weeks for wound check and possible amputation.   Patient was seen by physical therapy/Occupational Therapy and recommended CIR.  Patient's niece wanted to take him home.  Patient will be discharged home today with home health.  Following problems were addressed during his hospitalization:  Right foot ulceration: Patient presented with dry gangrenous changes, ulcer of the right lateral foot and  heel.  MRI showed marrow edema in the lateral periphery of the fifth metatarsal subjacent to  a skin wound is consistent with osteomyelitis.  Was on cefazolin.  Patient is not septic.  After discussion with vascular surgery, since the wound is chronic we decided to send antibiotics to doxycycline. Continue wound care.  Hemoglobin A1c of 6.4.  Lipid panel showed LDL of 79. He will follow-up with vascular surgery in 4 to 6 weeks for possible amputation.  Severe peripheral vascular disease: Vascular surgery did  arteriogram on 03/20/19.  He was found to have bilateral superficial femoral artery occlusion without reconstitution.  He had no options for revascularization.  Most likely, will require amputation  Paroxysmal A. fib: On Eliquis at home.    On amiodarone also.  Lopressor held due to bradycardia  AKI on CKD stage IV: Kidney function improved with IV fluids.  Currently kidney function is at baseline.  Hypertension: Blood pressure stable without any antihypertensives.  Antihypertensives discontinued on discharge.  Hyperlipidemia: Continue statin  Diabetes type 2/hypoglycemia: HbA1C of 6.4. He was started on  glipizide .  Hypoglycemia suspected due to glipizide.Now stopped.  Moderate vascular dementia with behavioral changes: Continue supportive care.  Patient is intermittently confused.  He says he lives with his niece.   Discharge Diagnoses:  Principal Problem:   Osteomyelitis of right foot ([place]) Active Problems:   Dementia ([place])   Wound of right foot   Coronary artery disease   Hyperlipidemia   Hypertension   Paroxysmal atrial fibrillation ([facility])   CKD (chronic kidney disease), stage IV ([facility])   Pressure injury of skin    Discharge Instructions  Discharge Instructions    Diet - low sodium heart healthy   Complete by: As directed    Discharge instructions   Complete by: As directed    1)Please follow-up with your primary doctor in a week.  Do a CBC, BMP test during the follow-up 2)Take prescribed medications as instructed. 3)Follow up with vascular surgery on  the given appointment date. 4)Follow up with home health.   Increase activity slowly   Complete by: As directed      Allergies as of 03/23/2019   No Known Allergies     Medication List    STOP taking these medications   lisinopril-hydrochlorothiazide 20-12.5 MG tablet Commonly known as: ZESTORETIC   metoprolol succinate 25 MG 24 hr tablet Commonly known as: TOPROL-XL     TAKE these medications   amiodarone 200 MG tablet Commonly known as: PACERONE Take 1 tablet (200 mg total) by mouth 2 (two) times daily.   aspirin EC 81 MG tablet Take 81 mg by mouth daily.   atorvastatin 40 MG tablet Commonly known as: LIPITOR Take 40 mg by mouth at bedtime.   doxycycline 100 MG tablet Commonly known as: VIBRA-TABS Take 1 tablet (100 mg total) by mouth every 12 (twelve) hours.   Eliquis 5 MG Tabs tablet Generic drug: apixaban Take 5 mg by mouth 2 (two) times daily with a meal.   folic acid 1 MG tablet Commonly known as: FOLVITE Take 1 mg by mouth daily.      Follow-up Information    Osei-Bonsu, Iona Beard, MD. Schedule an appointment as soon as possible for a visit in 1 week(s).   Specialty: Internal Medicine Contact information: [place] [address] [phone]          No Known Allergies  Consultations:  Vascular surgery   Procedures/Studies: Dg Abd 1 View  Result Date: 03/15/2019 CLINICAL DATA:  For MRI clearance EXAM: ABDOMEN - 1 VIEW COMPARISON:  None. FINDINGS: Nonobstructive bowel gas pattern. Visualized osseous structures are within normal limits. Vascular calcifications. IMPRESSION: Negative. No radiopaque foreign body is seen. Electronically Signed   By: Julian Hy M.D.   On: 03/15/2019 01:50   Mr Foot Right Wo Contrast  Result Date: 03/15/2019 CLINICAL DATA:  Wound at the lateral aspect of the right midfoot for 2 weeks. Question osteomyelitis. EXAM: MRI OF THE RIGHT FOREFOOT WITHOUT CONTRAST TECHNIQUE: Multiplanar,  multisequence MR imaging of the right forefoot was performed. No intravenous contrast was administered. COMPARISON:  Plain films right foot 03/14/2019 and 02/20/2019. FINDINGS: Bones/Joint/Cartilage There is marrow edema in the periphery of the base of the fifth metatarsal subjacent to a skin wound consistent with osteomyelitis. Edema extends approximately 2.5 cm from the base of the fifth metatarsal distally and approximately 1 cm deep to the lateral cortex. Marrow edema is also seen in the lateral periphery of the head of the talus measuring 2 cm in length by 1 cm transverse. Bone marrow signal is otherwise normal. No joint effusion or evidence of arthropathy. Ligaments Intact. Muscles and Tendons No intramuscular fluid collection.  Intact. Soft tissues Intense subcutaneous edema is present about the dorsum of the foot and visualized ankle. No focal fluid collection. IMPRESSION: Marrow edema in the lateral periphery of the fifth metatarsal subjacent to a skin wound is consistent with osteomyelitis. Negative for abscess or septic joint. Marrow edema in the lateral periphery of the head of the talus is nonspecific but given its distance from the patient's skin wound, osteomyelitis is unlikely. It may represent reactive or stress change. Intense subcutaneous edema over the dorsum of the foot could be due to dependent change and/or cellulitis. Electronically Signed   By: Inge Rise M.D.   On: 03/15/2019 09:41   Dg  Chest Port 1 View  Result Date: 03/15/2019 CLINICAL DATA:  For MRI clearance EXAM: PORTABLE CHEST 1 VIEW COMPARISON:  None. FINDINGS: Lungs are clear.  No pleural effusion or pneumothorax. The heart is top-normal in size. Postsurgical changes related to prior CABG. Median sternotomy. IMPRESSION: No evidence of acute cardiopulmonary disease. No radiopaque foreign body is seen. Electronically Signed   By: Julian Hy M.D.   On: 03/15/2019 01:50   Dg Foot Complete Right  Result Date:  03/14/2019 CLINICAL DATA:  Swelling in right foot. EXAM: RIGHT FOOT COMPLETE - 3+ VIEW COMPARISON:  February 20, 2019 FINDINGS: There appears to be a skin defect overlying the base of the fifth metatarsal seen on oblique images. There is mild new irregularity of the underlying fifth metatarsal base. Vascular calcifications. No fractures. Soft tissue swelling. IMPRESSION: There is a skin defect overlying the base of the fifth metatarsal with mild underlying bony irregularity. Findings are suspicious for subtle osteomyelitis. MRI could better assess. Soft tissue swelling. No other abnormalities. Electronically Signed   By: Dorise Bullion III M.D   On: 03/14/2019 15:49   Vas [place] Burnard Bunting With/wo Tbi  Result Date: 03/16/2019 LOWER EXTREMITY DOPPLER STUDY Indications: Ulceration. High Risk         Hypertension, hyperlipidemia, prior MI, coronary artery Factors:          disease. Other Factors: Paroxysmal A-Fib, on Eliquis, history of subsatance/alcohol                abuse.  Comparison Study: No prior study on file for comparison. Performing Technologist: Sharion Dove RVS  Examination Guidelines: A complete evaluation includes at minimum, Doppler waveform signals and systolic blood pressure reading at the level of bilateral brachial, anterior tibial, and posterior tibial arteries, when vessel segments are accessible. Bilateral testing is considered an integral part of a complete examination. Photoelectric Plethysmograph (PPG) waveforms and toe systolic pressure readings are included as required and additional duplex testing as needed. Limited examinations for reoccurring indications may be performed as noted.  ABI Findings: +---------+------------------+-----+-------------------+--------+ Right    Rt Pressure (mmHg)IndexWaveform           Comment  +---------+------------------+-----+-------------------+--------+ Brachial 130                    triphasic                    +---------+------------------+-----+-------------------+--------+ PTA      28                0.22 dampened monophasic         +---------+------------------+-----+-------------------+--------+ DP       47                0.36 dampened monophasic         +---------+------------------+-----+-------------------+--------+ Great Toe0                 0.00                             +---------+------------------+-----+-------------------+--------+ +---------+------------------+-----+-------------------+-------+ Left     Lt Pressure (mmHg)IndexWaveform           Comment +---------+------------------+-----+-------------------+-------+ Brachial 116                    triphasic                  +---------+------------------+-----+-------------------+-------+ PTA      72  0.55 dampened monophasic        +---------+------------------+-----+-------------------+-------+ DP       33                0.25 dampened monophasic        +---------+------------------+-----+-------------------+-------+ Great Toe0                 0.00                            +---------+------------------+-----+-------------------+-------+ +-------+-----------+-----------+------------+------------+ ABI/TBIToday's ABIToday's TBIPrevious ABIPrevious TBI +-------+-----------+-----------+------------+------------+ Right  0.36       0                                   +-------+-----------+-----------+------------+------------+ Left   0.55       0                                   +-------+-----------+-----------+------------+------------+  Summary: Right: Resting right ankle-brachial index indicates severe right lower extremity arterial disease. The right toe-brachial index is abnormal. Left: Resting left ankle-brachial index indicates moderate left lower extremity arterial disease. The left toe-brachial index is abnormal.  *See table(s) above for measurements and observations.   Electronically signed by Monica Martinez MD on 03/16/2019 at 5:22:28 PM.    Final    [place] Ekg Site Rite  Result Date: 03/16/2019 If Site Rite image not attached, placement could not be confirmed due to current cardiac rhythm.     Subjective: Patient seen and examined at the bedside this morning.  Appears comfortable.  Hemodynamically stable for discharge today to home with home health  Discharge Exam: Vitals:   03/23/19 0418 03/23/19 1000  BP: (!) 154/80 134/72  Pulse: 77 77  Resp: 18 18  Temp: 98.5 F (36.9 C) 97.9 F (36.6 C)  SpO2: 100% 100%   Vitals:   03/22/19 0800 03/22/19 2002 03/23/19 0418 03/23/19 1000  BP: (!) 135/96 137/60 (!) 154/80 134/72  Pulse: 70 71 77 77  Resp: 16 17 18 18   Temp: 98.5 F (36.9 C) 98.8 F (37.1 C) 98.5 F (36.9 C) 97.9 F (36.6 C)  TempSrc: Oral Oral Oral Oral  SpO2: 100% 100% 100% 100%  Weight:      Height:        General: Pt is alert, awake, not in acute distress Cardiovascular: RRR, S1/S2 +, no rubs, no gallops Respiratory: CTA bilaterally, no wheezing, no rhonchi Abdominal: Soft, NT, ND, bowel sounds + Extremities: no edema, no cyanosis, chronic foot ulcers on the right side    The results of significant diagnostics from this hospitalization (including imaging, microbiology, ancillary and laboratory) are listed below for reference.     Microbiology: Recent Results (from the past 240 hour(s))  SARS Coronavirus 2 (CEPHEID - Performed in [place] hospital lab), Hosp Order     Status: None   Collection Time: 03/14/19  5:31 PM   Specimen: Nasopharyngeal Swab  Result Value Ref Range Status   SARS Coronavirus 2 NEGATIVE NEGATIVE Final    Comment: (NOTE) If result is NEGATIVE SARS-CoV-2 target nucleic acids are NOT DETECTED. The SARS-CoV-2 RNA is generally detectable in upper and lower  respiratory specimens during the acute phase of infection. The lowest  concentration of SARS-CoV-2 viral copies this assay can detect is 250   copies /  mL. A negative result does not preclude SARS-CoV-2 infection  and should not be used as the sole basis for treatment or other  patient management decisions.  A negative result may occur with  improper specimen collection / handling, submission of specimen other  than nasopharyngeal swab, presence of viral mutation(s) within the  areas targeted by this assay, and inadequate number of viral copies  (<250 copies / mL). A negative result must be combined with clinical  observations, patient history, and epidemiological information. If result is POSITIVE SARS-CoV-2 target nucleic acids are DETECTED. The SARS-CoV-2 RNA is generally detectable in upper and lower  respiratory specimens dur ing the acute phase of infection.  Positive  results are indicative of active infection with SARS-CoV-2.  Clinical  correlation with patient history and other diagnostic information is  necessary to determine patient infection status.  Positive results do  not rule out bacterial infection or co-infection with other viruses. If result is PRESUMPTIVE POSTIVE SARS-CoV-2 nucleic acids MAY BE PRESENT.   A presumptive positive result was obtained on the submitted specimen  and confirmed on repeat testing.  While 2019 novel coronavirus  (SARS-CoV-2) nucleic acids may be present in the submitted sample  additional confirmatory testing may be necessary for epidemiological  and / or clinical management purposes  to differentiate between  SARS-CoV-2 and other Sarbecovirus currently known to infect humans.  If clinically indicated additional testing with an alternate test  methodology [phone]) is advised. The SARS-CoV-2 RNA is generally  detectable in upper and lower respiratory sp ecimens during the acute  phase of infection. The expected result is Negative. Fact Sheet for Patients:  StrictlyIdeas.no Fact Sheet for Healthcare  Providers: BankingDealers.co.za This test is not yet approved or cleared by the [place] FDA and has been authorized for detection and/or diagnosis of SARS-CoV-2 by FDA under an Emergency Use Authorization (EUA).  This EUA will remain in effect (meaning this test can be used) for the duration of the COVID-19 declaration under Section 564(b)(1) of the Act, 21 U.S.C. section 360bbb-3(b)(1), unless the authorization is terminated or revoked sooner. Performed at [facility], [place] [address] [place], [place] [address]   Wound or Superficial Culture     Status: None   Collection Time: 03/14/19  6:01 PM   Specimen: Ulcer; Wound  Result Value Ref Range Status   Specimen Description ULCER  Final   Special Requests NONE  Final   Gram Stain NO WBC SEEN RARE GRAM POSITIVE COCCI   Final   Culture   Final    NORMAL SKIN FLORA Performed at [facility], [address], [place], [place] [address]    Report Status 03/17/2019 FINAL  Final     Labs: BNP (last 3 results) No results for input(s): BNP in the last 8760 hours. Basic Metabolic Panel: Recent Labs  Lab 03/17/19 0326 03/18/19 0352 03/19/19 0434 03/20/19 0629 03/23/19 0500  NA 138 136 138 138 137  K 4.4 4.4 4.7 4.9 4.5  CL 112* 110 110 112* 113*  CO2 17* 20* 22 23 19*  GLUCOSE 63* 97 113* 91 84  BUN 22 16 17 13 16   CREATININE 1.87* 1.80* 1.88* 1.82* 1.43*  CALCIUM 8.8* 8.7* 8.8* 8.7* 8.6*  MG 1.8 1.7 1.6* 2.0  --    Liver Function Tests: No results for input(s): AST, ALT, ALKPHOS, BILITOT, PROT, ALBUMIN in the last 168 hours. No results for input(s): LIPASE, AMYLASE in the last 168 hours. No results for  input(s): AMMONIA in the last 168 hours. CBC: Recent Labs  Lab 03/18/19 0352 03/19/19 0434 03/20/19 0629 03/21/19 0808 03/22/19 1104  WBC 7.3 6.6 6.9 8.7 6.3  HGB 10.8* 10.8* 10.4* 9.4* 9.3*  HCT 33.0* 34.2* 33.1* 29.1* 29.8*  MCV 80.5 83.2 83.0 82.0 84.4  PLT 279 276 276 216  185   Cardiac Enzymes: No results for input(s): CKTOTAL, CKMB, CKMBINDEX, TROPONINI in the last 168 hours. BNP: Invalid input(s): POCBNP CBG: Recent Labs  Lab 03/22/19 1223 03/22/19 1749 03/22/19 2126 03/23/19 0607 03/23/19 1205  GLUCAP 99 87 77 73 105*   D-Dimer No results for input(s): DDIMER in the last 72 hours. Hgb A1c No results for input(s): HGBA1C in the last 72 hours. Lipid Profile No results for input(s): CHOL, HDL, LDLCALC, TRIG, CHOLHDL, LDLDIRECT in the last 72 hours. Thyroid function studies No results for input(s): TSH, T4TOTAL, T3FREE, THYROIDAB in the last 72 hours.  Invalid input(s): FREET3 Anemia work up No results for input(s): VITAMINB12, FOLATE, FERRITIN, TIBC, IRON, RETICCTPCT in the last 72 hours. Urinalysis No results found for: COLORURINE, APPEARANCEUR, [place], [place], GLUCOSEU, [place], [place], KETONESUR, PROTEINUR, UROBILINOGEN, NITRITE, LEUKOCYTESUR Sepsis Labs Invalid input(s): PROCALCITONIN,  WBC,  LACTICIDVEN Microbiology Recent Results (from the past 240 hour(s))  SARS Coronavirus 2 (CEPHEID - Performed in [place] hospital lab), Hosp Order     Status: None   Collection Time: 03/14/19  5:31 PM   Specimen: Nasopharyngeal Swab  Result Value Ref Range Status   SARS Coronavirus 2 NEGATIVE NEGATIVE Final    Comment: (NOTE) If result is NEGATIVE SARS-CoV-2 target nucleic acids are NOT DETECTED. The SARS-CoV-2 RNA is generally detectable in upper and lower  respiratory specimens during the acute phase of infection. The lowest  concentration of SARS-CoV-2 viral copies this assay can detect is 250  copies / mL. A negative result does not preclude SARS-CoV-2 infection  and should not be used as the sole basis for treatment or other  patient management decisions.  A negative result may occur with  improper specimen collection / handling, submission of specimen other  than nasopharyngeal swab, presence of viral mutation(s) within the   areas targeted by this assay, and inadequate number of viral copies  (<250 copies / mL). A negative result must be combined with clinical  observations, patient history, and epidemiological information. If result is POSITIVE SARS-CoV-2 target nucleic acids are DETECTED. The SARS-CoV-2 RNA is generally detectable in upper and lower  respiratory specimens dur ing the acute phase of infection.  Positive  results are indicative of active infection with SARS-CoV-2.  Clinical  correlation with patient history and other diagnostic information is  necessary to determine patient infection status.  Positive results do  not rule out bacterial infection or co-infection with other viruses. If result is PRESUMPTIVE POSTIVE SARS-CoV-2 nucleic acids MAY BE PRESENT.   A presumptive positive result was obtained on the submitted specimen  and confirmed on repeat testing.  While 2019 novel coronavirus  (SARS-CoV-2) nucleic acids may be present in the submitted sample  additional confirmatory testing may be necessary for epidemiological  and / or clinical management purposes  to differentiate between  SARS-CoV-2 and other Sarbecovirus currently known to infect humans.  If clinically indicated additional testing with an alternate test  methodology [phone]) is advised. The SARS-CoV-2 RNA is generally  detectable in upper and lower respiratory sp ecimens during the acute  phase of infection. The expected result is Negative. Fact Sheet for Patients:  StrictlyIdeas.no Fact Sheet  for Healthcare Providers: BankingDealers.co.za This test is not yet approved or cleared by the [place] and has been authorized for detection and/or diagnosis of SARS-CoV-2 by FDA under an Emergency Use Authorization (EUA).  This EUA will remain in effect (meaning this test can be used) for the duration of the COVID-19 declaration under Section 564(b)(1) of the Act, 21  U.S.C. section 360bbb-3(b)(1), unless the authorization is terminated or revoked sooner. Performed at [facility], [place] [address], [place], [place] [address]   Wound or Superficial Culture     Status: None   Collection Time: 03/14/19  6:01 PM   Specimen: Ulcer; Wound  Result Value Ref Range Status   Specimen Description ULCER  Final   Special Requests NONE  Final   Gram Stain NO WBC SEEN RARE GRAM POSITIVE COCCI   Final   Culture   Final    NORMAL SKIN FLORA Performed at [facility], [address] [place], [place] [address]    Report Status 03/17/2019 FINAL  Final    Please note: You were cared for by a hospitalist during your hospital stay. Once you are discharged, your primary care physician will handle any further medical issues. Please note that NO REFILLS for any discharge medications will be authorized once you are discharged, as it is imperative that you return to your primary care physician (or establish a relationship with a primary care physician if you do not have one) for your post hospital discharge needs so that they can reassess your need for medications and monitor your lab values.    Time coordinating discharge: 40 minutes  SIGNED:   Shelly Coss, MD  [facility] 03/23/2019, 2:41 PM Pager [phone]  If 7PM-7AM, please contact night-coverage [email or web address] Password [email or web address]

## 2019-03-23 NOTE — Progress Notes (Signed)
Order received to discharge patient.  IV team consulted to remove PICC line.  Discharge instructions, follow up, medications and instructions for their use discussed with patient and neice

## 2019-03-23 NOTE — Progress Notes (Signed)
Patient's niece, Capone Schwinn, does not want pt placed in a nursing home for rehab.  She says if pt cannot go to CIR then she wants him to come home with her.  Please call her at [phone] asap.

## 2019-03-23 NOTE — Discharge Instructions (Signed)

## 2019-03-23 NOTE — Care Management Important Message (Signed)
Important Message  Patient Details  Name: [name] MRN: [id number] Date of Birth: March 30, 1951   Medicare Important Message Given:  Yes     Shelda Altes 03/23/2019, 1:32 PM

## 2019-03-23 NOTE — Progress Notes (Signed)
Physical Therapy Treatment Patient Details Name: [name] MRN: [id number] DOB: 08-Apr-1951 Today's Date: 03/23/2019    History of Present Illness Pt is a 68 y/o male admitted 03/14/19 secondary to worsening RLE wound. 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 Plan is for arteriogram with possible right leg intervention on 7/17. PMH includes dementia, CAD, MI, HTN, a fib, CKD.    PT Comments    Pt very motivated to improve. Pre gait with mod A for stepping fwd/bkwd, pt then asks to walk in the hallway. Pt able to perform gait 10' with RW to doorway, max/total cuing and max A for turning with the RW. Pt then fatigued and requires seated rest before gait 10' back to bed.  Pt pleased with increased gait distance today.    Follow Up Recommendations  CIR     Equipment Recommendations       Recommendations for Other Services       Precautions / Restrictions Precautions Precautions: Fall Other Brace: Prevalon boot Restrictions Weight Bearing Restrictions: No    Mobility  Bed Mobility   Bed Mobility: Supine to Sit     Supine to sit: Supervision Sit to supine: Min assist   General bed mobility comments: assist wtih LEs to get into bed  Transfers   Equipment used: Rolling walker (2 wheeled)   Sit to Stand: Mod assist Stand pivot transfers: Max assist       General transfer comment: mod A to stand frome elevated bed, cues for hand placement.  max A for problem solving turning with RW  Ambulation/Gait Ambulation/Gait assistance: Mod assist Gait Distance (Feet): 10 Feet(x 2) Assistive device: Rolling walker (2 wheeled)       General Gait Details: slow cadence, forward flexed trunk with heavy reliance on UEs, max cuing for RW use when turning, bilat knee flexion when fatigued   Stairs             Wheelchair Mobility    Modified Rankin (Stroke Patients Only)       Balance               Standing balance comment: Heavy reliance on UE support                             Cognition Arousal/Alertness: Awake/alert Behavior During Therapy: WFL for tasks assessed/performed Overall Cognitive Status: History of cognitive impairments - at baseline                                        Exercises      General Comments        Pertinent Vitals/Pain Faces Pain Scale: Hurts even more Pain Location: R foot  Pain Descriptors / Indicators: Grimacing Pain Intervention(s): Monitored during session;Repositioned;Limited activity within patient's tolerance    Home Living                      Prior Function            PT Goals (current goals can now be found in the care plan section) Progress towards PT goals: Progressing toward goals    Frequency    Min 3X/week      PT Plan Current plan remains appropriate    Co-evaluation              AM-PAC PT "6 Clicks" Mobility  Outcome Measure  Help needed turning from your back to your side while in a flat bed without using bedrails?: A Little Help needed moving from lying on your back to sitting on the side of a flat bed without using bedrails?: A Little Help needed moving to and from a bed to a chair (including a wheelchair)?: A Lot Help needed standing up from a chair using your arms (e.g., wheelchair or bedside chair)?: A Lot Help needed to walk in hospital room?: A Lot Help needed climbing 3-5 steps with a railing? : Total 6 Click Score: 13    End of Session Equipment Utilized During Treatment: Gait belt Activity Tolerance: Patient tolerated treatment well Patient left: in bed;with call bell/phone within reach;with bed alarm set Nurse Communication: Mobility status PT Visit Diagnosis: Difficulty in walking, not elsewhere classified (R26.2);Unsteadiness on feet (R26.81);Muscle weakness (generalized) (M62.81);Pain Pain - Right/Left: Right Pain - part of body: Ankle and joints of foot     Time: 4081-4481 PT Time Calculation (min) (ACUTE  ONLY): 26 min  Charges:  $Gait Training: 23-37 mins                    Isabelle Course, PT, DPT   Isabelle Course 03/23/2019, 9:41 AM

## 2019-03-23 NOTE — Progress Notes (Signed)
Inpatient Rehabilitation-Admissions Coordinator   At this time, patient does not meet criteria for medical necessity to warrent a CIR stay. Would recommend SNF. AC will no longer follow. Will contact CM/SW regarding recommendation.   Jhonnie Garner, OTR/L  Rehab Admissions Coordinator  [phone] 03/23/2019 9:27 AM

## 2019-04-13 ENCOUNTER — Ambulatory Visit: Payer: Medicare Other | Admitting: Cardiology

## 2019-04-23 ENCOUNTER — Encounter: Payer: Self-pay | Admitting: Cardiology

## 2019-04-24 ENCOUNTER — Encounter: Payer: Self-pay | Admitting: Cardiology

## 2019-04-24 ENCOUNTER — Other Ambulatory Visit: Payer: Self-pay

## 2019-04-24 ENCOUNTER — Ambulatory Visit (INDEPENDENT_AMBULATORY_CARE_PROVIDER_SITE_OTHER): Payer: Medicare Other | Admitting: Cardiology

## 2019-04-24 ENCOUNTER — Telehealth (HOSPITAL_COMMUNITY): Payer: Self-pay | Admitting: Rehabilitation

## 2019-04-24 VITALS — BP 138/72 | HR 70 | Temp 98.7°F | Ht 74.0 in

## 2019-04-24 DIAGNOSIS — I480 Paroxysmal atrial fibrillation: Secondary | ICD-10-CM | POA: Diagnosis not present

## 2019-04-24 DIAGNOSIS — I2510 Atherosclerotic heart disease of native coronary artery without angina pectoris: Secondary | ICD-10-CM

## 2019-04-24 DIAGNOSIS — I998 Other disorder of circulatory system: Secondary | ICD-10-CM

## 2019-04-24 DIAGNOSIS — I70229 Atherosclerosis of native arteries of extremities with rest pain, unspecified extremity: Secondary | ICD-10-CM

## 2019-04-24 NOTE — Telephone Encounter (Signed)

## 2019-04-24 NOTE — Progress Notes (Signed)
Follow up visit  Subjective:   [name], male    DOB: [birth year], 68 y.o.   MRN: [id number]   Chief Complaint  Patient presents with  . Follow-up  . Coronary Artery Disease  . PAD  . PAF    HPI  68 year old male with coronary artery disease status post CABG, prior strokes, dementia, PAF,  critical limb ischemia with recent angiogram showing b/l SFA occlusion without constitution of a named distal vessel. He was deemed not to be a candidate for any revascularization by Dr. Trula Slade.   Patient is here today with his HCPOA and niece [name]. His baseline functional capacity Is extremely limited with previous strokes dementia. He is wheelchair bound and has a living will stating he does not want any life prolonging measures.    Past Medical History:  Diagnosis Date  . Alcoholism ([place])   . Coronary artery disease   . Dementia ([place])   . Hyperlipidemia   . Hypertension   . Paroxysmal atrial fibrillation (HCC)   . Stroke [facility])      Past Surgical History:  Procedure Laterality Date  . ABDOMINAL AORTOGRAM W/LOWER EXTREMITY Bilateral 03/20/2019   Procedure: ABDOMINAL AORTOGRAM W/LOWER EXTREMITY;  Surgeon: Serafina Mitchell, MD;  Location: [place] [facility];  Service: Cardiovascular;  Laterality: Bilateral;  . heart bypass       Social History   Socioeconomic History  . Marital status: Single    Spouse name: Not on file  . Number of children: 2  . Years of education: Not on file  . Highest education level: Not on file  Occupational History  . Occupation: Retired  Scientific laboratory technician  . Financial resource strain: Not on file  . Food insecurity    Worry: Not on file    Inability: Not on file  . Transportation needs    Medical: Not on file    Non-medical: Not on file  Tobacco Use  . Smoking status: Former Smoker    Years: 50.00    Types: Cigarettes    Quit date: 2019    Years since quitting: 1.6  . Smokeless tobacco: Never Used  Substance and Sexual Activity  .  Alcohol use: Not Currently  . Drug use: Never  . Sexual activity: Not Currently  Lifestyle  . Physical activity    Days per week: Not on file    Minutes per session: Not on file  . Stress: Not on file  Relationships  . Social Herbalist on phone: Not on file    Gets together: Not on file    Attends religious service: Not on file    Active member of club or organization: Not on file    Attends meetings of clubs or organizations: Not on file    Relationship status: Not on file  . Intimate partner violence    Fear of current or ex partner: Not on file    Emotionally abused: Not on file    Physically abused: Not on file    Forced sexual activity: Not on file  Other Topics Concern  . Not on file  Social History Narrative   Pt lives in single story home with his niece and her significant other   Has 2 children   Pt unsure of level of education - knows that he started high school and that he did not finish.   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      Family History  Problem Relation Age of Onset  .  Lung cancer Sister   . Heart disease Brother   . Diabetes Sister   . Clotting disorder Sister   . Heart disease Sister   . Colon cancer Neg Hx   . Esophageal cancer Neg Hx      Current Outpatient Medications on File Prior to Visit  Medication Sig Dispense Refill  . apixaban (ELIQUIS) 5 MG TABS tablet Take 5 mg by mouth 2 (two) times daily with a meal.    . aspirin EC 81 MG tablet Take 81 mg by mouth daily.    [name] atorvastatin (LIPITOR) 40 MG tablet Take 40 mg by mouth at bedtime.     [name] doxycycline (VIBRA-TABS) 100 MG tablet Take 1 tablet (100 mg total) by mouth every 12 (twelve) hours. 76 tablet 0  . folic acid (FOLVITE) 1 MG tablet Take 1 mg by mouth daily.      No current facility-administered medications on file prior to visit.     Cardiovascular studies:  EKG 04/24/2019: Probable sinus rhythm 70 bpm. Nonspecific ST-T changes anteroalteral leads.    Echocardiogram 01/17/2018: Mild concentric LVH.  LVEF 64%.  Normal diastolic function.  No significant valvular abnormality.  No evidence of pulmonary hypertension.  ABI 12/26/17: R ABI 0.92 L ABI 0.90  Nuclear stress test 2016: Impression Exercise Capacity:  Lexiscan with no exercise. BP Response:  Normal blood pressure response. Clinical Symptoms:  There is dyspnea. ECG Impression:  No significant ST segment change suggestive of ischemia. Comparison with Prior Nuclear Study: Compared to 11/25/09, mild ischemia new.   Overall Impression:  Intermediate risk stress nuclear study with a large, severe, partially reversible anterior lateral defect consistent with prior infarct and very mild peri-infarct ischemia. Study intermediate risk due to reduced LV function.   LV Ejection Fraction: 42%.  LV Wall Motion:  Lateral akinesis.  CT head 2016: IMPRESSION: 1. No acute intracranial pathology seen on CT. 2. Mild to moderate cortical volume loss and diffuse small vessel ischemic microangiopathy. 3. Chronic infarcts of the left frontal and left parietal lobes, with associated encephalomalacia. 4. Chronic infarct at the right cerebellar hemisphere. Small chronic infarcts within the basal ganglia bilaterally. 5. Mucosal thickening at the right maxillary sinus.  Recent labs: Results for LIANG, STOLLE (MRN [id number]) as of 04/25/2019 12:50  Ref. Range 03/23/2019 05:00  Sodium Latest Ref Range: 135 - 145 mmol/L 137  Potassium Latest Ref Range: 3.5 - 5.1 mmol/L 4.5  Chloride Latest Ref Range: 98 - 111 mmol/L 113 (H)  CO2 Latest Ref Range: 22 - 32 mmol/L 19 (L)  Glucose Latest Ref Range: 70 - 99 mg/dL 84  BUN Latest Ref Range: 8 - 23 mg/dL 16  Creatinine Latest Ref Range: 0.61 - 1.24 mg/dL 1.43 (H)  Calcium Latest Ref Range: 8.9 - 10.3 mg/dL 8.6 (L)  Anion gap Latest Ref Range: 5 - 15  5  GFR, Est Non African American Latest Ref Range: >60 mL/min 50 (L)  GFR, Est African American Latest Ref  Range: >60 mL/min 58 (L)   Results for JATHANIEL, BRUS (MRN [id number]) as of 04/25/2019 12:50  Ref. Range 03/22/2019 11:04  WBC Latest Ref Range: 4.0 - 10.5 K/uL 6.3  RBC Latest Ref Range: 4.22 - 5.81 MIL/uL 3.53 (L)  Hemoglobin Latest Ref Range: 13.0 - 17.0 g/dL 9.3 (L)  HCT Latest Ref Range: 39.0 - 52.0 % 29.8 (L)  MCV Latest Ref Range: 80.0 - 100.0 fL 84.4  MCH Latest Ref Range: 26.0 - 34.0 pg 26.3  MCHC Latest Ref  Range: 30.0 - 36.0 g/dL 31.2  RDW Latest Ref Range: 11.5 - 15.5 % 16.3 (H)  Platelets Latest Ref Range: 150 - 400 K/uL 185  nRBC Latest Ref Range: 0.0 - 0.2 % 0.0        ROS: Limited due to underlying dementia. No complaints at this time.    Vitals:   04/24/19 1417  BP: 138/72  Pulse: 70  Temp: 98.7 F (37.1 C)  SpO2: 98%    Body mass index is 22.5 kg/m. Filed Weights    Objective:   Physical Exam  Constitutional: He is oriented to person, place, and time. He appears well-developed and well-nourished. No distress.  Wheelchair bound  HENT:  Head: Normocephalic and atraumatic.  Eyes: Pupils are equal, round, and reactive to light. Conjunctivae are normal.  Neck: No JVD present.  Cardiovascular: Normal rate and regular rhythm.  Pulses:      Dorsalis pedis pulses are 0 on the right side and 0 on the left side.       Posterior tibial pulses are 0 on the right side and 0 on the left side.  Ulcer on right lateral heal   Pulmonary/Chest: Effort normal and breath sounds normal. He has no wheezes. He has no rales.  Abdominal: Soft. Bowel sounds are normal. There is no rebound.  Musculoskeletal:        General: No edema.  Lymphadenopathy:    He has no cervical adenopathy.  Neurological: He is alert and oriented to person, place, and time. No cranial nerve deficit.  Skin: Skin is warm and dry.  Psychiatric: He has a normal mood and affect.  Nursing note and vitals reviewed.         Assessment & Recommendations:   68 year old male with coronary artery  disease status post CABG, prior strokes, dementia, PAF,  critical limb ischemia, not amenable to revascularization.  CAD: Continue current medical therapy. Do not recommend any invasive management in future.   PAD: Critical limb ischemia. Not a candidate for revascularization. Continue Aspirin, statin.  PAF: High CHA2DS2Vasc score  Continue eliquis, amiodarone.   Dementia: Poor baseline functional status.patient has advance directive.  PRN f/u.   Nigel Mormon, MD [facility] Pager: [phone] Office: [phone] If no answer Cell [phone]

## 2019-04-25 ENCOUNTER — Encounter: Payer: Self-pay | Admitting: Cardiology

## 2019-04-25 DIAGNOSIS — I998 Other disorder of circulatory system: Secondary | ICD-10-CM | POA: Insufficient documentation

## 2019-04-25 DIAGNOSIS — I70229 Atherosclerosis of native arteries of extremities with rest pain, unspecified extremity: Secondary | ICD-10-CM | POA: Insufficient documentation

## 2019-04-27 ENCOUNTER — Encounter: Payer: Self-pay | Admitting: Surgery

## 2019-04-27 ENCOUNTER — Encounter: Payer: Self-pay | Admitting: *Deleted

## 2019-04-27 ENCOUNTER — Ambulatory Visit (INDEPENDENT_AMBULATORY_CARE_PROVIDER_SITE_OTHER): Payer: Medicare Other | Admitting: Surgery

## 2019-04-27 ENCOUNTER — Other Ambulatory Visit: Payer: Self-pay

## 2019-04-27 ENCOUNTER — Other Ambulatory Visit: Payer: Self-pay | Admitting: *Deleted

## 2019-04-27 VITALS — BP 112/74 | HR 73 | Temp 97.4°F | Resp 18 | Ht 74.0 in | Wt 170.0 lb

## 2019-04-27 DIAGNOSIS — M86171 Other acute osteomyelitis, right ankle and foot: Secondary | ICD-10-CM | POA: Insufficient documentation

## 2019-04-27 DIAGNOSIS — S91301A Unspecified open wound, right foot, initial encounter: Secondary | ICD-10-CM

## 2019-04-27 NOTE — H&P (View-Only) (Signed)
Vascular and Vein Specialist of [facility]  Patient name: [name] MRN: [id number] DOB: 1951/07/04 Sex: male   REASON FOR VISIT:    Follow up ulcer  HISOTRY OF PRESENT ILLNESS:    [name] is a 68 y.o. male that I saw in the hospital for a right foot wound.  He underwent angiography on 03/20/2019 and did not have options for revascularization.  He is back today for wound check.  The patient suffers from atrial fibrillation on Eliquis.  He has a history of CAD, status post MI with history of CABG.  Management hypertension hyperlipidemia and has a history of moderate dementia.  His ABIs were 0.33 on the right and 0.5 on the left.  MRI showed osteomyelitis of the right fifth metatarsal.  The patient does have a severe foot drop on the left and states that he is ambulatory.  He does have history of chronic renal insufficiency.   PAST MEDICAL HISTORY:   Past Medical History:  Diagnosis Date  . Alcoholism ([place])   . Coronary artery disease   . Dementia ([place])   . Hyperlipidemia   . Hypertension   . Paroxysmal atrial fibrillation (HCC)   . Stroke [facility])      FAMILY HISTORY:   Family History  Problem Relation Age of Onset  . Lung cancer Sister   . Heart disease Brother   . Diabetes Sister   . Clotting disorder Sister   . Heart disease Sister   . Colon cancer Neg Hx   . Esophageal cancer Neg Hx     SOCIAL HISTORY:   Social History   Tobacco Use  . Smoking status: Former Smoker    Years: 50.00    Types: Cigarettes    Quit date: 2019    Years since quitting: 1.6  . Smokeless tobacco: Never Used  Substance Use Topics  . Alcohol use: Not Currently     ALLERGIES:   No Known Allergies   CURRENT MEDICATIONS:   Current Outpatient Medications  Medication Sig Dispense Refill  . amiodarone (PACERONE) 200 MG tablet Take 200 mg by mouth 2 (two) times daily.    [name] apixaban (ELIQUIS) 5 MG TABS tablet Take 5 mg by mouth 2 (two)  times daily with a meal.    . aspirin EC 81 MG tablet Take 81 mg by mouth daily.    [name] atorvastatin (LIPITOR) 40 MG tablet Take 40 mg by mouth at bedtime.     [name] doxycycline (VIBRA-TABS) 100 MG tablet Take 1 tablet (100 mg total) by mouth every 12 (twelve) hours. 76 tablet 0  . folic acid (FOLVITE) 1 MG tablet Take 1 mg by mouth daily.      No current facility-administered medications for this visit.     REVIEW OF SYSTEMS:   [X]  denotes positive finding, [ ]  denotes negative finding Cardiac  Comments:  Chest pain or chest pressure:    Shortness of breath upon exertion:    Short of breath when lying flat:    Irregular heart rhythm:        Vascular    Pain in calf, thigh, or hip brought on by ambulation:    Pain in feet at night that wakes you up from your sleep:  x   Blood clot in your veins:    Leg swelling:  x       Pulmonary    Oxygen at home:    Productive cough:     Wheezing:  Neurologic    Sudden weakness in arms or legs:     Sudden numbness in arms or legs:     Sudden onset of difficulty speaking or slurred speech:    Temporary loss of vision in one eye:     Problems with dizziness:         Gastrointestinal    Blood in stool:     Vomited blood:         Genitourinary    Burning when urinating:     Blood in urine:        Psychiatric    Major depression:         Hematologic    Bleeding problems:    Problems with blood clotting too easily:        Skin    Rashes or ulcers: x       Constitutional    Fever or chills:      PHYSICAL EXAM:   Vitals:   04/27/19 0858  BP: 112/74  Pulse: 73  Resp: 18  Temp: (!) 97.4 F (36.3 C)  SpO2: 99%  Weight: 170 lb (77.1 kg)  Height: 6\' 2"  (1.88 m)    GENERAL: The patient is a well-nourished male, in no acute distress. The vital signs are documented above. CARDIAC: There is a regular rate and rhythm.  VASCULAR: Nonpalpable pedal pulses PULMONARY: Non-labored respirations ABDOMEN: Soft and non-tender with  normal pitched bowel sounds.  MUSCULOSKELETAL: There are no major deformities or cyanosis. NEUROLOGIC: No focal weakness or paresthesias are detected. SKIN: Right foot lateral ulcer at metatarsal head PSYCHIATRIC: The patient has a normal affect.  STUDIES:   None  MEDICAL ISSUES:   Nonhealing right foot ulcer: The patient does not have options for revascularization.  We have tried to treat him conservatively with antibiotics however this has failed.  Our only option now is amputation.  I discussed below-knee versus above-knee.  The patient would prefer an above-knee amputation.  I have scheduled this for Friday, September 4.  He will stop his Eliquis prior to his procedure.  The risks and benefits as well as the details of the operation were discussed with the patient and family.  All questions were answered.    Leia Alf, MD, FACS [facility]ascular and Vein Specialists of [facility] [phone] Pager [phone]

## 2019-04-27 NOTE — Progress Notes (Signed)
Vascular and Vein Specialist of [facility]  Patient name: [name] MRN: [id number] DOB: 06-13-1951 Sex: male   REASON FOR VISIT:    Follow up ulcer  HISOTRY OF PRESENT ILLNESS:    [name] is a 68 y.o. male that I saw in the hospital for a right foot wound.  He underwent angiography on 03/20/2019 and did not have options for revascularization.  He is back today for wound check.  The patient suffers from atrial fibrillation on Eliquis.  He has a history of CAD, status post MI with history of CABG.  Management hypertension hyperlipidemia and has a history of moderate dementia.  His ABIs were 0.33 on the right and 0.5 on the left.  MRI showed osteomyelitis of the right fifth metatarsal.  The patient does have a severe foot drop on the left and states that he is ambulatory.  He does have history of chronic renal insufficiency.   PAST MEDICAL HISTORY:   Past Medical History:  Diagnosis Date  . Alcoholism ([place])   . Coronary artery disease   . Dementia ([place])   . Hyperlipidemia   . Hypertension   . Paroxysmal atrial fibrillation (HCC)   . Stroke [facility])      FAMILY HISTORY:   Family History  Problem Relation Age of Onset  . Lung cancer Sister   . Heart disease Brother   . Diabetes Sister   . Clotting disorder Sister   . Heart disease Sister   . Colon cancer Neg Hx   . Esophageal cancer Neg Hx     SOCIAL HISTORY:   Social History   Tobacco Use  . Smoking status: Former Smoker    Years: 50.00    Types: Cigarettes    Quit date: 2019    Years since quitting: 1.6  . Smokeless tobacco: Never Used  Substance Use Topics  . Alcohol use: Not Currently     ALLERGIES:   No Known Allergies   CURRENT MEDICATIONS:   Current Outpatient Medications  Medication Sig Dispense Refill  . amiodarone (PACERONE) 200 MG tablet Take 200 mg by mouth 2 (two) times daily.    [name] apixaban (ELIQUIS) 5 MG TABS tablet Take 5 mg by mouth 2 (two)  times daily with a meal.    . aspirin EC 81 MG tablet Take 81 mg by mouth daily.    [name] atorvastatin (LIPITOR) 40 MG tablet Take 40 mg by mouth at bedtime.     [name] doxycycline (VIBRA-TABS) 100 MG tablet Take 1 tablet (100 mg total) by mouth every 12 (twelve) hours. 76 tablet 0  . folic acid (FOLVITE) 1 MG tablet Take 1 mg by mouth daily.      No current facility-administered medications for this visit.     REVIEW OF SYSTEMS:   [X]  denotes positive finding, [ ]  denotes negative finding Cardiac  Comments:  Chest pain or chest pressure:    Shortness of breath upon exertion:    Short of breath when lying flat:    Irregular heart rhythm:        Vascular    Pain in calf, thigh, or hip brought on by ambulation:    Pain in feet at night that wakes you up from your sleep:  x   Blood clot in your veins:    Leg swelling:  x       Pulmonary    Oxygen at home:    Productive cough:     Wheezing:  Neurologic    Sudden weakness in arms or legs:     Sudden numbness in arms or legs:     Sudden onset of difficulty speaking or slurred speech:    Temporary loss of vision in one eye:     Problems with dizziness:         Gastrointestinal    Blood in stool:     Vomited blood:         Genitourinary    Burning when urinating:     Blood in urine:        Psychiatric    Major depression:         Hematologic    Bleeding problems:    Problems with blood clotting too easily:        Skin    Rashes or ulcers: x       Constitutional    Fever or chills:      PHYSICAL EXAM:   Vitals:   04/27/19 0858  BP: 112/74  Pulse: 73  Resp: 18  Temp: (!) 97.4 F (36.3 C)  SpO2: 99%  Weight: 170 lb (77.1 kg)  Height: 6\' 2"  (1.88 m)    GENERAL: The patient is a well-nourished male, in no acute distress. The vital signs are documented above. CARDIAC: There is a regular rate and rhythm.  VASCULAR: Nonpalpable pedal pulses PULMONARY: Non-labored respirations ABDOMEN: Soft and non-tender with  normal pitched bowel sounds.  MUSCULOSKELETAL: There are no major deformities or cyanosis. NEUROLOGIC: No focal weakness or paresthesias are detected. SKIN: Right foot lateral ulcer at metatarsal head PSYCHIATRIC: The patient has a normal affect.  STUDIES:   None  MEDICAL ISSUES:   Nonhealing right foot ulcer: The patient does not have options for revascularization.  We have tried to treat him conservatively with antibiotics however this has failed.  Our only option now is amputation.  I discussed below-knee versus above-knee.  The patient would prefer an above-knee amputation.  I have scheduled this for Friday, September 4.  He will stop his Eliquis prior to his procedure.  The risks and benefits as well as the details of the operation were discussed with the patient and family.  All questions were answered.    Leia Alf, MD, FACS Vascular and Vein Specialists of [facility] [phone] Pager [phone]

## 2019-05-01 ENCOUNTER — Other Ambulatory Visit: Payer: Self-pay | Admitting: Nephrology

## 2019-05-01 DIAGNOSIS — N183 Chronic kidney disease, stage 3 unspecified: Secondary | ICD-10-CM

## 2019-05-05 ENCOUNTER — Other Ambulatory Visit (HOSPITAL_COMMUNITY)
Admission: RE | Admit: 2019-05-05 | Discharge: 2019-05-05 | Disposition: A | Discharge status: Home / Self Care | Payer: Medicare Other | Source: Ambulatory Visit | Attending: Surgery | Admitting: Surgery

## 2019-05-05 DIAGNOSIS — Z20828 Contact with and (suspected) exposure to other viral communicable diseases: Secondary | ICD-10-CM | POA: Insufficient documentation

## 2019-05-05 DIAGNOSIS — Z01812 Encounter for preprocedural laboratory examination: Secondary | ICD-10-CM | POA: Insufficient documentation

## 2019-05-05 LAB — SARS CORONAVIRUS 2 (TAT 6-24 HRS): SARS Coronavirus 2: NEGATIVE

## 2019-05-07 ENCOUNTER — Other Ambulatory Visit: Payer: Self-pay

## 2019-05-07 ENCOUNTER — Encounter (HOSPITAL_COMMUNITY): Payer: Self-pay | Admitting: *Deleted

## 2019-05-07 ENCOUNTER — Other Ambulatory Visit: Payer: Self-pay | Admitting: Nephrology

## 2019-05-07 DIAGNOSIS — N183 Chronic kidney disease, stage 3 unspecified: Secondary | ICD-10-CM

## 2019-05-07 NOTE — Anesthesia Preprocedure Evaluation (Addendum)
Anesthesia Evaluation  Patient identified by MRN, date of birth, ID band Patient awake    Reviewed: Allergy & Precautions, NPO status , Patient's Chart, lab work & pertinent test results  Airway Mallampati: II  TM Distance: >3 FB Neck ROM: Full    Dental  (+) Poor Dentition, Missing, Edentulous Upper   Pulmonary former smoker,    Pulmonary exam normal breath sounds clear to auscultation       Cardiovascular hypertension, + CAD, + CABG and + Peripheral Vascular Disease  Normal cardiovascular exam+ dysrhythmias Atrial Fibrillation  Rhythm:Regular Rate:Normal     Neuro/Psych PSYCHIATRIC DISORDERS Dementia Left foot drop CVA, Residual Symptoms    GI/Hepatic negative GI ROS, (+)     substance abuse  ,   Endo/Other  negative endocrine ROS  Renal/GU negative Renal ROS     Musculoskeletal negative musculoskeletal ROS (+)   Abdominal   Peds  Hematology HLD   Anesthesia Other Findings NON-VIABLE TISSUE RIGHT LOWER EXTREMITY  Reproductive/Obstetrics                           Anesthesia Physical Anesthesia Plan  ASA: III  Anesthesia Plan: General   Post-op Pain Management:    Induction: Intravenous  PONV Risk Score and Plan: 2 and Ondansetron, Dexamethasone, Midazolam and Treatment may vary due to age or medical condition  Airway Management Planned: Oral ETT  Additional Equipment:   Intra-op Plan:   Post-operative Plan: Extubation in OR  Informed Consent: I have reviewed the patients History and Physical, chart, labs and discussed the procedure including the risks, benefits and alternatives for the proposed anesthesia with the patient or authorized representative who has indicated his/her understanding and acceptance.     Dental advisory given  Plan Discussed with: CRNA  Anesthesia Plan Comments: (68 year old male with coronary artery disease status post CABG, prior strokes,  dementia, PAF,  critical limb ischemia with recent angiogram showing b/l SFA occlusion without constitution of a named distal vessel. He was deemed not to be a candidate for any revascularization by Dr. Trula Slade.   Patient was seen by cardiologist Dr. Virgina Jock on 04/24/19. Per his note "His baseline functional capacity Is extremely limited with previous strokes dementia. He is wheelchair bound and has a living will stating he does not want any life prolonging measures." At this point Dr. Virgina Jock has recommended PRN followup.  Per Dr. Rayna Sexton ntoe 04/27/19 "Nonhealing right foot ulcer: The patient does not have options for revascularization.  We have tried to treat him conservatively with antibiotics however this has failed.  Our only option now is amputation.  I discussed below-knee versus above-knee.  The patient would prefer an above-knee amputation."  EKG 04/24/2019: Probable sinus rhythm 70 bpm. Nonspecific ST-T changes anteroalteral leads.   Echocardiogram 01/17/2018: Mild concentric LVH. LVEF 64%. Normal diastolic function. No significant valvular abnormality. No evidence of pulmonary hypertension.  ABI 12/26/17: R ABI 0.92 L ABI 0.90  Nuclear stress test 2016: Impression Exercise Capacity: Lexiscan with no exercise. BP Response: Normal blood pressure response. Clinical Symptoms: There is dyspnea. ECG Impression: No significant ST segment change suggestive of ischemia. Comparison with Prior Nuclear Study: Compared to 11/25/09, mild ischemia new.   Overall Impression: Intermediate risk stress nuclear study with a large, severe, partially reversible anterior lateral defect consistent with prior infarct and very mild peri-infarct ischemia. Study intermediate risk due to reduced LV function.   LV Ejection Fraction: 42%. LV Wall Motion: Lateral akinesis.  )  Anesthesia Quick Evaluation  

## 2019-05-07 NOTE — Progress Notes (Signed)
SDW-pre-op call completed by pt niece, [name]. Niece denies that pt C/O SOB and chest pain. Niece stated that pt is under the care of Dr. Virgina Jock, Cardiology Niece stated that pt had a acradiac cath > 10 years ago. Niece stated that pt last dose of Eliquis was " Monday." Niece made aware to have pt stop taking vitamins, fish oil and herbal medications. Do not take any NSAIDs ie: Ibuprofen, Advil, Naproxen (Aleve), Motrin, BC and Goody Powder. Niece made aware to check pt CBG every 2 hours prior to arrival to hospital on DOS. Pt made aware to treat a CBG < 70 with 4 ounces of apple  juice, wait 15 minutes after intervention to recheck BG, if BG remains < 70, call Short Stay unit to speak with a nurse. Niece verbalized understanding of all pre-op instructions. Pt chart forwarded to PA, Anesthesiology, for review.

## 2019-05-08 ENCOUNTER — Inpatient Hospital Stay (HOSPITAL_COMMUNITY)
Admission: RE | Admit: 2019-05-08 | Discharge: 2019-05-16 | DRG: 240 | Disposition: A | Discharge status: Home Health | Payer: Medicare Other | Attending: Surgery | Admitting: Surgery

## 2019-05-08 ENCOUNTER — Encounter (HOSPITAL_COMMUNITY)
Admission: RE | Disposition: A | Discharge status: Home Health | Payer: Medicare Other | Source: Home / Self Care | Attending: Surgery

## 2019-05-08 ENCOUNTER — Inpatient Hospital Stay (HOSPITAL_COMMUNITY): Payer: Medicare Other | Admitting: Physician Assistant

## 2019-05-08 ENCOUNTER — Encounter (HOSPITAL_COMMUNITY): Payer: Self-pay | Admitting: Certified Registered Nurse Anesthetist

## 2019-05-08 ENCOUNTER — Other Ambulatory Visit: Payer: Self-pay

## 2019-05-08 DIAGNOSIS — Z20828 Contact with and (suspected) exposure to other viral communicable diseases: Secondary | ICD-10-CM | POA: Diagnosis present

## 2019-05-08 DIAGNOSIS — Z7901 Long term (current) use of anticoagulants: Secondary | ICD-10-CM | POA: Diagnosis not present

## 2019-05-08 DIAGNOSIS — Z23 Encounter for immunization: Secondary | ICD-10-CM

## 2019-05-08 DIAGNOSIS — I70234 Atherosclerosis of native arteries of right leg with ulceration of heel and midfoot: Secondary | ICD-10-CM | POA: Diagnosis not present

## 2019-05-08 DIAGNOSIS — Z833 Family history of diabetes mellitus: Secondary | ICD-10-CM | POA: Diagnosis not present

## 2019-05-08 DIAGNOSIS — Z7982 Long term (current) use of aspirin: Secondary | ICD-10-CM | POA: Diagnosis not present

## 2019-05-08 DIAGNOSIS — F0390 Unspecified dementia without behavioral disturbance: Secondary | ICD-10-CM | POA: Diagnosis present

## 2019-05-08 DIAGNOSIS — F1020 Alcohol dependence, uncomplicated: Secondary | ICD-10-CM | POA: Diagnosis present

## 2019-05-08 DIAGNOSIS — E785 Hyperlipidemia, unspecified: Secondary | ICD-10-CM | POA: Diagnosis present

## 2019-05-08 DIAGNOSIS — L97516 Non-pressure chronic ulcer of other part of right foot with bone involvement without evidence of necrosis: Secondary | ICD-10-CM | POA: Diagnosis present

## 2019-05-08 DIAGNOSIS — M21372 Foot drop, left foot: Secondary | ICD-10-CM | POA: Diagnosis present

## 2019-05-08 DIAGNOSIS — I252 Old myocardial infarction: Secondary | ICD-10-CM | POA: Diagnosis not present

## 2019-05-08 DIAGNOSIS — I129 Hypertensive chronic kidney disease with stage 1 through stage 4 chronic kidney disease, or unspecified chronic kidney disease: Secondary | ICD-10-CM | POA: Diagnosis present

## 2019-05-08 DIAGNOSIS — I2510 Atherosclerotic heart disease of native coronary artery without angina pectoris: Secondary | ICD-10-CM | POA: Diagnosis present

## 2019-05-08 DIAGNOSIS — Z87891 Personal history of nicotine dependence: Secondary | ICD-10-CM | POA: Diagnosis not present

## 2019-05-08 DIAGNOSIS — E7800 Pure hypercholesterolemia, unspecified: Secondary | ICD-10-CM | POA: Diagnosis present

## 2019-05-08 DIAGNOSIS — I69398 Other sequelae of cerebral infarction: Secondary | ICD-10-CM | POA: Diagnosis not present

## 2019-05-08 DIAGNOSIS — N183 Chronic kidney disease, stage 3 (moderate): Secondary | ICD-10-CM | POA: Diagnosis present

## 2019-05-08 DIAGNOSIS — I70235 Atherosclerosis of native arteries of right leg with ulceration of other part of foot: Secondary | ICD-10-CM | POA: Diagnosis present

## 2019-05-08 DIAGNOSIS — Z79899 Other long term (current) drug therapy: Secondary | ICD-10-CM | POA: Diagnosis not present

## 2019-05-08 DIAGNOSIS — M869 Osteomyelitis, unspecified: Secondary | ICD-10-CM | POA: Diagnosis present

## 2019-05-08 DIAGNOSIS — Z8249 Family history of ischemic heart disease and other diseases of the circulatory system: Secondary | ICD-10-CM

## 2019-05-08 DIAGNOSIS — Z951 Presence of aortocoronary bypass graft: Secondary | ICD-10-CM

## 2019-05-08 DIAGNOSIS — Z832 Family history of diseases of the blood and blood-forming organs and certain disorders involving the immune mechanism: Secondary | ICD-10-CM

## 2019-05-08 DIAGNOSIS — I480 Paroxysmal atrial fibrillation: Secondary | ICD-10-CM | POA: Diagnosis present

## 2019-05-08 DIAGNOSIS — L97519 Non-pressure chronic ulcer of other part of right foot with unspecified severity: Secondary | ICD-10-CM | POA: Diagnosis present

## 2019-05-08 DIAGNOSIS — L97509 Non-pressure chronic ulcer of other part of unspecified foot with unspecified severity: Secondary | ICD-10-CM | POA: Diagnosis present

## 2019-05-08 HISTORY — DX: Prediabetes: R73.03

## 2019-05-08 HISTORY — PX: AMPUTATION: SHX166

## 2019-05-08 HISTORY — DX: Other complications of anesthesia, initial encounter: T88.59XA

## 2019-05-08 LAB — GLUCOSE, CAPILLARY
Glucose-Capillary: 73 mg/dL (ref 70–99)
Glucose-Capillary: 76 mg/dL (ref 70–99)
Glucose-Capillary: 88 mg/dL (ref 70–99)
Glucose-Capillary: 182 mg/dL — ABNORMAL HIGH (ref 70–99)

## 2019-05-08 LAB — CBC
HCT: 41.2 % (ref 39.0–52.0)
Hemoglobin: 13.3 g/dL (ref 13.0–17.0)
MCH: 26.2 pg (ref 26.0–34.0)
MCHC: 32.3 g/dL (ref 30.0–36.0)
MCV: 81.3 fL (ref 80.0–100.0)
Platelets: 223 10*3/uL (ref 150–400)
RBC: 5.07 MIL/uL (ref 4.22–5.81)
RDW: 17 % — ABNORMAL HIGH (ref 11.5–15.5)
WBC: 6.1 10*3/uL (ref 4.0–10.5)
nRBC: 0 % (ref 0.0–0.2)

## 2019-05-08 LAB — BASIC METABOLIC PANEL
Anion gap: 10 (ref 5–15)
BUN: 16 mg/dL (ref 8–23)
CO2: 21 mmol/L — ABNORMAL LOW (ref 22–32)
Calcium: 9.1 mg/dL (ref 8.9–10.3)
Chloride: 107 mmol/L (ref 98–111)
Creatinine, Ser: 1.61 mg/dL — ABNORMAL HIGH (ref 0.61–1.24)
GFR calc Af Amer: 51 mL/min — ABNORMAL LOW (ref >60)
GFR calc non Af Amer: 44 mL/min — ABNORMAL LOW (ref >60)
Glucose, Bld: 93 mg/dL (ref 70–99)
Potassium: 4 mmol/L (ref 3.5–5.1)
Sodium: 138 mmol/L (ref 135–145)

## 2019-05-08 LAB — PROTIME-INR
INR: 1 (ref 0.8–1.2)
Prothrombin Time: 13.4 seconds (ref 11.4–15.2)

## 2019-05-08 SURGERY — AMPUTATION, ABOVE KNEE
Anesthesia: General | Site: Knee | Laterality: Right

## 2019-05-08 MED ORDER — 0.9 % SODIUM CHLORIDE (POUR BTL) OPTIME
TOPICAL | Status: DC | PRN
  Administered 2019-05-08: 1000 mL

## 2019-05-08 MED ORDER — MIDAZOLAM HCL 2 MG/2ML IJ SOLN
INTRAMUSCULAR | Status: AC
  Filled 2019-05-08: qty 2

## 2019-05-08 MED ORDER — ROCURONIUM BROMIDE 10 MG/ML (PF) SYRINGE
PREFILLED_SYRINGE | INTRAVENOUS | Status: DC | PRN
  Administered 2019-05-08: 40 mg via INTRAVENOUS

## 2019-05-08 MED ORDER — ATORVASTATIN CALCIUM 40 MG PO TABS
40.0000 mg | ORAL_TABLET | Freq: Every day | ORAL | Status: DC
  Administered 2019-05-08 – 2019-05-15 (×8): 40 mg via ORAL
  Filled 2019-05-08 (×8): qty 1

## 2019-05-08 MED ORDER — ACETAMINOPHEN 10 MG/ML IV SOLN: 1000.0000 mg | Freq: Once | INTRAVENOUS | Status: DC | PRN

## 2019-05-08 MED ORDER — LIDOCAINE 2% (20 MG/ML) 5 ML SYRINGE
INTRAMUSCULAR | Status: DC | PRN
  Administered 2019-05-08: 60 mg via INTRAVENOUS

## 2019-05-08 MED ORDER — ASPIRIN EC 81 MG PO TBEC
81.0000 mg | DELAYED_RELEASE_TABLET | Freq: Every day | ORAL | Status: DC
  Administered 2019-05-09 – 2019-05-16 (×8): 81 mg via ORAL
  Filled 2019-05-08 (×8): qty 1

## 2019-05-08 MED ORDER — ACETAMINOPHEN 10 MG/ML IV SOLN
1000.0000 mg | Freq: Once | INTRAVENOUS | Status: AC
  Administered 2019-05-08: 1000 mg via INTRAVENOUS
  Filled 2019-05-08: qty 100

## 2019-05-08 MED ORDER — MAGNESIUM SULFATE 2 GM/50ML IV SOLN: 2.0000 g | Freq: Every day | INTRAVENOUS | Status: DC | PRN

## 2019-05-08 MED ORDER — ALUM & MAG HYDROXIDE-SIMETH 200-200-20 MG/5ML PO SUSP: 15.0000 mL | ORAL | Status: DC | PRN

## 2019-05-08 MED ORDER — PROPOFOL 10 MG/ML IV BOLUS
INTRAVENOUS | Status: AC
  Filled 2019-05-08: qty 20

## 2019-05-08 MED ORDER — CHLORHEXIDINE GLUCONATE 4 % EX LIQD: 60.0000 mL | Freq: Once | CUTANEOUS | Status: DC

## 2019-05-08 MED ORDER — LABETALOL HCL 5 MG/ML IV SOLN
10.0000 mg | INTRAVENOUS | Status: DC | PRN
  Filled 2019-05-08: qty 4

## 2019-05-08 MED ORDER — ONDANSETRON HCL 4 MG/2ML IJ SOLN
INTRAMUSCULAR | Status: AC
  Filled 2019-05-08: qty 2

## 2019-05-08 MED ORDER — SUGAMMADEX SODIUM 200 MG/2ML IV SOLN
INTRAVENOUS | Status: DC | PRN
  Administered 2019-05-08: 200 mg via INTRAVENOUS

## 2019-05-08 MED ORDER — DEXAMETHASONE SODIUM PHOSPHATE 10 MG/ML IJ SOLN
INTRAMUSCULAR | Status: AC
  Filled 2019-05-08: qty 1

## 2019-05-08 MED ORDER — PANTOPRAZOLE SODIUM 40 MG PO TBEC
40.0000 mg | DELAYED_RELEASE_TABLET | Freq: Every day | ORAL | Status: DC
  Administered 2019-05-08 – 2019-05-16 (×9): 40 mg via ORAL
  Filled 2019-05-08 (×9): qty 1

## 2019-05-08 MED ORDER — ACETAMINOPHEN 325 MG PO TABS: 325.0000 mg | ORAL_TABLET | ORAL | Status: DC | PRN

## 2019-05-08 MED ORDER — PHENOL 1.4 % MT LIQD: 1.0000 | OROMUCOSAL | Status: DC | PRN

## 2019-05-08 MED ORDER — HYDRALAZINE HCL 20 MG/ML IJ SOLN
5.0000 mg | INTRAMUSCULAR | Status: DC | PRN
  Filled 2019-05-08: qty 0.25

## 2019-05-08 MED ORDER — ACETAMINOPHEN 325 MG RE SUPP: 325.0000 mg | RECTAL | Status: DC | PRN

## 2019-05-08 MED ORDER — OXYCODONE-ACETAMINOPHEN 5-325 MG PO TABS
1.0000 | ORAL_TABLET | ORAL | Status: DC | PRN
  Administered 2019-05-09: 1 via ORAL
  Administered 2019-05-09 – 2019-05-13 (×5): 2 via ORAL
  Filled 2019-05-08: qty 2
  Filled 2019-05-08: qty 1
  Filled 2019-05-08 (×5): qty 2

## 2019-05-08 MED ORDER — FENTANYL CITRATE (PF) 100 MCG/2ML IJ SOLN: 25.0000 ug | INTRAMUSCULAR | Status: DC | PRN

## 2019-05-08 MED ORDER — ONDANSETRON HCL 4 MG/2ML IJ SOLN: 4.0000 mg | Freq: Once | INTRAMUSCULAR | Status: DC | PRN

## 2019-05-08 MED ORDER — PHENYLEPHRINE 40 MCG/ML (10ML) SYRINGE FOR IV PUSH (FOR BLOOD PRESSURE SUPPORT)
PREFILLED_SYRINGE | INTRAVENOUS | Status: DC | PRN
  Administered 2019-05-08 (×2): 80 ug via INTRAVENOUS

## 2019-05-08 MED ORDER — SODIUM CHLORIDE 0.9 % IV SOLN
INTRAVENOUS | Status: DC
  Administered 2019-05-08: 11:00:00 via INTRAVENOUS

## 2019-05-08 MED ORDER — PROPOFOL 10 MG/ML IV BOLUS
INTRAVENOUS | Status: DC | PRN
  Administered 2019-05-08: 150 mg via INTRAVENOUS

## 2019-05-08 MED ORDER — KETOROLAC TROMETHAMINE 15 MG/ML IJ SOLN: 15.0000 mg | Freq: Once | INTRAMUSCULAR | Status: DC | PRN

## 2019-05-08 MED ORDER — DOCUSATE SODIUM 100 MG PO CAPS
100.0000 mg | ORAL_CAPSULE | Freq: Every day | ORAL | Status: DC
  Administered 2019-05-09 – 2019-05-16 (×8): 100 mg via ORAL
  Filled 2019-05-08 (×8): qty 1

## 2019-05-08 MED ORDER — AMIODARONE HCL 200 MG PO TABS
200.0000 mg | ORAL_TABLET | Freq: Two times a day (BID) | ORAL | Status: DC
  Administered 2019-05-08 – 2019-05-16 (×16): 200 mg via ORAL
  Filled 2019-05-08 (×16): qty 1

## 2019-05-08 MED ORDER — FENTANYL CITRATE (PF) 250 MCG/5ML IJ SOLN
INTRAMUSCULAR | Status: AC
  Filled 2019-05-08: qty 5

## 2019-05-08 MED ORDER — DEXAMETHASONE SODIUM PHOSPHATE 10 MG/ML IJ SOLN
INTRAMUSCULAR | Status: DC | PRN
  Administered 2019-05-08: 10 mg via INTRAVENOUS

## 2019-05-08 MED ORDER — ACETAMINOPHEN 10 MG/ML IV SOLN
INTRAVENOUS | Status: AC
  Filled 2019-05-08: qty 100

## 2019-05-08 MED ORDER — SODIUM CHLORIDE 0.9 % IV SOLN
INTRAVENOUS | Status: AC
  Administered 2019-05-08: 19:00:00 via INTRAVENOUS

## 2019-05-08 MED ORDER — POLYETHYLENE GLYCOL 3350 17 G PO PACK
17.0000 g | PACK | Freq: Every day | ORAL | Status: DC | PRN
  Administered 2019-05-15: 17 g via ORAL
  Filled 2019-05-08: qty 1

## 2019-05-08 MED ORDER — FENTANYL CITRATE (PF) 100 MCG/2ML IJ SOLN
INTRAMUSCULAR | Status: DC | PRN
  Administered 2019-05-08: 50 ug via INTRAVENOUS

## 2019-05-08 MED ORDER — ONDANSETRON HCL 4 MG/2ML IJ SOLN
INTRAMUSCULAR | Status: DC | PRN
  Administered 2019-05-08: 4 mg via INTRAVENOUS

## 2019-05-08 MED ORDER — APIXABAN 5 MG PO TABS
5.0000 mg | ORAL_TABLET | Freq: Two times a day (BID) | ORAL | Status: DC
  Administered 2019-05-09 – 2019-05-16 (×15): 5 mg via ORAL
  Filled 2019-05-08 (×15): qty 1

## 2019-05-08 MED ORDER — MORPHINE SULFATE (PF) 2 MG/ML IV SOLN: 2.0000 mg | INTRAVENOUS | Status: DC | PRN

## 2019-05-08 MED ORDER — GUAIFENESIN-DM 100-10 MG/5ML PO SYRP: 15.0000 mL | ORAL_SOLUTION | ORAL | Status: DC | PRN

## 2019-05-08 MED ORDER — CEFAZOLIN SODIUM-DEXTROSE 2-4 GM/100ML-% IV SOLN
2.0000 g | INTRAVENOUS | Status: AC
  Administered 2019-05-08: 2 g via INTRAVENOUS
  Filled 2019-05-08: qty 100

## 2019-05-08 MED ORDER — POTASSIUM CHLORIDE CRYS ER 20 MEQ PO TBCR: 20.0000 meq | EXTENDED_RELEASE_TABLET | Freq: Every day | ORAL | Status: DC | PRN

## 2019-05-08 MED ORDER — METOPROLOL TARTRATE 5 MG/5ML IV SOLN
2.0000 mg | INTRAVENOUS | Status: DC | PRN
  Filled 2019-05-08: qty 5

## 2019-05-08 MED ORDER — CEFAZOLIN SODIUM-DEXTROSE 2-4 GM/100ML-% IV SOLN
2.0000 g | Freq: Three times a day (TID) | INTRAVENOUS | Status: AC
  Administered 2019-05-08 – 2019-05-09 (×2): 2 g via INTRAVENOUS
  Filled 2019-05-08 (×2): qty 100

## 2019-05-08 MED ORDER — ONDANSETRON HCL 4 MG/2ML IJ SOLN: 4.0000 mg | Freq: Four times a day (QID) | INTRAMUSCULAR | Status: DC | PRN

## 2019-05-08 SURGICAL SUPPLY — 54 items
BLADE SAW GIGLI 510 (BLADE) ×2 IMPLANT
BNDG COHESIVE 6X5 TAN STRL LF (GAUZE/BANDAGES/DRESSINGS) ×2 IMPLANT
BNDG ELASTIC 4X5.8 VLCR STR LF (GAUZE/BANDAGES/DRESSINGS) ×3 IMPLANT
BNDG ELASTIC 6X5.8 VLCR STR LF (GAUZE/BANDAGES/DRESSINGS) ×2 IMPLANT
BNDG GAUZE ELAST 4 BULKY (GAUZE/BANDAGES/DRESSINGS) ×3 IMPLANT
CANISTER SUCT 3000ML PPV (MISCELLANEOUS) ×2 IMPLANT
CLIP VESOCCLUDE MED 6/CT (CLIP) ×2 IMPLANT
COVER SURGICAL LIGHT HANDLE (MISCELLANEOUS) ×2 IMPLANT
COVER WAND RF STERILE (DRAPES) ×1 IMPLANT
DRAIN CHANNEL 19F RND (DRAIN) IMPLANT
DRAPE HALF SHEET 40X57 (DRAPES) ×2 IMPLANT
DRAPE INCISE IOBAN 66X45 STRL (DRAPES) IMPLANT
DRAPE ORTHO SPLIT 77X108 STRL (DRAPES) ×4
DRAPE SURG ORHT 6 SPLT 77X108 (DRAPES) ×2 IMPLANT
DRESSING PREVENA PLUS CUSTOM (GAUZE/BANDAGES/DRESSINGS) IMPLANT
DRSG ADAPTIC 3X8 NADH LF (GAUZE/BANDAGES/DRESSINGS) ×2 IMPLANT
DRSG PREVENA PLUS CUSTOM (GAUZE/BANDAGES/DRESSINGS)
ELECT CAUTERY BLADE 6.4 (BLADE) ×2 IMPLANT
ELECT REM PT RETURN 9FT ADLT (ELECTROSURGICAL) ×2
ELECTRODE REM PT RTRN 9FT ADLT (ELECTROSURGICAL) ×1 IMPLANT
EVACUATOR SILICONE 100CC (DRAIN) IMPLANT
GAUZE 4X4 16PLY RFD (DISPOSABLE) ×2 IMPLANT
GAUZE SPONGE 4X4 12PLY STRL (GAUZE/BANDAGES/DRESSINGS) ×3 IMPLANT
GLOVE BIO SURGEON STRL SZ 6 (GLOVE) ×1 IMPLANT
GLOVE BIOGEL PI IND STRL 6 (GLOVE) IMPLANT
GLOVE BIOGEL PI IND STRL 6.5 (GLOVE) IMPLANT
GLOVE BIOGEL PI IND STRL 7.5 (GLOVE) ×1 IMPLANT
GLOVE BIOGEL PI INDICATOR 6 (GLOVE) ×1
GLOVE BIOGEL PI INDICATOR 6.5 (GLOVE) ×1
GLOVE BIOGEL PI INDICATOR 7.5 (GLOVE) ×1
GLOVE SURG SS PI 7.5 STRL IVOR (GLOVE) ×2 IMPLANT
GOWN STRL REUS W/ TWL LRG LVL3 (GOWN DISPOSABLE) ×2 IMPLANT
GOWN STRL REUS W/ TWL XL LVL3 (GOWN DISPOSABLE) ×1 IMPLANT
GOWN STRL REUS W/TWL LRG LVL3 (GOWN DISPOSABLE) ×6
GOWN STRL REUS W/TWL XL LVL3 (GOWN DISPOSABLE) ×2
KIT BASIN OR (CUSTOM PROCEDURE TRAY) ×2 IMPLANT
KIT TURNOVER KIT B (KITS) ×2 IMPLANT
NS IRRIG 1000ML POUR BTL (IV SOLUTION) ×2 IMPLANT
PACK GENERAL/GYN (CUSTOM PROCEDURE TRAY) ×2 IMPLANT
PAD ARMBOARD 7.5X6 YLW CONV (MISCELLANEOUS) ×4 IMPLANT
PREVENA RESTOR ARTHOFORM 46X30 (CANNISTER) IMPLANT
STAPLER VISISTAT 35W (STAPLE) ×2 IMPLANT
STOCKINETTE IMPERVIOUS LG (DRAPES) ×2 IMPLANT
SUT ETHILON 3 0 PS 1 (SUTURE) IMPLANT
SUT SILK 0 TIES 10X30 (SUTURE) ×2 IMPLANT
SUT SILK 2 0 (SUTURE)
SUT SILK 2-0 18XBRD TIE 12 (SUTURE) IMPLANT
SUT SILK 3 0 (SUTURE)
SUT SILK 3-0 18XBRD TIE 12 (SUTURE) IMPLANT
SUT VIC AB 2-0 CT1 18 (SUTURE) ×4 IMPLANT
TAPE UMBILICAL COTTON 1/8X30 (MISCELLANEOUS) ×1 IMPLANT
TOWEL GREEN STERILE (TOWEL DISPOSABLE) ×2 IMPLANT
UNDERPAD 30X30 (UNDERPADS AND DIAPERS) ×2 IMPLANT
WATER STERILE IRR 1000ML POUR (IV SOLUTION) ×2 IMPLANT

## 2019-05-08 NOTE — Interval H&P Note (Signed)
History and Physical Interval Note:  05/08/2019 1:07 PM  [name]  has presented today for surgery, with the diagnosis of NON-VIABLE TISSUE RIGHT LOWER EXTREMITY.  The various methods of treatment have been discussed with the patient and family. After consideration of risks, benefits and other options for treatment, the patient has consented to  Procedure(s): AMPUTATION ABOVE KNEE RIGHT (Right) as a surgical intervention.  The patient's history has been reviewed, patient examined, no change in status, stable for surgery.  I have reviewed the patient's chart and labs.  Questions were answered to the patient's satisfaction.     Annamarie Major

## 2019-05-08 NOTE — Anesthesia Postprocedure Evaluation (Signed)
Anesthesia Post Note  Patient: [name]  Procedure(s) Performed: AMPUTATION ABOVE KNEE RIGHT (Right Knee)     Patient location during evaluation: PACU Anesthesia Type: General Level of consciousness: awake Pain management: pain level controlled Vital Signs Assessment: post-procedure vital signs reviewed and stable Respiratory status: spontaneous breathing, nonlabored ventilation, respiratory function stable and patient connected to nasal cannula oxygen Cardiovascular status: blood pressure returned to baseline and stable Postop Assessment: no apparent nausea or vomiting Anesthetic complications: no    Last Vitals:  Vitals:   05/08/19 1619 05/08/19 1650  BP: 113/87 113/87  Pulse: 71 71  Resp: 12 12  Temp: 36.6 C 36.6 C  SpO2: 98%     Last Pain:  Vitals:   05/08/19 1650  TempSrc: Oral  PainSc:                  Maelie Chriswell P Laraina Sulton

## 2019-05-08 NOTE — Plan of Care (Signed)
  Problem: Education: Goal: Knowledge of General Education information will improve Description Including pain rating scale, medication(s)/side effects and non-pharmacologic comfort measures Outcome: Progressing   Problem: Health Behavior/Discharge Planning: Goal: Ability to manage health-related needs will improve Outcome: Progressing   

## 2019-05-08 NOTE — Transfer of Care (Signed)
Immediate Anesthesia Transfer of Care Note  Patient: [name]  Procedure(s) Performed: AMPUTATION ABOVE KNEE RIGHT (Right Knee)  Patient Location: PACU  Anesthesia Type:General  Level of Consciousness: awake, alert  and oriented  Airway & Oxygen Therapy: Patient Spontanous Breathing  Post-op Assessment: Report given to RN and Post -op Vital signs reviewed and stable  Post vital signs: Reviewed and stable  Last Vitals:  Vitals Value Taken Time  BP 128/68 05/08/19 1425  Temp    Pulse 59 05/08/19 1425  Resp 16 05/08/19 1425  SpO2 99 % 05/08/19 1425  Vitals shown include unvalidated device data.  Last Pain:  Vitals:   05/08/19 1024  TempSrc:   PainSc: 0-No pain      Patients Stated Pain Goal: 4 ([id number])  Complications: No apparent anesthesia complications

## 2019-05-08 NOTE — Anesthesia Procedure Notes (Signed)
Procedure Name: Intubation Date/Time: 05/08/2019 1:14 PM Performed by: Kyung Rudd, CRNA Pre-anesthesia Checklist: Patient identified, Emergency Drugs available, Suction available, Patient being monitored and Timeout performed Patient Re-evaluated:Patient Re-evaluated prior to induction Oxygen Delivery Method: Circle system utilized Preoxygenation: Pre-oxygenation with 100% oxygen Induction Type: IV induction Ventilation: Mask ventilation without difficulty Laryngoscope Size: Mac and 4 Grade View: Grade I Tube type: Oral Tube size: 7.5 mm Number of attempts: 1 Airway Equipment and Method: Stylet Placement Confirmation: ETT inserted through vocal cords under direct vision,  positive ETCO2 and breath sounds checked- equal and bilateral Secured at: 21 cm Tube secured with: Tape Dental Injury: Teeth and Oropharynx as per pre-operative assessment

## 2019-05-08 NOTE — Op Note (Signed)
    Patient name: [name] MRN: [id number] DOB: 01-27-1951 Sex: male  05/08/2019 Pre-operative Diagnosis: Nonhealing right foot wound Post-operative diagnosis:  Same Surgeon:  Annamarie Major Assistants: Arlee Muslim, PA Procedure:   Right above-knee amputation Anesthesia: General Blood Loss: Minimal Specimens: Right leg  Findings: Healthy-appearing muscle and soft tissue at the level of amputation site  Indications: The patient has nonhealing wounds of his right leg.  He does not have options for revascularization.  He comes in today for amputation.  Procedure:  The patient was identified in the holding area and taken to [place] 15  The patient was then placed supine on the table. general anesthesia was administered.  The patient was prepped and draped in the usual sterile fashion.  A time out was called and antibiotics were administered.  A fishmouth incision was made just proximal to the patella.  Cautery was used to divide subcutaneous tissue down to the fascia which was opened with cautery.  I then circumferentially exposed the femur.  A periosteal elevator was used to elevate the periosteum.  A Gigli saw was then used to transect the femur beveling the anterior surface.  I then divided the remaining subcutaneous tissue and muscle with cautery.  I isolated the neurovascular bundle and divided it between clamps.  The leg was then removed as a specimen.  I then dissected out the artery nerve and vein and ligated them proximal to the cut edge of the femur with a 2-0 silk tie.  A rasp was used to smooth the bone surface.  The wound was irrigated.  Hemostasis was achieved.  The fascia was then reapproximated with interrupted 2-0 Vicryl.  The skin was closed with staples.  Sterile dressings were applied.  There were no immediate complications.   Disposition: To PACU stable   V. Annamarie Major, M.D., [facility] [place] Office: [phone] Pager:  [phone]

## 2019-05-09 ENCOUNTER — Encounter (HOSPITAL_COMMUNITY): Payer: Self-pay | Admitting: Surgery

## 2019-05-09 LAB — CBC
HCT: 36.1 % — ABNORMAL LOW (ref 39.0–52.0)
Hemoglobin: 11.7 g/dL — ABNORMAL LOW (ref 13.0–17.0)
MCH: 26.2 pg (ref 26.0–34.0)
MCHC: 32.4 g/dL (ref 30.0–36.0)
MCV: 80.8 fL (ref 80.0–100.0)
Platelets: 179 10*3/uL (ref 150–400)
RBC: 4.47 MIL/uL (ref 4.22–5.81)
RDW: 16.7 % — ABNORMAL HIGH (ref 11.5–15.5)
WBC: 11.4 10*3/uL — ABNORMAL HIGH (ref 4.0–10.5)
nRBC: 0 % (ref 0.0–0.2)

## 2019-05-09 LAB — GLUCOSE, CAPILLARY
Glucose-Capillary: 134 mg/dL — ABNORMAL HIGH (ref 70–99)
Glucose-Capillary: 113 mg/dL — ABNORMAL HIGH (ref 70–99)
Glucose-Capillary: 107 mg/dL — ABNORMAL HIGH (ref 70–99)
Glucose-Capillary: 137 mg/dL — ABNORMAL HIGH (ref 70–99)

## 2019-05-09 NOTE — Evaluation (Signed)
Physical Therapy Evaluation Patient Details Name: [name] MRN: [id number] DOB: December 14, 1950 Today's Date: 05/09/2019   History of Present Illness  Patient is a 68 y/o male admitted due to nonhealing R foot wound with osteomyelitis now s/p R AKA.  PMH positive for alcoholism, CAD, dementia, PAF, CKD, MI, CVA and hyperlipidemia.  Clinical Impression  Patient presents s/p AKA with decreased mobility due to pain, decreased strength with history of L foot drop and decreased safety awareness with history of dementia and decreased balance.  Currently mod to max A for mobility to EOB and unable to stand, but able to laterally scoot in the bed.  Feel he will benefit from skilled PT in the acute setting to maximize mobility and may be good candidate for CIR level rehab prior to d/c home.      Follow Up Recommendations CIR    Equipment Recommendations  Wheelchair cushion (measurements PT);Wheelchair (measurements PT)    Recommendations for Other Services Rehab consult     Precautions / Restrictions Precautions Precautions: Fall Restrictions Weight Bearing Restrictions: Yes RLE Weight Bearing: Non weight bearing Other Position/Activity Restrictions: R AKA      Mobility  Bed Mobility Overal bed mobility: Needs Assistance Bed Mobility: Supine to Sit;Sit to Supine     Supine to sit: HOB elevated;Max assist Sit to supine: Mod assist   General bed mobility comments: assist for hips and trunk, to supine assist for legs and to scoot up in bed, reposition shoulders  Transfers Overall transfer level: Needs assistance Equipment used: Rolling walker (2 wheeled) Transfers: Sit to/from Stand;Lateral/Scoot Transfers Sit to Stand: From elevated surface;Max assist        Lateral/Scoot Transfers: Mod assist General transfer comment: attempted x 3 trials to stand from EOB, but unable with max A with RW; able to scoot in bed in sitting with mod A with pad under hips  Ambulation/Gait                 Stairs            Wheelchair Mobility    Modified Rankin (Stroke Patients Only)       Balance Overall balance assessment: Needs assistance   Sitting balance-Leahy Scale: Fair Sitting balance - Comments: sat EOB about 6 minutes                                     Pertinent Vitals/Pain Pain Assessment: 0-10 Pain Score: 8  Pain Location: R LE Pain Descriptors / Indicators: Aching;Sore;Operative site guarding Pain Intervention(s): Monitored during session    Home Living Family/patient expects to be discharged to:: Private residence Living Arrangements: Other relatives(Gloria (neice)) Available Help at Discharge: Family;Friend(s);Available 24 hours/day(Gloria works, but Management consultant (firend) could stay during the day) Type of Home: House Home Access: Stairs to enter   [employer] of Steps: 1 Home Layout: One [place]: [place] - 2 wheels;Bedside commode;Shower seat - built in;Hand held shower head;Grab bars - tub/shower      Prior Function Level of Independence: Needs assistance   Gait / Transfers Assistance Needed: Patient reports no falls, but noted from previous stay 1 month ago 3 falls in one month  ADL's / Homemaking Assistance Needed: patient reports was dressing on his own, but note from previous stay neice assists with bathing, dressing, cooking and driving  Comments: pt enjoys watching tv, having conversations;     Hand Dominance  Extremity/Trunk Assessment   Upper Extremity Assessment Upper Extremity Assessment: LUE deficits/detail LUE Deficits / Details: grip weaker than the R, reports h/o stroke affecting the L    Lower Extremity Assessment Lower Extremity Assessment: LLE deficits/detail;RLE deficits/detail RLE Deficits / Details: AKA, able to lift and move laterally, not formally tested due to pain LLE Deficits / Details: foot drop on L, strength otherwise hip flexion 3+/5, knee extension 4-/5        Communication   Communication: No difficulties  Cognition Arousal/Alertness: Awake/alert Behavior During Therapy: WFL for tasks assessed/performed Overall Cognitive Status: No family/caregiver present to determine baseline cognitive functioning                                        General Comments      Exercises Amputee Exercises Hip Extension: AROM;Right;5 reps(isometric) Hip ABduction/ADduction: Right;5 reps;Supine   Assessment/Plan    PT Assessment Patient needs continued PT services  PT Problem List Decreased strength;Decreased activity tolerance;Decreased mobility;Decreased safety awareness;Decreased knowledge of precautions;Decreased coordination;Decreased balance;Decreased knowledge of use of DME;Pain       PT Treatment Interventions DME instruction;Therapeutic activities;Balance training;Functional mobility training;Therapeutic exercise;Patient/family education;Wheelchair mobility training    PT Goals (Current goals can be found in the Care Plan section)  Acute Rehab PT Goals Patient Stated Goal: to get stronger PT Goal Formulation: With patient Time For Goal Achievement: 05/23/19 Potential to Achieve Goals: Good    Frequency Min 3X/week   Barriers to discharge        Co-evaluation               AM-PAC PT "6 Clicks" Mobility  Outcome Measure Help needed turning from your back to your side while in a flat bed without using bedrails?: A Lot Help needed moving from lying on your back to sitting on the side of a flat bed without using bedrails?: Total Help needed moving to and from a bed to a chair (including a wheelchair)?: Total Help needed standing up from a chair using your arms (e.g., wheelchair or bedside chair)?: Total Help needed to walk in hospital room?: Total Help needed climbing 3-5 steps with a railing? : Total 6 Click Score: 7    End of Session Equipment Utilized During Treatment: Gait belt Activity Tolerance:  Patient tolerated treatment well Patient left: in bed;with call bell/phone within reach;with bed alarm set Nurse Communication: Mobility status PT Visit Diagnosis: Other abnormalities of gait and mobility (R26.89);Muscle weakness (generalized) (M62.81);Pain Pain - Right/Left: Right Pain - part of body: Leg    Time: [id number] PT Time Calculation (min) (ACUTE ONLY): 32 min   Charges:   PT Evaluation $PT Eval High Complexity: 1 High PT Treatments $Therapeutic Activity: 8-22 mins        Magda Kiel, [place] [facility] [phone] 05/09/2019   Reginia Naas 05/09/2019, 5:51 PM

## 2019-05-09 NOTE — Progress Notes (Addendum)
  Progress Note    05/09/2019 8:17 AM 1 Day Post-Op  Subjective:  Minimal pain R AKA stump overnight   Vitals:   05/09/19 0412 05/09/19 0743  BP: 113/63 120/70  Pulse: (!) 41 60  Resp: 15 20  Temp: (!) 97.4 F (36.3 C) 98 F (36.7 C)  SpO2: 100% 100%    Physical Exam: Incisions:  Dressing left in place, no breakthrough bleeding/drainage   CBC    Component Value Date/Time   WBC 11.4 (H) 05/09/2019 0326   RBC 4.47 05/09/2019 0326   HGB 11.7 (L) 05/09/2019 0326   HCT 36.1 (L) 05/09/2019 0326   PLT 179 05/09/2019 0326   MCV 80.8 05/09/2019 0326   MCH 26.2 05/09/2019 0326   MCHC 32.4 05/09/2019 0326   RDW 16.7 (H) 05/09/2019 0326   LYMPHSABS 3.0 03/14/2019 1348   MONOABS 0.5 03/14/2019 1348   EOSABS 0.1 03/14/2019 1348   BASOSABS 0.0 03/14/2019 1348    BMET    Component Value Date/Time   NA 138 05/08/2019 0953   K 4.0 05/08/2019 0953   CL 107 05/08/2019 0953   CO2 21 (L) 05/08/2019 0953   GLUCOSE 93 05/08/2019 0953   BUN 16 05/08/2019 0953   CREATININE 1.61 (H) 05/08/2019 0953   CALCIUM 9.1 05/08/2019 0953   GFRNONAA 44 (L) 05/08/2019 0953   GFRAA 51 (L) 05/08/2019 0953    INR    Component Value Date/Time   INR 1.0 05/08/2019 0953     Intake/Output Summary (Last 24 hours) at 05/09/2019 0817 Last data filed at 05/09/2019 0400 Gross per 24 hour  Intake 1479.48 ml  Output 100 ml  Net 1379.48 ml     Assessment/Plan:  68 y.o. male is s/p right above knee amputation  1 Day Post-Op  -Dressing change tomorrow - Pain medicine on schedule today - PT/OT/CIR eval pending   Dagoberto Ligas, PA-C [facility] [phone] 05/09/2019 8:17 AM   I agree with the above.  I have seen and evaluated to the patient.  Annamarie Major

## 2019-05-10 LAB — GLUCOSE, CAPILLARY
Glucose-Capillary: 101 mg/dL — ABNORMAL HIGH (ref 70–99)
Glucose-Capillary: 93 mg/dL (ref 70–99)
Glucose-Capillary: 108 mg/dL — ABNORMAL HIGH (ref 70–99)
Glucose-Capillary: 104 mg/dL — ABNORMAL HIGH (ref 70–99)

## 2019-05-10 LAB — CBC
HCT: 33.3 % — ABNORMAL LOW (ref 39.0–52.0)
Hemoglobin: 11 g/dL — ABNORMAL LOW (ref 13.0–17.0)
MCH: 26.2 pg (ref 26.0–34.0)
MCHC: 33 g/dL (ref 30.0–36.0)
MCV: 79.3 fL — ABNORMAL LOW (ref 80.0–100.0)
Platelets: 221 10*3/uL (ref 150–400)
RBC: 4.2 MIL/uL — ABNORMAL LOW (ref 4.22–5.81)
RDW: 16.4 % — ABNORMAL HIGH (ref 11.5–15.5)
WBC: 16.6 10*3/uL — ABNORMAL HIGH (ref 4.0–10.5)
nRBC: 0 % (ref 0.0–0.2)

## 2019-05-10 NOTE — Progress Notes (Addendum)
  Progress Note    05/10/2019 8:26 AM 2 Days Post-Op  Subjective:  No complaints this morning   Vitals:   05/09/19 1948 05/10/19 0356  BP: 106/69 136/68  Pulse: 70 70  Resp: 20 20  Temp: 98.2 F (36.8 C) 98 F (36.7 C)  SpO2: 98% 98%    Physical Exam: Incisions:  R AKA incision c/d/i   CBC    Component Value Date/Time   WBC 16.6 (H) 05/10/2019 0441   RBC 4.20 (L) 05/10/2019 0441   HGB 11.0 (L) 05/10/2019 0441   HCT 33.3 (L) 05/10/2019 0441   PLT 221 05/10/2019 0441   MCV 79.3 (L) 05/10/2019 0441   MCH 26.2 05/10/2019 0441   MCHC 33.0 05/10/2019 0441   RDW 16.4 (H) 05/10/2019 0441   LYMPHSABS 3.0 03/14/2019 1348   MONOABS 0.5 03/14/2019 1348   EOSABS 0.1 03/14/2019 1348   BASOSABS 0.0 03/14/2019 1348    BMET    Component Value Date/Time   NA 138 05/08/2019 0953   K 4.0 05/08/2019 0953   CL 107 05/08/2019 0953   CO2 21 (L) 05/08/2019 0953   GLUCOSE 93 05/08/2019 0953   BUN 16 05/08/2019 0953   CREATININE 1.61 (H) 05/08/2019 0953   CALCIUM 9.1 05/08/2019 0953   GFRNONAA 44 (L) 05/08/2019 0953   GFRAA 51 (L) 05/08/2019 0953    INR    Component Value Date/Time   INR 1.0 05/08/2019 0953     Intake/Output Summary (Last 24 hours) at 05/10/2019 0826 Last data filed at 05/10/2019 0357 Gross per 24 hour  Intake 560 ml  Output 800 ml  Net -240 ml     Assessment/Plan:  68 y.o. male is s/p right above knee amputation  2 Days Post-Op  - Dressing changed, incision unremarkable; will order stump sock for tomorrow - PT recommending CIR   Dagoberto Ligas, PA-C [facility] [phone] 05/10/2019 8:26 AM    I agree with the above.  I have seen and examined the patient.  CIR pending  Annamarie Major

## 2019-05-10 NOTE — Plan of Care (Signed)
Nursing will continue to monitor.  

## 2019-05-10 NOTE — Evaluation (Signed)
Occupational Therapy Evaluation Patient Details Name: [name] MRN: [id number] DOB: 12/21/1950 Today's Date: 05/10/2019    History of Present Illness Patient is a 68 y/o male admitted due to nonhealing R foot wound with osteomyelitis now s/p R AKA.  PMH positive for alcoholism, CAD, dementia, PAF, CKD, MI, CVA and hyperlipidemia.   Clinical Impression   Pt PTA: living at home and reports independence. Pt currently, pt performing ADL at EOB with set-upA for grooming and alternating hands to provide support at EOB for sitting balance poor to fair at this time. LLE with drop foot, unsafe for attempting sit to stand. Pt performing bed mobility with maxA. Pt minA overall for UB ADL and MaxA to [place] for LB ADL. Pt unsafe to attempt in standing +1 assist. Pt currently with functional limitiations due to the deficits in poor balance, decreased ability to care for self and increased need for assist in mobility.  Pt will benefit from skilled OT to increase their independence and safety with adls and balance to allow discharge to CIR. Pt remains highly motivated to return to PLOF. OT following acutely.     Follow Up Recommendations  CIR    Equipment Recommendations  3 in 1 bedside commode    Recommendations for Other Services       Precautions / Restrictions Precautions Precautions: Fall Restrictions Weight Bearing Restrictions: Yes RLE Weight Bearing: Non weight bearing      Mobility Bed Mobility Overal bed mobility: Needs Assistance Bed Mobility: Supine to Sit;Sit to Supine     Supine to sit: HOB elevated;Max assist Sit to supine: Mod assist   General bed mobility comments: assist for hips and trunk, to supine assist for legs and to scoot up in bed, reposition shoulders  Transfers Overall transfer level: Needs assistance              Lateral/Scoot Transfers: Max assist General transfer comment: lateral scoot x3 times towards HOB. pt trying minimally.    Balance  Overall balance assessment: Needs assistance   Sitting balance-Leahy Scale: Poor Sitting balance - Comments: Pt unable to get hip positioned without intermittent posterior lean when LUE would let go of railing. Postural control: Posterior lean                                 ADL either performed or assessed with clinical judgement   ADL Overall ADL's : Needs assistance/impaired Eating/Feeding: Modified independent   Grooming: Set up;Sitting;Bed level Grooming Details (indicate cue type and reason): able to hold on with 1 arm to EOB and brush teeth with the other Upper Body Bathing: Min guard;Sitting   Lower Body Bathing: Maximal assistance;Sitting/lateral leans;Bed level   Upper Body Dressing : Min guard;Sitting   Lower Body Dressing: Maximal assistance;Sitting/lateral leans;Bed level   Toilet Transfer: Total assistance   Toileting- Clothing Manipulation and Hygiene: Maximal assistance;Total assistance;Sitting/lateral lean;Bed level       Functional mobility during ADLs: Total assistance General ADL Comments: Pt performing ADL at EOB with set-upA for grooming and alternating hands to provide support at EOB for sitting balance poor to fair at this time. LLE with drop foort, unsafe for attempting sit to stand. Pt performing bed mobility with maxA.     Vision Baseline Vision/History: No visual deficits Vision Assessment?: No apparent visual deficits     Perception     Praxis      Pertinent Vitals/Pain Pain Assessment: 0-10 Pain Score:  1  Pain Location: R LE Pain Descriptors / Indicators: Aching;Sore;Operative site guarding     Hand Dominance Right   Extremity/Trunk Assessment Upper Extremity Assessment Upper Extremity Assessment: Generalized weakness   Lower Extremity Assessment Lower Extremity Assessment: Generalized weakness RLE Deficits / Details: AKA, able to lift and move laterally, not formally tested due to pain LLE Deficits / Details: foot  drop on L       Communication Communication Communication: No difficulties   Cognition Arousal/Alertness: Awake/alert Behavior During Therapy: WFL for tasks assessed/performed Overall Cognitive Status: No family/caregiver present to determine baseline cognitive functioning                                     General Comments       Exercises     Shoulder Instructions      Home Living Family/patient expects to be discharged to:: Private residence Living Arrangements: Other relatives Available Help at Discharge: Family;Friend(s);Available 24 hours/day Type of Home: House Home Access: Stairs to enter [employer] of Steps: 1 Entrance Stairs-Rails: None Home Layout: One level     Bathroom Shower/Tub: Occupational psychologist: Standard Bathroom Accessibility: Yes How Accessible: Accessible via walker Home Equipment: Plush - 2 wheels;Bedside commode;Shower seat - built in;Hand held shower head;Grab bars - tub/shower          Prior Functioning/Environment Level of Independence: Needs assistance  Gait / Transfers Assistance Needed: Patient reports no falls, but noted from previous stay 1 month ago 3 falls in one month ADL's / Homemaking Assistance Needed: patient reports was dressing on his own, but note from previous stay neice assists with bathing, dressing, cooking and driving            OT Problem List: Decreased strength;Decreased activity tolerance;Impaired balance (sitting and/or standing);Decreased safety awareness;Pain      OT Treatment/Interventions: Self-care/ADL training;Therapeutic exercise;Neuromuscular education;Energy conservation;Therapeutic activities;Patient/family education;Balance training    OT Goals(Current goals can be found in the care plan section) Acute Rehab OT Goals Patient Stated Goal: to get stronger OT Goal Formulation: With patient Time For Goal Achievement: 05/24/19 Potential to Achieve Goals:  Good ADL Goals Pt Will Perform Upper Body Dressing: with modified independence;sitting Pt Will Perform Lower Body Dressing: with min assist;sitting/lateral leans;sit to/from stand Pt Will Transfer to Toilet: with max assist;squat pivot transfer;bedside commode Additional ADL Goal #1: Pt will increase to minA overall with bed mobility  OT Frequency: Min 2X/week   Barriers to D/C:            Co-evaluation              AM-PAC OT "6 Clicks" Daily Activity     Outcome Measure Help from another person eating meals?: None Help from another person taking care of personal grooming?: A Little Help from another person toileting, which includes using toliet, bedpan, or urinal?: Total Help from another person bathing (including washing, rinsing, drying)?: A Lot Help from another person to put on and taking off regular upper body clothing?: A Little Help from another person to put on and taking off regular lower body clothing?: Total 6 Click Score: 14   End of Session Equipment Utilized During Treatment: Gait belt Nurse Communication: Mobility status  Activity Tolerance: Patient tolerated treatment well;Patient limited by pain Patient left: in bed;with call bell/phone within reach;with bed alarm set  OT Visit Diagnosis: Unsteadiness on feet (R26.81);Muscle weakness (generalized) (M62.81)  Time: 1100-1120 OT Time Calculation (min): 20 min Charges:  OT General Charges $OT Visit: 1 Visit OT Evaluation $OT Eval Moderate Complexity: 1 Mod  Darryl Nestle) Marsa Aris OTR/L Acute Rehabilitation Services Pager: [phone] Office: [place] 05/10/2019, 2:57 PM

## 2019-05-11 LAB — GLUCOSE, CAPILLARY
Glucose-Capillary: 89 mg/dL (ref 70–99)
Glucose-Capillary: 109 mg/dL — ABNORMAL HIGH (ref 70–99)
Glucose-Capillary: 103 mg/dL — ABNORMAL HIGH (ref 70–99)

## 2019-05-11 NOTE — Progress Notes (Signed)
Physical Therapy Treatment Patient Details Name: [name] MRN: [id number] DOB: 06-Jun-1951 Today's Date: 05/11/2019    History of Present Illness Patient is a 68 y/o male admitted due to nonhealing R foot wound with osteomyelitis now s/p R AKA.  PMH positive for alcoholism, CAD, dementia, PAF, CKD, MI, CVA and hyperlipidemia.    PT Comments    Patient progressing this session with successfully up to chair with lift equipment and +2 A.  Unaware of positioning of L foot and heavy lifting assist to get positioned in [place].  Feel patient more appropriate for SNF level rehab due to L LE weakness, cognitive deficits with limited carryover and poor safety awareness. PT to follow acutely.    Follow Up Recommendations  SNF     Equipment Recommendations  Wheelchair cushion (measurements PT);Wheelchair (measurements PT)    Recommendations for Other Services       Precautions / Restrictions Precautions Precautions: Fall Precaution Comments: watch BP Restrictions Weight Bearing Restrictions: Yes RLE Weight Bearing: Non weight bearing Other Position/Activity Restrictions: R AKA    Mobility  Bed Mobility Overal bed mobility: Needs Assistance Bed Mobility: Supine to Sit;Sit to Supine     Supine to sit: HOB elevated;Max assist     General bed mobility comments: assist for hips and trunk with cuing for hand placement  Transfers Overall transfer level: Needs assistance   Transfers: Sit to/from Stand Sit to Stand: From elevated surface;Max assist;+2 physical assistance         General transfer comment: max A for upright standing within stedy  Ambulation/Gait                 Stairs             Wheelchair Mobility    Modified Rankin (Stroke Patients Only)       Balance Overall balance assessment: Needs assistance Sitting-balance support: Bilateral upper extremity supported Sitting balance-Leahy Scale: Poor Sitting balance - Comments: UE support needed in  sitting Postural control: Posterior lean Standing balance support: Bilateral upper extremity supported Standing balance-Leahy Scale: Zero Standing balance comment: max A of 2 to stand                            Cognition Arousal/Alertness: Awake/alert Behavior During Therapy: WFL for tasks assessed/performed Overall Cognitive Status: No family/caregiver present to determine baseline cognitive functioning Area of Impairment: Orientation;Safety/judgement;Problem solving;Memory;Attention                 Orientation Level: Disoriented to;Time Current Attention Level: Sustained Memory: Decreased short-term memory   Safety/Judgement: Decreased awareness of safety;Decreased awareness of deficits   Problem Solving: Slow processing;Requires verbal cues;Requires tactile cues General Comments: h/o dementia, difficulty finding call bell initially till educated      Exercises      General Comments General comments (skin integrity, edema, etc.): L foot against foot board initially, noted area of pressure on toes at metatarsal joints, likely not new, but discussed with NT to assist with positioning when in bed      Pertinent Vitals/Pain Pain Assessment: Faces Faces Pain Scale: Hurts whole lot Pain Location: R LE Pain Descriptors / Indicators: Aching;Sore;Operative site guarding Pain Intervention(s): Monitored during session;Repositioned    Home Living                      Prior Function            PT Goals (current goals can  now be found in the care plan section) Acute Rehab PT Goals Patient Stated Goal: none stated Progress towards PT goals: Progressing toward goals    Frequency    Min 2X/week      PT Plan Discharge plan needs to be updated;Frequency needs to be updated    Co-evaluation PT/OT/SLP Co-Evaluation/Treatment: Yes Reason for Co-Treatment: To address functional/ADL transfers;For patient/therapist safety PT goals addressed during  session: Mobility/safety with mobility;Balance OT goals addressed during session: ADL's and self-care(functional transfer)      AM-PAC PT "6 Clicks" Mobility   Outcome Measure  Help needed turning from your back to your side while in a flat bed without using bedrails?: A Lot Help needed moving from lying on your back to sitting on the side of a flat bed without using bedrails?: Total Help needed moving to and from a bed to a chair (including a wheelchair)?: Total Help needed standing up from a chair using your arms (e.g., wheelchair or bedside chair)?: Total Help needed to walk in hospital room?: Total Help needed climbing 3-5 steps with a railing? : Total 6 Click Score: 7    End of Session Equipment Utilized During Treatment: Gait belt Activity Tolerance: Other (comment)(c/o light headed with lower BP in sitting so reclined) Patient left: in chair;with call bell/phone within reach Nurse Communication: Mobility status;Need for lift equipment PT Visit Diagnosis: Other abnormalities of gait and mobility (R26.89);Muscle weakness (generalized) (M62.81);Pain Pain - Right/Left: Right Pain - part of body: Leg     Time: 1124-1150 PT Time Calculation (min) (ACUTE ONLY): 26 min  Charges:  $Therapeutic Activity: 8-22 mins                     Magda Kiel, [place] [phone] 05/11/2019    Reginia Naas 05/11/2019, 1:24 PM

## 2019-05-11 NOTE — Progress Notes (Addendum)
  Progress Note    05/11/2019 8:29 AM 3 Days Post-Op  Subjective:  No pain overnight   Vitals:   05/10/19 2125 05/11/19 0359  BP: (!) 102/59 (!) 91/56  Pulse: 71 61  Resp:  16  Temp:  98 F (36.7 C)  SpO2:  97%   Physical Exam: Lungs:  Non labored Incisions:  R AKA incision c/d/i Neurologic: A&O  CBC    Component Value Date/Time   WBC 16.6 (H) 05/10/2019 0441   RBC 4.20 (L) 05/10/2019 0441   HGB 11.0 (L) 05/10/2019 0441   HCT 33.3 (L) 05/10/2019 0441   PLT 221 05/10/2019 0441   MCV 79.3 (L) 05/10/2019 0441   MCH 26.2 05/10/2019 0441   MCHC 33.0 05/10/2019 0441   RDW 16.4 (H) 05/10/2019 0441   LYMPHSABS 3.0 03/14/2019 1348   MONOABS 0.5 03/14/2019 1348   EOSABS 0.1 03/14/2019 1348   BASOSABS 0.0 03/14/2019 1348    BMET    Component Value Date/Time   NA 138 05/08/2019 0953   K 4.0 05/08/2019 0953   CL 107 05/08/2019 0953   CO2 21 (L) 05/08/2019 0953   GLUCOSE 93 05/08/2019 0953   BUN 16 05/08/2019 0953   CREATININE 1.61 (H) 05/08/2019 0953   CALCIUM 9.1 05/08/2019 0953   GFRNONAA 44 (L) 05/08/2019 0953   GFRAA 51 (L) 05/08/2019 0953    INR    Component Value Date/Time   INR 1.0 05/08/2019 0953     Intake/Output Summary (Last 24 hours) at 05/11/2019 0829 Last data filed at 05/11/2019 0401 Gross per 24 hour  Intake 120 ml  Output 1100 ml  Net -980 ml     Assessment/Plan:  68 y.o. male is s/p R AKA 3 Days Post-Op   Dressing changed; stump sock ordered PT/OT recommending IR Dispo: pending CIR eval tomorrow   Dagoberto Ligas, PA-C [facility] [phone] 05/11/2019 8:29 AM   I agree with the above  Annamarie Major

## 2019-05-11 NOTE — Care Management Important Message (Signed)
Important Message  Patient Details  Name: [name] MRN: [id number] Date of Birth: 13-Oct-1950   Medicare Important Message Given:  Yes     Jazmynn Pho 05/11/2019, 3:30 PM

## 2019-05-11 NOTE — Progress Notes (Signed)
Occupational Therapy Treatment Patient Details Name: [name] MRN: [id number] DOB: [birth year] Today's Date: 05/11/2019    History of present illness Patient is a 68 y/o male admitted due to nonhealing R foot wound with osteomyelitis now s/p R AKA.  PMH positive for alcoholism, CAD, dementia, PAF, CKD, MI, CVA and hyperlipidemia.   OT comments  Pt requrired Max- total +2 to stand into stedy and transfer into recliner chair. Pt with increased pain noted in R AKA with OT providing cuing for pt to rub and lightly tape the residual limb. Pt demonstrated understanding but needing cues to do so. Pt reports dizziness and BP taken once in chair with results of 95/43. Pt tilted back in recliner and BP increased to 102/65. Pt remained in wheelchair and positioned for comfort. Pt's discharge recommendation changed to SNF secondary to pt unable to participate in intensive therapy at this time. Pt continues to benefit from acute OT intervention.   Follow Up Recommendations  SNF    Equipment Recommendations  Other (comment)(defer to next venue of care)       Precautions / Restrictions Precautions Precautions: Fall Precaution Comments: watch BP Restrictions Weight Bearing Restrictions: Yes RLE Weight Bearing: Non weight bearing Other Position/Activity Restrictions: R AKA       Mobility Bed Mobility Overal bed mobility: Needs Assistance Bed Mobility: Supine to Sit;Sit to Supine     Supine to sit: HOB elevated;Max assist     General bed mobility comments: assist for hips and trunk with cuing for hand placement  Transfers Overall transfer level: Needs assistance   Transfers: Sit to/from Stand Sit to Stand: From elevated surface;Max assist;+2 physical assistance         General transfer comment: max A for upright standing within stedy    Balance Overall balance assessment: Needs assistance Sitting-balance support: Bilateral upper extremity supported Sitting balance-Leahy Scale:  Poor Sitting balance - Comments: UE support needed in sitting Postural control: Posterior lean Standing balance support: Bilateral upper extremity supported Standing balance-Leahy Scale: Zero Standing balance comment: max A of 2 to stand          ADL either performed or assessed with clinical judgement        Vision Baseline Vision/History: No visual deficits Vision Assessment?: No apparent visual deficits          Cognition Arousal/Alertness: Awake/alert Behavior During Therapy: WFL for tasks assessed/performed Overall Cognitive Status: No family/caregiver present to determine baseline cognitive functioning Area of Impairment: Orientation;Safety/judgement;Problem solving;Memory;Attention      Orientation Level: Disoriented to;Time Current Attention Level: Sustained Memory: Decreased short-term memory   Safety/Judgement: Decreased awareness of safety;Decreased awareness of deficits   Problem Solving: Slow processing;Requires verbal cues;Requires tactile cues General Comments: h/o dementia, difficulty finding call bell initially till educated              General Comments L foot against foot board initially, noted area of pressure on toes at metatarsal joints, likely not new, but discussed with NT to assist with positioning when in bed    Pertinent Vitals/ Pain       Pain Assessment: Faces Faces Pain Scale: Hurts whole lot Pain Location: R LE Pain Descriptors / Indicators: Aching;Sore;Operative site guarding Pain Intervention(s): Monitored during session;Repositioned         Frequency  Min 2X/week        Progress Toward Goals  OT Goals(current goals can now be found in the care plan section)  Progress towards OT goals: Progressing toward goals  Acute  Rehab OT Goals Patient Stated Goal: none stated  Plan Discharge plan needs to be updated    Co-evaluation    PT/OT/SLP Co-Evaluation/Treatment: Yes Reason for Co-Treatment: To address functional/ADL  transfers;For patient/therapist safety PT goals addressed during session: Mobility/safety with mobility;Balance OT goals addressed during session: ADL's and self-care(functional transfer)      AM-PAC OT "6 Clicks" Daily Activity     Outcome Measure   Help from another person eating meals?: None Help from another person taking care of personal grooming?: A Little Help from another person toileting, which includes using toliet, bedpan, or urinal?: Total Help from another person bathing (including washing, rinsing, drying)?: A Lot Help from another person to put on and taking off regular upper body clothing?: A Little Help from another person to put on and taking off regular lower body clothing?: Total 6 Click Score: 14    End of Session Equipment Utilized During Treatment: Gait belt  OT Visit Diagnosis: Unsteadiness on feet (R26.81);Muscle weakness (generalized) (M62.81)   Activity Tolerance Patient tolerated treatment well;Patient limited by pain   Patient Left in chair;with call bell/phone within reach;with chair alarm set   Nurse Communication Mobility status        Time: 1125-1150 OT Time Calculation (min): 25 min  Charges: OT General Charges $OT Visit: 1 Visit OT Treatments $Therapeutic Activity: 8-22 mins    Shayley Medlin P, MS, OTR/L 05/11/2019, 1:22 PM

## 2019-05-11 NOTE — Progress Notes (Signed)
Inpatient Rehabilitation-Admissions Coordinator   Noted PT and OT recommendations have changed from CIR to SNF. AC met with pt at the bedside for rehab assessment and agree with therapy rec's that due to pt's cognitive deficits and poor tolerance, he is better suited for SNF placement. AC has communicated recommendation to CM.   AC will sign off.   Jhonnie Garner, OTR/L  Rehab Admissions Coordinator  [phone] 05/11/2019 2:23 PM

## 2019-05-12 LAB — GLUCOSE, CAPILLARY
Glucose-Capillary: 85 mg/dL (ref 70–99)
Glucose-Capillary: 97 mg/dL (ref 70–99)
Glucose-Capillary: 141 mg/dL — ABNORMAL HIGH (ref 70–99)
Glucose-Capillary: 126 mg/dL — ABNORMAL HIGH (ref 70–99)

## 2019-05-12 NOTE — Progress Notes (Signed)
Orthopedic Tech Progress Note Patient Details:  [name] March 31, 1951 [id number] Called in order to [facility] for a AKA SOCK  Patient ID: [name], male   DOB: [birth year], 68 y.o.   MRN: [id number]   Janit Pagan 05/12/2019, 8:43 AM

## 2019-05-12 NOTE — Discharge Instructions (Signed)

## 2019-05-12 NOTE — Progress Notes (Addendum)
[facility] [place]  Subjective  - Doing OK states right leg feels better.   Objective 109/62 61 97.9 F (36.6 C) (Oral) 18 97%  Intake/Output Summary (Last 24 hours) at 05/12/2019 0709 Last data filed at 05/11/2019 1917 Gross per 24 hour  Intake 720 ml  Output 380 ml  Net 340 ml    Right AKA stump healing well with intact staples. Dressing left off Warm to touch Gen NAD  Assessment/Planning: POD # 4 right AKA  Pending CIR verses SNF Right AKA stump appears viable  Roxy Horseman 05/12/2019 7:09 AM --  Laboratory Lab Results: Recent Labs    05/10/19 0441  WBC 16.6*  HGB 11.0*  HCT 33.3*  PLT 221   BMET No results for input(s): NA, K, CL, CO2, GLUCOSE, BUN, CREATININE, CALCIUM in the last 72 hours.  COAG Lab Results  Component Value Date   INR 1.0 05/08/2019   No results found for: PTT   I agree with the above.  I have seen and examined the patient.  Plan is for SNF  [place] Dominyck Reser

## 2019-05-13 LAB — GLUCOSE, CAPILLARY: Glucose-Capillary: 108 mg/dL — ABNORMAL HIGH (ref 70–99)

## 2019-05-13 MED ORDER — INFLUENZA VAC A&B SA ADJ QUAD 0.5 ML IM PRSY
0.5000 mL | PREFILLED_SYRINGE | INTRAMUSCULAR | Status: AC
  Administered 2019-05-14: 0.5 mL via INTRAMUSCULAR
  Filled 2019-05-13: qty 0.5

## 2019-05-13 NOTE — NC FL2 (Signed)
[place] LEVEL OF CARE SCREENING TOOL     IDENTIFICATION  Patient Name: [name] Birthdate: [birth year] Sex: male Admission Date (Current Location): 05/08/2019  [place] and [place] Number:  Kathleen Argue [id number] [place] and Address:  [facility], [place] [address] [place], [place] [address]      Provider Number: [id number]  Attending Physician Name and Address:  Serafina Mitchell, MD  Relative Name and Phone Number:  Jodeci Cink, niece,    Current Level of Care: Hospital Recommended Level of Care: [place] Prior Approval Number:    Date Approved/Denied:   PASRR Number: [id number]  Discharge Plan: SNF    Current Diagnoses: Patient Active Problem List   Diagnosis Date Noted  . Foot ulcer ([place]) 05/08/2019  . Acute osteomyelitis of metatarsal bone of right foot ([place]) 04/27/2019  . Critical lower limb ischemia 04/25/2019  . Pressure injury of skin 03/20/2019  . Osteomyelitis of right foot ([place]) 03/15/2019  . Wound of right foot 03/14/2019  . Coronary artery disease   . Hyperlipidemia   . Hypertension   . Paroxysmal atrial fibrillation (HCC)   . CKD (chronic kidney disease), stage IV ([place])   . Dementia ([place]) 02/10/2019    Orientation RESPIRATION BLADDER Height & Weight     Self, Place  Normal  Continent (ex cath) Weight: 160 lb 4.4 oz (72.7 kg) Height:  6\' 2"  (188 cm)  BEHAVIORAL SYMPTOMS/MOOD NEUROLOGICAL BOWEL NUTRITION STATUS       Continent Diet(see discharge summary)  AMBULATORY STATUS COMMUNICATION OF NEEDS Skin   Extensive Assist Verbally Surgical wounds(incision on R leg with compression wrap)                       Personal Care Assistance Level of Assistance  Feeding, Bathing, Dressing Bathing Assistance: Maximum assistance Feeding assistance: Independent Dressing Assistance: Maximum assistance     Functional Limitations Info  Sight, Hearing, Speech Sight Info: Adequate Hearing Info:  Adequate Speech Info: Adequate    SPECIAL CARE FACTORS FREQUENCY  PT (By licensed PT), OT (By licensed OT)     PT Frequency: 5x week OT Frequency: 5x week            Contractures Contractures Info: Not present    Additional Factors Info  Code Status, Allergies Code Status Info: Full Code Allergies Info: No Known Allergies           Current Medications (05/13/2019):  This is the current hospital active medication list Current Facility-Administered Medications  Medication Dose Route Frequency Provider Last Rate Last Dose  . acetaminophen (TYLENOL) tablet 325-650 mg  325-650 mg Oral Q4H PRN Dagoberto Ligas, PA-C       Or  . acetaminophen (TYLENOL) suppository 325-650 mg  325-650 mg Rectal Q4H PRN Dagoberto Ligas, PA-C      . alum & mag hydroxide-simeth (MAALOX/MYLANTA) 200-200-20 MG/5ML suspension 15-30 mL  15-30 mL Oral Q2H PRN Dagoberto Ligas, PA-C      . amiodarone (PACERONE) tablet 200 mg  200 mg Oral BID Dagoberto Ligas, PA-C   200 mg at 05/13/19 0948  . apixaban (ELIQUIS) tablet 5 mg  5 mg Oral BID WC Dagoberto Ligas, PA-C   5 mg at 05/13/19 0948  . aspirin EC tablet 81 mg  81 mg Oral Daily Dagoberto Ligas, PA-C   81 mg at 05/13/19 0948  . atorvastatin (LIPITOR) tablet 40 mg  40 mg Oral QHS Dagoberto Ligas, PA-C   40 mg at  05/12/19 2125  . docusate sodium (COLACE) capsule 100 mg  100 mg Oral Daily Dagoberto Ligas, PA-C   100 mg at 05/13/19 0948  . guaiFENesin-dextromethorphan (ROBITUSSIN DM) 100-10 MG/5ML syrup 15 mL  15 mL Oral Q4H PRN Dagoberto Ligas, PA-C      . hydrALAZINE (APRESOLINE) injection 5 mg  5 mg Intravenous Q20 Min PRN Dagoberto Ligas, PA-C      . [START ON 05/14/2019] influenza vaccine adjuvanted (FLUAD) injection 0.5 mL  0.5 mL Intramuscular Tomorrow-1000 Serafina Mitchell, MD      . labetalol (NORMODYNE) injection 10 mg  10 mg Intravenous Q10 min PRN Dagoberto Ligas, PA-C      . magnesium sulfate IVPB 2 g 50 mL  2 g Intravenous Daily PRN Dagoberto Ligas, PA-C      . metoprolol tartrate (LOPRESSOR) injection 2-5 mg  2-5 mg Intravenous Q2H PRN Dagoberto Ligas, PA-C      . morphine 2 MG/ML injection 2-4 mg  2-4 mg Intravenous Q2H PRN Dagoberto Ligas, PA-C      . ondansetron (ZOFRAN) injection 4 mg  4 mg Intravenous Q6H PRN Dagoberto Ligas, PA-C      . oxyCODONE-acetaminophen (PERCOCET/ROXICET) 5-325 MG per tablet 1-2 tablet  1-2 tablet Oral Q4H PRN Dagoberto Ligas, PA-C   2 tablet at 05/12/19 2125  . pantoprazole (PROTONIX) EC tablet 40 mg  40 mg Oral Daily Dagoberto Ligas, PA-C   40 mg at 05/13/19 0948  . phenol (CHLORASEPTIC) mouth spray 1 spray  1 spray Mouth/Throat PRN Dagoberto Ligas, PA-C      . polyethylene glycol (MIRALAX / GLYCOLAX) packet 17 g  17 g Oral Daily PRN Dagoberto Ligas, PA-C      . potassium chloride SA (K-DUR) CR tablet 20-40 mEq  20-40 mEq Oral Daily PRN Dagoberto Ligas, PA-C         Discharge Medications: Please see discharge summary for a list of discharge medications.  Relevant Imaging Results:  Relevant Lab Results:   Additional Information SS#[id number] [address] [place], [place]

## 2019-05-13 NOTE — Progress Notes (Addendum)
[facility] [place]  Subjective  - Doing well over all   Objective 104/88 64 97.9 F (36.6 C) (Oral) 17 98%  Intake/Output Summary (Last 24 hours) at 05/13/2019 0716 Last data filed at 05/13/2019 0602 Gross per 24 hour  Intake 400 ml  Output 1050 ml  Net -650 ml    Right AKA stump healing well Retention sock in place Lungs non labored breathing  Assessment/Planning: POD # 5 right AKA  Pending SNF for rehab  Roxy Horseman 05/13/2019 7:16 AM --  Laboratory Lab Results: No results for input(s): WBC, HGB, HCT, PLT in the last 72 hours. BMET No results for input(s): NA, K, CL, CO2, GLUCOSE, BUN, CREATININE, CALCIUM in the last 72 hours.  COAG Lab Results  Component Value Date   INR 1.0 05/08/2019   No results found for: PTT  I agree with the above.  I have seen and evaluated the patient.  We are awaiting disposition to SNF  [place] Caesar Mannella

## 2019-05-14 MED ORDER — OXYCODONE-ACETAMINOPHEN 5-325 MG PO TABS: 1.0000 | ORAL_TABLET | Freq: Four times a day (QID) | ORAL | 0 refills | Status: DC | PRN

## 2019-05-14 NOTE — Progress Notes (Signed)
Pt s/p left LE amputation  Durable Medical Equipment (From admission, onward)       Start     Ordered  05/14/19 1207   For home use only DME Hospital bed  Once   Question Answer Comment Length of Need 6 Months  Patient has (list medical condition): Weakness and s/p left LE amputation  The above medical condition requires: Patient requires the ability to reposition frequently  Head must be elevated greater than: Other see comments  Bed type Semi-electric  Hoyer Lift Yes  Trapeze Bar Yes  Support Surface: Gel Overlay    05/14/19 1207  05/14/19 1108   For home use only DME 3 n 1  Once    05/14/19 1110  05/14/19 1108   For home use only DME lightweight manual wheelchair with seat cushion  Once   Comments: Patient suffers from right AKA which impairs their ability to perform daily activities like bathing in the home.  A walker will not resolve  issue with performing activities of daily living. A wheelchair will allow patient to safely perform daily activities. Patient is not able to propel themselves in the home using a standard weight wheelchair due to general weakness. Patient can self propel in the lightweight wheelchair. Length of need Lifetime. Accessories: elevating leg rests (ELRs), wheel locks, extensions and anti-tippers. Back cushion  05/14/19 1110

## 2019-05-14 NOTE — Progress Notes (Signed)
      PT Note: 05/14/2019   Patient suffers from right AKA which impairs their ability to perform daily activities like bathing in the home.  A walker will not resolve  issue with performing activities of daily living. A wheelchair will allow patient to safely perform daily activities. Patient is not able to propel themselves in the home using a standard weight wheelchair due to general weakness. Patient can self propel in the lightweight wheelchair. Length of need Lifetime. Accessories: elevating leg rests (ELRs), wheel locks, retractable armrests, extensions and anti-tippers.          Magda Kiel, [place] [phone] 05/14/2019

## 2019-05-14 NOTE — TOC Transition Note (Signed)
Transition of Care [facility]) - CM/SW Discharge Note Marvetta Gibbons RN, BSN Transitions of Care Unit 4E- RN Case Manager [phone]   Patient Details  Name: [name] MRN: [id number] Date of Birth: [birth year]  Transition of Care [facility]) CM/SW Contact:  Dawayne Patricia, RN Phone Number: 05/14/2019, 12:20 PM   Clinical Narrative:    Pt stable for transition home- per CSW niece does not want pt going to [facility] and desires return home with [facility]- pt was active with [facility]- call made to [facility] and confirmed active status- will have orders placed to resume services- PT/OT/aide needed s/p AKA. Niece also requesting DME- hospital bed, hoyer, w/c and 3n1- requested orders have been placed- call made to [facility] with [place] for DME needs- w/c to be delivered to room for transport needs as niece will plan to transport home via private car, other DME to be delivered to home- per [employer] delivery will be tomorrow 9/11. Niece will be able to come provide transport tomorrow (unable to come today due to work). Notified primary team of transition plans.    Final next level of care: [place] Barriers to Discharge: No Barriers Identified   Patient Goals and CMS Choice Patient states their goals for this hospitalization and ongoing recovery are:: to get the patient back home CMS Medicare.gov Compare Post Acute Care list provided to:: Patient Represenative (must comment)(niece)    Discharge Placement  Home with [facility]                     Discharge Plan and Services In-house Referral: Clinical Social Work Discharge Planning Services: CM Consult Post Acute Care Choice: Home Health, Resumption of Svcs/PTA Provider          DME Arranged: 3-N-1, Wheelchair manual, Hospital bed DME Agency: [employer] Date DME Agency Contacted: 05/14/19 Time DME Agency Contacted: 1114 Representative spoke with at DME Agency: Thedore Mins HH Arranged: PT, OT, Nurse's Aide [place] Agency: [place] Date [place]: 05/14/19 Time [place]: 1115 Representative spoke with at [place]: [place] ([place]) Interventions     Readmission Risk Interventions Readmission Risk Prevention Plan 05/14/2019 03/23/2019  Post Dischage Appt Complete -  Medication Screening Complete -  Transportation Screening - Complete  PCP or Specialist Appt within 5-7 Days - Complete  Home Care Screening - Complete  Medication Review (RN CM) - Complete

## 2019-05-14 NOTE — TOC Initial Note (Signed)
Transition of Care [facility]) - Initial/Assessment Note    Patient Details  Name: [name] MRN: [id number] Date of Birth: 1951-08-12  Transition of Care [facility]) CM/SW Contact:    Vinie Sill, [place] Phone Number: 05/14/2019, 11:06 AM  Clinical Narrative:                  CSW spoke with the patient's niece, [name], regarding patient going to rehab at [facility] before returning home. She states if the patient is unable to get inpatient rehab, she request the patient be discharged home. She is concerned that the patient maybe exposed to Covid and does not want to take that risk. She states she is comfortable with caring for the patient at home. She works from 2:00pm to 10:30pm but indicates she has someone to come and check in on the patient. She states patient  was active with [facility] and would like to resume services. She requested hospital bed, wheel chair and 3n1 for patient. CSW updated RNCM.  [name], MSW, [facility] Clinical Social Worker [phone]   Expected Discharge Plan: [place]     Patient Goals and CMS Choice Patient states their goals for this hospitalization and ongoing recovery are:: to get the patient back home      Expected Discharge Plan and Services Expected Discharge Plan: [place] In-house Referral: Clinical Social Work     Living arrangements for the past 2 months: Single Family Home Expected Discharge Date: 05/14/19                                    Prior Living Arrangements/Services Living arrangements for the past 2 months: [place] Lives with:: Self, Relatives Patient language and need for interpreter reviewed:: Yes        Need for Family Participation in Patient Care: Yes (Comment) Care giver support system in place?: Yes (comment)   Criminal Activity/Legal Involvement Pertinent to Current Situation/Hospitalization: No - Comment as needed  Activities of Daily Living Home  Assistive Devices/Equipment: Walker (specify type) ADL Screening (condition at time of admission) Patient's cognitive ability adequate to safely complete daily activities?: No Is the patient deaf or have difficulty hearing?: No Does the patient have difficulty seeing, even when wearing glasses/contacts?: No Does the patient have difficulty concentrating, remembering, or making decisions?: Yes Patient able to express need for assistance with ADLs?: Yes Does the patient have difficulty dressing or bathing?: Yes Independently performs ADLs?: No Communication: Needs assistance Is this a change from baseline?: Pre-admission baseline Dressing (OT): Needs assistance Is this a change from baseline?: Pre-admission baseline Grooming: Needs assistance Is this a change from baseline?: Pre-admission baseline Feeding: Independent Bathing: Needs assistance Is this a change from baseline?: Pre-admission baseline Toileting: Dependent Is this a change from baseline?: Pre-admission baseline In/Out Bed: Dependent Is this a change from baseline?: Pre-admission baseline Walks in Home: Dependent Is this a change from baseline?: Pre-admission baseline Does the patient have difficulty walking or climbing stairs?: Yes Weakness of Legs: Left Weakness of Arms/Hands: None  Permission Sought/Granted Permission sought to share information with : Case Manager, Family Supports Permission granted to share information with : Yes, Verbal Permission Granted  Share Information with NAME: Winner Courtwright     Permission granted to share info w Relationship: Niece  Permission granted to share info w Contact Information: [phone]  Emotional Assessment  Orientation: : Oriented to Self, Oriented to Place Alcohol / Substance Use: Not Applicable Psych Involvement: No (comment)  Admission diagnosis:  NON-VIABLE TISSUE RIGHT LOWER EXTREMITY Patient Active Problem List   Diagnosis Date Noted  . Foot ulcer  ([place]) 05/08/2019  . Acute osteomyelitis of metatarsal bone of right foot ([place]) 04/27/2019  . Critical lower limb ischemia 04/25/2019  . Pressure injury of skin 03/20/2019  . Osteomyelitis of right foot ([place]) 03/15/2019  . Wound of right foot 03/14/2019  . Coronary artery disease   . Hyperlipidemia   . Hypertension   . Paroxysmal atrial fibrillation (HCC)   . CKD (chronic kidney disease), stage IV ([place])   . Dementia ([place]) 02/10/2019   PCP:  [name], MD Pharmacy:   [id number] [address], [place] [address] Phone: [phone] Fax: [phone]     Social Determinants of Health (SDOH) Interventions    Readmission Risk Interventions Readmission Risk Prevention Plan 03/23/2019  Transportation Screening Complete  PCP or Specialist Appt within 5-7 Days Complete  Home Care Screening Complete  Medication Review (RN CM) Complete

## 2019-05-14 NOTE — Progress Notes (Addendum)
[facility] [place]  Subjective  - No new complaints.   Objective (!) 154/64 66 97.7 F (36.5 C) (Oral) 18 92%  Intake/Output Summary (Last 24 hours) at 05/14/2019 0705 Last data filed at 05/13/2019 1300 Gross per 24 hour  Intake 480 ml  Output 500 ml  Net -20 ml    Right AKA stump warm to touch with intact staples healing well. Lungs non labored breathing Gen NAD  Assessment/Planning: POD # 6 right AKA  Viable stump Plan to f/u in 4 weeks for staple removal Cont. Eliquis, asa and statin. Discharge to SNF  [facility] 05/14/2019 7:05 AM --  Laboratory Lab Results: No results for input(s): WBC, HGB, HCT, PLT in the last 72 hours. BMET No results for input(s): NA, K, CL, CO2, GLUCOSE, BUN, CREATININE, CALCIUM in the last 72 hours.  COAG Lab Results  Component Value Date   INR 1.0 05/08/2019   No results found for: PTT   I  Agree with the above.  For discharge today  [name]

## 2019-05-14 NOTE — Progress Notes (Signed)
Physical Therapy Treatment Patient Details Name: [name] MRN: [id number] DOB: 1951-02-02 Today's Date: 05/14/2019    History of Present Illness Patient is a 68 y/o male admitted due to nonhealing R foot wound with osteomyelitis now s/p R AKA.  PMH positive for alcoholism, CAD, dementia, PAF, CKD, MI, CVA and hyperlipidemia.    PT Comments    Patient progressing slowly and limited this session due to c/o sore bottom and difficulty scooting.  Feel he continues to need skilled PT in the acute setting and follow up SNF level rehab, however, family has refused SNF so recommend HHPT, hospital bed, hoyer lift, w/c with pressure relieving cushion and 3:1 with drop arm.  PT to follow.    Follow Up Recommendations  SNF     Equipment Recommendations  Wheelchair cushion (measurements PT);Wheelchair (measurements PT)    Recommendations for Other Services       Precautions / Restrictions Precautions Precautions: Fall Restrictions Weight Bearing Restrictions: Yes RLE Weight Bearing: Non weight bearing    Mobility  Bed Mobility Overal bed mobility: Needs Assistance Bed Mobility: Supine to Sit;Sit to Supine;Rolling Rolling: Min assist   Supine to sit: HOB elevated;Max assist Sit to supine: Mod assist   General bed mobility comments: assist to come up to sit with HOB elevated but difficulty scooting out to EOB due to pain on bottom, returned to supine to place lift pad for OOB  Transfers Overall transfer level: Needs assistance               General transfer comment: bed to chair via maximove with +2 A.  Ambulation/Gait                 Stairs             Wheelchair Mobility    Modified Rankin (Stroke Patients Only)       Balance Overall balance assessment: Needs assistance Sitting-balance support: Single extremity supported Sitting balance-Leahy Scale: Poor Sitting balance - Comments: able to sit about 3 minutes to eat hamburger with L hand with S,  then fatigued so returned to supine to place sling for lift                                    Cognition Arousal/Alertness: Awake/alert Behavior During Therapy: WFL for tasks assessed/performed Overall Cognitive Status: No family/caregiver present to determine baseline cognitive functioning Area of Impairment: Attention;Following commands;Safety/judgement                   Current Attention Level: Sustained Memory: Decreased short-term memory Following Commands: Follows one step commands inconsistently;Follows one step commands with increased time Safety/Judgement: Decreased awareness of safety;Decreased awareness of deficits   Problem Solving: Slow processing;Requires verbal cues;Requires tactile cues General Comments: a little agitated when found out not leaving today, had pulled off telemetry; agreeable to OOB      Exercises      General Comments        Pertinent Vitals/Pain Pain Assessment: Faces Faces Pain Scale: Hurts even more Pain Location: bottom with sitting and scooting Pain Descriptors / Indicators: Grimacing;Discomfort;Moaning Pain Intervention(s): Monitored during session;Repositioned    Home Living                      Prior Function            PT Goals (current goals can now be found in the care  plan section) Progress towards PT goals: Progressing toward goals    Frequency    Min 2X/week      PT Plan Current plan remains appropriate    Co-evaluation              AM-PAC PT "6 Clicks" Mobility   Outcome Measure  Help needed turning from your back to your side while in a flat bed without using bedrails?: A Lot Help needed moving from lying on your back to sitting on the side of a flat bed without using bedrails?: Total Help needed moving to and from a bed to a chair (including a wheelchair)?: Total Help needed standing up from a chair using your arms (e.g., wheelchair or bedside chair)?: Total Help needed  to walk in hospital room?: Total Help needed climbing 3-5 steps with a railing? : Total 6 Click Score: 7    End of Session   Activity Tolerance: Patient tolerated treatment well Patient left: in chair;with call bell/phone within reach;with chair alarm set Nurse Communication: Need for lift equipment;Mobility status PT Visit Diagnosis: Other abnormalities of gait and mobility (R26.89);Muscle weakness (generalized) (M62.81);Pain Pain - Right/Left: Right Pain - part of body: Leg     Time: [id number] PT Time Calculation (min) (ACUTE ONLY): 18 min  Charges:  $Therapeutic Activity: 8-22 mins                     Magda Kiel, [place] Acute Rehabilitation Services [phone] 05/14/2019    Reginia Naas 05/14/2019, 3:12 PM

## 2019-05-14 NOTE — Discharge Summary (Addendum)
[facility] Discharge Summary   Patient ID:  [name] MRN: [id number] DOB/AGE: Jun 19, 1951 68 y.o.  Admit date: 05/08/2019 Discharge date: 05/15/2019  Date of Surgery: 05/08/2019 Surgeon: Surgeon(s): Serafina Mitchell, MD  Admission Diagnosis: NON-VIABLE TISSUE RIGHT LOWER EXTREMITY  Discharge Diagnoses:  NON-VIABLE TISSUE RIGHT LOWER EXTREMITY  Secondary Diagnoses: Past Medical History:  Diagnosis Date  . Alcoholism ([place])   . Complication of anesthesia   . Coronary artery disease   . Dementia ([place])   . Hyperlipidemia   . Hypertension   . PAD (peripheral artery disease) ([facility])    non-viable tissue RLE  . Paroxysmal atrial fibrillation ([facility])   . Pre-diabetes   . Stroke [facility])     Procedure(s): AMPUTATION ABOVE KNEE RIGHT  Discharged Condition: stable  HPI: [name] is a 68 y.o. male that I saw in the hospital for a right foot wound.  He underwent angiography on 03/20/2019 and did not have options for revascularization.  He was scheduled for right AKA.   Hospital Course:  [name] is a 68 y.o. male is S/P  Procedure(s): AMPUTATION ABOVE KNEE RIGHT  Post op he has a viable right stump that is healing well with retension sock from [employer].  His Eliquis was restarted for A fib.  Plan to discharge home with PT/OT/Home health aide.  Equipment needs ordered included 3 in 1, hospital bed, and light weight WC.  Disposition stable for discharge.    He will cont to take asa and statin daily.     Significant Diagnostic Studies: CBC Lab Results  Component Value Date   WBC 16.6 (H) 05/10/2019   HGB 11.0 (L) 05/10/2019   HCT 33.3 (L) 05/10/2019   MCV 79.3 (L) 05/10/2019   PLT 221 05/10/2019    BMET    Component Value Date/Time   NA 138 05/08/2019 0953   K 4.0 05/08/2019 0953   CL 107 05/08/2019 0953   CO2 21 (L) 05/08/2019 0953   GLUCOSE 93 05/08/2019 0953   BUN 16 05/08/2019 0953   CREATININE 1.61 (H) 05/08/2019 0953   CALCIUM 9.1  05/08/2019 0953   GFRNONAA 44 (L) 05/08/2019 0953   GFRAA 51 (L) 05/08/2019 0953   COAG Lab Results  Component Value Date   INR 1.0 05/08/2019     Disposition:  Discharge to :Home Discharge Instructions    Call MD for:  redness, tenderness, or signs of infection (pain, swelling, bleeding, redness, odor or green/yellow discharge around incision site)   Complete by: As directed    Call MD for:  severe or increased pain, loss or decreased feeling  in affected limb(s)   Complete by: As directed    Call MD for:  temperature >100.5   Complete by: As directed    Resume previous diet   Complete by: As directed      Allergies as of 05/14/2019   No Known Allergies     Medication List    TAKE these medications   amiodarone 200 MG tablet Commonly known as: PACERONE Take 200 mg by mouth 2 (two) times daily.   aspirin EC 81 MG tablet Take 81 mg by mouth daily.   atorvastatin 40 MG tablet Commonly known as: LIPITOR Take 40 mg by mouth at bedtime.   Eliquis 5 MG Tabs tablet Generic drug: apixaban Take 5 mg by mouth 2 (two) times daily with a meal.   folic acid 1 MG tablet Commonly known as: FOLVITE Take 1 mg by mouth daily.  oxyCODONE-acetaminophen 5-325 MG tablet Commonly known as: PERCOCET/ROXICET Take 1 tablet by mouth every 6 (six) hours as needed for moderate pain.      Verbal and written Discharge instructions given to the patient. Wound care per Discharge AVS Follow-up Information    Serafina Mitchell, MD Follow up in 4 week(s).   Specialties: Vascular Surgery, Cardiology Why: office will call Contact information: [address] [place] [address] [phone]           Signed: Roxy Horseman 05/14/2019, 7:15 AM

## 2019-05-14 NOTE — Progress Notes (Signed)
Pt pulled out IV. Verbal order from Dr. Carlis Abbott that pt can go without IV access because he will be discharged tomorrow.  [name], RN

## 2019-05-14 NOTE — Progress Notes (Signed)
[facility] Progress Note Patient Details:  [name] Oct 03, 1950 [id number]  Patient ID: [name], male   DOB: 07/05/1951, 68 y.o.   MRN: [id number]   [name] 05/14/2019, 2:25 PMCalled [employer] for replacement stump sock.

## 2019-05-14 NOTE — Progress Notes (Signed)
Called patient's niece, [name], left voice message to return call. Unable to complete SNF assessment .  CSW will continue to follow and assist with discharge planning.  [name], MSW, [place] Social Worker [phone]

## 2019-05-14 NOTE — TOC Progression Note (Signed)
Transition of Care [facility]) - Progression Note    Patient Details  Name: [name] MRN: [id number] Date of Birth: 1951/06/21  Transition of Care [facility]) CM/SW [place], [place] Phone Number: 05/14/2019, 12:13 PM  Clinical Narrative:    Patient's niece, [name] states she is unable to pick up patient today because she has to work . She is off work Architectural technologist and will have someone to assist her getting the patient safely home. CSW informed her the hospital bed will be delivered tomorrow.  RNCM updated  [name], MSW, [facility] Clinical Social Worker [phone]   Expected Discharge Plan: [place] Barriers to Discharge: No Barriers Identified  Expected Discharge Plan and Services Expected Discharge Plan: [place] In-house Referral: Clinical Social Work Discharge Planning Services: CM Consult Post Acute Care Choice: Home Health, Resumption of Svcs/PTA Provider Living arrangements for the past 2 months: Single Family Home Expected Discharge Date: 05/14/19               DME Arranged: 3-N-1, Wheelchair manual, Hospital bed DME Agency: [employer] Date DME Agency Contacted: 05/14/19 Time DME Agency Contacted: 1114 Representative spoke with at DME Agency: Thedore Mins HH Arranged: PT, OT, Nurse's Aide [place] Agency: [place] Date [place]: 05/14/19 Time [place]: 1115 Representative spoke with at [place]: [place] ([place]) Interventions    Readmission Risk Interventions Readmission Risk Prevention Plan 05/14/2019 03/23/2019  Post Dischage Appt Complete -  Medication Screening Complete -  Transportation Screening - Complete  PCP or Specialist Appt within 5-7 Days - Complete  Home Care Screening - Complete  Medication Review (RN CM) - Complete

## 2019-05-15 ENCOUNTER — Telehealth: Payer: Self-pay | Admitting: Neurology

## 2019-05-15 NOTE — Telephone Encounter (Signed)
Megan with [employer] called requesting to review records. Reference #: [id number]

## 2019-05-15 NOTE — Progress Notes (Signed)
  Progress Note    05/15/2019 7:50 AM 7 Days Post-Op  Subjective:  Says his leg feels ok  Afebrile VSS  Vitals:   05/15/19 0402 05/15/19 0743  BP: (!) 142/76 139/81  Pulse: 77 73  Resp: 18 16  Temp: 98.6 F (37 C) 98.4 F (36.9 C)  SpO2: 98% 97%    Physical Exam: Incisions:  Clean and dry with staples in tact.     CBC    Component Value Date/Time   WBC 16.6 (H) 05/10/2019 0441   RBC 4.20 (L) 05/10/2019 0441   HGB 11.0 (L) 05/10/2019 0441   HCT 33.3 (L) 05/10/2019 0441   PLT 221 05/10/2019 0441   MCV 79.3 (L) 05/10/2019 0441   MCH 26.2 05/10/2019 0441   MCHC 33.0 05/10/2019 0441   RDW 16.4 (H) 05/10/2019 0441   LYMPHSABS 3.0 03/14/2019 1348   MONOABS 0.5 03/14/2019 1348   EOSABS 0.1 03/14/2019 1348   BASOSABS 0.0 03/14/2019 1348    BMET    Component Value Date/Time   NA 138 05/08/2019 0953   K 4.0 05/08/2019 0953   CL 107 05/08/2019 0953   CO2 21 (L) 05/08/2019 0953   GLUCOSE 93 05/08/2019 0953   BUN 16 05/08/2019 0953   CREATININE 1.61 (H) 05/08/2019 0953   CALCIUM 9.1 05/08/2019 0953   GFRNONAA 44 (L) 05/08/2019 0953   GFRAA 51 (L) 05/08/2019 0953    INR    Component Value Date/Time   INR 1.0 05/08/2019 0953     Intake/Output Summary (Last 24 hours) at 05/15/2019 0750 Last data filed at 05/15/2019 0402 Gross per 24 hour  Intake 580 ml  Output 2750 ml  Net -2170 ml     Assessment/Plan:  68 y.o. male is s/p right above knee amputation  7 Days Post-Op  -pt incision looks good with staples in tact.  Pt with stump sock on but it is wet with urine.  Please leave stump sock off to keep urine off incision.     Leontine Locket, PA-C [facility] [phone] 05/15/2019 7:50 AM

## 2019-05-15 NOTE — Progress Notes (Signed)
Called pt niece, per pt niece equipment still has not been delivered. Pt niece will keep [place] updated for when the equipment is delivered. Will continue to monitor.  Amanda Cockayne, RN

## 2019-05-16 NOTE — Progress Notes (Addendum)
  Progress Note    05/16/2019 10:21 AM 8 Days Post-Op  Subjective: No complaints this morning  Vitals:   05/15/19 2029 05/16/19 0458  BP: 138/78 115/71  Pulse: 68 76  Resp: 18 20  Temp: 98.4 F (36.9 C) 98 F (36.7 C)  SpO2: 99% 100%    Physical Exam: Awake and alert Right AKA incision is clean dry intact with staples  CBC    Component Value Date/Time   WBC 16.6 (H) 05/10/2019 0441   RBC 4.20 (L) 05/10/2019 0441   HGB 11.0 (L) 05/10/2019 0441   HCT 33.3 (L) 05/10/2019 0441   PLT 221 05/10/2019 0441   MCV 79.3 (L) 05/10/2019 0441   MCH 26.2 05/10/2019 0441   MCHC 33.0 05/10/2019 0441   RDW 16.4 (H) 05/10/2019 0441   LYMPHSABS 3.0 03/14/2019 1348   MONOABS 0.5 03/14/2019 1348   EOSABS 0.1 03/14/2019 1348   BASOSABS 0.0 03/14/2019 1348    BMET    Component Value Date/Time   NA 138 05/08/2019 0953   K 4.0 05/08/2019 0953   CL 107 05/08/2019 0953   CO2 21 (L) 05/08/2019 0953   GLUCOSE 93 05/08/2019 0953   BUN 16 05/08/2019 0953   CREATININE 1.61 (H) 05/08/2019 0953   CALCIUM 9.1 05/08/2019 0953   GFRNONAA 44 (L) 05/08/2019 0953   GFRAA 51 (L) 05/08/2019 0953    INR    Component Value Date/Time   INR 1.0 05/08/2019 0953     Intake/Output Summary (Last 24 hours) at 05/16/2019 1021 Last data filed at 05/15/2019 2059 Gross per 24 hour  Intake 460 ml  Output -  Net 460 ml     Assessment:  68 y.o. male is s/p right AKA  Plan: This point will be discharged today to SNF He is on Eliquis Follow-up for staple removal October 12   Gyasi Hazzard C. Donzetta Matters, MD [facility] [place] Office: [phone] Pager: [phone]  05/16/2019 10:21 AM

## 2019-05-16 NOTE — Progress Notes (Signed)
Per staff RN, DME was delivered to the home on 9/11 around 8 pm.  Patient is for dc today.

## 2019-06-03 NOTE — Telephone Encounter (Signed)
Sending to clinical staff for review: Okay to sign/close encounter or is further follow up needed? ° °

## 2019-06-04 NOTE — Telephone Encounter (Signed)
Rerouting to clinical pool for review, as instructed.

## 2019-06-19 ENCOUNTER — Ambulatory Visit (INDEPENDENT_AMBULATORY_CARE_PROVIDER_SITE_OTHER): Payer: Self-pay | Admitting: Physician Assistant

## 2019-06-19 ENCOUNTER — Other Ambulatory Visit: Payer: Self-pay

## 2019-06-19 ENCOUNTER — Encounter: Payer: Self-pay | Admitting: Physician Assistant

## 2019-06-19 VITALS — BP 108/71 | HR 63 | Temp 96.5°F | Resp 18 | Ht 72.0 in | Wt 166.0 lb

## 2019-06-19 DIAGNOSIS — I739 Peripheral vascular disease, unspecified: Secondary | ICD-10-CM

## 2019-06-19 NOTE — Progress Notes (Signed)
  POST OPERATIVE OFFICE NOTE    CC:  F/u for surgery nonhealing wounds of his right leg.  He does not have options for revascularization.   HPI:  This is a 68 y.o. male who is s/p Right above-knee amputation.  He denise pain in the stump, fever and chills.  His Niece is with him who cares for him at home.    No Known Allergies  Current Outpatient Medications  Medication Sig Dispense Refill  . amiodarone (PACERONE) 200 MG tablet Take 200 mg by mouth 2 (two) times daily.    [name] apixaban (ELIQUIS) 5 MG TABS tablet Take 5 mg by mouth 2 (two) times daily with a meal.    . aspirin EC 81 MG tablet Take 81 mg by mouth daily.    [name] atorvastatin (LIPITOR) 40 MG tablet Take 40 mg by mouth at bedtime.     . folic acid (FOLVITE) 1 MG tablet Take 1 mg by mouth daily.     [name] oxyCODONE-acetaminophen (PERCOCET/ROXICET) 5-325 MG tablet Take 1 tablet by mouth every 6 (six) hours as needed for moderate pain. 20 tablet 0   No current facility-administered medications for this visit.      ROS:  See HPI  Physical Exam:    Incision:  Well healed right AKA incision Extremities:   Warm to touch.  Left foot warm to touch.  Doppler DP/PT monophasic signal.  He does have dark discoloration of the tips of toes 1-5 without open wounds.  Lungs non labored breathing   Assessment/Plan:  This is a 68 y.o. male who is s/p: right AKA   Left PAD with early ischemic changes to the tips of the toes.  No open wounds, denise pain.  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  He will f/u PRN  I have asked his niece to pay close attention to foot care and keeping the skin well moisturized.  If he has a wound development he will need a left LE amputation as well.  He does not have revascularzation options on the left either according to Dr. Stephens Shire angiogram report.     Roxy Horseman PA-C [facility] [phone]  Clinic MD:  Donzetta Matters

## 2019-07-09 NOTE — Telephone Encounter (Signed)
Routing to [place] for review. Has this been taken care of yet?

## 2019-07-10 NOTE — Telephone Encounter (Signed)
Pt has a OV follow with Aquino scheduled in 30m.

## 2019-07-25 ENCOUNTER — Encounter (HOSPITAL_COMMUNITY): Payer: Self-pay | Admitting: Emergency Medicine

## 2019-07-25 ENCOUNTER — Inpatient Hospital Stay (HOSPITAL_COMMUNITY): Payer: Medicare Other

## 2019-07-25 ENCOUNTER — Inpatient Hospital Stay (HOSPITAL_COMMUNITY)
Admission: EM | Admit: 2019-07-25 | Discharge: 2019-08-04 | DRG: 854 | Disposition: A | Discharge status: Home Health | Payer: Medicare Other | Attending: Internal Medicine | Admitting: Internal Medicine

## 2019-07-25 ENCOUNTER — Other Ambulatory Visit: Payer: Self-pay

## 2019-07-25 ENCOUNTER — Emergency Department (HOSPITAL_COMMUNITY): Payer: Medicare Other

## 2019-07-25 DIAGNOSIS — Z951 Presence of aortocoronary bypass graft: Secondary | ICD-10-CM | POA: Diagnosis not present

## 2019-07-25 DIAGNOSIS — M86172 Other acute osteomyelitis, left ankle and foot: Secondary | ICD-10-CM | POA: Diagnosis present

## 2019-07-25 DIAGNOSIS — M86171 Other acute osteomyelitis, right ankle and foot: Secondary | ICD-10-CM

## 2019-07-25 DIAGNOSIS — M24562 Contracture, left knee: Secondary | ICD-10-CM | POA: Diagnosis present

## 2019-07-25 DIAGNOSIS — I119 Hypertensive heart disease without heart failure: Secondary | ICD-10-CM | POA: Diagnosis present

## 2019-07-25 DIAGNOSIS — A419 Sepsis, unspecified organism: Secondary | ICD-10-CM | POA: Diagnosis present

## 2019-07-25 DIAGNOSIS — I798 Other disorders of arteries, arterioles and capillaries in diseases classified elsewhere: Secondary | ICD-10-CM

## 2019-07-25 DIAGNOSIS — K5900 Constipation, unspecified: Secondary | ICD-10-CM | POA: Diagnosis present

## 2019-07-25 DIAGNOSIS — Z833 Family history of diabetes mellitus: Secondary | ICD-10-CM

## 2019-07-25 DIAGNOSIS — I69398 Other sequelae of cerebral infarction: Secondary | ICD-10-CM

## 2019-07-25 DIAGNOSIS — E440 Moderate protein-calorie malnutrition: Secondary | ICD-10-CM | POA: Insufficient documentation

## 2019-07-25 DIAGNOSIS — R7401 Elevation of levels of liver transaminase levels: Secondary | ICD-10-CM | POA: Diagnosis present

## 2019-07-25 DIAGNOSIS — I10 Essential (primary) hypertension: Secondary | ICD-10-CM | POA: Diagnosis not present

## 2019-07-25 DIAGNOSIS — I739 Peripheral vascular disease, unspecified: Secondary | ICD-10-CM | POA: Diagnosis not present

## 2019-07-25 DIAGNOSIS — L97509 Non-pressure chronic ulcer of other part of unspecified foot with unspecified severity: Secondary | ICD-10-CM | POA: Diagnosis present

## 2019-07-25 DIAGNOSIS — Z832 Family history of diseases of the blood and blood-forming organs and certain disorders involving the immune mechanism: Secondary | ICD-10-CM

## 2019-07-25 DIAGNOSIS — I2510 Atherosclerotic heart disease of native coronary artery without angina pectoris: Secondary | ICD-10-CM | POA: Diagnosis present

## 2019-07-25 DIAGNOSIS — E43 Unspecified severe protein-calorie malnutrition: Secondary | ICD-10-CM | POA: Diagnosis not present

## 2019-07-25 DIAGNOSIS — Z7982 Long term (current) use of aspirin: Secondary | ICD-10-CM

## 2019-07-25 DIAGNOSIS — Z87891 Personal history of nicotine dependence: Secondary | ICD-10-CM | POA: Diagnosis not present

## 2019-07-25 DIAGNOSIS — I340 Nonrheumatic mitral (valve) insufficiency: Secondary | ICD-10-CM | POA: Diagnosis not present

## 2019-07-25 DIAGNOSIS — E785 Hyperlipidemia, unspecified: Secondary | ICD-10-CM | POA: Diagnosis present

## 2019-07-25 DIAGNOSIS — I714 Abdominal aortic aneurysm, without rupture: Secondary | ICD-10-CM | POA: Diagnosis present

## 2019-07-25 DIAGNOSIS — Z993 Dependence on wheelchair: Secondary | ICD-10-CM

## 2019-07-25 DIAGNOSIS — M869 Osteomyelitis, unspecified: Secondary | ICD-10-CM | POA: Diagnosis present

## 2019-07-25 DIAGNOSIS — I70262 Atherosclerosis of native arteries of extremities with gangrene, left leg: Secondary | ICD-10-CM | POA: Diagnosis present

## 2019-07-25 DIAGNOSIS — Z20828 Contact with and (suspected) exposure to other viral communicable diseases: Secondary | ICD-10-CM | POA: Diagnosis present

## 2019-07-25 DIAGNOSIS — M21372 Foot drop, left foot: Secondary | ICD-10-CM | POA: Diagnosis present

## 2019-07-25 DIAGNOSIS — I480 Paroxysmal atrial fibrillation: Secondary | ICD-10-CM | POA: Diagnosis present

## 2019-07-25 DIAGNOSIS — Z7901 Long term (current) use of anticoagulants: Secondary | ICD-10-CM

## 2019-07-25 DIAGNOSIS — Z89611 Acquired absence of right leg above knee: Secondary | ICD-10-CM

## 2019-07-25 DIAGNOSIS — Z8249 Family history of ischemic heart disease and other diseases of the circulatory system: Secondary | ICD-10-CM | POA: Diagnosis not present

## 2019-07-25 DIAGNOSIS — M86272 Subacute osteomyelitis, left ankle and foot: Secondary | ICD-10-CM | POA: Diagnosis not present

## 2019-07-25 DIAGNOSIS — F0151 Vascular dementia with behavioral disturbance: Secondary | ICD-10-CM | POA: Diagnosis not present

## 2019-07-25 DIAGNOSIS — F0390 Unspecified dementia without behavioral disturbance: Secondary | ICD-10-CM | POA: Diagnosis present

## 2019-07-25 DIAGNOSIS — L97526 Non-pressure chronic ulcer of other part of left foot with bone involvement without evidence of necrosis: Secondary | ICD-10-CM | POA: Diagnosis present

## 2019-07-25 DIAGNOSIS — D509 Iron deficiency anemia, unspecified: Secondary | ICD-10-CM | POA: Diagnosis present

## 2019-07-25 DIAGNOSIS — Z79899 Other long term (current) drug therapy: Secondary | ICD-10-CM

## 2019-07-25 DIAGNOSIS — Z6824 Body mass index (BMI) 24.0-24.9, adult: Secondary | ICD-10-CM

## 2019-07-25 DIAGNOSIS — S78112A Complete traumatic amputation at level between left hip and knee, initial encounter: Secondary | ICD-10-CM | POA: Diagnosis not present

## 2019-07-25 LAB — COMPREHENSIVE METABOLIC PANEL
ALT: 41 U/L (ref 0–44)
AST: 63 U/L — ABNORMAL HIGH (ref 15–41)
Albumin: 2.6 g/dL — ABNORMAL LOW (ref 3.5–5.0)
Alkaline Phosphatase: 63 U/L (ref 38–126)
Anion gap: 10 (ref 5–15)
BUN: 18 mg/dL (ref 8–23)
CO2: 20 mmol/L — ABNORMAL LOW (ref 22–32)
Calcium: 9 mg/dL (ref 8.9–10.3)
Chloride: 109 mmol/L (ref 98–111)
Creatinine, Ser: 1.53 mg/dL — ABNORMAL HIGH (ref 0.61–1.24)
GFR calc Af Amer: 54 mL/min — ABNORMAL LOW (ref >60)
GFR calc non Af Amer: 46 mL/min — ABNORMAL LOW (ref >60)
Glucose, Bld: 129 mg/dL — ABNORMAL HIGH (ref 70–99)
Potassium: 4 mmol/L (ref 3.5–5.1)
Sodium: 139 mmol/L (ref 135–145)
Total Bilirubin: 0.4 mg/dL (ref 0.3–1.2)
Total Protein: 6.7 g/dL (ref 6.5–8.1)

## 2019-07-25 LAB — CBC WITH DIFFERENTIAL/PLATELET
Abs Immature Granulocytes: 0.11 10*3/uL — ABNORMAL HIGH (ref 0.00–0.07)
Basophils Absolute: 0 10*3/uL (ref 0.0–0.1)
Basophils Relative: 0 %
Eosinophils Absolute: 0 10*3/uL (ref 0.0–0.5)
Eosinophils Relative: 0 %
HCT: 41.7 % (ref 39.0–52.0)
Hemoglobin: 13.3 g/dL (ref 13.0–17.0)
Immature Granulocytes: 1 %
Lymphocytes Relative: 17 %
Lymphs Abs: 1.6 10*3/uL (ref 0.7–4.0)
MCH: 25.3 pg — ABNORMAL LOW (ref 26.0–34.0)
MCHC: 31.9 g/dL (ref 30.0–36.0)
MCV: 79.4 fL — ABNORMAL LOW (ref 80.0–100.0)
Monocytes Absolute: 0.6 10*3/uL (ref 0.1–1.0)
Monocytes Relative: 6 %
Neutro Abs: 6.9 10*3/uL (ref 1.7–7.7)
Neutrophils Relative %: 76 %
Platelets: 317 10*3/uL (ref 150–400)
RBC: 5.25 MIL/uL (ref 4.22–5.81)
RDW: 16.6 % — ABNORMAL HIGH (ref 11.5–15.5)
WBC: 9.2 10*3/uL (ref 4.0–10.5)
nRBC: 0 % (ref 0.0–0.2)

## 2019-07-25 LAB — TROPONIN I (HIGH SENSITIVITY): Troponin I (High Sensitivity): 14 ng/L (ref <18)

## 2019-07-25 LAB — LACTIC ACID, PLASMA
Lactic Acid, Venous: 3.5 mmol/L (ref 0.5–1.9)
Lactic Acid, Venous: 3.3 mmol/L (ref 0.5–1.9)

## 2019-07-25 LAB — CORTISOL: Cortisol, Plasma: 21 ug/dL

## 2019-07-25 MED ORDER — APIXABAN 5 MG PO TABS
5.0000 mg | ORAL_TABLET | Freq: Two times a day (BID) | ORAL | Status: DC
  Administered 2019-07-25 – 2019-07-26 (×2): 5 mg via ORAL
  Filled 2019-07-25 (×2): qty 1

## 2019-07-25 MED ORDER — PRO-STAT SUGAR FREE PO LIQD
30.0000 mL | Freq: Two times a day (BID) | ORAL | Status: DC
  Administered 2019-07-26 – 2019-07-27 (×3): 30 mL via ORAL
  Filled 2019-07-25 (×3): qty 30

## 2019-07-25 MED ORDER — SODIUM CHLORIDE 0.9% FLUSH: 3.0000 mL | Freq: Once | INTRAVENOUS | Status: DC

## 2019-07-25 MED ORDER — PIPERACILLIN-TAZOBACTAM 3.375 G IVPB
3.3750 g | Freq: Three times a day (TID) | INTRAVENOUS | Status: DC
  Administered 2019-07-25 – 2019-07-30 (×14): 3.375 g via INTRAVENOUS
  Filled 2019-07-25 (×15): qty 50

## 2019-07-25 MED ORDER — VANCOMYCIN HCL 10 G IV SOLR
1500.0000 mg | Freq: Once | INTRAVENOUS | Status: AC
  Administered 2019-07-25: 1500 mg via INTRAVENOUS
  Filled 2019-07-25: qty 1500

## 2019-07-25 MED ORDER — SODIUM CHLORIDE 0.9 % IV SOLN
INTRAVENOUS | Status: AC
  Administered 2019-07-25 (×2): via INTRAVENOUS

## 2019-07-25 MED ORDER — VANCOMYCIN HCL 10 G IV SOLR
1250.0000 mg | INTRAVENOUS | Status: DC
  Administered 2019-07-26 – 2019-07-28 (×3): 1250 mg via INTRAVENOUS
  Filled 2019-07-25 (×4): qty 1250

## 2019-07-25 MED ORDER — ACETAMINOPHEN 650 MG RE SUPP: 650.0000 mg | Freq: Four times a day (QID) | RECTAL | Status: DC | PRN

## 2019-07-25 MED ORDER — ACETAMINOPHEN 325 MG PO TABS: 650.0000 mg | ORAL_TABLET | Freq: Four times a day (QID) | ORAL | Status: DC | PRN

## 2019-07-25 MED ORDER — SODIUM CHLORIDE 0.9 % IV BOLUS
500.0000 mL | Freq: Once | INTRAVENOUS | Status: AC
  Administered 2019-07-25: 500 mL via INTRAVENOUS

## 2019-07-25 NOTE — Progress Notes (Signed)
[place] for apixaban Indication: atrial fibrillation  Labs: Recent Labs    07/25/19 1546  HGB 13.3  HCT 41.7  PLT 317  CREATININE 1.53*    Assessment: 42 yom on apixaban PTA for hx PAF presenting with foot wound. Pharmacy consulted to continue apixaban - last dose 11/21 0900 per patient. CBC WNL, SCr 1.53 on admit. No active bleed issues documented.  Goal of Therapy:  Stroke prevention Monitor platelets by anticoagulation protocol: Yes   Plan:  Apixaban 5mg  PO BID Monitor CBC, s/sx bleeding  Elicia Lamp, PharmD, BCPS Clinical Pharmacist 07/25/2019 8:34 PM

## 2019-07-25 NOTE — Progress Notes (Signed)
Pharmacy Antibiotic Note  [name] is a 68 y.o. male admitted on 07/25/2019 with foot ulcer.  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  Pharmacy has been consulted for vancomycin and Zosyn dosing for cellulitis/osteomyelitis.  SCr 1.53, CrCL 50 ml/min, hypothermic, WBC WNL, LA down to 3.3.  Plan: Vanc 1500mg  IV x 1, then 1250mg  IV Q24H for AUC 503 using SCr 1.53 Zosyn EID 3.375gm IV Q8H Monitor renal fxn, clinical progress, vanc AUC as indicated   Weight: 166 lb (75.3 kg)  Temp (24hrs), Avg:97.7 F (36.5 C), Min:97.4 F (36.3 C), Max:97.9 F (36.6 C)  Recent Labs  Lab 07/25/19 1546 07/25/19 1735  WBC 9.2  --   CREATININE 1.53*  --   LATICACIDVEN 3.5* 3.3*    Estimated Creatinine Clearance: 49.9 mL/min (A) (by C-G formula based on SCr of 1.53 mg/dL (H)).    No Known Allergies   Vanc 11/21 >> Zosyn 11/21 >>  11/21 covid -  11/21 wound cx (superficial) - GPR / GPC / GNR on Gram stain  [name] D. Mina Marble, PharmD, BCPS, [place] 07/25/2019, 8:14 PM

## 2019-07-25 NOTE — H&P (Addendum)
TRH H&P    Patient Demographics:    [name], is a 68 y.o. male  MRN: [id number]  DOB - [birth year]  Admit Date - 07/25/2019  Referring MD/NP/PA: Carmon Sails  Outpatient Primary MD for the patient is [name], MD  Patient coming from:  home  Chief complaint-  Foot ulcer   HPI:    [name]  is a 68 y.o. male, w dementia, hypertension, hyperlipidemia, CAD, Pafib, h/o CVA, PAD s/p R AKA 05/08/2019 apparently presents with wound on the lateral aspect of the left foot . Pt is uncertain how long has been there.  Pt denies fever, chills. Pt appears to be a poor historian.  Per RN notes niece POA noticed wound yesterday.   In ED,  T 97.9, P 81 Bp 83/64  RR 20  Pox 99% on RA Na 139, K 4.0, Bun 18,Creatinine 1.53 Alb 2.6 Ast 63, Alt 41 Wbc 9.2, hgb 13.3, Plt 317  Wound culture  Few gram positive cocci, moderate gram positive rods  Blood culture x2 pending  Lactic acid 3.5-> 3.3  Xray L foot -> IMPRESSION: 1. Subtle cortical resorption noted along the lateral base of the fifth metatarsal suspicious for osteomyelitis. 2. No other evidence of osteomyelitis.  No fracture or bone lesion. 3. Diffuse soft tissue swelling.  No soft tissue air.   Pt will be admitted for sepsis (tachypnea, hypotension, elevated lactic acid), secondary to ? osteomyelitis of the 5th mtp       Review of systems:    In addition to the HPI above,  No Fever-chills, No Headache, No changes with Vision or hearing, No problems swallowing food or Liquids, No Chest pain, Cough or Shortness of Breath, No Abdominal pain, No Nausea or Vomiting, bowel movements are regular, No Blood in stool or Urine, No dysuria,   No new joints pains-aches,  No new weakness, tingling, numbness in any extremity, No recent weight gain or loss, No polyuria, polydypsia or polyphagia, No significant Mental Stressors.  All  other systems reviewed and are negative.    Past History of the following :    Past Medical History:  Diagnosis Date   Alcoholism ([place])    Complication of anesthesia    Coronary artery disease    Dementia (HCC)    Hyperlipidemia    Hypertension    PAD (peripheral artery disease) (HCC)    non-viable tissue RLE   Paroxysmal atrial fibrillation ([place])    Pre-diabetes    Stroke [facility])       Past Surgical History:  Procedure Laterality Date   ABDOMINAL AORTOGRAM W/LOWER EXTREMITY Bilateral 03/20/2019   Procedure: ABDOMINAL AORTOGRAM W/LOWER EXTREMITY;  Surgeon: Serafina Mitchell, MD;  Location: [facility];  Service: Cardiovascular;  Laterality: Bilateral;   AMPUTATION Right 05/08/2019   Procedure: AMPUTATION ABOVE KNEE RIGHT;  Surgeon: Serafina Mitchell, MD;  Location: [facility];  Service: Vascular;  Laterality: Right;   heart bypass        Social History:      Social History  Tobacco Use   Smoking status: Former Smoker    Years: 50.00    Types: Cigarettes    Quit date: 2019    Years since quitting: 1.8   Smokeless tobacco: Never Used  Substance Use Topics   Alcohol use: Not Currently       Family History :     Family History  Problem Relation Age of Onset   Lung cancer Sister    Heart disease Brother    Diabetes Sister    Clotting disorder Sister    Heart disease Sister    Colon cancer Neg Hx    Esophageal cancer Neg Hx        Home Medications:   Prior to Admission medications   Medication Sig Start Date End Date Taking? Authorizing Provider  amiodarone (PACERONE) 200 MG tablet Take 200 mg by mouth 2 (two) times daily.   Yes [provider]  apixaban (ELIQUIS) 5 MG TABS tablet Take 5 mg by mouth 2 (two) times daily with a meal.   Yes [provider]  aspirin EC 81 MG tablet Take 81 mg by mouth daily.   Yes [provider]  atorvastatin (LIPITOR) 40 MG tablet Take 40 mg by mouth at bedtime.    Yes  [provider]  folic acid (FOLVITE) 1 MG tablet Take 1 mg by mouth daily.    Yes [provider]  Hydrocortisone (GERHARDT'S BUTT CREAM) CREA Apply 1 application topically daily.   Yes [provider]  oxyCODONE-acetaminophen (PERCOCET/ROXICET) 5-325 MG tablet Take 1 tablet by mouth every 6 (six) hours as needed for moderate pain. 05/14/19  Yes [name], PA-C     Allergies:    No Known Allergies   Physical Exam:   Vitals  Blood pressure (!) 153/73, pulse 68, temperature (!) 97.4 F (36.3 C), temperature source Oral, resp. rate 11, SpO2 96 %.  1.  General: axoxo1 (person)  2. Psychiatric: euthymic  3. Neurologic: nonfocal  4. HEENMT:  Anicteric, + arcus senilis, pupils 1.58mm symmetric, direct, consensual, near intact Neck: no jvd  5. Respiratory : CTAB  6. Cardiovascular : rrr s1, s2, no m/g/r    7. Gastrointestinal:  Abd: soft, nt, nd, +bs  8. Skin:  Ext: no c/c,  Slight edema left foot,  2.8cm open wound to the lateral aspect of the mid left foot , and there is a smaller 0.3cm wound near the ankle. onychomycosis dp pulse dopplerable  9.Musculoskeletal:  Good ROM    Data Review:    CBC Recent Labs  Lab 07/25/19 1546  WBC 9.2  HGB 13.3  HCT 41.7  PLT 317  MCV 79.4*  MCH 25.3*  MCHC 31.9  RDW 16.6*  LYMPHSABS 1.6  MONOABS 0.6  EOSABS 0.0  BASOSABS 0.0   ------------------------------------------------------------------------------------------------------------------  Results for orders placed or performed during the hospital encounter of 07/25/19 (from the past 48 hour(s))  Lactic acid, plasma     Status: Abnormal   Collection Time: 07/25/19  3:46 PM  Result Value Ref Range   Lactic Acid, Venous 3.5 (HH) 0.5 - 1.9 mmol/L    Comment: CRITICAL RESULT CALLED TO, READ BACK BY AND VERIFIED WITH: Allayne Gitelman RN AT 1630 07/25/2019 BY [facility] Performed at [facility], [place] [address], [place], [place]  [address]   Comprehensive metabolic panel     Status: Abnormal   Collection Time: 07/25/19  3:46 PM  Result Value Ref Range   Sodium 139 135 - 145  mmol/L   Potassium 4.0 3.5 - 5.1 mmol/L   Chloride 109 98 - 111 mmol/L   CO2 20 (L) 22 - 32 mmol/L   Glucose, Bld 129 (H) 70 - 99 mg/dL   BUN 18 8 - 23 mg/dL   Creatinine, Ser 1.53 (H) 0.61 - 1.24 mg/dL   Calcium 9.0 8.9 - 10.3 mg/dL   Total Protein 6.7 6.5 - 8.1 g/dL   Albumin 2.6 (L) 3.5 - 5.0 g/dL   AST 63 (H) 15 - 41 U/L   ALT 41 0 - 44 U/L   Alkaline Phosphatase 63 38 - 126 U/L   Total Bilirubin 0.4 0.3 - 1.2 mg/dL   GFR calc non Af Amer 46 (L) >60 mL/min   GFR calc Af Amer 54 (L) >60 mL/min   Anion gap 10 5 - 15    Comment: Performed at [facility] [address] [place], [place] [address]  CBC with Differential     Status: Abnormal   Collection Time: 07/25/19  3:46 PM  Result Value Ref Range   WBC 9.2 4.0 - 10.5 K/uL   RBC 5.25 4.22 - 5.81 MIL/uL   Hemoglobin 13.3 13.0 - 17.0 g/dL   HCT 41.7 39.0 - 52.0 %   MCV 79.4 (L) 80.0 - 100.0 fL   MCH 25.3 (L) 26.0 - 34.0 pg   MCHC 31.9 30.0 - 36.0 g/dL   RDW 16.6 (H) 11.5 - 15.5 %   Platelets 317 150 - 400 K/uL   nRBC 0.0 0.0 - 0.2 %   Neutrophils Relative % 76 %   Neutro Abs 6.9 1.7 - 7.7 K/uL   Lymphocytes Relative 17 %   Lymphs Abs 1.6 0.7 - 4.0 K/uL   Monocytes Relative 6 %   Monocytes Absolute 0.6 0.1 - 1.0 K/uL   Eosinophils Relative 0 %   Eosinophils Absolute 0.0 0.0 - 0.5 K/uL   Basophils Relative 0 %   Basophils Absolute 0.0 0.0 - 0.1 K/uL   Immature Granulocytes 1 %   Abs Immature Granulocytes 0.11 (H) 0.00 - 0.07 K/uL    Comment: Performed at [facility], [place] [address] [place], [place] [address]  Wound or Superficial Culture     Status: None (Preliminary result)   Collection Time: 07/25/19  4:28 PM   Specimen: Wound  Result Value Ref Range   Specimen Description WOUND    Special Requests Normal    Gram Stain      RARE WBC PRESENT, PREDOMINANTLY  PMN MODERATE GRAM POSITIVE RODS FEW GRAM POSITIVE COCCI FEW GRAM NEGATIVE RODS Performed at [facility], [place] [address] [place], [place] [address]    Culture PENDING    Report Status PENDING   Lactic acid, plasma     Status: Abnormal   Collection Time: 07/25/19  5:35 PM  Result Value Ref Range   Lactic Acid, Venous 3.3 (HH) 0.5 - 1.9 mmol/L    Comment: CRITICAL VALUE NOTED.  VALUE IS CONSISTENT WITH PREVIOUSLY REPORTED AND CALLED VALUE. Performed at [facility], [place] [address] [place], [place] [address]     Chemistries  Recent Labs  Lab 07/25/19 1546  NA 139  K 4.0  CL 109  CO2 20*  GLUCOSE 129*  BUN 18  CREATININE 1.53*  CALCIUM 9.0  AST 63*  ALT 41  ALKPHOS 63  BILITOT 0.4   ------------------------------------------------------------------------------------------------------------------  ------------------------------------------------------------------------------------------------------------------ GFR: CrCl cannot be calculated (Unknown ideal weight.). Liver Function Tests: Recent  Labs  Lab 07/25/19 1546  AST 63*  ALT 41  ALKPHOS 63  BILITOT 0.4  PROT 6.7  ALBUMIN 2.6*   No results for input(s): LIPASE, AMYLASE in the last 168 hours. No results for input(s): AMMONIA in the last 168 hours. Coagulation Profile: No results for input(s): INR, PROTIME in the last 168 hours. Cardiac Enzymes: No results for input(s): CKTOTAL, CKMB, CKMBINDEX, TROPONINI in the last 168 hours. BNP (last 3 results) No results for input(s): PROBNP in the last 8760 hours. HbA1C: No results for input(s): HGBA1C in the last 72 hours. CBG: No results for input(s): GLUCAP in the last 168 hours. Lipid Profile: No results for input(s): CHOL, HDL, LDLCALC, TRIG, CHOLHDL, LDLDIRECT in the last 72 hours. Thyroid Function Tests: No results for input(s): TSH, T4TOTAL, FREET4, T3FREE, THYROIDAB in the last 72 hours. Anemia Panel: No results for input(s): VITAMINB12,  FOLATE, FERRITIN, TIBC, IRON, RETICCTPCT in the last 72 hours.  --------------------------------------------------------------------------------------------------------------- Urine analysis: No results found for: COLORURINE, APPEARANCEUR, LABSPEC, PHURINE, GLUCOSEU, HGBUR, BILIRUBINUR, KETONESUR, PROTEINUR, UROBILINOGEN, NITRITE, LEUKOCYTESUR    Imaging Results:    Dg Ankle Complete Left  Result Date: 07/25/2019 CLINICAL DATA:  Edema of left foot and ankle. Ulcers on medial and lateral left foot. Right leg amputated EXAM: LEFT ANKLE COMPLETE - 3+ VIEW COMPARISON:  02/20/2019 FINDINGS: No fracture or bone lesion. No bone resorption to suggest osteomyelitis. Ankle joint normally spaced and aligned.  No arthropathic changes. There is diffuse soft tissue swelling. No soft tissue air. Arterial vascular calcifications are noted along the posterior tibial and dorsalis pedis arteries. IMPRESSION: 1. No fracture, ankle joint abnormality or evidence of osteomyelitis. 2. Diffuse soft tissue swelling. Electronically Signed   By: Lajean Manes M.D.   On: 07/25/2019 17:31   Dg Foot Complete Left  Result Date: 07/25/2019 CLINICAL DATA:  Edema of left foot and ankle. Ulcers on medial and lateral left foot. Right leg amputated EXAM: LEFT FOOT - COMPLETE 3+ VIEW COMPARISON:  02/20/2019 FINDINGS: There is subtle cortical resorption along the lateral base of the fifth metatarsal, which is new since the prior exam. No other areas of bone resorption.  No fracture or bone lesion. Joints are normally spaced and aligned. There is diffuse ankle soft tissue swelling, which extends to the foot. No soft tissue air. IMPRESSION: 1. Subtle cortical resorption noted along the lateral base of the fifth metatarsal suspicious for osteomyelitis. 2. No other evidence of osteomyelitis.  No fracture or bone lesion. 3. Diffuse soft tissue swelling.  No soft tissue air. Electronically Signed   By: Lajean Manes M.D.   On: 07/25/2019 17:34         Assessment & Plan:    Active Problems:   Dementia ([place])   Hyperlipidemia   Hypertension   Paroxysmal atrial fibrillation (HCC)   Foot ulcer (HCC)   Osteomyelitis (HCC)   Protein-calorie malnutrition, severe (HCC)  Sepsis (hypotension, tachypnea, lactic acidosis) secondary to ? Osteomyelitis Blood culture x2 Wound culture pending MRI left foot r/o osteomyelitis Vanco iv , Zosyn iv pharmacy to dose  Hypotension Tele 12 lead ekg Trop I q2h x2 Check cortisol Check cardiac echo   Pafib Cont Amiodarone Cont Eliquis pharmacy to dose  Hyperlipidemia, CAD Cont Lipitor 40mg  po qhs Cont Aspirin 81mg  po qday  Severe protein calorie malnutrition prostat 52mL po bid  DVT Prophylaxis-   Eliquis  AM Labs Ordered, also please review Full Orders  Family Communication: Admission, patients condition and plan of care including tests being  ordered have been discussed with the patient  who indicate understanding and agree with the plan and Code Status.  Code Status:  FULL CODE per patient, per Niece POA FULL CODE  Admission status:  Inpatient: Based on patients clinical presentation and evaluation of above clinical data, I have made determination that patient meets Inpatient criteria at this time.  Pt has high risk of clinical deterioration,  Pt has sepsis and ? Osteomyelitis and will require iv abx and fluid, and > 2 nites stay.   Time spent in minutes :  70 minutes   Jani Gravel M.D on 07/25/2019 at 8:01 PM

## 2019-07-25 NOTE — ED Notes (Signed)
Pt to xray

## 2019-07-25 NOTE — ED Notes (Signed)
Date and time results received: 07/25/19  (use smartphrase ".now" to insert current time)  Test: lactic Critical Value: 3.5  Name of Provider Notified: gibbons, PA  Orders Received? Or Actions Taken?: awaiting new orders

## 2019-07-25 NOTE — ED Triage Notes (Signed)
C/o wound to L foot that has been draining since yesterday.  Niece, POA, states she noticed it for the first time yesterday.  Pt denies pain.  States she came home today and pt was diaphoretic.

## 2019-07-25 NOTE — ED Provider Notes (Signed)
[place] EMERGENCY DEPARTMENT Provider Note   CSN: [id number] Arrival date & time: 07/25/19  1459     History   Chief Complaint Chief Complaint  Patient presents with   Wound Check    HPI [name] is a 68 y.o. male with pertinent PMH of PAD s/p right AKA, left foot chronic wounds, stroke with left-sided deficits, CAD s/p CABG, PAF on eliquis, limb ischemia, presents to ER for evaluation of left foot wound.  Patient states he does not know why he is here.  He is oriented to his full name and place but does not know the year or the month.  He can tell me he lives with his niece who helps take care of him.  Agrees he has left foot wounds that have been there for a while.  States usually they do not hurt but lately they have been painful.  There is associated new foot swelling and drainage as well.  He cannot tell me how long these changes have been there.  Per RN niece is available via phone call for additional information.  Will attempt to contact him.     HPI  Past Medical History:  Diagnosis Date   Alcoholism ([name])    Complication of anesthesia    Coronary artery disease    Dementia (HCC)    Hyperlipidemia    Hypertension    PAD (peripheral artery disease) (HCC)    non-viable tissue RLE   Paroxysmal atrial fibrillation (HCC)    Pre-diabetes    Stroke [facility])     Patient Active Problem List   Diagnosis Date Noted   Foot ulcer ([name]) 05/08/2019   Acute osteomyelitis of metatarsal bone of right foot ([name]) 04/27/2019   Critical lower limb ischemia 04/25/2019   Pressure injury of skin 03/20/2019   Osteomyelitis of right foot ([name]) 03/15/2019   Wound of right foot 03/14/2019   Coronary artery disease    Hyperlipidemia    Hypertension    Paroxysmal atrial fibrillation (HCC)    CKD (chronic kidney disease), stage IV (HCC)    Dementia ([name]) 02/10/2019    Past Surgical History:  Procedure Laterality Date   ABDOMINAL  AORTOGRAM W/LOWER EXTREMITY Bilateral 03/20/2019   Procedure: ABDOMINAL AORTOGRAM W/LOWER EXTREMITY;  Surgeon: Serafina Mitchell, MD;  Location: [place] CV LAB;  Service: Cardiovascular;  Laterality: Bilateral;   AMPUTATION Right 05/08/2019   Procedure: AMPUTATION ABOVE KNEE RIGHT;  Surgeon: Serafina Mitchell, MD;  Location: [facility] OR;  Service: Vascular;  Laterality: Right;   heart bypass          Home Medications    Prior to Admission medications   Medication Sig Start Date End Date Taking? Authorizing Provider  amiodarone (PACERONE) 200 MG tablet Take 200 mg by mouth 2 (two) times daily.   Yes [provider]  apixaban (ELIQUIS) 5 MG TABS tablet Take 5 mg by mouth 2 (two) times daily with a meal.   Yes [provider]  aspirin EC 81 MG tablet Take 81 mg by mouth daily.   Yes [provider]  atorvastatin (LIPITOR) 40 MG tablet Take 40 mg by mouth at bedtime.    Yes [provider]  folic acid (FOLVITE) 1 MG tablet Take 1 mg by mouth daily.    Yes [provider]  Hydrocortisone (GERHARDT'S BUTT CREAM) CREA Apply 1 application topically daily.   Yes [provider]  oxyCODONE-acetaminophen (PERCOCET/ROXICET) 5-325 MG tablet Take 1 tablet by mouth every  6 (six) hours as needed for moderate pain. 05/14/19  Yes [name], PA-C    Family History Family History  Problem Relation Age of Onset   Lung cancer Sister    Heart disease Brother    Diabetes Sister    Clotting disorder Sister    Heart disease Sister    Colon cancer Neg Hx    Esophageal cancer Neg Hx     Social History Social History   Tobacco Use   Smoking status: Former Smoker    Years: 50.00    Types: Cigarettes    Quit date: 2019    Years since quitting: 1.8   Smokeless tobacco: Never Used  Substance Use Topics   Alcohol use: Not Currently   Drug use: Never     Allergies   Patient has no known allergies.   Review of Systems Review of  Systems  Skin: Positive for wound.  All other systems reviewed and are negative.    Physical Exam Updated Vital Signs BP (!) 145/74 (BP Location: Right Arm)    Pulse 68    Temp (!) 97.4 F (36.3 C) (Oral)    Resp 19    SpO2 96%   Physical Exam Vitals signs and nursing note reviewed.  Constitutional:      General: He is not in acute distress.    Appearance: He is well-developed.     Comments: NAD.  HENT:     Head: Normocephalic and atraumatic.     Right Ear: External ear normal.     Left Ear: External ear normal.     Nose: Nose normal.  Eyes:     General: No scleral icterus.    Conjunctiva/sclera: Conjunctivae normal.  Neck:     Musculoskeletal: Normal range of motion and neck supple.  Cardiovascular:     Rate and Rhythm: Normal rate and regular rhythm.     Heart sounds: Normal heart sounds.     Comments:  Dopplerable left DP and PT pulses (marked) Left foot is cool Palpable left popliteal and femoral pulses Palpable right femoral pulse  Pulmonary:     Effort: Pulmonary effort is normal.     Breath sounds: Normal breath sounds.  Musculoskeletal: Normal range of motion.        General: Swelling and tenderness present.     Comments:  S/p right BKA, stump incision well-healed. Moderate edema of left midfoot.  No focal bony tenderness of the left ankle, calcaneus, or toes.  Skin:    General: Skin is warm and dry.     Capillary Refill: Capillary refill takes less than 2 seconds.     Comments:  Hyperpigmented, scaly and thickened skin in the left lower extremity and foot.  Thickened and hyperpigmented toenails.   There are 3 wounds to the left lateral mid foot and heel.   These measure approximately 0.5 cm X0 0.5 cm, 1X 1 cm, 3X 4 cm.  Largest wound is oozing serosanguineous fluid.  Neurological:     Mental Status: He is alert and oriented to person, place, and time.  Psychiatric:        Behavior: Behavior normal.        Thought Content: Thought content normal.         Judgment: Judgment normal.       ED Treatments / Results  Labs (all labs ordered are listed, but only abnormal results are displayed) Labs Reviewed  LACTIC ACID, PLASMA - Abnormal; Notable for the following components:  Result Value   Lactic Acid, Venous 3.5 (*)    All other components within normal limits  LACTIC ACID, PLASMA - Abnormal; Notable for the following components:   Lactic Acid, Venous 3.3 (*)    All other components within normal limits  COMPREHENSIVE METABOLIC PANEL - Abnormal; Notable for the following components:   CO2 20 (*)    Glucose, Bld 129 (*)    Creatinine, Ser 1.53 (*)    Albumin 2.6 (*)    AST 63 (*)    GFR calc non Af Amer 46 (*)    GFR calc Af Amer 54 (*)    All other components within normal limits  CBC WITH DIFFERENTIAL/PLATELET - Abnormal; Notable for the following components:   MCV 79.4 (*)    MCH 25.3 (*)    RDW 16.6 (*)    Abs Immature Granulocytes 0.11 (*)    All other components within normal limits  AEROBIC CULTURE (SUPERFICIAL SPECIMEN)  URINALYSIS, ROUTINE W REFLEX MICROSCOPIC    EKG None  Radiology Dg Ankle Complete Left  Result Date: 07/25/2019 CLINICAL DATA:  Edema of left foot and ankle. Ulcers on medial and lateral left foot. Right leg amputated EXAM: LEFT ANKLE COMPLETE - 3+ VIEW COMPARISON:  02/20/2019 FINDINGS: No fracture or bone lesion. No bone resorption to suggest osteomyelitis. Ankle joint normally spaced and aligned.  No arthropathic changes. There is diffuse soft tissue swelling. No soft tissue air. Arterial vascular calcifications are noted along the posterior tibial and dorsalis pedis arteries. IMPRESSION: 1. No fracture, ankle joint abnormality or evidence of osteomyelitis. 2. Diffuse soft tissue swelling. Electronically Signed   By: Lajean Manes M.D.   On: 07/25/2019 17:31   Dg Foot Complete Left  Result Date: 07/25/2019 CLINICAL DATA:  Edema of left foot and ankle. Ulcers on medial and lateral left foot.  Right leg amputated EXAM: LEFT FOOT - COMPLETE 3+ VIEW COMPARISON:  02/20/2019 FINDINGS: There is subtle cortical resorption along the lateral base of the fifth metatarsal, which is new since the prior exam. No other areas of bone resorption.  No fracture or bone lesion. Joints are normally spaced and aligned. There is diffuse ankle soft tissue swelling, which extends to the foot. No soft tissue air. IMPRESSION: 1. Subtle cortical resorption noted along the lateral base of the fifth metatarsal suspicious for osteomyelitis. 2. No other evidence of osteomyelitis.  No fracture or bone lesion. 3. Diffuse soft tissue swelling.  No soft tissue air. Electronically Signed   By: Lajean Manes M.D.   On: 07/25/2019 17:34    Procedures .Critical Care Performed by: Kinnie Feil, PA-C Authorized by: Kinnie Feil, PA-C   Critical care provider statement:    Critical care time (minutes):  45   Critical care was necessary to treat or prevent imminent or life-threatening deterioration of the following conditions: osteomyelitis limb threatening.   Critical care was time spent personally by me on the following activities:  Discussions with consultants, evaluation of patient's response to treatment, examination of patient, ordering and performing treatments and interventions, ordering and review of laboratory studies, ordering and review of radiographic studies, pulse oximetry, re-evaluation of patient's condition, obtaining history from patient or surrogate, review of old charts and development of treatment plan with patient or surrogate   I assumed direction of critical care for this patient from another provider in my specialty: no     (including critical care time)  Medications Ordered in ED Medications  sodium chloride flush (NS) 0.9 %  injection 3 mL (3 mLs Intravenous Not Given 07/25/19 1732)  sodium chloride 0.9 % bolus 500 mL (500 mLs Intravenous New Bag/Given 07/25/19 1859)     Initial  Impression / Assessment and Plan / ED Course  I have reviewed the triage vital signs and the nursing notes.  Pertinent labs & imaging results that were available during my care of the patient were reviewed by me and considered in my medical decision making (see chart for details).  Clinical Course as of Jul 24 1918  Sat Jul 25, 2019  Vernelle Emerald Previous was 3.5, no fever, leukocytosis. BP normal. Will give small IVF bolus   Lactic Acid, Venous(!!): 3.3 [CG]  1822 Creatinine(!): 1.53 [CG]  1823 IMPRESSION: 1. Subtle cortical resorption noted along the lateral base of the fifth metatarsal suspicious for osteomyelitis. 2. No other evidence of osteomyelitis. No fracture or bone lesion. 3. Diffuse soft tissue swelling. No soft tissue air.  DG Foot Complete Left [CG]    Clinical Course User Index [CG] Kinnie Feil, PA-C   EMR reviewed.  Patient had ischemic changes in LLE 06/2019 but no wounds.  Vascular surgery considering amputation of LLE if worsening skin/wounds.   Exam reveals edematous, oozing wounds in the left lower extremity.  Questionably tender, patient states he usually cannot feel his left foot.    DDX includes cellulitis or possibly osteomyelitis.  No signs of abscess clinically.  Foot is cool to the touch but has dopplerable pulses.  Acute ischemic limb less likely.   ER work-up interpreted by me, remarkable as above.  Lactic acidosis noted but no fever, white count, hypotension.  No other clinical signs of sepsis/SIRS.  X-ray reveals findings suspicious for osteomyelitis.  Patient reevaluated, no clinical decline.  Spoke to Dr. Maudie Mercury who will admit patient.  Final Clinical Impressions(s) / ED Diagnoses   Final diagnoses:  Osteomyelitis of left foot, unspecified type [facility])    ED Discharge Orders    None       Kinnie Feil, PA-C 07/25/19 [place], Ankit, MD 07/28/19 1759

## 2019-07-26 ENCOUNTER — Inpatient Hospital Stay (HOSPITAL_COMMUNITY): Payer: Medicare Other

## 2019-07-26 DIAGNOSIS — I340 Nonrheumatic mitral (valve) insufficiency: Secondary | ICD-10-CM

## 2019-07-26 DIAGNOSIS — I10 Essential (primary) hypertension: Secondary | ICD-10-CM

## 2019-07-26 LAB — COMPREHENSIVE METABOLIC PANEL
ALT: 42 U/L (ref 0–44)
AST: 70 U/L — ABNORMAL HIGH (ref 15–41)
Albumin: 2.3 g/dL — ABNORMAL LOW (ref 3.5–5.0)
Alkaline Phosphatase: 56 U/L (ref 38–126)
Anion gap: 9 (ref 5–15)
BUN: 16 mg/dL (ref 8–23)
CO2: 20 mmol/L — ABNORMAL LOW (ref 22–32)
Calcium: 8.8 mg/dL — ABNORMAL LOW (ref 8.9–10.3)
Chloride: 110 mmol/L (ref 98–111)
Creatinine, Ser: 1.14 mg/dL (ref 0.61–1.24)
GFR calc Af Amer: >60 mL/min (ref >60)
GFR calc non Af Amer: >60 mL/min (ref >60)
Glucose, Bld: 86 mg/dL (ref 70–99)
Potassium: 3.8 mmol/L (ref 3.5–5.1)
Sodium: 139 mmol/L (ref 135–145)
Total Bilirubin: 0.2 mg/dL — ABNORMAL LOW (ref 0.3–1.2)
Total Protein: 6.4 g/dL — ABNORMAL LOW (ref 6.5–8.1)

## 2019-07-26 LAB — URINALYSIS, ROUTINE W REFLEX MICROSCOPIC
Bilirubin Urine: NEGATIVE
Glucose, UA: NEGATIVE mg/dL
Hgb urine dipstick: NEGATIVE
Ketones, ur: NEGATIVE mg/dL
Leukocytes,Ua: NEGATIVE
Nitrite: NEGATIVE
Protein, ur: NEGATIVE mg/dL
Specific Gravity, Urine: 1.035 — ABNORMAL HIGH (ref 1.005–1.030)
pH: 5 (ref 5.0–8.0)

## 2019-07-26 LAB — PROCALCITONIN: Procalcitonin: 0.23 ng/mL

## 2019-07-26 LAB — HEPATITIS PANEL, ACUTE
HCV Ab: REACTIVE — AB
Hep A IgM: NONREACTIVE
Hep B C IgM: NONREACTIVE
Hepatitis B Surface Ag: NONREACTIVE

## 2019-07-26 LAB — CBC
HCT: 40.4 % (ref 39.0–52.0)
Hemoglobin: 13.1 g/dL (ref 13.0–17.0)
MCH: 25.5 pg — ABNORMAL LOW (ref 26.0–34.0)
MCHC: 32.4 g/dL (ref 30.0–36.0)
MCV: 78.6 fL — ABNORMAL LOW (ref 80.0–100.0)
Platelets: 302 10*3/uL (ref 150–400)
RBC: 5.14 MIL/uL (ref 4.22–5.81)
RDW: 16.3 % — ABNORMAL HIGH (ref 11.5–15.5)
WBC: 8.7 10*3/uL (ref 4.0–10.5)
nRBC: 0 % (ref 0.0–0.2)

## 2019-07-26 LAB — ECHOCARDIOGRAM COMPLETE
Height: 66 in
Weight: 2656 oz

## 2019-07-26 LAB — TROPONIN I (HIGH SENSITIVITY): Troponin I (High Sensitivity): 17 ng/L (ref <18)

## 2019-07-26 LAB — GLUCOSE, CAPILLARY: Glucose-Capillary: 97 mg/dL (ref 70–99)

## 2019-07-26 LAB — SARS CORONAVIRUS 2 (TAT 6-24 HRS): SARS Coronavirus 2: NEGATIVE

## 2019-07-26 LAB — LACTIC ACID, PLASMA: Lactic Acid, Venous: 1.9 mmol/L (ref 0.5–1.9)

## 2019-07-26 MED ORDER — HYDRALAZINE HCL 25 MG PO TABS
25.0000 mg | ORAL_TABLET | Freq: Three times a day (TID) | ORAL | Status: DC
  Administered 2019-07-26 – 2019-08-02 (×15): 25 mg via ORAL
  Filled 2019-07-26 (×19): qty 1

## 2019-07-26 MED ORDER — GERHARDT'S BUTT CREAM
1.0000 "application " | TOPICAL_CREAM | Freq: Every day | CUTANEOUS | Status: DC
  Administered 2019-07-26 – 2019-08-04 (×9): 1 via TOPICAL
  Filled 2019-07-26 (×2): qty 1

## 2019-07-26 MED ORDER — GADOBUTROL 1 MMOL/ML IV SOLN
7.0000 mL | Freq: Once | INTRAVENOUS | Status: AC | PRN
  Administered 2019-07-26: 7 mL via INTRAVENOUS

## 2019-07-26 MED ORDER — ASPIRIN EC 81 MG PO TBEC
81.0000 mg | DELAYED_RELEASE_TABLET | Freq: Every day | ORAL | Status: DC
  Administered 2019-07-26 – 2019-08-04 (×9): 81 mg via ORAL
  Filled 2019-07-26 (×10): qty 1

## 2019-07-26 MED ORDER — ATORVASTATIN CALCIUM 40 MG PO TABS
40.0000 mg | ORAL_TABLET | Freq: Every day | ORAL | Status: DC
  Administered 2019-07-26 – 2019-08-03 (×9): 40 mg via ORAL
  Filled 2019-07-26 (×9): qty 1

## 2019-07-26 MED ORDER — HEPARIN SODIUM (PORCINE) 5000 UNIT/ML IJ SOLN
5000.0000 [IU] | Freq: Three times a day (TID) | INTRAMUSCULAR | Status: DC
  Administered 2019-07-26 – 2019-07-30 (×12): 5000 [IU] via SUBCUTANEOUS
  Filled 2019-07-26 (×13): qty 1

## 2019-07-26 MED ORDER — FOLIC ACID 1 MG PO TABS
1.0000 mg | ORAL_TABLET | Freq: Every day | ORAL | Status: DC
  Administered 2019-07-26 – 2019-08-04 (×9): 1 mg via ORAL
  Filled 2019-07-26 (×9): qty 1

## 2019-07-26 MED ORDER — OXYCODONE-ACETAMINOPHEN 5-325 MG PO TABS
1.0000 | ORAL_TABLET | Freq: Four times a day (QID) | ORAL | Status: DC | PRN
  Administered 2019-07-26 – 2019-07-31 (×2): 1 via ORAL
  Filled 2019-07-26 (×2): qty 1

## 2019-07-26 MED ORDER — AMIODARONE HCL 200 MG PO TABS
200.0000 mg | ORAL_TABLET | Freq: Two times a day (BID) | ORAL | Status: DC
  Administered 2019-07-26 – 2019-08-04 (×19): 200 mg via ORAL
  Filled 2019-07-26 (×19): qty 1

## 2019-07-26 NOTE — Progress Notes (Signed)
  Echocardiogram 2D Echocardiogram has been performed.  [name] 07/26/2019, 11:13 AM

## 2019-07-26 NOTE — Plan of Care (Signed)
  Problem: Safety: Goal: Ability to remain free from injury will improve Outcome: Progressing   

## 2019-07-26 NOTE — Progress Notes (Signed)
Patient's niece and [name], [name], has been notified that patient is in [id number] via telephone. She is aware of the visiting hours are 10am to 8pm. Patient is resting in bed with no complaints.

## 2019-07-26 NOTE — Progress Notes (Addendum)
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  X-ray of the left foot show signs suspicious for osteomyelitis it was followed with an MRI of the left foot.  It shows Soft tissue ulceration at the lateral aspect of the foot at the level of the fifth metatarsal base with underlying acute osteomyelitis of the lateral aspect of the fifth metatarsal base.  Area of non-enhancement of the soft tissues surrounding the ulceration concerning for tissue necrosis. Thickening and irregularity of the peroneus brevis tendon at its insertion on the fifth metatarsal base, likely reactive.  Marked soft tissue swelling of the dorsum of the forefoot.  Orthopedics consulted and Eliquis held for possible amputation in the next 24 to 48 hours.    Assessment & Plan:   Active Problems:   Dementia ([place])   Hyperlipidemia   Hypertension   Paroxysmal atrial fibrillation (H[facility])   Foot ulcer (HCC)   Osteomyelitis (HCC)   Protein-calorie malnutrition, severe ([place])  Sepsis probably secondary to osteomyelitis. Sepsis pathology improving, lactic acid normalized, blood pressure improved, tachycardia improved. Osteomyelitis of the fifth metatarsal bone on the left Patient was started on IV vancomycin and IV Zosyn. Orthopedics consulted, Dr. Sharol Given to see the patient tomorrow. Pain controlled.   History of CVA Continue with aspirin and Lipitor 40 mg daily   Essential hypertension Suboptimally controlled, started the patient on hydralazine 25 mg every 8 hours.   Severe protein calorie malnutrition Patient is currently on feeding supplements. We will plan for nutrition  consult.   History of dementia Patient is not agitated at this time.  Continue to monitor    Paroxysmal atrial fibrillation Rate control with amiodarone.  On Eliquis for anticoagulation which is on hold for the procedure.  Echocardiogram done today shows Left ventricular ejection fraction, by visual estimation, is 60 to 65%. The left ventricle has normal function. There is mildly increased left ventricular hypertrophy. Global right ventricle has normal systolic function.The right ventricular size is normal. No increase in right ventricular wall thickness.   Positive HCV antibody HCV RNA quantitative ordered.  Elevated AST at 70, normal alk phos and normal ALT.  Bilirubin at 0.2 liver is grossly normal   On ultrasound incidental finding of Abdominal aortic diameter of 2.8 cm. Ectatic abdominal aorta at risk for aneurysm development. Recommend followup by ultrasound in 5 years.    H/o PVD;  Pt had undergone aortogram with lower extremity in 03/2019 by Dr Trula Slade  Showing Bilateral superficial femoral artery occlusions without reconstitution of a named distal vessel.  The patient is not a candidate for revascularization either open or percutaneous.  DVT prophylaxis: heparin.  Code Status: full code.  Family Communication: none at bedside.  Disposition Plan: pending further evaluation by orthopedics.    Consultants:   Orthopedics Dr Sharol Given  Procedures:  Echo Left ventricular ejection fraction, by visual estimation, is 60 to 65%. The left ventricle has normal function. There is mildly increased left ventricular hypertrophy. Global right ventricle has normal systolic function.The right ventricular size is normal. No increase in right ventricular wall thickness   Antimicrobials: (vancomycin and zosyn since admission.   Subjective: Worsening pain in the left foot.   Objective: Vitals:   07/26/19 0209 07/26/19  0527 07/26/19 0738 07/26/19 1322  BP: (!) 171/79 (!) 161/78 (!) 160/69  115/64  Pulse: 70 66 63 83  Resp: '16 16 20 20  ' Temp: 98.4 F (36.9 C) 98.4 F (36.9 C) 98.9 F (37.2 C) 98.9 F (37.2 C)  TempSrc: Oral Oral Oral Oral  SpO2: 100% 99% 100% 100%  Weight:      Height: '5\' 6"'  (1.676 m)       Intake/Output Summary (Last 24 hours) at 07/26/2019 1345 Last data filed at 07/26/2019 1155 Gross per 24 hour  Intake 2243.86 ml  Output --  Net 2243.86 ml   Filed Weights   07/25/19 2000  Weight: 75.3 kg    Examination:  General exam: Appears calm and comfortable  Respiratory system: Clear to auscultation. Respiratory effort normal. Cardiovascular system: S1 & S2 heard, RRR. No JVD,  Gastrointestinal system: Abdomen is nondistended, soft and nontender.  Normal bowel sounds heard. Central nervous system: alert but confused, non focal  Extremities:  Right AKI. Left foot ulcer.  Skin: left foot ulcer.  Psychiatry:  Mood is appropriate.     Data Reviewed: I have personally reviewed following labs and imaging studies  CBC: Recent Labs  Lab 07/25/19 1546 07/26/19 0347  WBC 9.2 8.7  NEUTROABS 6.9  --   HGB 13.3 13.1  HCT 41.7 40.4  MCV 79.4* 78.6*  PLT 317 505   Basic Metabolic Panel: Recent Labs  Lab 07/25/19 1546 07/26/19 0347  NA 139 139  K 4.0 3.8  CL 109 110  CO2 20* 20*  GLUCOSE 129* 86  BUN 18 16  CREATININE 1.53* 1.14  CALCIUM 9.0 8.8*   GFR: Estimated Creatinine Clearance: 56.7 mL/min (by C-G formula based on SCr of 1.14 mg/dL). Liver Function Tests: Recent Labs  Lab 07/25/19 1546 07/26/19 0347  AST 63* 70*  ALT 41 42  ALKPHOS 63 56  BILITOT 0.4 0.2*  PROT 6.7 6.4*  ALBUMIN 2.6* 2.3*   No results for input(s): LIPASE, AMYLASE in the last 168 hours. No results for input(s): AMMONIA in the last 168 hours. Coagulation Profile: No results for input(s): INR, PROTIME in the last 168 hours. Cardiac Enzymes: No results for input(s): CKTOTAL, CKMB, CKMBINDEX, TROPONINI in the last 168 hours. BNP (last 3 results) No  results for input(s): PROBNP in the last 8760 hours. HbA1C: No results for input(s): HGBA1C in the last 72 hours. CBG: No results for input(s): GLUCAP in the last 168 hours. Lipid Profile: No results for input(s): CHOL, HDL, LDLCALC, TRIG, CHOLHDL, LDLDIRECT in the last 72 hours. Thyroid Function Tests: No results for input(s): TSH, T4TOTAL, FREET4, T3FREE, THYROIDAB in the last 72 hours. Anemia Panel: No results for input(s): VITAMINB12, FOLATE, FERRITIN, TIBC, IRON, RETICCTPCT in the last 72 hours. Sepsis Labs: Recent Labs  Lab 07/25/19 1546 07/25/19 1735 07/26/19 1233  LATICACIDVEN 3.5* 3.3* 1.9    Recent Results (from the past 240 hour(s))  Wound or Superficial Culture     Status: None (Preliminary result)   Collection Time: 07/25/19  4:28 PM   Specimen: Wound  Result Value Ref Range Status   Specimen Description WOUND  Final   Special Requests Normal  Final   Gram Stain   Final    RARE WBC PRESENT, PREDOMINANTLY PMN MODERATE GRAM POSITIVE RODS FEW GRAM POSITIVE COCCI FEW GRAM NEGATIVE RODS    Culture   Final    CULTURE REINCUBATED FOR BETTER GROWTH Performed at [facility], [address], [place], [place] [address]  Report Status PENDING  Incomplete  SARS CORONAVIRUS 2 (TAT 6-24 HRS) Nasopharyngeal Nasopharyngeal Swab     Status: None   Collection Time: 07/25/19  8:38 PM   Specimen: Nasopharyngeal Swab  Result Value Ref Range Status   SARS Coronavirus 2 NEGATIVE NEGATIVE Final    Comment: (NOTE) SARS-CoV-2 target nucleic acids are NOT DETECTED. The SARS-CoV-2 RNA is generally detectable in upper and lower respiratory specimens during the acute phase of infection. Negative results do not preclude SARS-CoV-2 infection, do not rule out co-infections with other pathogens, and should not be used as the sole basis for treatment or other patient management decisions. Negative results must be combined with clinical observations, patient history, and  epidemiological information. The expected result is Negative. Fact Sheet for Patients: SugarRoll.be Fact Sheet for Healthcare Providers: https://www.woods-mathews.com/ This test is not yet approved or cleared by the [place] FDA and  has been authorized for detection and/or diagnosis of SARS-CoV-2 by FDA under an Emergency Use Authorization (EUA). This EUA will remain  in effect (meaning this test can be used) for the duration of the COVID-19 declaration under Section 56 4(b)(1) of the Act, 21 U.S.C. section 360bbb-3(b)(1), unless the authorization is terminated or revoked sooner. Performed at [facility], [place] [address] [place], [place] [address]          Radiology Studies: Dg Ankle Complete Left  Result Date: 07/25/2019 CLINICAL DATA:  Edema of left foot and ankle. Ulcers on medial and lateral left foot. Right leg amputated EXAM: LEFT ANKLE COMPLETE - 3+ VIEW COMPARISON:  02/20/2019 FINDINGS: No fracture or bone lesion. No bone resorption to suggest osteomyelitis. Ankle joint normally spaced and aligned.  No arthropathic changes. There is diffuse soft tissue swelling. No soft tissue air. Arterial vascular calcifications are noted along the posterior tibial and dorsalis pedis arteries. IMPRESSION: 1. No fracture, ankle joint abnormality or evidence of osteomyelitis. 2. Diffuse soft tissue swelling. Electronically Signed   By: Lajean Manes M.D.   On: 07/25/2019 17:31   US Abdomen Complete  Result Date: 07/25/2019 CLINICAL DATA:  Abnormal LFT and creatinine EXAM: ABDOMEN ULTRASOUND COMPLETE COMPARISON:  None. FINDINGS: Gallbladder: No gallstones or wall thickening visualized. No sonographic Murphy sign noted by sonographer. Common bile duct: Diameter: 1.7 mm Liver: No focal lesion identified. Within normal limits in parenchymal echogenicity. Portal vein is patent on color Doppler imaging with normal direction of blood flow towards  the liver. IVC: No abnormality visualized. Limited visualization due to bowel gas. Pancreas: Visualized portion unremarkable. Limited visualization due to bowel gas. Spleen: Size and appearance within normal limits. Right Kidney: Length: 9.4 cm. Echogenicity within normal limits. No mass or hydronephrosis visualized. Left Kidney: Length: 8.6 cm. Echogenicity within normal limits. No mass or hydronephrosis visualized. Abdominal aorta: No aneurysm visualized.  Maximum diameter of 2.8 cm Other findings: None. IMPRESSION: 1. Negative for gallstones or biliary dilatation. Liver is grossly unremarkable. 2. Abdominal aortic diameter of 2.8 cm. Ectatic abdominal aorta at risk for aneurysm development. Recommend followup by ultrasound in 5 years. This recommendation follows ACR consensus guidelines: White Paper of the ACR Incidental Findings Committee II on Vascular Findings. J Am Coll Radiol 2013; 10:789-794. Electronically Signed   By: Donavan Foil M.D.   On: 07/25/2019 20:27   Mr Foot Left W Wo Contrast  Result Date: 07/26/2019 CLINICAL DATA:  Left foot pain and swelling. Abnormal x-ray EXAM: MRI OF THE LEFT FOREFOOT WITHOUT AND WITH CONTRAST TECHNIQUE: Multiplanar, multisequence MR imaging of the left  forefoot was performed both before and after administration of intravenous contrast. CONTRAST:  58m GADAVIST GADOBUTROL 1 MMOL/ML IV SOLN COMPARISON:  X-ray 07/25/2019 FINDINGS: Technical note: Examination quality is degraded by patient's inability to follow commands, positioning, and motion artifact. Best possible images were obtained. Bones/Joint/Cartilage Marrow edema and enhancement within the lateral aspect of the fifth metatarsal base with associated confluent low T1 signal changes consistent with acute osteomyelitis (series 4, image 14). There is poor definition of the overlying cortex suggesting associated cortical destruction. Bone marrow signal abnormality extends distally to the level of the proximal  metaphysis but does not extend into the fifth metatarsal diaphysis. Preservation of the fatty marrow signal within the opposing cuboid. No additional areas of marrow replacement are identified within the forefoot. No fractures. Ligaments The intrinsic foot ligaments including the Lisfranc ligament are intact. Muscles and Tendons Diffuse muscular atrophy in fatty infiltration with mild associated intramuscular edema. The peroneus brevis tendon appears thickened and irregular at its insertion on the fifth metatarsal base. Flexor and extensor tendons appear otherwise intact. No tenosynovitis. Soft tissues Soft tissue ulceration at the lateral aspect of the foot at the level of the fifth metatarsal base with hypoenhancement of the cutaneous and subcutaneous soft tissues involving an approximately 3.3 x 0.8 x 3.3 cm area (series 9, image 37; series 10, image 18) concerning for tissue necrosis. No soft tissue fluid collections. There is marked soft tissue swelling of the dorsum of the forefoot. IMPRESSION: 1. Soft tissue ulceration at the lateral aspect of the foot at the level of the fifth metatarsal base with underlying acute osteomyelitis of the lateral aspect of the fifth metatarsal base. 2. Area of non-enhancement of the soft tissues surrounding the ulceration concerning for tissue necrosis. 3. Thickening and irregularity of the peroneus brevis tendon at its insertion on the fifth metatarsal base, likely reactive. 4. Marked soft tissue swelling of the dorsum of the forefoot. These results will be called to the ordering clinician or representative by the Radiologist Assistant, and communication documented in the PACS or zVision Dashboard. Electronically Signed   By: NDavina PokeM.D.   On: 07/26/2019 10:41   Dg Foot Complete Left  Result Date: 07/25/2019 CLINICAL DATA:  Edema of left foot and ankle. Ulcers on medial and lateral left foot. Right leg amputated EXAM: LEFT FOOT - COMPLETE 3+ VIEW COMPARISON:   02/20/2019 FINDINGS: There is subtle cortical resorption along the lateral base of the fifth metatarsal, which is new since the prior exam. No other areas of bone resorption.  No fracture or bone lesion. Joints are normally spaced and aligned. There is diffuse ankle soft tissue swelling, which extends to the foot. No soft tissue air. IMPRESSION: 1. Subtle cortical resorption noted along the lateral base of the fifth metatarsal suspicious for osteomyelitis. 2. No other evidence of osteomyelitis.  No fracture or bone lesion. 3. Diffuse soft tissue swelling.  No soft tissue air. Electronically Signed   By: DLajean ManesM.D.   On: 07/25/2019 17:34        Scheduled Meds:  amiodarone  200 mg Oral BID   aspirin EC  81 mg Oral Daily   atorvastatin  40 mg Oral QHS   feeding supplement (PRO-STAT SUGAR FREE 64)  30 mL Oral BID   folic acid  1 mg Oral Daily   Gerhardt's butt cream  1 application Topical Daily   hydrALAZINE  25 mg Oral Q8H   sodium chloride flush  3 mL Intravenous Once  Continuous Infusions:  piperacillin-tazobactam (ZOSYN)  IV 3.375 g (07/26/19 1246)   vancomycin       LOS: 1 day        Hosie Poisson, MD [facility] 07/26/2019, 1:45 PM

## 2019-07-26 NOTE — ED Notes (Signed)
ED TO INPATIENT HANDOFF REPORT  ED Nurse Name and Phone #: Lovell Sheehan [phone]  S Name/Age/Gender [name] 68 y.o. male Room/Bed: 026C/026C  Code Status   Code Status: Full Code  Home/SNF/Other Home Patient oriented to: self and place Is this baseline? Yes   Triage Complete: Triage complete  Chief Complaint bleeding sores on lf foot  Triage Note C/o wound to L foot that has been draining since yesterday.  Niece, POA, states she noticed it for the first time yesterday.  Pt denies pain.  States she came home today and pt was diaphoretic.     Allergies No Known Allergies  Level of Care/Admitting Diagnosis ED Disposition    ED Disposition Condition [facility] Area: [place] [[id number]]  Level of Care: Telemetry Medical [104]  Covid Evaluation: Asymptomatic Screening Protocol (No Symptoms)  Diagnosis: [facility]omyelitis [id number][id number]  Admitting Physician: Jani Gravel [phone]  Attending Physician: Jani Gravel [phone]  Estimated length of stay: past midnight tomorrow  Certification:: I certify this patient will need inpatient services for at least 2 midnights  PT Class (Do Not Modify): Inpatient [101]  PT Acc Code (Do Not Modify): Private [1]       B Medical/Surgery History Past Medical History:  Diagnosis Date  . Alcoholism ([place])   . Complication of anesthesia   . Coronary artery disease   . Dementia ([place])   . Hyperlipidemia   . Hypertension   . PAD (peripheral artery disease) ([facility])    non-viable tissue RLE  . Paroxysmal atrial fibrillation ([facility])   . Pre-diabetes   . Stroke [facility])    Past Surgical History:  Procedure Laterality Date  . ABDOMINAL AORTOGRAM W/LOWER EXTREMITY Bilateral 03/20/2019   Procedure: ABDOMINAL AORTOGRAM W/LOWER EXTREMITY;  Surgeon: Serafina Mitchell, MD;  Location: [place] CV LAB;  Service: Cardiovascular;  Laterality: Bilateral;  . AMPUTATION Right 05/08/2019   Procedure: AMPUTATION ABOVE KNEE RIGHT;   Surgeon: Serafina Mitchell, MD;  Location: [facility] OR;  Service: Vascular;  Laterality: Right;  . heart bypass       A IV Location/Drains/Wounds Patient Lines/Drains/Airways Status   Active Line/Drains/Airways    Name:   Placement date:   Placement time:   Site:   Days:   Peripheral IV 07/25/19 Left Antecubital   07/25/19    1858    Antecubital   1   External Urinary Catheter   03/15/19    0937    -   133   Incision (Closed) 05/08/19 Leg Right   05/08/19    1332     79   Pressure Injury 03/15/19 Buttocks Stage II -  Partial thickness loss of dermis presenting as a shallow open ulcer with a red, pink wound bed without slough.   03/15/19    0940     133   Pressure Injury 03/15/19 Foot Right Unstageable - Full thickness tissue loss in which the base of the ulcer is covered by slough (yellow, tan, gray, green or brown) and/or eschar (tan, brown or black) in the wound bed.   03/15/19    0941     133   Wound / Incision (Open or Dehisced) 05/16/19 Buttocks Right;Left;Mid Stage 2 redden area right and left mid buttocks   05/16/19    0147    Buttocks   71          Intake/Output Last 24 hours  Intake/Output Summary (Last 24 hours) at 07/26/2019 0115 Last data filed at  07/25/2019 2000 Gross per 24 hour  Intake 500 ml  Output -  Net 500 ml    Labs/Imaging Results for orders placed or performed during the hospital encounter of 07/25/19 (from the past 48 hour(s))  Lactic acid, plasma     Status: Abnormal   Collection Time: 07/25/19  3:46 PM  Result Value Ref Range   Lactic Acid, Venous 3.5 (HH) 0.5 - 1.9 mmol/L    Comment: CRITICAL RESULT CALLED TO, READ BACK BY AND VERIFIED WITH: Allayne Gitelman RN AT 1630 07/25/2019 BY [facility] Performed at [facility], [place] [address] [place], [place] [address]   Comprehensive metabolic panel     Status: Abnormal   Collection Time: 07/25/19  3:46 PM  Result Value Ref Range   Sodium 139 135 - 145 mmol/L   Potassium 4.0 3.5 - 5.1 mmol/L   Chloride  109 98 - 111 mmol/L   CO2 20 (L) 22 - 32 mmol/L   Glucose, Bld 129 (H) 70 - 99 mg/dL   BUN 18 8 - 23 mg/dL   Creatinine, Ser 1.53 (H) 0.61 - 1.24 mg/dL   Calcium 9.0 8.9 - 10.3 mg/dL   Total Protein 6.7 6.5 - 8.1 g/dL   Albumin 2.6 (L) 3.5 - 5.0 g/dL   AST 63 (H) 15 - 41 U/L   ALT 41 0 - 44 U/L   Alkaline Phosphatase 63 38 - 126 U/L   Total Bilirubin 0.4 0.3 - 1.2 mg/dL   GFR calc non Af Amer 46 (L) >60 mL/min   GFR calc Af Amer 54 (L) >60 mL/min   Anion gap 10 5 - 15    Comment: Performed at [facility], [place] [address], [place], [place] [address]  CBC with Differential     Status: Abnormal   Collection Time: 07/25/19  3:46 PM  Result Value Ref Range   WBC 9.2 4.0 - 10.5 K/uL   RBC 5.25 4.22 - 5.81 MIL/uL   Hemoglobin 13.3 13.0 - 17.0 g/dL   HCT 41.7 39.0 - 52.0 %   MCV 79.4 (L) 80.0 - 100.0 fL   MCH 25.3 (L) 26.0 - 34.0 pg   MCHC 31.9 30.0 - 36.0 g/dL   RDW 16.6 (H) 11.5 - 15.5 %   Platelets 317 150 - 400 K/uL   nRBC 0.0 0.0 - 0.2 %   Neutrophils Relative % 76 %   Neutro Abs 6.9 1.7 - 7.7 K/uL   Lymphocytes Relative 17 %   Lymphs Abs 1.6 0.7 - 4.0 K/uL   Monocytes Relative 6 %   Monocytes Absolute 0.6 0.1 - 1.0 K/uL   Eosinophils Relative 0 %   Eosinophils Absolute 0.0 0.0 - 0.5 K/uL   Basophils Relative 0 %   Basophils Absolute 0.0 0.0 - 0.1 K/uL   Immature Granulocytes 1 %   Abs Immature Granulocytes 0.11 (H) 0.00 - 0.07 K/uL    Comment: Performed at [facility], [place] [address], [place], [place] [address]  Wound or Superficial Culture     Status: None (Preliminary result)   Collection Time: 07/25/19  4:28 PM   Specimen: Wound  Result Value Ref Range   Specimen Description WOUND    Special Requests Normal    Gram Stain      RARE WBC PRESENT, PREDOMINANTLY PMN MODERATE GRAM POSITIVE RODS FEW GRAM POSITIVE COCCI FEW GRAM NEGATIVE RODS Performed at [facility], [place] [address] [place], [place] [address]  Culture PENDING    Report Status  PENDING   Lactic acid, plasma     Status: Abnormal   Collection Time: 07/25/19  5:35 PM  Result Value Ref Range   Lactic Acid, Venous 3.3 (HH) 0.5 - 1.9 mmol/L    Comment: CRITICAL VALUE NOTED.  VALUE IS CONSISTENT WITH PREVIOUSLY REPORTED AND CALLED VALUE. Performed at [facility], [place] [address], [place], [place] [address]   Cortisol     Status: None   Collection Time: 07/25/19  8:38 PM  Result Value Ref Range   Cortisol, Plasma 21.0 ug/dL    Comment: (NOTE) AM    6.7 - 22.6 ug/dL PM   <10.0       ug/dL Performed at [place] [address], [place], [place] [address]   Troponin I (High Sensitivity)     Status: None   Collection Time: 07/25/19  8:38 PM  Result Value Ref Range   Troponin I (High Sensitivity) 14 <18 ng/L    Comment: (NOTE) Elevated high sensitivity troponin I (hsTnI) values and significant  changes across serial measurements may suggest ACS but many other  chronic and acute conditions are known to elevate hsTnI results.  Refer to the "Links" section for chest pain algorithms and additional  guidance. Performed at [facility], [place] [address], [place], [place] [address]    Dg Ankle Complete Left  Result Date: 07/25/2019 CLINICAL DATA:  Edema of left foot and ankle. Ulcers on medial and lateral left foot. Right leg amputated EXAM: LEFT ANKLE COMPLETE - 3+ VIEW COMPARISON:  02/20/2019 FINDINGS: No fracture or bone lesion. No bone resorption to suggest osteomyelitis. Ankle joint normally spaced and aligned.  No arthropathic changes. There is diffuse soft tissue swelling. No soft tissue air. Arterial vascular calcifications are noted along the posterior tibial and dorsalis pedis arteries. IMPRESSION: 1. No fracture, ankle joint abnormality or evidence of osteomyelitis. 2. Diffuse soft tissue swelling. Electronically Signed   By: Lajean Manes M.D.   On: 07/25/2019 17:31   US Abdomen Complete  Result Date: 07/25/2019 CLINICAL DATA:  Abnormal  LFT and creatinine EXAM: ABDOMEN ULTRASOUND COMPLETE COMPARISON:  None. FINDINGS: Gallbladder: No gallstones or wall thickening visualized. No sonographic Murphy sign noted by sonographer. Common bile duct: Diameter: 1.7 mm Liver: No focal lesion identified. Within normal limits in parenchymal echogenicity. Portal vein is patent on color Doppler imaging with normal direction of blood flow towards the liver. IVC: No abnormality visualized. Limited visualization due to bowel gas. Pancreas: Visualized portion unremarkable. Limited visualization due to bowel gas. Spleen: Size and appearance within normal limits. Right Kidney: Length: 9.4 cm. Echogenicity within normal limits. No mass or hydronephrosis visualized. Left Kidney: Length: 8.6 cm. Echogenicity within normal limits. No mass or hydronephrosis visualized. Abdominal aorta: No aneurysm visualized.  Maximum diameter of 2.8 cm Other findings: None. IMPRESSION: 1. Negative for gallstones or biliary dilatation. Liver is grossly unremarkable. 2. Abdominal aortic diameter of 2.8 cm. Ectatic abdominal aorta at risk for aneurysm development. Recommend followup by ultrasound in 5 years. This recommendation follows ACR consensus guidelines: White Paper of the ACR Incidental Findings Committee II on Vascular Findings. J Am Coll Radiol 2013; 10:789-794. Electronically Signed   By: Donavan Foil M.D.   On: 07/25/2019 20:27   Dg Foot Complete Left  Result Date: 07/25/2019 CLINICAL DATA:  Edema of left foot and ankle. Ulcers on medial and lateral left foot. Right leg amputated EXAM: LEFT FOOT - COMPLETE 3+  VIEW COMPARISON:  02/20/2019 FINDINGS: There is subtle cortical resorption along the lateral base of the fifth metatarsal, which is new since the prior exam. No other areas of bone resorption.  No fracture or bone lesion. Joints are normally spaced and aligned. There is diffuse ankle soft tissue swelling, which extends to the foot. No soft tissue air. IMPRESSION: 1.  Subtle cortical resorption noted along the lateral base of the fifth metatarsal suspicious for osteomyelitis. 2. No other evidence of osteomyelitis.  No fracture or bone lesion. 3. Diffuse soft tissue swelling.  No soft tissue air. Electronically Signed   By: Lajean Manes M.D.   On: 07/25/2019 17:34    Pending Labs Unresulted Labs (From admission, onward)    Start     Ordered   07/26/19 0500  Comprehensive metabolic panel  Tomorrow morning,   R     07/25/19 1944   07/26/19 0500  CBC  Tomorrow morning,   R     07/25/19 1944   07/26/19 0500  Hepatitis panel, acute  Tomorrow morning,   STAT     07/25/19 1944   07/25/19 1949  SARS CORONAVIRUS 2 (TAT 6-24 HRS) Nasopharyngeal Nasopharyngeal Swab  (Asymptomatic/Tier 3)  Once,   STAT    Question Answer Comment  Is this test for diagnosis or screening Screening   Symptomatic for COVID-19 as defined by CDC No   Hospitalized for COVID-19 No   Admitted to ICU for COVID-19 No   Previously tested for COVID-19 Yes   Resident in a congregate (group) care setting No   Employed in healthcare setting No      07/25/19 1949   07/25/19 1535  Urinalysis, Routine w reflex microscopic  ONCE - STAT,   STAT     07/25/19 1534          Vitals/Pain Today's Vitals   07/25/19 2000 07/25/19 2130 07/25/19 2300 07/25/19 2327  BP:  (!) 154/69 (!) 160/79 (!) 160/79  Pulse:  66  61  Resp:  12 12 12   Temp:      TempSrc:      SpO2:  96% 95% 97%  Weight: 75.3 kg     PainSc:    0-No pain    Isolation Precautions No active isolations  Medications Medications  sodium chloride flush (NS) 0.9 % injection 3 mL (3 mLs Intravenous Not Given 07/25/19 1732)  acetaminophen (TYLENOL) tablet 650 mg (has no administration in time range)    Or  acetaminophen (TYLENOL) suppository 650 mg (has no administration in time range)  0.9 %  sodium chloride infusion ( Intravenous New Bag/Given 07/25/19 2206)  vancomycin (VANCOCIN) 1,250 mg in sodium chloride 0.9 % 250 mL IVPB  (has no administration in time range)  piperacillin-tazobactam (ZOSYN) IVPB 3.375 g (3.375 g Intravenous New Bag/Given 07/25/19 2037)  feeding supplement (PRO-STAT SUGAR FREE 64) liquid 30 mL (has no administration in time range)  apixaban (ELIQUIS) tablet 5 mg (5 mg Oral Given 07/25/19 2207)  sodium chloride 0.9 % bolus 500 mL (0 mLs Intravenous Stopped 07/25/19 2000)  vancomycin (VANCOCIN) 1,500 mg in sodium chloride 0.9 % 500 mL IVPB (1,500 mg Intravenous New Bag/Given 07/25/19 2210)    Mobility High fall risk   Focused Assessments    R Recommendations: See Admitting Provider Note  Report given to:   Additional Notes:

## 2019-07-26 NOTE — Progress Notes (Signed)
3e25 MRI result called also the final result in epic, MD notified via tex page. Patient is stable will continue monitor the patient.

## 2019-07-26 NOTE — Progress Notes (Addendum)
Pharmacy Antibiotic Note  [name] is a 68 y.o. male admitted on 07/25/2019 with foot ulcer.  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  Pharmacy has been consulted for vancomycin and Zosyn dosing for cellulitis/osteomyelitis.  Scr down trended to lowest level in recent months but estimated AUC remains at goal on current dose. Will continue to monitor and adjust accordingly.   11/22 Vancomycin 1250mg  Q24h Scr 1.14 AUC 447.7  11/21 Vancomycin 1250mg  Q24hr  SCr 1.53 AUC 503   Plan: Continue vancomycin 1250mg  IV Q24H  Continue Zosyn EID 3.375gm IV Q8H Monitor renal fxn, clinical progress, vanc AUC as indicated   Height: 5\' 6"  (167.6 cm) Weight: 166 lb (75.3 kg) IBW/kg (Calculated) : 63.8  Temp (24hrs), Avg:98.2 F (36.8 C), Min:97.4 F (36.3 C), Max:98.9 F (37.2 C)  Recent Labs  Lab 07/25/19 1546 07/25/19 1735 07/26/19 0347  WBC 9.2  --  8.7  CREATININE 1.53*  --  1.14  LATICACIDVEN 3.5* 3.3*  --     Estimated Creatinine Clearance: 56.7 mL/min (by C-G formula based on SCr of 1.14 mg/dL).    No Known Allergies   Vanc 11/21 >> Zosyn 11/21 >>  11/21 covid neg 11/21 wound cx (superficial) - GPR / GPC / GNR on Gram stain  Benetta Spar, PharmD, BCPS, BCCP Clinical Pharmacist  Please check AMION for all [place] phone numbers After 10:00 PM, call [place] [phone]

## 2019-07-27 ENCOUNTER — Other Ambulatory Visit: Payer: Self-pay | Admitting: Physician Assistant

## 2019-07-27 DIAGNOSIS — I739 Peripheral vascular disease, unspecified: Secondary | ICD-10-CM

## 2019-07-27 DIAGNOSIS — F0151 Vascular dementia with behavioral disturbance: Secondary | ICD-10-CM

## 2019-07-27 DIAGNOSIS — E440 Moderate protein-calorie malnutrition: Secondary | ICD-10-CM | POA: Insufficient documentation

## 2019-07-27 DIAGNOSIS — E43 Unspecified severe protein-calorie malnutrition: Secondary | ICD-10-CM

## 2019-07-27 DIAGNOSIS — M869 Osteomyelitis, unspecified: Secondary | ICD-10-CM

## 2019-07-27 LAB — BASIC METABOLIC PANEL
Anion gap: 6 (ref 5–15)
BUN: 15 mg/dL (ref 8–23)
CO2: 21 mmol/L — ABNORMAL LOW (ref 22–32)
Calcium: 7.9 mg/dL — ABNORMAL LOW (ref 8.9–10.3)
Chloride: 110 mmol/L (ref 98–111)
Creatinine, Ser: 1.04 mg/dL (ref 0.61–1.24)
GFR calc Af Amer: >60 mL/min (ref >60)
GFR calc non Af Amer: >60 mL/min (ref >60)
Glucose, Bld: 98 mg/dL (ref 70–99)
Potassium: 3.6 mmol/L (ref 3.5–5.1)
Sodium: 137 mmol/L (ref 135–145)

## 2019-07-27 LAB — HCV RNA QUANT
HCV Quantitative Log: 5.634 log10 IU/mL (ref >1.70)
HCV Quantitative: 431000 IU/mL (ref >50)

## 2019-07-27 LAB — CBC
HCT: 33.6 % — ABNORMAL LOW (ref 39.0–52.0)
Hemoglobin: 11 g/dL — ABNORMAL LOW (ref 13.0–17.0)
MCH: 25.1 pg — ABNORMAL LOW (ref 26.0–34.0)
MCHC: 32.7 g/dL (ref 30.0–36.0)
MCV: 76.5 fL — ABNORMAL LOW (ref 80.0–100.0)
Platelets: 279 10*3/uL (ref 150–400)
RBC: 4.39 MIL/uL (ref 4.22–5.81)
RDW: 16 % — ABNORMAL HIGH (ref 11.5–15.5)
WBC: 7.9 10*3/uL (ref 4.0–10.5)
nRBC: 0 % (ref 0.0–0.2)

## 2019-07-27 LAB — AEROBIC CULTURE W GRAM STAIN (SUPERFICIAL SPECIMEN): Special Requests: NORMAL

## 2019-07-27 LAB — GLUCOSE, CAPILLARY
Glucose-Capillary: 101 mg/dL — ABNORMAL HIGH (ref 70–99)
Glucose-Capillary: 102 mg/dL — ABNORMAL HIGH (ref 70–99)
Glucose-Capillary: 119 mg/dL — ABNORMAL HIGH (ref 70–99)
Glucose-Capillary: 171 mg/dL — ABNORMAL HIGH (ref 70–99)

## 2019-07-27 LAB — PROCALCITONIN: Procalcitonin: 0.25 ng/mL

## 2019-07-27 MED ORDER — ADULT MULTIVITAMIN W/MINERALS CH
1.0000 | ORAL_TABLET | Freq: Every day | ORAL | Status: DC
  Administered 2019-07-27 – 2019-08-04 (×8): 1 via ORAL
  Filled 2019-07-27 (×8): qty 1

## 2019-07-27 MED ORDER — ENSURE ENLIVE PO LIQD
237.0000 mL | Freq: Three times a day (TID) | ORAL | Status: DC
  Administered 2019-07-27 – 2019-08-04 (×21): 237 mL via ORAL

## 2019-07-27 NOTE — H&P (View-Only) (Signed)
ORTHOPAEDIC CONSULTATION  REQUESTING PHYSICIAN: Hosie Poisson, MD  Chief Complaint: Gangrenous ulcer left foot.  HPI: [name] is a 68 y.o. male who presents with dementia severe peripheral vascular disease status post a right above-the-knee amputation who presents with a fixed flexion contracture of the left knee and gangrenous ulcer over the lateral aspect of the left foot.  Past Medical History:  Diagnosis Date   Alcoholism ([place])    Complication of anesthesia    Coronary artery disease    Dementia (H[facility])    Hyperlipidemia    Hypertension    PAD (peripheral artery disease) ([facility])    non-viable tissue RLE   Paroxysmal atrial fibrillation ([place])    Pre-diabetes    Stroke [facility])    Past Surgical History:  Procedure Laterality Date   ABDOMINAL AORTOGRAM W/LOWER EXTREMITY Bilateral 03/20/2019   Procedure: ABDOMINAL AORTOGRAM W/LOWER EXTREMITY;  Surgeon: Serafina Mitchell, MD;  Location: [place] [facility];  Service: Cardiovascular;  Laterality: Bilateral;   AMPUTATION Right 05/08/2019   Procedure: AMPUTATION ABOVE KNEE RIGHT;  Surgeon: Serafina Mitchell, MD;  Location: [facility] OR;  Service: Vascular;  Laterality: Right;   heart bypass     Social History   Socioeconomic History   Marital status: Single    Spouse name: Not on file   Number of children: 2   Years of education: Not on file   Highest education level: Not on file  Occupational History   Occupation: Retired  Scientist, product/process development strain: Not on file   Food insecurity    Worry: Not on file    Inability: Not on Lexicographer needs    Medical: Not on file    Non-medical: Not on file  Tobacco Use   Smoking status: Former Smoker    Years: 50.00    Types: Cigarettes    Quit date: 2019    Years since quitting: 1.8   Smokeless tobacco: Never Used  Substance and Sexual Activity   Alcohol use: Not Currently   Drug use: Never   Sexual activity: Not Currently    Lifestyle   Physical activity    Days per week: Not on file    Minutes per session: Not on file   Stress: Not on file  Relationships   Social connections    Talks on phone: Not on file    Gets together: Not on file    Attends religious service: Not on file    Active member of club or organization: Not on file    Attends meetings of clubs or organizations: Not on file    Relationship status: Not on file  Other Topics Concern   Not on file  Social History Narrative   Pt lives in single story home with his niece and her significant other   Has 2 children   Pt unsure of level of education - knows that he started high school and that he did not finish.   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    Family History  Problem Relation Age of Onset   Lung cancer Sister    Heart disease Brother    Diabetes Sister    Clotting disorder Sister    Heart disease Sister    Colon cancer Neg Hx    Esophageal cancer Neg Hx    - negative except otherwise stated in the family history section No Known Allergies Prior to Admission medications   Medication Sig Start Date End Date Taking?  Authorizing Provider  amiodarone (PACERONE) 200 MG tablet Take 200 mg by mouth 2 (two) times daily.   Yes [provider]  apixaban (ELIQUIS) 5 MG TABS tablet Take 5 mg by mouth 2 (two) times daily with a meal.   Yes [provider]  aspirin EC 81 MG tablet Take 81 mg by mouth daily.   Yes [provider]  atorvastatin (LIPITOR) 40 MG tablet Take 40 mg by mouth at bedtime.    Yes [provider]  folic acid (FOLVITE) 1 MG tablet Take 1 mg by mouth daily.    Yes [provider]  Hydrocortisone (GERHARDT'S BUTT CREAM) CREA Apply 1 application topically daily.   Yes [provider]  oxyCODONE-acetaminophen (PERCOCET/ROXICET) 5-325 MG tablet Take 1 tablet by mouth every 6 (six) hours as needed for moderate pain. 05/14/19  Yes [name], PA-C   Dg Ankle  Complete Left  Result Date: 07/25/2019 CLINICAL DATA:  Edema of left foot and ankle. Ulcers on medial and lateral left foot. Right leg amputated EXAM: LEFT ANKLE COMPLETE - 3+ VIEW COMPARISON:  02/20/2019 FINDINGS: No fracture or bone lesion. No bone resorption to suggest osteomyelitis. Ankle joint normally spaced and aligned.  No arthropathic changes. There is diffuse soft tissue swelling. No soft tissue air. Arterial vascular calcifications are noted along the posterior tibial and dorsalis pedis arteries. IMPRESSION: 1. No fracture, ankle joint abnormality or evidence of osteomyelitis. 2. Diffuse soft tissue swelling. Electronically Signed   By: Lajean Manes M.D.   On: 07/25/2019 17:31   US Abdomen Complete  Result Date: 07/25/2019 CLINICAL DATA:  Abnormal LFT and creatinine EXAM: ABDOMEN ULTRASOUND COMPLETE COMPARISON:  None. FINDINGS: Gallbladder: No gallstones or wall thickening visualized. No sonographic Murphy sign noted by sonographer. Common bile duct: Diameter: 1.7 mm Liver: No focal lesion identified. Within normal limits in parenchymal echogenicity. Portal vein is patent on color Doppler imaging with normal direction of blood flow towards the liver. IVC: No abnormality visualized. Limited visualization due to bowel gas. Pancreas: Visualized portion unremarkable. Limited visualization due to bowel gas. Spleen: Size and appearance within normal limits. Right Kidney: Length: 9.4 cm. Echogenicity within normal limits. No mass or hydronephrosis visualized. Left Kidney: Length: 8.6 cm. Echogenicity within normal limits. No mass or hydronephrosis visualized. Abdominal aorta: No aneurysm visualized.  Maximum diameter of 2.8 cm Other findings: None. IMPRESSION: 1. Negative for gallstones or biliary dilatation. Liver is grossly unremarkable. 2. Abdominal aortic diameter of 2.8 cm. Ectatic abdominal aorta at risk for aneurysm development. Recommend followup by ultrasound in 5 years. This recommendation  follows ACR consensus guidelines: White Paper of the ACR Incidental Findings Committee II on Vascular Findings. J Am Coll Radiol 2013; 10:789-794. Electronically Signed   By: Donavan Foil M.D.   On: 07/25/2019 20:27   Mr Foot Left W Wo Contrast  Result Date: 07/26/2019 CLINICAL DATA:  Left foot pain and swelling. Abnormal x-ray EXAM: MRI OF THE LEFT FOREFOOT WITHOUT AND WITH CONTRAST TECHNIQUE: Multiplanar, multisequence MR imaging of the left forefoot was performed both before and after administration of intravenous contrast. CONTRAST:  74mL GADAVIST GADOBUTROL 1 MMOL/ML IV SOLN COMPARISON:  X-ray 07/25/2019 FINDINGS: Technical note: Examination quality is degraded by patient's inability to follow commands, positioning, and motion artifact. Best possible images were obtained. Bones/Joint/Cartilage Marrow edema and enhancement within the lateral aspect of the fifth metatarsal base with associated confluent low T1 signal changes consistent with acute osteomyelitis (series 4, image 14). There is poor definition of the  overlying cortex suggesting associated cortical destruction. Bone marrow signal abnormality extends distally to the level of the proximal metaphysis but does not extend into the fifth metatarsal diaphysis. Preservation of the fatty marrow signal within the opposing cuboid. No additional areas of marrow replacement are identified within the forefoot. No fractures. Ligaments The intrinsic foot ligaments including the Lisfranc ligament are intact. Muscles and Tendons Diffuse muscular atrophy in fatty infiltration with mild associated intramuscular edema. The peroneus brevis tendon appears thickened and irregular at its insertion on the fifth metatarsal base. Flexor and extensor tendons appear otherwise intact. No tenosynovitis. Soft tissues Soft tissue ulceration at the lateral aspect of the foot at the level of the fifth metatarsal base with hypoenhancement of the cutaneous and subcutaneous soft  tissues involving an approximately 3.3 x 0.8 x 3.3 cm area (series 9, image 37; series 10, image 18) concerning for tissue necrosis. No soft tissue fluid collections. There is marked soft tissue swelling of the dorsum of the forefoot. IMPRESSION: 1. Soft tissue ulceration at the lateral aspect of the foot at the level of the fifth metatarsal base with underlying acute osteomyelitis of the lateral aspect of the fifth metatarsal base. 2. Area of non-enhancement of the soft tissues surrounding the ulceration concerning for tissue necrosis. 3. Thickening and irregularity of the peroneus brevis tendon at its insertion on the fifth metatarsal base, likely reactive. 4. Marked soft tissue swelling of the dorsum of the forefoot. These results will be called to the ordering clinician or representative by the Radiologist Assistant, and communication documented in the PACS or zVision Dashboard. Electronically Signed   By: Davina Poke M.D.   On: 07/26/2019 10:41   Dg Foot Complete Left  Result Date: 07/25/2019 CLINICAL DATA:  Edema of left foot and ankle. Ulcers on medial and lateral left foot. Right leg amputated EXAM: LEFT FOOT - COMPLETE 3+ VIEW COMPARISON:  02/20/2019 FINDINGS: There is subtle cortical resorption along the lateral base of the fifth metatarsal, which is new since the prior exam. No other areas of bone resorption.  No fracture or bone lesion. Joints are normally spaced and aligned. There is diffuse ankle soft tissue swelling, which extends to the foot. No soft tissue air. IMPRESSION: 1. Subtle cortical resorption noted along the lateral base of the fifth metatarsal suspicious for osteomyelitis. 2. No other evidence of osteomyelitis.  No fracture or bone lesion. 3. Diffuse soft tissue swelling.  No soft tissue air. Electronically Signed   By: Lajean Manes M.D.   On: 07/25/2019 17:34   - pertinent xrays, CT, MRI studies were reviewed and independently interpreted  Positive ROS: All other systems  have been reviewed and were otherwise negative with the exception of those mentioned in the HPI and as above.  Physical Exam: General: Alert, no acute distress Psychiatric: Patient is  Not competent for consent with flat mood and affect Lymphatic: No axillary or cervical lymphadenopathy Cardiovascular: left  pedal edema Respiratory: No cyanosis, no use of accessory musculature GI: No organomegaly, abdomen is soft and non-tender    Images:  @ENCIMAGES @  Labs:  Lab Results  Component Value Date   HGBA1C 6.4 (H) 03/15/2019   ESRSEDRATE 43 (H) 03/14/2019   CRP <0.8 03/14/2019   REPTSTATUS PENDING 07/25/2019   GRAMSTAIN  07/25/2019    RARE WBC PRESENT, PREDOMINANTLY PMN MODERATE GRAM POSITIVE RODS FEW GRAM POSITIVE COCCI FEW GRAM NEGATIVE RODS    CULT  07/25/2019    CULTURE REINCUBATED FOR BETTER GROWTH Performed at [facility], [place] [address], [place], [place] [address]     Lab Results  Component Value Date   ALBUMIN 2.3 (L) 07/26/2019   ALBUMIN 2.6 (L) 07/25/2019   ALBUMIN 2.7 (L) 03/14/2019    Neurologic: Patient does not have protective sensation bilateral lower extremities.   MUSCULOSKELETAL:   Skin: Examination patient has gangrenous ulcers over the lateral aspect of the left foot.  Patient does not have palpable pulses.  Patient does not have active motor function to the left foot he has a fixed flexion contracture of the left knee and cannot actively extend the left leg.  Patient's previous arteriogram studies of both lower extremities shows no revascularization options and no named vessel runoff below the knee.  Review of the labs shows severe protein caloric malnutrition, review the radiographs shows osteomyelitis involving the base of the fifth metatarsal.    Assessment: Assessment: Dementia with severe peripheral vascular disease and severe protein caloric malnutrition with osteomyelitis and gangrenous ulcer of the left lower extremity in a  nonambulator with a fixed flexion contracture of the knee.  Plan: Plan: Patient will require a above-the-knee amputation on the left.  I will contact patient's power of attorney to obtain consent.  Anticipate surgery on Wednesday.  Thank you for the consult and the opportunity to see [name], [place] [phone] 8:10 AM

## 2019-07-27 NOTE — Progress Notes (Signed)
Initial Nutrition Assessment  DOCUMENTATION CODES:   Non-severe (moderate) malnutrition in context of chronic illness  INTERVENTION:   -D/c Prostat -MVI with minerals daily -Ensure Enlive po TID, each supplement provides 350 kcal and 20 grams of protein -Downgrade diet to dysphagia 3 (advanced mechanical soft) for ease of intake -Magic cup TID with meals, each supplement provides 290 kcal and 9 grams of protein  NUTRITION DIAGNOSIS:   Moderate Malnutrition related to chronic illness(CVA, dementia) as evidenced by percent weight loss, mild fat depletion, moderate fat depletion, mild muscle depletion, moderate muscle depletion.  GOAL:   Patient will meet greater than or equal to 90% of their needs  MONITOR:   PO intake, Supplement acceptance, Labs, Weight trends, Skin, I & O's  REASON FOR ASSESSMENT:   Consult Assessment of nutrition requirement/status  ASSESSMENT:   [name]  is a 68 y.o. male, w dementia, hypertension, hyperlipidemia, CAD, Pafib, h/o CVA, PAD s/p R AKA 05/08/2019 apparently presents with wound on the lateral aspect of the left foot . Pt is uncertain how long has been there.  Pt denies fever, chills. Pt appears to be a poor historian.  Per RN notes niece POA noticed wound yesterday.  Pt admitted with sepsis secondary to possible osteomyelitis.   Reviewed I/O's: +842 ml x 24 hours and +2.6 L since admission  UOP: 225 ml x 24 hours  Per orthopedics notes, plan for lt AKA on Wednesday (07/29/19). Pt unsure how long he has had foot wound, however, reports he has assistance with wound care at home.    Spoke with pt at bedside, who was pleasant, but provided limited history. He reports his appetite has been poor for quite sometime. Documented meal completion 50-80% per doc flowsheets, however, noted pt consumed approximately 25% of breakfast tray per RD observation. Pt reports that PTA his girlfriend Medical illustrator) and aide [name]) assist with wound care and meal  preparation. Pt shares he consumes 3 meals per day and will eat what is provided to him. Attempted to obtain diet recall, however, pt only reports he "eats all kinds of things" despite probing. Pt with extremely poor dentition, however, denies difficulty chewing foods (noted pt consumed mostly soft items such as eggs and coffee off meal tray).   Pt endorses wt loss, however, unsure how much weight he has lost or when weight loss started ("look at me- I'm skinny"). Pt shares UBW is around 125#. Reviewed wt hx; pt has experienced a 12.1% wt loss over the past 3 months, which is significant to time frame.  Discussed with pt importance of good meal and supplement intake to promote healing. Pt amenable to Ensure supplements.   Labs reviewed: CBGS: 97-101.   NUTRITION - FOCUSED PHYSICAL EXAM:    Most Recent Value  Orbital Region  Mild depletion  Upper Arm Region  Moderate depletion  Thoracic and Lumbar Region  Mild depletion  Buccal Region  Mild depletion  Temple Region  Mild depletion  Clavicle Bone Region  Moderate depletion  Clavicle and Acromion Bone Region  Mild depletion  Scapular Bone Region  Mild depletion  Dorsal Hand  Moderate depletion  Patellar Region  Severe depletion  Anterior Thigh Region  Severe depletion  Posterior Calf Region  Severe depletion  Edema (RD Assessment)  None  Hair  Reviewed  Eyes  Reviewed  Mouth  Reviewed  Skin  Reviewed  Nails  Reviewed       Diet Order:   Diet Order  Diet Heart Room service appropriate? Yes; Fluid consistency: Thin  Diet effective now              EDUCATION NEEDS:   Education needs have been addressed  Skin:  Skin Assessment: Skin Integrity Issues: Skin Integrity Issues:: Unstageable, Stage III, Other (Comment) Stage III: lt heel Unstageable: lt great toe Other: lt foot wound  Last BM:  07/27/19  Height:   Ht Readings from Last 1 Encounters:  07/26/19 5\' 6"  (1.676 m)    Weight:   Wt Readings from  Last 1 Encounters:  07/27/19 67.8 kg    Ideal Body Weight:  59.4 kg  BMI:  Body mass index is 24.13 kg/m.  Estimated Nutritional Needs:   Kcal:  1900-2100  Protein:  105-120 grams  Fluid:  > 1.9 L    Youa Deloney A. Jimmye Norman, RD, LDN, [place] Registered Dietitian II Certified Diabetes Care and Education Specialist Pager: [phone] After hours Pager: [phone]

## 2019-07-27 NOTE — Consult Note (Signed)
ORTHOPAEDIC CONSULTATION  REQUESTING PHYSICIAN: Hosie Poisson, MD  Chief Complaint: Gangrenous ulcer left foot.  HPI: [name] is a 68 y.o. male who presents with dementia severe peripheral vascular disease status post a right above-the-knee amputation who presents with a fixed flexion contracture of the left knee and gangrenous ulcer over the lateral aspect of the left foot.  Past Medical History:  Diagnosis Date   Alcoholism ([place])    Complication of anesthesia    Coronary artery disease    Dementia ([facility])    Hyperlipidemia    Hypertension    PAD (peripheral artery disease) ([facility])    non-viable tissue RLE   Paroxysmal atrial fibrillation ([place])    Pre-diabetes    Stroke [facility])    Past Surgical History:  Procedure Laterality Date   ABDOMINAL AORTOGRAM W/LOWER EXTREMITY Bilateral 03/20/2019   Procedure: ABDOMINAL AORTOGRAM W/LOWER EXTREMITY;  Surgeon: Serafina Mitchell, MD;  Location: [place] [facility];  Service: Cardiovascular;  Laterality: Bilateral;   AMPUTATION Right 05/08/2019   Procedure: AMPUTATION ABOVE KNEE RIGHT;  Surgeon: Serafina Mitchell, MD;  Location: [facility] OR;  Service: Vascular;  Laterality: Right;   heart bypass     Social History   Socioeconomic History   Marital status: Single    Spouse name: Not on file   Number of children: 2   Years of education: Not on file   Highest education level: Not on file  Occupational History   Occupation: Retired  Scientist, product/process development strain: Not on file   Food insecurity    Worry: Not on file    Inability: Not on Lexicographer needs    Medical: Not on file    Non-medical: Not on file  Tobacco Use   Smoking status: Former Smoker    Years: 50.00    Types: Cigarettes    Quit date: 2019    Years since quitting: 1.8   Smokeless tobacco: Never Used  Substance and Sexual Activity   Alcohol use: Not Currently   Drug use: Never   Sexual activity: Not Currently    Lifestyle   Physical activity    Days per week: Not on file    Minutes per session: Not on file   Stress: Not on file  Relationships   Social connections    Talks on phone: Not on file    Gets together: Not on file    Attends religious service: Not on file    Active member of club or organization: Not on file    Attends meetings of clubs or organizations: Not on file    Relationship status: Not on file  Other Topics Concern   Not on file  Social History Narrative   Pt lives in single story home with his niece and her significant other   Has 2 children   Pt unsure of level of education - knows that he started high school and that he did not finish.   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    Family History  Problem Relation Age of Onset   Lung cancer Sister    Heart disease Brother    Diabetes Sister    Clotting disorder Sister    Heart disease Sister    Colon cancer Neg Hx    Esophageal cancer Neg Hx    - negative except otherwise stated in the family history section No Known Allergies Prior to Admission medications   Medication Sig Start Date End Date Taking?  Authorizing Provider  amiodarone (PACERONE) 200 MG tablet Take 200 mg by mouth 2 (two) times daily.   Yes [provider]  apixaban (ELIQUIS) 5 MG TABS tablet Take 5 mg by mouth 2 (two) times daily with a meal.   Yes [provider]  aspirin EC 81 MG tablet Take 81 mg by mouth daily.   Yes [provider]  atorvastatin (LIPITOR) 40 MG tablet Take 40 mg by mouth at bedtime.    Yes [provider]  folic acid (FOLVITE) 1 MG tablet Take 1 mg by mouth daily.    Yes [provider]  Hydrocortisone (GERHARDT'S BUTT CREAM) CREA Apply 1 application topically daily.   Yes [provider]  oxyCODONE-acetaminophen (PERCOCET/ROXICET) 5-325 MG tablet Take 1 tablet by mouth every 6 (six) hours as needed for moderate pain. 05/14/19  Yes [name], PA-C   Dg Ankle  Complete Left  Result Date: 07/25/2019 CLINICAL DATA:  Edema of left foot and ankle. Ulcers on medial and lateral left foot. Right leg amputated EXAM: LEFT ANKLE COMPLETE - 3+ VIEW COMPARISON:  02/20/2019 FINDINGS: No fracture or bone lesion. No bone resorption to suggest osteomyelitis. Ankle joint normally spaced and aligned.  No arthropathic changes. There is diffuse soft tissue swelling. No soft tissue air. Arterial vascular calcifications are noted along the posterior tibial and dorsalis pedis arteries. IMPRESSION: 1. No fracture, ankle joint abnormality or evidence of osteomyelitis. 2. Diffuse soft tissue swelling. Electronically Signed   By: Lajean Manes M.D.   On: 07/25/2019 17:31   US Abdomen Complete  Result Date: 07/25/2019 CLINICAL DATA:  Abnormal LFT and creatinine EXAM: ABDOMEN ULTRASOUND COMPLETE COMPARISON:  None. FINDINGS: Gallbladder: No gallstones or wall thickening visualized. No sonographic Murphy sign noted by sonographer. Common bile duct: Diameter: 1.7 mm Liver: No focal lesion identified. Within normal limits in parenchymal echogenicity. Portal vein is patent on color Doppler imaging with normal direction of blood flow towards the liver. IVC: No abnormality visualized. Limited visualization due to bowel gas. Pancreas: Visualized portion unremarkable. Limited visualization due to bowel gas. Spleen: Size and appearance within normal limits. Right Kidney: Length: 9.4 cm. Echogenicity within normal limits. No mass or hydronephrosis visualized. Left Kidney: Length: 8.6 cm. Echogenicity within normal limits. No mass or hydronephrosis visualized. Abdominal aorta: No aneurysm visualized.  Maximum diameter of 2.8 cm Other findings: None. IMPRESSION: 1. Negative for gallstones or biliary dilatation. Liver is grossly unremarkable. 2. Abdominal aortic diameter of 2.8 cm. Ectatic abdominal aorta at risk for aneurysm development. Recommend followup by ultrasound in 5 years. This recommendation  follows ACR consensus guidelines: White Paper of the ACR Incidental Findings Committee II on Vascular Findings. J Am Coll Radiol 2013; 10:789-794. Electronically Signed   By: Donavan Foil M.D.   On: 07/25/2019 20:27   Mr Foot Left W Wo Contrast  Result Date: 07/26/2019 CLINICAL DATA:  Left foot pain and swelling. Abnormal x-ray EXAM: MRI OF THE LEFT FOREFOOT WITHOUT AND WITH CONTRAST TECHNIQUE: Multiplanar, multisequence MR imaging of the left forefoot was performed both before and after administration of intravenous contrast. CONTRAST:  3mL GADAVIST GADOBUTROL 1 MMOL/ML IV SOLN COMPARISON:  X-ray 07/25/2019 FINDINGS: Technical note: Examination quality is degraded by patient's inability to follow commands, positioning, and motion artifact. Best possible images were obtained. Bones/Joint/Cartilage Marrow edema and enhancement within the lateral aspect of the fifth metatarsal base with associated confluent low T1 signal changes consistent with acute osteomyelitis (series 4, image 14). There is poor definition of the  overlying cortex suggesting associated cortical destruction. Bone marrow signal abnormality extends distally to the level of the proximal metaphysis but does not extend into the fifth metatarsal diaphysis. Preservation of the fatty marrow signal within the opposing cuboid. No additional areas of marrow replacement are identified within the forefoot. No fractures. Ligaments The intrinsic foot ligaments including the Lisfranc ligament are intact. Muscles and Tendons Diffuse muscular atrophy in fatty infiltration with mild associated intramuscular edema. The peroneus brevis tendon appears thickened and irregular at its insertion on the fifth metatarsal base. Flexor and extensor tendons appear otherwise intact. No tenosynovitis. Soft tissues Soft tissue ulceration at the lateral aspect of the foot at the level of the fifth metatarsal base with hypoenhancement of the cutaneous and subcutaneous soft  tissues involving an approximately 3.3 x 0.8 x 3.3 cm area (series 9, image 37; series 10, image 18) concerning for tissue necrosis. No soft tissue fluid collections. There is marked soft tissue swelling of the dorsum of the forefoot. IMPRESSION: 1. Soft tissue ulceration at the lateral aspect of the foot at the level of the fifth metatarsal base with underlying acute osteomyelitis of the lateral aspect of the fifth metatarsal base. 2. Area of non-enhancement of the soft tissues surrounding the ulceration concerning for tissue necrosis. 3. Thickening and irregularity of the peroneus brevis tendon at its insertion on the fifth metatarsal base, likely reactive. 4. Marked soft tissue swelling of the dorsum of the forefoot. These results will be called to the ordering clinician or representative by the Radiologist Assistant, and communication documented in the PACS or zVision Dashboard. Electronically Signed   By: Davina Poke M.D.   On: 07/26/2019 10:41   Dg Foot Complete Left  Result Date: 07/25/2019 CLINICAL DATA:  Edema of left foot and ankle. Ulcers on medial and lateral left foot. Right leg amputated EXAM: LEFT FOOT - COMPLETE 3+ VIEW COMPARISON:  02/20/2019 FINDINGS: There is subtle cortical resorption along the lateral base of the fifth metatarsal, which is new since the prior exam. No other areas of bone resorption.  No fracture or bone lesion. Joints are normally spaced and aligned. There is diffuse ankle soft tissue swelling, which extends to the foot. No soft tissue air. IMPRESSION: 1. Subtle cortical resorption noted along the lateral base of the fifth metatarsal suspicious for osteomyelitis. 2. No other evidence of osteomyelitis.  No fracture or bone lesion. 3. Diffuse soft tissue swelling.  No soft tissue air. Electronically Signed   By: Lajean Manes M.D.   On: 07/25/2019 17:34   - pertinent xrays, CT, MRI studies were reviewed and independently interpreted  Positive ROS: All other systems  have been reviewed and were otherwise negative with the exception of those mentioned in the HPI and as above.  Physical Exam: General: Alert, no acute distress Psychiatric: Patient is  Not competent for consent with flat mood and affect Lymphatic: No axillary or cervical lymphadenopathy Cardiovascular: left  pedal edema Respiratory: No cyanosis, no use of accessory musculature GI: No organomegaly, abdomen is soft and non-tender    Images:  @ENCIMAGES @  Labs:  Lab Results  Component Value Date   HGBA1C 6.4 (H) 03/15/2019   ESRSEDRATE 43 (H) 03/14/2019   CRP <0.8 03/14/2019   REPTSTATUS PENDING 07/25/2019   GRAMSTAIN  07/25/2019    RARE WBC PRESENT, PREDOMINANTLY PMN MODERATE GRAM POSITIVE RODS FEW GRAM POSITIVE COCCI FEW GRAM NEGATIVE RODS    CULT  07/25/2019    CULTURE REINCUBATED FOR BETTER GROWTH Performed at [facility], [place] [address], [place], [place] [address]     Lab Results  Component Value Date   ALBUMIN 2.3 (L) 07/26/2019   ALBUMIN 2.6 (L) 07/25/2019   ALBUMIN 2.7 (L) 03/14/2019    Neurologic: Patient does not have protective sensation bilateral lower extremities.   MUSCULOSKELETAL:   Skin: Examination patient has gangrenous ulcers over the lateral aspect of the left foot.  Patient does not have palpable pulses.  Patient does not have active motor function to the left foot he has a fixed flexion contracture of the left knee and cannot actively extend the left leg.  Patient's previous arteriogram studies of both lower extremities shows no revascularization options and no named vessel runoff below the knee.  Review of the labs shows severe protein caloric malnutrition, review the radiographs shows osteomyelitis involving the base of the fifth metatarsal.    Assessment: Assessment: Dementia with severe peripheral vascular disease and severe protein caloric malnutrition with osteomyelitis and gangrenous ulcer of the left lower extremity in a  nonambulator with a fixed flexion contracture of the knee.  Plan: Plan: Patient will require a above-the-knee amputation on the left.  I will contact patient's power of attorney to obtain consent.  Anticipate surgery on Wednesday.  Thank you for the consult and the opportunity to see [name], [place] [phone] 8:10 AM

## 2019-07-27 NOTE — Consult Note (Signed)
[facility] Nurse Consult Note: Patient receiving care in [place].  See Dr. Jess Barters note from today at 8:10 a.m.  Surgery projected for this week on Wednesday. Reason for Consult: left foot wound Wound type: gangrenous per Dr. Sharol Given Dressing procedure/placement/frequency:  Place a telfa pad over the left foot wounds. Secure with Kerlex. Val Riles, RN, MSN, CWOCN, CNS-BC, pager [phone]

## 2019-07-27 NOTE — Progress Notes (Signed)
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  X-ray of the left foot show signs suspicious for osteomyelitis it was followed with an MRI of the left foot.  It shows Soft tissue ulceration at the lateral aspect of the foot at the level of the fifth metatarsal base with underlying acute osteomyelitis of the lateral aspect of the fifth metatarsal base.  Area of non-enhancement of the soft tissues surrounding the ulceration concerning for tissue necrosis. Thickening and irregularity of the peroneus brevis tendon at its insertion on the fifth metatarsal base, likely reactive.  Marked soft tissue swelling of the dorsum of the forefoot.  Orthopedics consulted and Eliquis held for  amputation on Wednesday. Discussed with [name] at bedside and updated her.    Assessment & Plan:   Active Problems:   Dementia ([name])   Hyperlipidemia   Hypertension   Paroxysmal atrial fibrillation (HCC)   Foot ulcer (HCC)   Osteomyelitis (HCC)   Protein-calorie malnutrition, severe (HCC)   PVD (peripheral vascular disease) (HCC)   Malnutrition of moderate degree  Sepsis probably secondary to osteomyelitis. Sepsis pathology improving, lactic acid normalized, blood pressure improved, tachycardia improved. Osteomyelitis of the fifth metatarsal bone on the left Patient was started on IV vancomycin and IV Zosyn. Orthopedics consulted, Dr. Sharol Given  Plans to take the patient for OR on Wednesday.  Pain controlled. Remains afebrile and wbc count wnl.    History of CVA Continue with aspirin and Lipitor 40 mg daily   Essential hypertension Well controlled.    Moderate  protein calorie malnutrition Patient is currently on feeding supplements. Appreciate nutrition consult.   Microcytic anemia: Anemia panel will be ordered.   History of dementia Patient is not agitated at this time.  Continue to monitor    Paroxysmal atrial fibrillation NSR today.  Rate control with amiodarone.  On Eliquis for anticoagulation which is on hold for the procedure.  Echocardiogram done today shows Left ventricular ejection fraction, by visual estimation, is 60 to 65%. The left ventricle has normal function. There is mildly increased left ventricular hypertrophy. Global right ventricle has normal systolic function.The right ventricular size is normal. No increase in right ventricular wall thickness.   Positive HCV antibody HCV RNA quantitative ordered.  Elevated AST at 70, normal alk phos and normal ALT.  Bilirubin at 0.2 liver is grossly normal   On ultrasound incidental finding of Abdominal aortic diameter of 2.8 cm. Ectatic abdominal aorta at risk for aneurysm development. Recommend followup by ultrasound in 5 years.    H/o PVD;  Pt had undergone aortogram with lower extremity in 03/2019 by Dr Trula Slade  Showing Bilateral superficial femoral artery occlusions without reconstitution of a named distal vessel.  The patient is not a candidate for revascularization either open or percutaneous.  DVT prophylaxis: heparin.  Code Status: full code.  Family Communication: none at bedside.  Disposition Plan: pending further evaluation by orthopedics.    Consultants:   Orthopedics Dr Sharol Given  Procedures:  Echo Left ventricular ejection fraction, by visual estimation, is 60 to 65%. The left ventricle has normal function. There is mildly increased left ventricular hypertrophy. Global right ventricle has normal systolic function.The right ventricular size is normal. No increase in  right ventricular wall thickness   Antimicrobials: (vancomycin and zosyn since admission.    Subjective: Pt appears comfortable, pain tolerable . No chest pain or sob.   Objective: Vitals:   07/27/19 0059 07/27/19 0453 07/27/19 0738 07/27/19 1207  BP: 128/74 123/63 128/67 (!) 146/70  Pulse: 70 67 68 74  Resp: '18 16 18 18  ' Temp: 99.2 F (37.3 C) 99.2 F (37.3 C) 98 F (36.7 C) 98.8 F (37.1 C)  TempSrc: Oral Oral  Oral  SpO2: 100% 100% 100% 100%  Weight:  67.8 kg    Height:        Intake/Output Summary (Last 24 hours) at 07/27/2019 1700 Last data filed at 07/27/2019 1248 Gross per 24 hour  Intake 759.06 ml  Output 225 ml  Net 534.06 ml   Filed Weights   07/25/19 2000 07/27/19 0453  Weight: 75.3 kg 67.8 kg    Examination:  General exam: calm and comfortable.  Respiratory system: clear to auscultation, no wheezing or rhonchi. . Cardiovascular system: S1S2, RRR, no JVD.  Gastrointestinal system:  abd is soft non tender non distended bowel sounds good.  Central nervous system: Alert , but slightly confused.  Extremities:  Right AKI. Gangrenous left foot.  Skin: left foot ulcer.  Psychiatry:  Mood is appropriate.     Data Reviewed: I have personally reviewed following labs and imaging studies  CBC: Recent Labs  Lab 07/25/19 1546 07/26/19 0347 07/27/19 0417  WBC 9.2 8.7 7.9  NEUTROABS 6.9  --   --   HGB 13.3 13.1 11.0*  HCT 41.7 40.4 33.6*  MCV 79.4* 78.6* 76.5*  PLT 317 302 867   Basic Metabolic Panel: Recent Labs  Lab 07/25/19 1546 07/26/19 0347 07/27/19 0417  NA 139 139 137  K 4.0 3.8 3.6  CL 109 110 110  CO2 20* 20* 21*  GLUCOSE 129* 86 98  BUN '18 16 15  ' CREATININE 1.53* 1.14 1.04  CALCIUM 9.0 8.8* 7.9*   GFR: Estimated Creatinine Clearance: 62.2 mL/min (by C-G formula based on SCr of 1.04 mg/dL). Liver Function Tests: Recent Labs  Lab 07/25/19 1546 07/26/19 0347  AST 63* 70*  ALT 41 42  ALKPHOS 63 56  BILITOT 0.4 0.2*  PROT 6.7 6.4*  ALBUMIN 2.6* 2.3*   No results for input(s): LIPASE, AMYLASE in the last 168  hours. No results for input(s): AMMONIA in the last 168 hours. Coagulation Profile: No results for input(s): INR, PROTIME in the last 168 hours. Cardiac Enzymes: No results for input(s): CKTOTAL, CKMB, CKMBINDEX, TROPONINI in the last 168 hours. BNP (last 3 results) No results for input(s): PROBNP in the last 8760 hours. HbA1C: No results for input(s): HGBA1C in the last 72 hours. CBG: Recent Labs  Lab 07/26/19 2110 07/27/19 0603 07/27/19 1113 07/27/19 1631  GLUCAP 97 101* 102* 119*   Lipid Profile: No results for input(s): CHOL, HDL, LDLCALC, TRIG, CHOLHDL, LDLDIRECT in the last 72 hours. Thyroid Function Tests: No results for input(s): TSH, T4TOTAL, FREET4, T3FREE, THYROIDAB in the last 72 hours. Anemia Panel: No results for input(s): VITAMINB12, FOLATE, FERRITIN, TIBC, IRON, RETICCTPCT in the last 72 hours. Sepsis Labs: Recent Labs  Lab 07/25/19 1546 07/25/19 1735 07/26/19 1233 07/26/19 1657 07/27/19 0417  PROCALCITON  --   --   --  0.23 0.25  LATICACIDVEN 3.5* 3.3* 1.9  --   --     Recent Results (from the past 240 hour(s))  Wound or Superficial Culture     Status: Abnormal  Collection Time: 07/25/19  4:28 PM   Specimen: Wound  Result Value Ref Range Status   Specimen Description WOUND  Final   Special Requests Normal  Final   Gram Stain   Final    RARE WBC PRESENT, PREDOMINANTLY PMN MODERATE GRAM POSITIVE RODS FEW GRAM POSITIVE COCCI FEW GRAM NEGATIVE RODS Performed at [facility], [place] [address], [place], [place] [address]    Culture MULTIPLE ORGANISMS PRESENT, NONE PREDOMINANT (A)  Final   Report Status 07/27/2019 FINAL  Final  SARS CORONAVIRUS 2 (TAT 6-24 HRS) Nasopharyngeal Nasopharyngeal Swab     Status: None   Collection Time: 07/25/19  8:38 PM   Specimen: Nasopharyngeal Swab  Result Value Ref Range Status   SARS Coronavirus 2 NEGATIVE NEGATIVE Final    Comment: (NOTE) SARS-CoV-2 target nucleic acids are NOT DETECTED. The SARS-CoV-2 RNA  is generally detectable in upper and lower respiratory specimens during the acute phase of infection. Negative results do not preclude SARS-CoV-2 infection, do not rule out co-infections with other pathogens, and should not be used as the sole basis for treatment or other patient management decisions. Negative results must be combined with clinical observations, patient history, and epidemiological information. The expected result is Negative. Fact Sheet for Patients: SugarRoll.be Fact Sheet for Healthcare Providers: https://www.woods-mathews.com/ This test is not yet approved or cleared by the [place] FDA and  has been authorized for detection and/or diagnosis of SARS-CoV-2 by FDA under an Emergency Use Authorization (EUA). This EUA will remain  in effect (meaning this test can be used) for the duration of the COVID-19 declaration under Section 56 4(b)(1) of the Act, 21 U.S.C. section 360bbb-3(b)(1), unless the authorization is terminated or revoked sooner. Performed at [facility], [place] [address], [place], [place] [address]          Radiology Studies: Dg Ankle Complete Left  Result Date: 07/25/2019 CLINICAL DATA:  Edema of left foot and ankle. Ulcers on medial and lateral left foot. Right leg amputated EXAM: LEFT ANKLE COMPLETE - 3+ VIEW COMPARISON:  02/20/2019 FINDINGS: No fracture or bone lesion. No bone resorption to suggest osteomyelitis. Ankle joint normally spaced and aligned.  No arthropathic changes. There is diffuse soft tissue swelling. No soft tissue air. Arterial vascular calcifications are noted along the posterior tibial and dorsalis pedis arteries. IMPRESSION: 1. No fracture, ankle joint abnormality or evidence of osteomyelitis. 2. Diffuse soft tissue swelling. Electronically Signed   By: Lajean Manes M.D.   On: 07/25/2019 17:31   US Abdomen Complete  Result Date: 07/25/2019 CLINICAL DATA:  Abnormal LFT and  creatinine EXAM: ABDOMEN ULTRASOUND COMPLETE COMPARISON:  None. FINDINGS: Gallbladder: No gallstones or wall thickening visualized. No sonographic Murphy sign noted by sonographer. Common bile duct: Diameter: 1.7 mm Liver: No focal lesion identified. Within normal limits in parenchymal echogenicity. Portal vein is patent on color Doppler imaging with normal direction of blood flow towards the liver. IVC: No abnormality visualized. Limited visualization due to bowel gas. Pancreas: Visualized portion unremarkable. Limited visualization due to bowel gas. Spleen: Size and appearance within normal limits. Right Kidney: Length: 9.4 cm. Echogenicity within normal limits. No mass or hydronephrosis visualized. Left Kidney: Length: 8.6 cm. Echogenicity within normal limits. No mass or hydronephrosis visualized. Abdominal aorta: No aneurysm visualized.  Maximum diameter of 2.8 cm Other findings: None. IMPRESSION: 1. Negative for gallstones or biliary dilatation. Liver is grossly unremarkable. 2. Abdominal aortic diameter of 2.8 cm. Ectatic abdominal aorta at risk for aneurysm development.  Recommend followup by ultrasound in 5 years. This recommendation follows ACR consensus guidelines: White Paper of the ACR Incidental Findings Committee II on Vascular Findings. J Am Coll Radiol 2013; 10:789-794. Electronically Signed   By: Donavan Foil M.D.   On: 07/25/2019 20:27   Mr Foot Left W Wo Contrast  Result Date: 07/26/2019 CLINICAL DATA:  Left foot pain and swelling. Abnormal x-ray EXAM: MRI OF THE LEFT FOREFOOT WITHOUT AND WITH CONTRAST TECHNIQUE: Multiplanar, multisequence MR imaging of the left forefoot was performed both before and after administration of intravenous contrast. CONTRAST:  58m GADAVIST GADOBUTROL 1 MMOL/ML IV SOLN COMPARISON:  X-ray 07/25/2019 FINDINGS: Technical note: Examination quality is degraded by patient's inability to follow commands, positioning, and motion artifact. Best possible images were  obtained. Bones/Joint/Cartilage Marrow edema and enhancement within the lateral aspect of the fifth metatarsal base with associated confluent low T1 signal changes consistent with acute osteomyelitis (series 4, image 14). There is poor definition of the overlying cortex suggesting associated cortical destruction. Bone marrow signal abnormality extends distally to the level of the proximal metaphysis but does not extend into the fifth metatarsal diaphysis. Preservation of the fatty marrow signal within the opposing cuboid. No additional areas of marrow replacement are identified within the forefoot. No fractures. Ligaments The intrinsic foot ligaments including the Lisfranc ligament are intact. Muscles and Tendons Diffuse muscular atrophy in fatty infiltration with mild associated intramuscular edema. The peroneus brevis tendon appears thickened and irregular at its insertion on the fifth metatarsal base. Flexor and extensor tendons appear otherwise intact. No tenosynovitis. Soft tissues Soft tissue ulceration at the lateral aspect of the foot at the level of the fifth metatarsal base with hypoenhancement of the cutaneous and subcutaneous soft tissues involving an approximately 3.3 x 0.8 x 3.3 cm area (series 9, image 37; series 10, image 18) concerning for tissue necrosis. No soft tissue fluid collections. There is marked soft tissue swelling of the dorsum of the forefoot. IMPRESSION: 1. Soft tissue ulceration at the lateral aspect of the foot at the level of the fifth metatarsal base with underlying acute osteomyelitis of the lateral aspect of the fifth metatarsal base. 2. Area of non-enhancement of the soft tissues surrounding the ulceration concerning for tissue necrosis. 3. Thickening and irregularity of the peroneus brevis tendon at its insertion on the fifth metatarsal base, likely reactive. 4. Marked soft tissue swelling of the dorsum of the forefoot. These results will be called to the ordering clinician or  representative by the Radiologist Assistant, and communication documented in the PACS or zVision Dashboard. Electronically Signed   By: NDavina PokeM.D.   On: 07/26/2019 10:41   Dg Foot Complete Left  Result Date: 07/25/2019 CLINICAL DATA:  Edema of left foot and ankle. Ulcers on medial and lateral left foot. Right leg amputated EXAM: LEFT FOOT - COMPLETE 3+ VIEW COMPARISON:  02/20/2019 FINDINGS: There is subtle cortical resorption along the lateral base of the fifth metatarsal, which is new since the prior exam. No other areas of bone resorption.  No fracture or bone lesion. Joints are normally spaced and aligned. There is diffuse ankle soft tissue swelling, which extends to the foot. No soft tissue air. IMPRESSION: 1. Subtle cortical resorption noted along the lateral base of the fifth metatarsal suspicious for osteomyelitis. 2. No other evidence of osteomyelitis.  No fracture or bone lesion. 3. Diffuse soft tissue swelling.  No soft tissue air. Electronically Signed   By: DLajean ManesM.D.   On: 07/25/2019 17:34  Scheduled Meds:  amiodarone  200 mg Oral BID   aspirin EC  81 mg Oral Daily   atorvastatin  40 mg Oral QHS   feeding supplement (ENSURE ENLIVE)  237 mL Oral TID BM   folic acid  1 mg Oral Daily   Gerhardt's butt cream  1 application Topical Daily   heparin injection (subcutaneous)  5,000 Units Subcutaneous Q8H   hydrALAZINE  25 mg Oral Q8H   multivitamin with minerals  1 tablet Oral Daily   sodium chloride flush  3 mL Intravenous Once   Continuous Infusions:  piperacillin-tazobactam (ZOSYN)  IV 3.375 g (07/27/19 1400)   vancomycin 1,250 mg (07/26/19 2321)     LOS: 2 days        Hosie Poisson, MD [facility] 07/27/2019, 5:00 PM

## 2019-07-28 LAB — RETICULOCYTES
Immature Retic Fract: 13.2 % (ref 2.3–15.9)
RBC.: 4.62 MIL/uL (ref 4.22–5.81)
Retic Count, Absolute: 33.3 10*3/uL (ref 19.0–186.0)
Retic Ct Pct: 0.7 % (ref 0.4–3.1)

## 2019-07-28 LAB — IRON AND TIBC
Iron: 49 ug/dL (ref 45–182)
Saturation Ratios: 22 % (ref 17.9–39.5)
TIBC: 218 ug/dL — ABNORMAL LOW (ref 250–450)
UIBC: 169 ug/dL

## 2019-07-28 LAB — VITAMIN B12: Vitamin B-12: 304 pg/mL (ref 180–914)

## 2019-07-28 LAB — FERRITIN: Ferritin: 518 ng/mL — ABNORMAL HIGH (ref 24–336)

## 2019-07-28 LAB — PROCALCITONIN: Procalcitonin: 0.46 ng/mL

## 2019-07-28 LAB — GLUCOSE, CAPILLARY
Glucose-Capillary: 120 mg/dL — ABNORMAL HIGH (ref 70–99)
Glucose-Capillary: 103 mg/dL — ABNORMAL HIGH (ref 70–99)
Glucose-Capillary: 241 mg/dL — ABNORMAL HIGH (ref 70–99)

## 2019-07-28 LAB — FOLATE: Folate: 27 ng/mL (ref >5.9)

## 2019-07-28 MED ORDER — CHLORHEXIDINE GLUCONATE 4 % EX LIQD
60.0000 mL | Freq: Once | CUTANEOUS | Status: AC
  Administered 2019-07-29: 4 via TOPICAL
  Filled 2019-07-28: qty 60

## 2019-07-28 MED ORDER — CEFAZOLIN SODIUM-DEXTROSE 2-4 GM/100ML-% IV SOLN
2.0000 g | INTRAVENOUS | Status: AC
  Administered 2019-07-29: 13:00:00 2 g via INTRAVENOUS
  Filled 2019-07-28 (×2): qty 100

## 2019-07-28 NOTE — Plan of Care (Signed)
  Problem: Clinical Measurements: Goal: Will remain free from infection Outcome: Progressing Goal: Respiratory complications will improve Outcome: Progressing   Problem: Safety: Goal: Ability to remain free from injury will improve Outcome: Progressing   

## 2019-07-28 NOTE — Progress Notes (Signed)
Called patients niece [name] to obtain consent for the patients surgery tomorrow. She stated she has not heard from the doctor and will not give consent until she has spoken to the surgeon.

## 2019-07-28 NOTE — Progress Notes (Signed)
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  X-ray of the left foot show signs suspicious for osteomyelitis it was followed with an MRI of the left foot.  It shows Soft tissue ulceration at the lateral aspect of the foot at the level of the fifth metatarsal base with underlying acute osteomyelitis of the lateral aspect of the fifth metatarsal base.  Area of non-enhancement of the soft tissues surrounding the ulceration concerning for tissue necrosis. Thickening and irregularity of the peroneus brevis tendon at its insertion on the fifth metatarsal base, likely reactive.  Marked soft tissue swelling of the dorsum of the forefoot.  Orthopedics consulted and Eliquis held for  amputation on Wednesday. Discussed with [name] at bedside and updated her. No new complaints overnight. He reports pain is well controlled.    Assessment & Plan:   Active Problems:   Dementia ([place])   Hyperlipidemia   Hypertension   Paroxysmal atrial fibrillation (HCC)   Foot ulcer (HCC)   Osteomyelitis (HCC)   Protein-calorie malnutrition, severe (HCC)   PVD (peripheral vascular disease) (HCC)   Malnutrition of moderate degree  Sepsis probably secondary to osteomyelitis. Sepsis pathology improving, lactic acid normalized, blood pressure improved, tachycardia improved. Osteomyelitis of the fifth metatarsal bone on the left Patient was started on IV vancomycin and IV Zosyn. Orthopedics consulted, Dr. Sharol Given  plans to take the patient for OR on Wednesday. NPO after midnight.  Pain is well controlled. Patient  remains afebrile and wbc count wnl.    History of CVA Continue  with aspirin and Lipitor 40 mg daily   Essential hypertension Well controlled.    Moderate protein calorie malnutrition Patient is currently on feeding supplements. Appreciate nutrition consult.   Microcytic anemia: Anemia panel pending.   History of dementia Pt Is calm and able to answering simple questions and following commands.     Paroxysmal atrial fibrillation NSR on telemetry.  Rate control with amiodarone.  On Eliquis for anticoagulation which is on hold for the procedure.  Echocardiogram done  shows Left ventricular ejection fraction, by visual estimation, is 60 to 65%. The left ventricle has normal function. There is mildly increased left ventricular hypertrophy. Global right ventricle has normal systolic function.The right ventricular size is normal. No increase in right ventricular wall thickness.   Positive HCV antibody HCV RNA 431000.   Elevated AST at 70, normal alk phos and normal ALT.  Bilirubin at 0.2 liver is grossly normal. Will need outpatient follow up with ID/ GI for HCV treatment.    Ultrasound incidental finding of Abdominal aortic diameter of 2.8 cm. Ectatic abdominal aorta at risk for aneurysm development. Recommend followup by ultrasound in 5 years.    H/o PVD;  Pt had undergone aortogram with lower extremity in 03/2019 by Dr Trula Slade  Showing Bilateral superficial femoral artery occlusions without reconstitution of a named distal vessel.  The patient is not a candidate for revascularization either open or percutaneous.  DVT prophylaxis: heparin.  Code Status: full code.  Family Communication: none at bedside. Discussed with daughter at bedside on 11/23. Disposition Plan: pending further evaluation by orthopedics.    Consultants:   Orthopedics Dr Sharol Given  Procedures:  Echo Left ventricular ejection fraction,  by visual estimation, is 60 to 65%. The left ventricle has normal function. There is mildly increased left ventricular hypertrophy. Global right  ventricle has normal systolic function.The right ventricular size is normal. No increase in right ventricular wall thickness   Antimicrobials: vancomycin and zosyn since admission.   Subjective: No chest pain or sob.  Foot pain is controlled.   Objective: Vitals:   07/28/19 0011 07/28/19 0244 07/28/19 0400 07/28/19 1231  BP: (!) 114/55  126/60 122/70  Pulse: 78  73 76  Resp: '18  18 18  ' Temp: 98.9 F (37.2 C)  98.4 F (36.9 C) 98.4 F (36.9 C)  TempSrc: Oral  Oral Oral  SpO2: 100%  100% 100%  Weight:  66.4 kg    Height:        Intake/Output Summary (Last 24 hours) at 07/28/2019 1725 Last data filed at 07/28/2019 1300 Gross per 24 hour  Intake 733.09 ml  Output 550 ml  Net 183.09 ml   Filed Weights   07/25/19 2000 07/27/19 0453 07/28/19 0244  Weight: 75.3 kg 67.8 kg 66.4 kg    Examination:  General exam:  Alert and comfortable.  Respiratory system: Clear to auscultation bilaterally, no wheezing or rhonchi Cardiovascular system: S1-S2 heard, regular rate rhythm, no JVD Gastrointestinal system: Abdomen is soft, nontender, nondistended, bowel sounds are normal Central nervous system: Alert and able to answer all questions appropriately, bedbound Extremities: Right AKA, gangrenous left foot Skin: Gangrene on the left foot Psychiatry: Mood is appropriate    Data Reviewed: I have personally reviewed following labs and imaging studies  CBC: Recent Labs  Lab 07/25/19 1546 07/26/19 0347 07/27/19 0417  WBC 9.2 8.7 7.9  NEUTROABS 6.9  --   --   HGB 13.3 13.1 11.0*  HCT 41.7 40.4 33.6*  MCV 79.4* 78.6* 76.5*  PLT 317 302 790   Basic Metabolic Panel: Recent Labs  Lab 07/25/19 1546 07/26/19 0347 07/27/19 0417  NA 139 139 137  K 4.0 3.8 3.6  CL 109 110 110  CO2 20* 20* 21*  GLUCOSE 129* 86 98  BUN '18 16 15  ' CREATININE 1.53* 1.14 1.04  CALCIUM 9.0 8.8* 7.9*   GFR: Estimated Creatinine Clearance: 62.2 mL/min (by C-G formula based on SCr of 1.04 mg/dL).  Liver Function Tests: Recent Labs  Lab 07/25/19 1546 07/26/19 0347  AST 63* 70*  ALT 41 42  ALKPHOS 63 56  BILITOT 0.4 0.2*  PROT 6.7 6.4*  ALBUMIN 2.6* 2.3*   No results for input(s): LIPASE, AMYLASE in the last 168 hours. No results for input(s): AMMONIA in the last 168 hours. Coagulation Profile: No results for input(s): INR, PROTIME in the last 168 hours. Cardiac Enzymes: No results for input(s): CKTOTAL, CKMB, CKMBINDEX, TROPONINI in the last 168 hours. BNP (last 3 results) No results for input(s): PROBNP in the last 8760 hours. HbA1C: No results for input(s): HGBA1C in the last 72 hours. CBG: Recent Labs  Lab 07/27/19 1113 07/27/19 1631 07/27/19 2127 07/28/19 1221 07/28/19 1621  GLUCAP 102* 119* 171* 120* 103*   Lipid Profile: No results for input(s): CHOL, HDL, LDLCALC, TRIG, CHOLHDL, LDLDIRECT in the last 72 hours. Thyroid Function Tests: No results for input(s): TSH, T4TOTAL, FREET4, T3FREE, THYROIDAB in the last 72 hours. Anemia Panel: No results for input(s): VITAMINB12, FOLATE, FERRITIN, TIBC, IRON, RETICCTPCT in the last 72 hours. Sepsis Labs: Recent Labs  Lab 07/25/19 1546 07/25/19 1735 07/26/19 1233 07/26/19 1657 07/27/19 0417 07/28/19 0505  PROCALCITON  --   --   --  0.23 0.25 0.46  LATICACIDVEN 3.5* 3.3* 1.9  --   --   --     Recent Results (from the past 240 hour(s))  Wound or Superficial Culture     Status: Abnormal   Collection Time: 07/25/19  4:28 PM   Specimen: Wound  Result Value Ref Range Status   Specimen Description WOUND  Final   Special Requests Normal  Final   Gram Stain   Final    RARE WBC PRESENT, PREDOMINANTLY PMN MODERATE GRAM POSITIVE RODS FEW GRAM POSITIVE COCCI FEW GRAM NEGATIVE RODS Performed at [facility], [place] [address] [place], [place] [address]    Culture MULTIPLE ORGANISMS PRESENT, NONE PREDOMINANT (A)  Final   Report Status 07/27/2019 FINAL  Final  SARS CORONAVIRUS 2 (TAT 6-24 HRS) Nasopharyngeal  Nasopharyngeal Swab     Status: None   Collection Time: 07/25/19  8:38 PM   Specimen: Nasopharyngeal Swab  Result Value Ref Range Status   SARS Coronavirus 2 NEGATIVE NEGATIVE Final    Comment: (NOTE) SARS-CoV-2 target nucleic acids are NOT DETECTED. The SARS-CoV-2 RNA is generally detectable in upper and lower respiratory specimens during the acute phase of infection. Negative results do not preclude SARS-CoV-2 infection, do not rule out co-infections with other pathogens, and should not be used as the sole basis for treatment or other patient management decisions. Negative results must be combined with clinical observations, patient history, and epidemiological information. The expected result is Negative. Fact Sheet for Patients: SugarRoll.be Fact Sheet for Healthcare Providers: https://www.woods-mathews.com/ This test is not yet approved or cleared by the [place] FDA and  has been authorized for detection and/or diagnosis of SARS-CoV-2 by FDA under an Emergency Use Authorization (EUA). This EUA will remain  in effect (meaning this test can be used) for the duration of the COVID-19 declaration under Section 56 4(b)(1) of the Act, 21 U.S.C. section 360bbb-3(b)(1), unless the authorization is terminated or revoked sooner. Performed at [facility], [place] [address], [place],  [address]          Radiology Studies: No results found.      Scheduled Meds: . amiodarone  200 mg Oral BID  . aspirin EC  81 mg Oral Daily  . atorvastatin  40 mg Oral QHS  . feeding supplement (ENSURE ENLIVE)  237 mL Oral TID BM  . folic acid  1 mg Oral Daily  . Gerhardt's butt cream  1 application Topical Daily  . heparin injection (subcutaneous)  5,000 Units Subcutaneous Q8H  . hydrALAZINE  25 mg Oral Q8H  . multivitamin with minerals  1 tablet Oral Daily  . sodium chloride flush  3 mL Intravenous Once   Continuous Infusions: .  piperacillin-tazobactam (ZOSYN)  IV 3.375 g (07/28/19 1310)  . vancomycin 1,250 mg (07/28/19 0157)     LOS: 3 days        Hosie Poisson, MD [facility] 07/28/2019, 5:25 PM

## 2019-07-28 NOTE — Care Management Important Message (Signed)
Important Message  Patient Details  Name: [name] MRN: [id number] Date of Birth: [birth year]   Medicare Important Message Given:  Yes     [name] 07/28/2019, 2:16 PM

## 2019-07-29 ENCOUNTER — Inpatient Hospital Stay (HOSPITAL_COMMUNITY): Payer: Medicare Other | Admitting: Anesthesiology

## 2019-07-29 ENCOUNTER — Encounter (HOSPITAL_COMMUNITY)
Admission: EM | Disposition: A | Discharge status: Home Health | Payer: Self-pay | Source: Home / Self Care | Attending: Internal Medicine

## 2019-07-29 ENCOUNTER — Encounter (HOSPITAL_COMMUNITY): Payer: Self-pay | Admitting: *Deleted

## 2019-07-29 DIAGNOSIS — M86171 Other acute osteomyelitis, right ankle and foot: Secondary | ICD-10-CM

## 2019-07-29 DIAGNOSIS — E440 Moderate protein-calorie malnutrition: Secondary | ICD-10-CM

## 2019-07-29 DIAGNOSIS — M86272 Subacute osteomyelitis, left ankle and foot: Secondary | ICD-10-CM

## 2019-07-29 DIAGNOSIS — M24562 Contracture, left knee: Secondary | ICD-10-CM

## 2019-07-29 HISTORY — PX: AMPUTATION: SHX166

## 2019-07-29 LAB — CBC
HCT: 35.4 % — ABNORMAL LOW (ref 39.0–52.0)
Hemoglobin: 11.5 g/dL — ABNORMAL LOW (ref 13.0–17.0)
MCH: 25.1 pg — ABNORMAL LOW (ref 26.0–34.0)
MCHC: 32.5 g/dL (ref 30.0–36.0)
MCV: 77.1 fL — ABNORMAL LOW (ref 80.0–100.0)
Platelets: 308 10*3/uL (ref 150–400)
RBC: 4.59 MIL/uL (ref 4.22–5.81)
RDW: 16.3 % — ABNORMAL HIGH (ref 11.5–15.5)
WBC: 8.4 10*3/uL (ref 4.0–10.5)
nRBC: 0 % (ref 0.0–0.2)

## 2019-07-29 LAB — BASIC METABOLIC PANEL
Anion gap: 8 (ref 5–15)
BUN: 11 mg/dL (ref 8–23)
CO2: 23 mmol/L (ref 22–32)
Calcium: 8.6 mg/dL — ABNORMAL LOW (ref 8.9–10.3)
Chloride: 109 mmol/L (ref 98–111)
Creatinine, Ser: 1.25 mg/dL — ABNORMAL HIGH (ref 0.61–1.24)
GFR calc Af Amer: >60 mL/min (ref >60)
GFR calc non Af Amer: 59 mL/min — ABNORMAL LOW (ref >60)
Glucose, Bld: 98 mg/dL (ref 70–99)
Potassium: 3.8 mmol/L (ref 3.5–5.1)
Sodium: 140 mmol/L (ref 135–145)

## 2019-07-29 LAB — SURGICAL PCR SCREEN
MRSA, PCR: NEGATIVE
Staphylococcus aureus: NEGATIVE

## 2019-07-29 LAB — GLUCOSE, CAPILLARY
Glucose-Capillary: 103 mg/dL — ABNORMAL HIGH (ref 70–99)
Glucose-Capillary: 91 mg/dL (ref 70–99)
Glucose-Capillary: 105 mg/dL — ABNORMAL HIGH (ref 70–99)
Glucose-Capillary: 111 mg/dL — ABNORMAL HIGH (ref 70–99)

## 2019-07-29 SURGERY — AMPUTATION, ABOVE KNEE
Anesthesia: General | Site: Knee | Laterality: Left

## 2019-07-29 MED ORDER — ONDANSETRON HCL 4 MG/2ML IJ SOLN: 4.0000 mg | Freq: Four times a day (QID) | INTRAMUSCULAR | Status: DC | PRN

## 2019-07-29 MED ORDER — FENTANYL CITRATE (PF) 100 MCG/2ML IJ SOLN
INTRAMUSCULAR | Status: DC | PRN
  Administered 2019-07-29: 100 ug via INTRAVENOUS
  Administered 2019-07-29: 50 ug via INTRAVENOUS

## 2019-07-29 MED ORDER — MIDAZOLAM HCL 5 MG/5ML IJ SOLN
INTRAMUSCULAR | Status: DC | PRN
  Administered 2019-07-29: 2 mg via INTRAVENOUS

## 2019-07-29 MED ORDER — HYDROMORPHONE HCL 1 MG/ML IJ SOLN: 0.5000 mg | INTRAMUSCULAR | Status: DC | PRN

## 2019-07-29 MED ORDER — ALBUMIN HUMAN 5 % IV SOLN
INTRAVENOUS | Status: AC
  Filled 2019-07-29: qty 250

## 2019-07-29 MED ORDER — MIDAZOLAM HCL 2 MG/2ML IJ SOLN
INTRAMUSCULAR | Status: AC
  Filled 2019-07-29: qty 2

## 2019-07-29 MED ORDER — METOCLOPRAMIDE HCL 5 MG/ML IJ SOLN: 5.0000 mg | Freq: Three times a day (TID) | INTRAMUSCULAR | Status: DC | PRN

## 2019-07-29 MED ORDER — ONDANSETRON HCL 4 MG/2ML IJ SOLN
INTRAMUSCULAR | Status: AC
  Filled 2019-07-29: qty 2

## 2019-07-29 MED ORDER — OXYCODONE HCL 5 MG PO TABS: 10.0000 mg | ORAL_TABLET | ORAL | Status: DC | PRN

## 2019-07-29 MED ORDER — CELECOXIB 200 MG PO CAPS
400.0000 mg | ORAL_CAPSULE | Freq: Once | ORAL | Status: AC
  Administered 2019-07-29: 400 mg via ORAL
  Filled 2019-07-29: qty 2

## 2019-07-29 MED ORDER — PROPOFOL 10 MG/ML IV BOLUS
INTRAVENOUS | Status: AC
  Filled 2019-07-29: qty 20

## 2019-07-29 MED ORDER — 0.9 % SODIUM CHLORIDE (POUR BTL) OPTIME
TOPICAL | Status: DC | PRN
  Administered 2019-07-29: 1000 mL

## 2019-07-29 MED ORDER — HYDROMORPHONE HCL 1 MG/ML IJ SOLN: 0.2500 mg | INTRAMUSCULAR | Status: DC | PRN

## 2019-07-29 MED ORDER — CHLORHEXIDINE GLUCONATE 4 % EX LIQD
CUTANEOUS | Status: AC
  Filled 2019-07-29: qty 15

## 2019-07-29 MED ORDER — LIDOCAINE 2% (20 MG/ML) 5 ML SYRINGE
INTRAMUSCULAR | Status: AC
  Filled 2019-07-29: qty 5

## 2019-07-29 MED ORDER — LACTATED RINGERS IV SOLN
INTRAVENOUS | Status: DC
  Administered 2019-07-29: 12:00:00 via INTRAVENOUS

## 2019-07-29 MED ORDER — DEXAMETHASONE SODIUM PHOSPHATE 10 MG/ML IJ SOLN
INTRAMUSCULAR | Status: AC
  Filled 2019-07-29: qty 1

## 2019-07-29 MED ORDER — VANCOMYCIN HCL IN DEXTROSE 1-5 GM/200ML-% IV SOLN
1000.0000 mg | INTRAVENOUS | Status: DC
  Administered 2019-07-29: 1000 mg via INTRAVENOUS
  Filled 2019-07-29 (×2): qty 200

## 2019-07-29 MED ORDER — FENTANYL CITRATE (PF) 250 MCG/5ML IJ SOLN
INTRAMUSCULAR | Status: AC
  Filled 2019-07-29: qty 5

## 2019-07-29 MED ORDER — LACTATED RINGERS IV SOLN
INTRAVENOUS | Status: DC
  Administered 2019-07-29 – 2019-07-30 (×2): via INTRAVENOUS

## 2019-07-29 MED ORDER — PHENYLEPHRINE 40 MCG/ML (10ML) SYRINGE FOR IV PUSH (FOR BLOOD PRESSURE SUPPORT)
PREFILLED_SYRINGE | INTRAVENOUS | Status: AC
  Filled 2019-07-29: qty 10

## 2019-07-29 MED ORDER — PHENYLEPHRINE 40 MCG/ML (10ML) SYRINGE FOR IV PUSH (FOR BLOOD PRESSURE SUPPORT)
PREFILLED_SYRINGE | INTRAVENOUS | Status: DC | PRN
  Administered 2019-07-29 (×3): 80 ug via INTRAVENOUS

## 2019-07-29 MED ORDER — ACETAMINOPHEN 500 MG PO TABS
1000.0000 mg | ORAL_TABLET | Freq: Once | ORAL | Status: AC
  Administered 2019-07-29: 1000 mg via ORAL
  Filled 2019-07-29: qty 2

## 2019-07-29 MED ORDER — ONDANSETRON HCL 4 MG/2ML IJ SOLN
INTRAMUSCULAR | Status: DC | PRN
  Administered 2019-07-29: 4 mg via INTRAVENOUS

## 2019-07-29 MED ORDER — DOCUSATE SODIUM 100 MG PO CAPS
100.0000 mg | ORAL_CAPSULE | Freq: Two times a day (BID) | ORAL | Status: DC
  Administered 2019-07-29 – 2019-08-04 (×10): 100 mg via ORAL
  Filled 2019-07-29 (×12): qty 1

## 2019-07-29 MED ORDER — PROPOFOL 10 MG/ML IV BOLUS
INTRAVENOUS | Status: DC | PRN
  Administered 2019-07-29: 130 mg via INTRAVENOUS

## 2019-07-29 MED ORDER — ALBUMIN HUMAN 5 % IV SOLN
12.5000 g | Freq: Once | INTRAVENOUS | Status: AC
  Administered 2019-07-29: 12.5 g via INTRAVENOUS

## 2019-07-29 MED ORDER — CEFAZOLIN SODIUM-DEXTROSE 1-4 GM/50ML-% IV SOLN: 1.0000 g | Freq: Four times a day (QID) | INTRAVENOUS | Status: DC

## 2019-07-29 MED ORDER — DEXAMETHASONE SODIUM PHOSPHATE 10 MG/ML IJ SOLN
INTRAMUSCULAR | Status: DC | PRN
  Administered 2019-07-29: 4 mg via INTRAVENOUS

## 2019-07-29 MED ORDER — METOCLOPRAMIDE HCL 5 MG PO TABS: 5.0000 mg | ORAL_TABLET | Freq: Three times a day (TID) | ORAL | Status: DC | PRN

## 2019-07-29 MED ORDER — ONDANSETRON HCL 4 MG PO TABS: 4.0000 mg | ORAL_TABLET | Freq: Four times a day (QID) | ORAL | Status: DC | PRN

## 2019-07-29 MED ORDER — LIDOCAINE 2% (20 MG/ML) 5 ML SYRINGE
INTRAMUSCULAR | Status: DC | PRN
  Administered 2019-07-29: 100 mg via INTRAVENOUS

## 2019-07-29 MED ORDER — PROMETHAZINE HCL 25 MG/ML IJ SOLN: 6.2500 mg | INTRAMUSCULAR | Status: DC | PRN

## 2019-07-29 SURGICAL SUPPLY — 42 items
BLADE SAW RECIP 87.9 MT (BLADE) ×2 IMPLANT
BLADE SURG 21 STRL SS (BLADE) ×2 IMPLANT
BNDG COHESIVE 6X5 TAN STRL LF (GAUZE/BANDAGES/DRESSINGS) ×2 IMPLANT
CANISTER WOUND CARE 500ML ATS (WOUND CARE) IMPLANT
COVER SURGICAL LIGHT HANDLE (MISCELLANEOUS) ×2 IMPLANT
COVER WAND RF STERILE (DRAPES) IMPLANT
CUFF TOURN SGL QUICK 34 (TOURNIQUET CUFF)
CUFF TRNQT CYL 34X4.125X (TOURNIQUET CUFF) IMPLANT
DRAPE INCISE IOBAN 66X45 STRL (DRAPES) ×4 IMPLANT
DRAPE U-SHAPE 47X51 STRL (DRAPES) ×2 IMPLANT
DRESSING PREVENA PLUS CUSTOM (GAUZE/BANDAGES/DRESSINGS) ×1 IMPLANT
DRSG PREVENA PLUS CUSTOM (GAUZE/BANDAGES/DRESSINGS) ×2
DURAPREP 26ML APPLICATOR (WOUND CARE) ×2 IMPLANT
ELECT REM PT RETURN 9FT ADLT (ELECTROSURGICAL) ×2
ELECTRODE REM PT RTRN 9FT ADLT (ELECTROSURGICAL) ×1 IMPLANT
GLOVE BIOGEL PI IND STRL 7.5 (GLOVE) ×1 IMPLANT
GLOVE BIOGEL PI IND STRL 9 (GLOVE) ×1 IMPLANT
GLOVE BIOGEL PI INDICATOR 7.5 (GLOVE) ×1
GLOVE BIOGEL PI INDICATOR 9 (GLOVE) ×1
GLOVE SURG ORTHO 9.0 STRL STRW (GLOVE) ×2 IMPLANT
GLOVE SURG SS PI 6.5 STRL IVOR (GLOVE) ×2 IMPLANT
GOWN STRL REUS W/ TWL LRG LVL3 (GOWN DISPOSABLE) ×1 IMPLANT
GOWN STRL REUS W/ TWL XL LVL3 (GOWN DISPOSABLE) ×2 IMPLANT
GOWN STRL REUS W/TWL LRG LVL3 (GOWN DISPOSABLE) ×2
GOWN STRL REUS W/TWL XL LVL3 (GOWN DISPOSABLE) ×4
KIT BASIN OR (CUSTOM PROCEDURE TRAY) ×2 IMPLANT
KIT TURNOVER KIT B (KITS) ×2 IMPLANT
MANIFOLD NEPTUNE II (INSTRUMENTS) ×2 IMPLANT
NS IRRIG 1000ML POUR BTL (IV SOLUTION) ×2 IMPLANT
PACK ORTHO EXTREMITY (CUSTOM PROCEDURE TRAY) ×2 IMPLANT
PAD ARMBOARD 7.5X6 YLW CONV (MISCELLANEOUS) ×2 IMPLANT
PREVENA RESTOR ARTHOFORM 46X30 (CANNISTER) ×2 IMPLANT
STAPLER VISISTAT 35W (STAPLE) ×1 IMPLANT
STOCKINETTE IMPERVIOUS LG (DRAPES) IMPLANT
SUT ETHILON 2 0 PSLX (SUTURE) ×4 IMPLANT
SUT SILK 2 0 (SUTURE) ×2
SUT SILK 2-0 18XBRD TIE 12 (SUTURE) ×1 IMPLANT
SUT VIC AB 1 CTX 36 (SUTURE) ×4
SUT VIC AB 1 CTX36XBRD ANBCTR (SUTURE) IMPLANT
TOWEL GREEN STERILE FF (TOWEL DISPOSABLE) ×2 IMPLANT
TUBE CONNECTING 20X1/4 (TUBING) ×2 IMPLANT
YANKAUER SUCT BULB TIP NO VENT (SUCTIONS) ×2 IMPLANT

## 2019-07-29 NOTE — Progress Notes (Signed)
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  X-ray of the left foot show signs suspicious for osteomyelitis it was followed with an MRI of the left foot.  It shows Soft tissue ulceration at the lateral aspect of the foot at the level of the fifth metatarsal base with underlying acute osteomyelitis of the lateral aspect of the fifth metatarsal base.  Area of non-enhancement of the soft tissues surrounding the ulceration concerning for tissue necrosis. Thickening and irregularity of the peroneus brevis tendon at its insertion on the fifth metatarsal base, likely reactive.  Marked soft tissue swelling of the dorsum of the forefoot.  Orthopedics consulted and Eliquis held for  amputation on Wednesday. Discussed with [name] at bedside and updated her. No new complaints overnight. He reports pain is well controlled.   Assessment & Plan:   Active Problems:   Dementia ([place])   Hyperlipidemia   Hypertension   Paroxysmal atrial fibrillation (HCC)   Foot ulcer (HCC)   Osteomyelitis (HCC)   Protein-calorie malnutrition, severe (HCC)   PVD (peripheral vascular disease) (HCC)   Malnutrition of moderate degree   Knee contracture, left  Sepsis probably secondary to osteomyelitis. -Sepsis pathology improving, lactic acid normalized, blood pressure improved, tachycardia improved. -Osteomyelitis of the fifth metatarsal bone on the left -Patient was started on IV vancomycin and IV Zosyn. -Orthopedics consulted, pt now s/p L AKA -Continue analgesics as tolerated  History of CVA -Continue with aspirin and Lipitor 40 mg daily  Essential hypertension  -Well controlled.  -Stable at present  Moderate protein calorie malnutrition -Patient is currently on feeding supplements.  Microcytic anemia: -Iron level of 49 -Hgb stable at 11.5  History of dementia -Pt Is calm and able to answering simple questions and following commands.  -Stable at this time. Conversing appropriately  Paroxysmal atrial fibrillation -NSR noted on telemetry at time of presentation -Continue rate control with amiodarone.   -Had been continued on Eliquis for anticoagulation which was held for surgery today -Would resume eliquis when OK with Orthopedic Surgery -Echocardiogram done  shows Left ventricular ejection fraction, by visual estimation, is 60 to 65%. The left ventricle has normal function. There is mildly increased left ventricular hypertrophy. Global right ventricle has normal systolic function.The right ventricular size is normal. No increase in right ventricular wall thickness.  Positive HCV antibody -HCV RNA 431000.   Elevated AST at 70, normal alk phos and normal ALT.  Bilirubin at 0.2 liver is grossly normal.  -Recommend outpatient follow up with ID/ GI for HCV treatment.   -Ultrasound incidental finding of Abdominal aortic diameter of 2.8 cm. Ectatic abdominal aorta at risk for aneurysm development. Recommend followup by ultrasound in 5 years.   H/o PVD;  -Pt had undergone aortogram with lower extremity in 03/2019 by Dr Trula Slade  Showing Bilateral superficial femoral artery occlusions without reconstitution of a named distal vessel.  -Pt noted to be not a candidate for revascularization either open or percutaneous.  DVT prophylaxis: Heparin subq Code Status: Full Family Communication: Pt in room, family not at bedside Disposition Plan: Uncertain at this time  Consultants:   Orthopedic Surgery  Procedures:   L AKA 11/25  Antimicrobials: Anti-infectives (From admission, onward)   Start  Dose/Rate Route Frequency Ordered Stop    07/29/19 2200  vancomycin (VANCOCIN) IVPB 1000 mg/200 mL premix     1,000 mg 200 mL/hr over 60 Minutes Intravenous Every 24 hours 07/29/19 1525     07/29/19 1600  ceFAZolin (ANCEF) IVPB 1 g/50 mL premix  Status:  Discontinued     1 g 100 mL/hr over 30 Minutes Intravenous Every 6 hours 07/29/19 1556 07/29/19 1600   07/29/19 0830  ceFAZolin (ANCEF) IVPB 2g/100 mL premix     2 g 200 mL/hr over 30 Minutes Intravenous To [facility] 07/28/19 2010 07/29/19 1248   07/26/19 2200  vancomycin (VANCOCIN) 1,250 mg in sodium chloride 0.9 % 250 mL IVPB  Status:  Discontinued     1,250 mg 166.7 mL/hr over 90 Minutes Intravenous Every 24 hours 07/25/19 2012 07/29/19 1526   07/25/19 2100  piperacillin-tazobactam (ZOSYN) IVPB 3.375 g     3.375 g 12.5 mL/hr over 240 Minutes Intravenous Every 8 hours 07/25/19 2012     07/25/19 2015  vancomycin (VANCOCIN) 1,500 mg in sodium chloride 0.9 % 500 mL IVPB     1,500 mg 250 mL/hr over 120 Minutes Intravenous  Once 07/25/19 2012 07/26/19 0134       Subjective: Without complaints this AM  Objective: Vitals:   07/29/19 1530 07/29/19 1545 07/29/19 1600 07/29/19 1630  BP: 122/63 120/64 (!) 108/57 (!) 115/58  Pulse: 64 65 63 61  Resp:      Temp:      TempSrc:      SpO2:      Weight:      Height:        Intake/Output Summary (Last 24 hours) at 07/29/2019 1821 Last data filed at 07/29/2019 1614 Gross per 24 hour  Intake 2197.38 ml  Output 1400 ml  Net 797.38 ml   Filed Weights   07/28/19 0244 07/29/19 0327 07/29/19 1204  Weight: 66.4 kg 65.3 kg 65.3 kg    Examination:  General exam: Appears calm and comfortable  Respiratory system: Clear to auscultation. Respiratory effort normal. Cardiovascular system: S1 & S2 heard, Regular Gastrointestinal system: Abdomen is nondistended, soft and nontender. No organomegaly or masses felt. Normal bowel sounds heard. Central nervous system: Alert and oriented. No focal neurological deficits.  Extremities: Symmetric 5 x 5 power. Skin: No rashes, lesions or ulcers Psychiatry: seems pleasantly confused   Data Reviewed: I have personally reviewed following labs and imaging studies  CBC: Recent Labs  Lab 07/25/19 1546 07/26/19 0347 07/27/19 0417 07/29/19 0440  WBC 9.2 8.7 7.9 8.4  NEUTROABS 6.9  --   --   --   HGB 13.3 13.1 11.0* 11.5*  HCT 41.7 40.4 33.6* 35.4*  MCV 79.4* 78.6* 76.5* 77.1*  PLT 317 302 279 470   Basic Metabolic Panel: Recent Labs  Lab 07/25/19 1546 07/26/19 0347 07/27/19 0417 07/29/19 0440  NA 139 139 137 140  K 4.0 3.8 3.6 3.8  CL 109 110 110 109  CO2 20* 20* 21* 23  GLUCOSE 129* 86 98 98  BUN '18 16 15 11  ' CREATININE 1.53* 1.14 1.04 1.25*  CALCIUM 9.0 8.8* 7.9* 8.6*   GFR: Estimated Creatinine Clearance: 51.7 mL/min (A) (by C-G formula based on SCr of 1.25 mg/dL (H)). Liver Function Tests: Recent Labs  Lab 07/25/19 1546 07/26/19 0347  AST 63* 70*  ALT 41 42  ALKPHOS 63 56  BILITOT 0.4 0.2*  PROT 6.7 6.4*  ALBUMIN 2.6* 2.3*   No results for input(s): LIPASE, AMYLASE  in the last 168 hours. No results for input(s): AMMONIA in the last 168 hours. Coagulation Profile: No results for input(s): INR, PROTIME in the last 168 hours. Cardiac Enzymes: No results for input(s): CKTOTAL, CKMB, CKMBINDEX, TROPONINI in the last 168 hours. BNP (last 3 results) No results for input(s): PROBNP in the last 8760 hours. HbA1C: No results for input(s): HGBA1C in the last 72 hours. CBG: Recent Labs  Lab 07/28/19 1621 07/28/19 2049 07/29/19 0608 07/29/19 1128 07/29/19 1607  GLUCAP 103* 241* 103* 91 105*   Lipid Profile: No results for input(s): CHOL, HDL, LDLCALC, TRIG, CHOLHDL, LDLDIRECT in the last 72 hours. Thyroid Function Tests: No results for input(s): TSH, T4TOTAL, FREET4, T3FREE, THYROIDAB in the last 72 hours. Anemia Panel: Recent Labs    07/28/19 1751  VITAMINB12 304  FOLATE 27.0  FERRITIN 518*  TIBC 218*  IRON 49   RETICCTPCT 0.7   Sepsis Labs: Recent Labs  Lab 07/25/19 1546 07/25/19 1735 07/26/19 1233 07/26/19 1657 07/27/19 0417 07/28/19 0505  PROCALCITON  --   --   --  0.23 0.25 0.46  LATICACIDVEN 3.5* 3.3* 1.9  --   --   --     Recent Results (from the past 240 hour(s))  Wound or Superficial Culture     Status: Abnormal   Collection Time: 07/25/19  4:28 PM   Specimen: Wound  Result Value Ref Range Status   Specimen Description WOUND  Final   Special Requests Normal  Final   Gram Stain   Final    RARE WBC PRESENT, PREDOMINANTLY PMN MODERATE GRAM POSITIVE RODS FEW GRAM POSITIVE COCCI FEW GRAM NEGATIVE RODS Performed at [facility], [address], [place], [place] [address]    Culture MULTIPLE ORGANISMS PRESENT, NONE PREDOMINANT (A)  Final   Report Status 07/27/2019 FINAL  Final  SARS CORONAVIRUS 2 (TAT 6-24 HRS) Nasopharyngeal Nasopharyngeal Swab     Status: None   Collection Time: 07/25/19  8:38 PM   Specimen: Nasopharyngeal Swab  Result Value Ref Range Status   SARS Coronavirus 2 NEGATIVE NEGATIVE Final    Comment: (NOTE) SARS-CoV-2 target nucleic acids are NOT DETECTED. The SARS-CoV-2 RNA is generally detectable in upper and lower respiratory specimens during the acute phase of infection. Negative results do not preclude SARS-CoV-2 infection, do not rule out co-infections with other pathogens, and should not be used as the sole basis for treatment or other patient management decisions. Negative results must be combined with clinical observations, patient history, and epidemiological information. The expected result is Negative. Fact Sheet for Patients: SugarRoll.be Fact Sheet for Healthcare Providers: https://www.woods-mathews.com/ This test is not yet approved or cleared by the [place] FDA and  has been authorized for detection and/or diagnosis of SARS-CoV-2 by FDA under an Emergency Use Authorization (EUA). This EUA  will remain  in effect (meaning this test can be used) for the duration of the COVID-19 declaration under Section 56 4(b)(1) of the Act, 21 U.S.C. section 360bbb-3(b)(1), unless the authorization is terminated or revoked sooner. Performed at [facility], [place] [address] [place], [place] [address]   Surgical pcr screen     Status: None   Collection Time: 07/29/19  4:37 AM   Specimen: Nasal Mucosa; Nasal Swab  Result Value Ref Range Status   MRSA, PCR NEGATIVE NEGATIVE Final   Staphylococcus aureus NEGATIVE NEGATIVE Final    Comment: (NOTE) The Xpert SA Assay (FDA approved for NASAL specimens in patients 20 years of age and  older), is one component of a comprehensive surveillance program. It is not intended to diagnose infection nor to guide or monitor treatment. Performed at [facility], [place] [address] [place], [place] [address]      Radiology Studies: No results found.  Scheduled Meds: . amiodarone  200 mg Oral BID  . aspirin EC  81 mg Oral Daily  . atorvastatin  40 mg Oral QHS  . chlorhexidine      . docusate sodium  100 mg Oral BID  . feeding supplement (ENSURE ENLIVE)  237 mL Oral TID BM  . folic acid  1 mg Oral Daily  . Gerhardt's butt cream  1 application Topical Daily  . heparin injection (subcutaneous)  5,000 Units Subcutaneous Q8H  . hydrALAZINE  25 mg Oral Q8H  . multivitamin with minerals  1 tablet Oral Daily  . sodium chloride flush  3 mL Intravenous Once   Continuous Infusions: . lactated ringers 10 mL/hr at 07/29/19 1613  . lactated ringers 10 mL/hr at 07/29/19 1222  . piperacillin-tazobactam (ZOSYN)  IV 3.375 g (07/29/19 5300)  . vancomycin       LOS: 4 days   Marylu Lund, MD [facility] Pager On Amion  If 7PM-7AM, please contact night-coverage 07/29/2019, 6:21 PM

## 2019-07-29 NOTE — Anesthesia Procedure Notes (Signed)
Procedure Name: LMA Insertion Date/Time: 07/29/2019 12:58 PM Performed by: Moshe Salisbury, CRNA Pre-anesthesia Checklist: Patient identified, Emergency Drugs available, Suction available and Patient being monitored Patient Re-evaluated:Patient Re-evaluated prior to induction Oxygen Delivery Method: Circle System Utilized Preoxygenation: Pre-oxygenation with 100% oxygen Induction Type: IV induction Ventilation: Mask ventilation without difficulty LMA: LMA inserted LMA Size: 4.0 Number of attempts: 1 Placement Confirmation: positive ETCO2 Tube secured with: Tape Dental Injury: Teeth and Oropharynx as per pre-operative assessment

## 2019-07-29 NOTE — Transfer of Care (Signed)
Immediate Anesthesia Transfer of Care Note  Patient: [name]  Procedure(s) Performed: LEFT ABOVE KNEE AMPUTATION (Left Knee)  Patient Location: PACU  Anesthesia Type:General  Level of Consciousness: drowsy and patient cooperative  Airway & Oxygen Therapy: Patient Spontanous Breathing and Patient connected to nasal cannula oxygen  Post-op Assessment: Report given to RN and Post -op Vital signs reviewed and stable  Post vital signs: Reviewed and stable  Last Vitals:  Vitals Value Taken Time  BP 89/52 07/29/19 1347  Temp    Pulse 60 07/29/19 1346  Resp 13 07/29/19 1347  SpO2 100 % 07/29/19 1346  Vitals shown include unvalidated device data.  Last Pain:  Vitals:   07/29/19 1131  TempSrc: Oral  PainSc:       Patients Stated Pain Goal: 2 ([id number])  Complications: No apparent anesthesia complications

## 2019-07-29 NOTE — Progress Notes (Signed)
Called main OR to inform them [name] needs to speak with the surgeon before she will give consent. Stated they will alert the surgeon upon his arrival.

## 2019-07-29 NOTE — Op Note (Signed)
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 male with a diagnosis of Osteomyelitis Left Foot who failed conservative measures and elected for surgical management.    The risks benefits and alternatives were discussed with the patient preoperatively including but not limited to the risks of infection, bleeding, nerve injury, cardiopulmonary complications, the need for revision surgery, among others, and the patient was willing to proceed.  OPERATIVE IMPLANTS: Arthroform and customizable VAC dressing  @ENCIMAGES @  OPERATIVE FINDINGS: Calcified vessels with good petechial bleeding no abscess  OPERATIVE PROCEDURE: Patient was brought the operating room underwent a general anesthetic.  After adequate levels anesthesia were obtained patient's left lower extremity was prepped using DuraPrep draped into a sterile field a timeout was called.  A fishmouth incision was made just proximal to the quad tendon.  This was carried down to the medial vascular bundle this was clamped and suture ligated with 2-0 silk.  A reciprocating saw was used to amputate the femur.  Electrocautery was used for remainder of the muscle amputation due to the calcified vessels.  The fishmouth incision was completed the sciatic nerve was pulled cut and allowed to retract.  Hemostasis was obtained.  There is no signs of infection but patient did have significant calcified vessels.  The deep and superficial fascia layers were closed using #2 Vicryl skin was closed using staples.  A customizable and arthroform wound VAC dressing was applied this had a good suction fit  patient was extubated taken the PACU in stable condition.   DISCHARGE PLANNING:  Antibiotic duration: Continue antibiotics for 24 hours  Weightbearing: Transfer training only  Pain medication: Opioid pathway  Dressing care/ Wound VAC: Continue wound VAC for 1 week  Ambulatory devices: Not applicable  Discharge to: Skilled nursing facility  Follow-up: In the office 1 week post operative.

## 2019-07-29 NOTE — Interval H&P Note (Signed)
History and Physical Interval Note:  07/29/2019 6:54 AM  [name]  has presented today for surgery, with the diagnosis of Osteomyelitis Left Foot.  The various methods of treatment have been discussed with the patient and family. After consideration of risks, benefits and other options for treatment, the patient has consented to  Procedure(s): LEFT ABOVE KNEE AMPUTATION (Left) as a surgical intervention.  The patient's history has been reviewed, patient examined, no change in status, stable for surgery.  I have reviewed the patient's chart and labs.  Questions were answered to the patient's satisfaction.     Newt Minion

## 2019-07-29 NOTE — Progress Notes (Signed)
Pharmacy Antibiotic Note  [name] is a 68 y.o. male admitted on 07/25/2019 with L foot cellulitis/osteomyelitis now s/p AKA.  Pharmacy has been consulted for vancomycin and Zosyn dosing.  Scr trended up slightly but charted weight decreased 10kg. Recalculated based on this and will decrease dose. Continue to monitor and adjust accordingly.   11/25 Vancomycin 1000mg  Q 24 hr Scr used: 1.25 Wt: 65.3 kg Est AUC: 449   11/22 Vancomycin 1250mg  Q24h Scr 1.14 AUC 447.7  11/21 Vancomycin 1250mg  Q24hr  SCr 1.53 Wt: 75 kg AUC 503   Plan: Decrease vancomycin to 1000mg  IV Q24H  Continue Zosyn EID 3.375gm IV Q8H Monitor renal fxn, clinical progress, vanc AUC as indicated' F/u duration    Height: 5\' 6"  (167.6 cm) Weight: 143 lb 15.4 oz (65.3 kg) IBW/kg (Calculated) : 63.8  Temp (24hrs), Avg:98.4 F (36.9 C), Min:97.9 F (36.6 C), Max:98.9 F (37.2 C)  Recent Labs  Lab 07/25/19 1546 07/25/19 1735 07/26/19 0347 07/26/19 1233 07/27/19 0417 07/29/19 0440  WBC 9.2  --  8.7  --  7.9 8.4  CREATININE 1.53*  --  1.14  --  1.04 1.25*  LATICACIDVEN 3.5* 3.3*  --  1.9  --   --     Estimated Creatinine Clearance: 51.7 mL/min (A) (by C-G formula based on SCr of 1.25 mg/dL (H)).    No Known Allergies   Vanc 11/21 >> Zosyn 11/21 >>  11/21 covid neg 11/21 wound cx (superficial) - GPR / GPC / GNR on Gram stain  Benetta Spar, PharmD, BCPS, BCCP Clinical Pharmacist  Please check AMION for all [facility] phone numbers After 10:00 PM, call [place] [phone]

## 2019-07-29 NOTE — Anesthesia Preprocedure Evaluation (Addendum)
Anesthesia Evaluation  Patient identified by MRN, date of birth, ID band Patient awake    Reviewed: Allergy & Precautions, NPO status , Patient's Chart, lab work & pertinent test results  History of Anesthesia Complications Negative for: history of anesthetic complications  Airway Mallampati: II  TM Distance: >3 FB Neck ROM: Full    Dental  (+) Poor Dentition, Missing, Edentulous Upper, Dental Advisory Given   Pulmonary former smoker,    Pulmonary exam normal        Cardiovascular hypertension, + CAD, + CABG and + Peripheral Vascular Disease  Normal cardiovascular exam+ dysrhythmias Atrial Fibrillation      Neuro/Psych PSYCHIATRIC DISORDERS Dementia Left foot drop CVA, Residual Symptoms    GI/Hepatic negative GI ROS, (+)     substance abuse  alcohol use,   Endo/Other  negative endocrine ROS  Renal/GU Renal InsufficiencyRenal disease     Musculoskeletal negative musculoskeletal ROS (+)   Abdominal   Peds  Hematology HLD   Anesthesia Other Findings   Reproductive/Obstetrics                            Anesthesia Physical  Anesthesia Plan  ASA: III  Anesthesia Plan: General   Post-op Pain Management:    Induction: Intravenous  PONV Risk Score and Plan: 2 and Ondansetron, Dexamethasone, Midazolam and Treatment may vary due to age or medical condition  Airway Management Planned: LMA and Oral ETT  Additional Equipment:   Intra-op Plan:   Post-operative Plan: Extubation in OR  Informed Consent: I have reviewed the patients History and Physical, chart, labs and discussed the procedure including the risks, benefits and alternatives for the proposed anesthesia with the patient or authorized representative who has indicated his/her understanding and acceptance.     Dental advisory given  Plan Discussed with: CRNA and Anesthesiologist  Anesthesia Plan Comments: (68 year old male  with coronary artery disease status post CABG, prior strokes, dementia, PAF,  critical limb ischemia with recent angiogram showing b/l SFA occlusion without constitution of a named distal vessel. He was deemed not to be a candidate for any revascularization by Dr. Trula Slade.   Patient was seen by cardiologist Dr. Virgina Jock on 04/24/19. Per his note "His baseline functional capacity Is extremely limited with previous strokes dementia. He is wheelchair bound and has a living will stating he does not want any life prolonging measures." At this point Dr. Virgina Jock has recommended PRN followup.  Per Dr. Rayna Sexton ntoe 04/27/19 "Nonhealing right foot ulcer: The patient does not have options for revascularization.  We have tried to treat him conservatively with antibiotics however this has failed.  Our only option now is amputation.  I discussed below-knee versus above-knee.  The patient would prefer an above-knee amputation."  EKG 04/24/2019: Probable sinus rhythm 70 bpm. Nonspecific ST-T changes anteroalteral leads.   Echocardiogram 01/17/2018: Mild concentric LVH. LVEF 64%. Normal diastolic function. No significant valvular abnormality. No evidence of pulmonary hypertension.  ABI 12/26/17: R ABI 0.92 L ABI 0.90  Nuclear stress test 2016: Impression Exercise Capacity: Lexiscan with no exercise. BP Response: Normal blood pressure response. Clinical Symptoms: There is dyspnea. ECG Impression: No significant ST segment change suggestive of ischemia. Comparison with Prior Nuclear Study: Compared to 11/25/09, mild ischemia new.   Overall Impression: Intermediate risk stress nuclear study with a large, severe, partially reversible anterior lateral defect consistent with prior infarct and very mild peri-infarct ischemia. Study intermediate risk due to reduced LV function.  LV Ejection Fraction: 42%. LV Wall Motion: Lateral akinesis.  )       Anesthesia Quick Evaluation

## 2019-07-29 NOTE — Patient Care Conference (Signed)
Called and updated patient's niece. All questions answered.

## 2019-07-29 NOTE — Progress Notes (Signed)
Nutrition Follow-up  DOCUMENTATION CODES:   Non-severe (moderate) malnutrition in context of chronic illness  INTERVENTION:   -MVI with minerals daily -Continue Ensure Enlive po TID, each supplement provides 350 kcal and 20 grams of protein -Downgrade diet to dysphagia 3 (advanced mechanical soft) for ease of intake -Continue Magic cup TID with meals, each supplement provides 290 kcal and 9 grams of protein  NUTRITION DIAGNOSIS:   Moderate Malnutrition related to chronic illness(CVA, dementia) as evidenced by percent weight loss, mild fat depletion, moderate fat depletion, mild muscle depletion, moderate muscle depletion.  Ongoing  GOAL:   Patient will meet greater than or equal to 90% of their needs  Progressing   MONITOR:   PO intake, Supplement acceptance, Labs, Weight trends, Skin, I & O's  REASON FOR ASSESSMENT:   Consult Assessment of nutrition requirement/status  ASSESSMENT:   [name]  is a 68 y.o. male, w dementia, hypertension, hyperlipidemia, CAD, Pafib, h/o CVA, PAD s/p R AKA 05/08/2019 apparently presents with wound on the lateral aspect of the left foot . Pt is uncertain how long has been there.  Pt denies fever, chills. Pt appears to be a poor historian.  Per RN notes niece POA noticed wound yesterday.  11/25- s/p PROCEDURE:  LEFT ABOVE KNEE AMPUTATION With application of Praveena arthroform and customizable VAC dressing  Attempted to speak with pt via phone, however, no answer.   Pt with poor appetite pre-operatively. Noted meal completion 15-25%. Pt has been consuming Ensure supplements per MAR. Pt with poor oral intake and would benefit from nutrient dense supplement. One Ensure Enlive supplement provides 350 kcals, 20 grams protein, and 44-45 grams of carbohydrate vs one Glucerna shake supplement, which provides 220 kcals, 10 grams of protein, and 26 grams of carbohydrate. Given pt's hx of DM, RD will continue to monitor PO intake, CBGS, and adjust  supplement regimen as appropriate.   Medications reviewed and include lactated ringers @ 10 ml/hr.  Labs reviewed: CBGS: 103-241  Diet Order:   Diet Order            Diet Carb Modified Fluid consistency: Thin; Room service appropriate? Yes  Diet effective now              EDUCATION NEEDS:   Education needs have been addressed  Skin:  Skin Assessment: Skin Integrity Issues: Skin Integrity Issues:: Wound VAC Stage III: - Unstageable: - Wound Vac: lt thigh incision (s/p AKA) Other: -  Last BM:  07/27/19  Height:   Ht Readings from Last 1 Encounters:  07/29/19 5\' 6"  (1.676 m)    Weight:   Wt Readings from Last 1 Encounters:  07/29/19 65.3 kg    Ideal Body Weight:  54.2 kg  BMI:  Body mass index is 23.24 kg/m.  Estimated Nutritional Needs:   Kcal:  1900-2100  Protein:  105-120 grams  Fluid:  > 1.9 L    [name] A. Jimmye Norman, RD, LDN, [place] Registered Dietitian II Certified Diabetes Care and Education Specialist Pager: [phone] After hours Pager: [phone]

## 2019-07-30 LAB — CBC
HCT: 29.5 % — ABNORMAL LOW (ref 39.0–52.0)
Hemoglobin: 9.6 g/dL — ABNORMAL LOW (ref 13.0–17.0)
MCH: 25.1 pg — ABNORMAL LOW (ref 26.0–34.0)
MCHC: 32.5 g/dL (ref 30.0–36.0)
MCV: 77 fL — ABNORMAL LOW (ref 80.0–100.0)
Platelets: 276 10*3/uL (ref 150–400)
RBC: 3.83 MIL/uL — ABNORMAL LOW (ref 4.22–5.81)
RDW: 16.4 % — ABNORMAL HIGH (ref 11.5–15.5)
WBC: 10.4 10*3/uL (ref 4.0–10.5)
nRBC: 0 % (ref 0.0–0.2)

## 2019-07-30 LAB — COMPREHENSIVE METABOLIC PANEL
ALT: 28 U/L (ref 0–44)
AST: 51 U/L — ABNORMAL HIGH (ref 15–41)
Albumin: 1.9 g/dL — ABNORMAL LOW (ref 3.5–5.0)
Alkaline Phosphatase: 49 U/L (ref 38–126)
Anion gap: 8 (ref 5–15)
BUN: 12 mg/dL (ref 8–23)
CO2: 20 mmol/L — ABNORMAL LOW (ref 22–32)
Calcium: 8.4 mg/dL — ABNORMAL LOW (ref 8.9–10.3)
Chloride: 112 mmol/L — ABNORMAL HIGH (ref 98–111)
Creatinine, Ser: 1.25 mg/dL — ABNORMAL HIGH (ref 0.61–1.24)
GFR calc Af Amer: >60 mL/min (ref >60)
GFR calc non Af Amer: 59 mL/min — ABNORMAL LOW (ref >60)
Glucose, Bld: 141 mg/dL — ABNORMAL HIGH (ref 70–99)
Potassium: 3.8 mmol/L (ref 3.5–5.1)
Sodium: 140 mmol/L (ref 135–145)
Total Bilirubin: 0.8 mg/dL (ref 0.3–1.2)
Total Protein: 5 g/dL — ABNORMAL LOW (ref 6.5–8.1)

## 2019-07-30 LAB — GLUCOSE, CAPILLARY: Glucose-Capillary: 123 mg/dL — ABNORMAL HIGH (ref 70–99)

## 2019-07-30 MED ORDER — APIXABAN 5 MG PO TABS
5.0000 mg | ORAL_TABLET | Freq: Two times a day (BID) | ORAL | Status: DC
  Administered 2019-07-30 – 2019-08-04 (×10): 5 mg via ORAL
  Filled 2019-07-30 (×10): qty 1

## 2019-07-30 MED ORDER — SODIUM CHLORIDE 0.9 % IV BOLUS
500.0000 mL | Freq: Once | INTRAVENOUS | Status: AC
  Administered 2019-07-30: 06:00:00 500 mL via INTRAVENOUS

## 2019-07-30 NOTE — Plan of Care (Signed)

## 2019-07-30 NOTE — Evaluation (Signed)
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  X-ray of the left foot show signs suspicious for osteomyelitis. Pt underwent L AKA on 11/25.  Clinical Impression  Pt demonstrates deficits in functional mobility, balance, strength, power, and cognition. Pt is a poor historian with reported history conflicting greatly from that in previous PT notes from prior admission. Pt requires significant physical assistance to perform all functional mobility. Transfer training deferred to later time as pt preparing to transfer units at end of PT session. Pt will benefit from continued acute PT services to improve bed mobility, balance, transfers, and initiate wheelchair mobility training.    Follow Up Recommendations Supervision/Assistance - 24 hour;SNF    Equipment Recommendations  Wheelchair (measurements PT);Wheelchair cushion (measurements PT)(MWC if pt does not own one already)    Recommendations for Other Services       Precautions / Restrictions Precautions Precautions: Fall Restrictions Weight Bearing Restrictions: Yes RLE Weight Bearing: Non weight bearing LLE Weight Bearing: Non weight bearing Other Position/Activity Restrictions: B AKA      Mobility  Bed Mobility Overal bed mobility: Needs Assistance Bed Mobility: Supine to Sit;Sit to Supine;Rolling Rolling: Supervision   Supine to sit: Mod assist Sit to supine: Min assist   General bed mobility comments: rolls with use of railing  Transfers Overall transfer level: (deferred as pt preparing to transfer units at end of PT eval)                  Ambulation/Gait                 Stairs            Wheelchair Mobility    Modified Rankin (Stroke Patients Only)       Balance Overall balance assessment: Needs assistance Sitting-balance support: Bilateral upper extremity supported;Feet unsupported(BUE support of bed rails) Sitting balance-Leahy Scale: Good Sitting balance - Comments: close supervision. Pt requiring minG-minA with unilateral UE support                                     Pertinent Vitals/Pain Pain Assessment: No/denies pain    Home Living Family/patient expects to be discharged to:: Private residence Living Arrangements: Spouse/significant other Available Help at Discharge: Friend(s);Available PRN/intermittently Type of Home: House Home Access: Ramped entrance     Home Layout: One level Home Equipment: Walker - 2 wheels Additional Comments: pt poor historian, cognitively altered. Past notes state pt owns [facility], HH shower, and stairs to enter home    Prior Function Level of Independence: Needs assistance   Gait / Transfers Assistance Needed: pt is poor historian and reports ambulating (not hopping) with use of RW prior to admission. Will need family to confirm history           Hand Dominance   Dominant Hand: Right    Extremity/Trunk Assessment   Upper Extremity Assessment Upper Extremity Assessment: Overall WFL for tasks assessed    Lower Extremity Assessment Lower Extremity Assessment: Generalized weakness(B AKA)    Cervical / Trunk Assessment Cervical / Trunk Assessment: Normal  Communication   Communication: No difficulties  Cognition Arousal/Alertness: Awake/alert Behavior  During Therapy: WFL for tasks assessed/performed Overall Cognitive Status: No family/caregiver present to determine baseline cognitive functioning                                 General Comments: pt does not know his birthday or current date, otherwise oriented.       General Comments  General comments (skin integrity, edema, etc.): VSS    Exercises     Assessment/Plan    PT Assessment Patient needs continued PT services  PT Problem List Decreased strength;Decreased activity tolerance;Decreased balance;Decreased mobility;Decreased cognition;Decreased knowledge of use of DME;Decreased safety awareness;Decreased knowledge of precautions       PT Treatment Interventions DME instruction;Functional mobility training;Therapeutic activities;Therapeutic exercise;Balance training;Neuromuscular re-education;Patient/family education;Wheelchair mobility training    PT Goals (Current goals can be found in the Care Plan section)  Acute Rehab PT Goals Patient Stated Goal: To mobilize PT Goal Formulation: With patient Time For Goal Achievement: 08/13/19 Potential to Achieve Goals: Good Additional Goals Additional Goal #1: Pt will mobilize in manual wheelchair for >100 feet utilizing BUE, with minA.    Frequency Min 3X/week   Barriers to discharge        Co-evaluation               AM-PAC PT "6 Clicks" Mobility  Outcome Measure Help needed turning from your back to your side while in a flat bed without using bedrails?: None Help needed moving from lying on your back to sitting on the side of a flat bed without using bedrails?: A Lot Help needed moving to and from a bed to a chair (including a wheelchair)?: A Lot Help needed standing up from a chair using your arms (e.g., wheelchair or bedside chair)?: Total Help needed to walk in hospital room?: Total Help needed climbing 3-5 steps with a railing? : Total 6 Click Score: 11    End of Session Equipment Utilized During Treatment: (none) Activity Tolerance: Patient tolerated treatment well Patient left: in bed;with call bell/phone within reach;with bed alarm set Nurse Communication: Mobility status PT Visit Diagnosis: Other abnormalities of gait and mobility (R26.89);Muscle weakness (generalized) (M62.81)    Time:  [id number] PT Time Calculation (min) (ACUTE ONLY): 23 min   Charges:   PT Evaluation $PT Eval Moderate Complexity: 1 Mod PT Treatments $Therapeutic Activity: 8-22 mins        Zenaida Niece, PT, DPT Acute Rehabilitation Pager: [phone]   Zenaida Niece 07/30/2019, 10:37 AM

## 2019-07-30 NOTE — Progress Notes (Signed)
[facility] paged patient BP 85/60 HR 57 patient void 100 mls bladder scanned @0300  140 mls. Arthor Captain LPN

## 2019-07-30 NOTE — Plan of Care (Signed)

## 2019-07-30 NOTE — Progress Notes (Signed)
Patient ID: [name], male   DOB: 1951-05-07, 68 y.o.   MRN: [id number] Patient is postoperative day 1 left above-the-knee amputation.  Patient is alert oriented he denies any pain there is no drainage in the wound VAC canister patient is stable from orthopedic standpoint for discharge to home at this time patient will need discharge with the portable Praveena wound VAC pump.

## 2019-07-30 NOTE — Significant Event (Signed)
Rapid Response Event Note  Overview: Called d/t MEWS-2. BP-85/60, and decreased LOC. NP notified by bedside RN. Plan: 500cc NS bolus and recheck BP. Please call RRT if assistance needed.     Dillard Essex

## 2019-07-30 NOTE — Progress Notes (Addendum)
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  X-ray of the left foot show signs suspicious for osteomyelitis it was followed with an MRI of the left foot.  It shows Soft tissue ulceration at the lateral aspect of the foot at the level of the fifth metatarsal base with underlying acute osteomyelitis of the lateral aspect of the fifth metatarsal base.  Area of non-enhancement of the soft tissues surrounding the ulceration concerning for tissue necrosis. Thickening and irregularity of the peroneus brevis tendon at its insertion on the fifth metatarsal base, likely reactive.  Marked soft tissue swelling of the dorsum of the forefoot.  Orthopedics consulted and Eliquis held for  amputation on Wednesday. Discussed with [name] at bedside and updated her. No new complaints overnight. He reports pain is well controlled.   Assessment & Plan:   Active Problems:   Dementia ([place])   Hyperlipidemia   Hypertension   Paroxysmal atrial fibrillation (HCC)   Foot ulcer (HCC)   Osteomyelitis (HCC)   Protein-calorie malnutrition, severe (HCC)   PVD (peripheral vascular disease) (HCC)   Malnutrition of moderate degree   Knee contracture, left  Sepsis probably secondary to osteomyelitis. -Sepsis pathology improving, lactic acid normalized, blood pressure improved, tachycardia improved. -Osteomyelitis of the fifth metatarsal bone on the left -Orthopedics consulted, pt now s/p L AKA -Continue analgesics as tolerated -Therapy recommendations for skilled nursing facility.  Will place social work consult -Patient had been continued on empiric vancomycin and Zosyn  since time of admission.   -Discussed case with Dr. Sharol Given.  Okay to discontinue antibiotics at this time  History of CVA -Continue with aspirin and Lipitor 40 mg daily -Stable at present  Essential hypertension -Patient noted to be mildly hypotensive overnight, improved with IV fluid hydration -Pain stable albeit soft -Would be conservative with analgesics if possible  Moderate protein calorie malnutrition -Patient is currently on feeding supplements.  Microcytic anemia: -Iron level of 49 -Hgb down to 9.6 from 11.5 yesterday -Patient remains hemodynamically stable.   -Will repeat CBC in the morning, plan to transfuse if hemoglobin less than 7.0  History of dementia -Pt Is calm and able to answering simple questions and following commands.  -Stable at this time. Conversing appropriately  Paroxysmal atrial fibrillation -NSR noted on telemetry at time of presentation -Continue rate control with amiodarone.   -Had been continued on Eliquis for anticoagulation which was held for surgery -Echocardiogram done  shows Left ventricular ejection fraction, by visual estimation, is 60 to 65%. The left ventricle has normal function. There is mildly increased left ventricular hypertrophy. Global right ventricle has normal systolic function.The right ventricular size is normal. No increase in right ventricular wall thickness. -Discussed case with Dr. Sharol Given, okay to resume Eliquis today  Positive HCV antibody -HCV RNA 431000.   Elevated AST at 70, normal alk phos and normal ALT.  Bilirubin at 0.2 liver is grossly normal.  -Recommend outpatient follow up with ID/ GI for HCV treatment.   -Ultrasound incidental finding of Abdominal aortic diameter of 2.8 cm. Ectatic abdominal aorta at risk for aneurysm development. Recommend followup by ultrasound in 5 years.  -Seems stable at this time  H/o PVD;  -Pt had undergone aortogram with  lower extremity in 03/2019 by Dr Trula Slade  Showing  Bilateral superficial femoral artery occlusions without reconstitution of a named distal vessel.  -Pt noted to be not a candidate for revascularization either open or percutaneous. -Currently stable  DVT prophylaxis: Heparin subq Code Status: Full Family Communication: Pt in room, family not at bedside Disposition Plan: Uncertain at this time  Consultants:   Orthopedic Surgery  Procedures:   L AKA 11/25  Antimicrobials: Anti-infectives (From admission, onward)   Start     Dose/Rate Route Frequency Ordered Stop   07/29/19 2200  vancomycin (VANCOCIN) IVPB 1000 mg/200 mL premix     1,000 mg 200 mL/hr over 60 Minutes Intravenous Every 24 hours 07/29/19 1525     07/29/19 1600  ceFAZolin (ANCEF) IVPB 1 g/50 mL premix  Status:  Discontinued     1 g 100 mL/hr over 30 Minutes Intravenous Every 6 hours 07/29/19 1556 07/29/19 1600   07/29/19 0830  ceFAZolin (ANCEF) IVPB 2g/100 mL premix     2 g 200 mL/hr over 30 Minutes Intravenous To [facility] 07/28/19 2010 07/29/19 1248   07/26/19 2200  vancomycin (VANCOCIN) 1,250 mg in sodium chloride 0.9 % 250 mL IVPB  Status:  Discontinued     1,250 mg 166.7 mL/hr over 90 Minutes Intravenous Every 24 hours 07/25/19 2012 07/29/19 1526   07/25/19 2100  piperacillin-tazobactam (ZOSYN) IVPB 3.375 g     3.375 g 12.5 mL/hr over 240 Minutes Intravenous Every 8 hours 07/25/19 2012     07/25/19 2015  vancomycin (VANCOCIN) 1,500 mg in sodium chloride 0.9 % 500 mL IVPB     1,500 mg 250 mL/hr over 120 Minutes Intravenous  Once 07/25/19 2012 07/26/19 0134      Subjective: No complaints this morning.  Objective: Vitals:   07/30/19 0750 07/30/19 1036 07/30/19 1037 07/30/19 1500  BP: (!) 106/54 (!) 95/52 (!) 98/50 (!) 98/50  Pulse: 61 63 62   Resp: 20 18    Temp: 98.5 F (36.9 C) 98.3 F (36.8 C)    TempSrc: Oral Oral    SpO2: 99% 97%    Weight:      Height:        Intake/Output Summary (Last 24 hours) at 07/30/2019 1643 Last data  filed at 07/30/2019 0500 Gross per 24 hour  Intake -  Output 100 ml  Net -100 ml   Filed Weights   07/29/19 0327 07/29/19 1204 07/30/19 0436  Weight: 65.3 kg 65.3 kg 68.1 kg    Examination: General exam: Awake, laying in bed, in nad Respiratory system: Normal respiratory effort, no wheezing Cardiovascular system: regular rate, s1, s2 Gastrointestinal system: Soft, nondistended, positive BS Central nervous system: CN2-12 grossly intact, strength intact Extremities: s/p LE amputation Skin: Normal skin turgor, no notable skin lesions seen Psychiatry: Mood normal // no visual hallucinations   Data Reviewed: I have personally reviewed following labs and imaging studies  CBC: Recent Labs  Lab 07/25/19 1546 07/26/19 0347 07/27/19 0417 07/29/19 0440 07/30/19 0316  WBC 9.2 8.7 7.9 8.4 10.4  NEUTROABS 6.9  --   --   --   --   HGB 13.3 13.1 11.0* 11.5* 9.6*  HCT 41.7 40.4 33.6* 35.4* 29.5*  MCV 79.4* 78.6* 76.5* 77.1* 77.0*  PLT 317 302 279 308 606   Basic Metabolic Panel: Recent Labs  Lab 07/25/19 1546 07/26/19 0347 07/27/19 0417 07/29/19 0440 07/30/19 0316  NA 139 139 137 140 140  K 4.0 3.8 3.6 3.8 3.8  CL 109  110 110 109 112*  CO2 20* 20* 21* 23 20*  GLUCOSE 129* 86 98 98 141*  BUN '18 16 15 11 12  ' CREATININE 1.53* 1.14 1.04 1.25* 1.25*  CALCIUM 9.0 8.8* 7.9* 8.6* 8.4*   GFR: Estimated Creatinine Clearance: 51.7 mL/min (A) (by C-G formula based on SCr of 1.25 mg/dL (H)). Liver Function Tests: Recent Labs  Lab 07/25/19 1546 07/26/19 0347 07/30/19 0316  AST 63* 70* 51*  ALT 41 42 28  ALKPHOS 63 56 49  BILITOT 0.4 0.2* 0.8  PROT 6.7 6.4* 5.0*  ALBUMIN 2.6* 2.3* 1.9*   No results for input(s): LIPASE, AMYLASE in the last 168 hours. No results for input(s): AMMONIA in the last 168 hours. Coagulation Profile: No results for input(s): INR, PROTIME in the last 168 hours. Cardiac Enzymes: No results for input(s): CKTOTAL, CKMB, CKMBINDEX, TROPONINI in the last  168 hours. BNP (last 3 results) No results for input(s): PROBNP in the last 8760 hours. HbA1C: No results for input(s): HGBA1C in the last 72 hours. CBG: Recent Labs  Lab 07/29/19 0608 07/29/19 1128 07/29/19 1607 07/29/19 2201 07/30/19 0623  GLUCAP 103* 91 105* 111* 123*   Lipid Profile: No results for input(s): CHOL, HDL, LDLCALC, TRIG, CHOLHDL, LDLDIRECT in the last 72 hours. Thyroid Function Tests: No results for input(s): TSH, T4TOTAL, FREET4, T3FREE, THYROIDAB in the last 72 hours. Anemia Panel: Recent Labs    07/28/19 1751  VITAMINB12 304  FOLATE 27.0  FERRITIN 518*  TIBC 218*  IRON 49  RETICCTPCT 0.7   Sepsis Labs: Recent Labs  Lab 07/25/19 1546 07/25/19 1735 07/26/19 1233 07/26/19 1657 07/27/19 0417 07/28/19 0505  PROCALCITON  --   --   --  0.23 0.25 0.46  LATICACIDVEN 3.5* 3.3* 1.9  --   --   --     Recent Results (from the past 240 hour(s))  Wound or Superficial Culture     Status: Abnormal   Collection Time: 07/25/19  4:28 PM   Specimen: Wound  Result Value Ref Range Status   Specimen Description WOUND  Final   Special Requests Normal  Final   Gram Stain   Final    RARE WBC PRESENT, PREDOMINANTLY PMN MODERATE GRAM POSITIVE RODS FEW GRAM POSITIVE COCCI FEW GRAM NEGATIVE RODS Performed at [facility], [address] [place], [place] [address]    Culture MULTIPLE ORGANISMS PRESENT, NONE PREDOMINANT (A)  Final   Report Status 07/27/2019 FINAL  Final  SARS CORONAVIRUS 2 (TAT 6-24 HRS) Nasopharyngeal Nasopharyngeal Swab     Status: None   Collection Time: 07/25/19  8:38 PM   Specimen: Nasopharyngeal Swab  Result Value Ref Range Status   SARS Coronavirus 2 NEGATIVE NEGATIVE Final    Comment: (NOTE) SARS-CoV-2 target nucleic acids are NOT DETECTED. The SARS-CoV-2 RNA is generally detectable in upper and lower respiratory specimens during the acute phase of infection. Negative results do not preclude SARS-CoV-2 infection, do not rule out  co-infections with other pathogens, and should not be used as the sole basis for treatment or other patient management decisions. Negative results must be combined with clinical observations, patient history, and epidemiological information. The expected result is Negative. Fact Sheet for Patients: SugarRoll.be Fact Sheet for Healthcare Providers: https://www.woods-mathews.com/ This test is not yet approved or cleared by the [place] FDA and  has been authorized for detection and/or diagnosis of SARS-CoV-2 by FDA under an Emergency Use Authorization (EUA). This EUA will remain  in effect (meaning this test  can be used) for the duration of the COVID-19 declaration under Section 56 4(b)(1) of the Act, 21 U.S.C. section 360bbb-3(b)(1), unless the authorization is terminated or revoked sooner. Performed at [facility], [place] [address], [place], [place] [address]   Surgical pcr screen     Status: None   Collection Time: 07/29/19  4:37 AM   Specimen: Nasal Mucosa; Nasal Swab  Result Value Ref Range Status   MRSA, PCR NEGATIVE NEGATIVE Final   Staphylococcus aureus NEGATIVE NEGATIVE Final    Comment: (NOTE) The Xpert SA Assay (FDA approved for NASAL specimens in patients 65 years of age and older), is one component of a comprehensive surveillance program. It is not intended to diagnose infection nor to guide or monitor treatment. Performed at [facility], [place] [address] [place],  [address]      Radiology Studies: No results found.  Scheduled Meds: . amiodarone  200 mg Oral BID  . aspirin EC  81 mg Oral Daily  . atorvastatin  40 mg Oral QHS  . docusate sodium  100 mg Oral BID  . feeding supplement (ENSURE ENLIVE)  237 mL Oral TID BM  . folic acid  1 mg Oral Daily  . Gerhardt's butt cream  1 application Topical Daily  . heparin injection (subcutaneous)  5,000 Units Subcutaneous Q8H  . hydrALAZINE  25 mg Oral Q8H   . multivitamin with minerals  1 tablet Oral Daily  . sodium chloride flush  3 mL Intravenous Once   Continuous Infusions: . lactated ringers Stopped (07/30/19 0048)  . lactated ringers 10 mL/hr at 07/29/19 1222  . piperacillin-tazobactam (ZOSYN)  IV 3.375 g (07/30/19 1400)  . vancomycin 1,000 mg (07/29/19 2232)     LOS: 5 days   Marylu Lund, MD [facility] Pager On Amion  If 7PM-7AM, please contact night-coverage 07/30/2019, 4:43 PM

## 2019-07-30 NOTE — Progress Notes (Signed)
The patient was received.  Alert and oriented to the [place] unit.  Wound vac dressing in place.  No drainage noted.

## 2019-07-30 NOTE — Progress Notes (Signed)
ANTICOAGULATION CONSULT NOTE - Follow Up Consult  Pharmacy Consult for Apixaban Indication: atrial fibrillation  No Known Allergies  Patient Measurements: Height: 5\' 6"  (167.6 cm) Weight: 150 lb 2.1 oz (68.1 kg) IBW/kg (Calculated) : 63.8   Vital Signs: Temp: 98.3 F (36.8 C) (11/26 1036) Temp Source: Oral (11/26 1036) BP: 98/50 (11/26 1500) Pulse Rate: 62 (11/26 1037)  Labs: Recent Labs    07/29/19 0440 07/30/19 0316  HGB 11.5* 9.6*  HCT 35.4* 29.5*  PLT 308 276  CREATININE 1.25* 1.25*    Estimated Creatinine Clearance: 51.7 mL/min (A) (by C-G formula based on SCr of 1.25 mg/dL (H)).  Assessment: 14 YOM on apixaban PTA for hx of PAF who presented with foot wound 11/21 found to be osteomyelitis. Apixaban held 11/22, transitioned to SQ heparin for left AKA. Patient is now POD#1, pharmacy consulted to restart PTA apixaban.  Goal of Therapy:  Stroke prevention Monitor platelets by anticoagulation protocol: Yes   Plan:  Resume apixaban 5 mg PO BID tonight Monitor CBC, s/sx of bleeding     Thank you for allowing pharmacy to participate in this patient's care.  Daneen Volcy L. Devin Going, [place] PGY1 Pharmacy Resident [phone] 07/30/19     5:13 PM  Please check AMION for all [facility] phone numbers After 10:00 PM, call the [facility] [phone]

## 2019-07-31 ENCOUNTER — Encounter (HOSPITAL_COMMUNITY): Payer: Self-pay | Admitting: Orthopedic Surgery

## 2019-07-31 LAB — SURGICAL PATHOLOGY

## 2019-07-31 NOTE — Evaluation (Signed)
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  X-ray of the left foot show signs suspicious for osteomyelitis. Pt underwent L AKA on 11/25.   Clinical Impression   This 68 yo male with recent R AKA (Sept 2020) and now L AKA presents to acute OT with decreased mobility, decreased balance, generalized weakness, and pain all affecting his safety and ability to A with basic ADLs. He will benefit from acute OT with follow up at SNF to get a point where hopefully he can go home with family.     Follow Up Recommendations  SNF;Supervision/Assistance - 24 hour    Equipment Recommendations  Other (comment)(TBD next venue)       Precautions / Restrictions Precautions Precautions: Fall Restrictions Weight Bearing Restrictions: Yes RLE Weight Bearing: Non weight bearing LLE Weight Bearing: Non weight bearing      Mobility Bed Mobility Overal bed mobility: Needs Assistance Bed Mobility: Rolling;Sidelying to Sit Rolling: (use rails to roll but cannot get completely over enough due to confined space and inability to shift his hips while on his side) to get over enough for complete backperi care. Bed pads used to rotate and shift his hips more) Sidelying to sit: Max assist               Balance Overall balance assessment: Needs assistance Sitting-balance support: Bilateral upper extremity supported(AKAs totally supported on bed) Sitting balance-Leahy Scale: Poor Sitting balance - Comments: Had him try and not use his hands to support self at EOB and he was only able to do this momentarily                                    ADL either performed or assessed with clinical judgement   ADL Overall ADL's : Needs assistance/impaired Eating/Feeding: Set up;Bed level   Grooming: Set up;Supervision/safety;Bed level   Upper Body Bathing: Set up;Supervision/ safety;Bed level   Lower Body Bathing: Total assistance;Bed level   Upper Body Dressing : Moderate assistance;Bed level   Lower Body Dressing: Total assistance;Bed level   Toilet Transfer: Total assistance;+2 for physical assistance                   Vision Patient Visual Report: No change from baseline              Pertinent Vitals/Pain Pain Assessment: Faces Faces Pain Scale: Hurts little more Pain Location: LLE Pain Descriptors / Indicators: Sore Pain Intervention(s): Limited activity within patient's tolerance;Monitored during session;Repositioned     Hand Dominance Right   Extremity/Trunk Assessment Upper Extremity Assessment Upper Extremity Assessment: Generalized weakness           Communication Communication Communication: No difficulties   Cognition Arousal/Alertness: Awake/alert Behavior During Therapy: WFL for tasks assessed/performed Overall Cognitive Status: History of cognitive impairments - at baseline(but not sure if same or worse now than baseline, no family available))                                 General Comments: Pt followed all one step commands  Home Living Family/patient expects to be discharged to:: Skilled nursing facility Living Arrangements: Spouse/significant other Available Help at Discharge: Friend(s);Available PRN/intermittently Type of Home: House Home Access: Ramped entrance     Home Layout: One level         Bathroom Toilet: Standard     Home Equipment: Walker - 2 wheels;Bedside commode          Prior Functioning/Environment Level of Independence: Needs assistance  Gait / Transfers Assistance Needed: pt is poor historian and reports  ambulating (not hopping) with use of RW prior to admission. Will need family to confirm history ADL's / Homemaking Assistance Needed: Pt report she was needing A with B/D since his AKA in Sept 2020            OT Problem List: Decreased strength;Decreased range of motion;Impaired balance (sitting and/or standing);Pain;Decreased cognition;Decreased safety awareness      OT Treatment/Interventions: Self-care/ADL training;Therapeutic exercise;DME and/or AE instruction;Patient/family education;Balance training    OT Goals(Current goals can be found in the care plan section) Acute Rehab OT Goals Patient Stated Goal: agreed to work with therapy OT Goal Formulation: With patient Time For Goal Achievement: 08/14/19 Potential to Achieve Goals: Good  OT Frequency: Min 2X/week              AM-PAC OT "6 Clicks" Daily Activity     Outcome Measure Help from another person eating meals?: None Help from another person taking care of personal grooming?: A Little Help from another person toileting, which includes using toliet, bedpan, or urinal?: Total Help from another person bathing (including washing, rinsing, drying)?: A Lot Help from another person to put on and taking off regular upper body clothing?: A Lot Help from another person to put on and taking off regular lower body clothing?: Total 6 Click Score: 13   End of Session Nurse Communication: (pt with diarrhea, but is now clean)  Activity Tolerance: Patient tolerated treatment well Patient left: in bed;with call bell/phone within reach;with bed alarm set  OT Visit Diagnosis: Other abnormalities of gait and mobility (R26.89);Muscle weakness (generalized) (M62.81);Pain Pain - Right/Left: Left Pain - part of body: Leg                Time: [id number] OT Time Calculation (min): 27 min Charges:  OT General Charges $OT Visit: 1 Visit OT Evaluation $OT Eval Moderate Complexity: 1 Mod OT Treatments $Self Care/Home Management : 8-22  mins  [address], OTR/L Acute [employer] Pager [phone] Office [phone]     Almon Register 07/31/2019, 12:30 PM

## 2019-07-31 NOTE — Progress Notes (Addendum)
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 X-ray of the left foot show signs suspicious for osteomyelitis it was followed with an MRI of the left foot. It shows: Soft tissue ulceration at the lateral aspect of the foot at the level of the fifth metatarsal base with underlying acute osteomyelitis of the lateral aspect of the fifth metatarsal base. Area of non-enhancement of the soft tissues surrounding the ulceration concerning for tissue necrosis. Thickening and irregularity of the peroneus brevis tendon at its insertion on the fifth metatarsal base, likely reactive. Marked soft tissue swelling of the dorsum of the forefoot. Orthopedics consulted and Eliquis held for amputation on Wednesday. Discussed with [name] at bedside and updated her.No new complaints overnight. He reports pain is well controlled.  Consultants  . Orthopedic surgery . Wound care  Procedures  . Left above the knee amputation.  Antibiotics   Anti-infectives (From admission, onward)   Start     Dose/Rate Route Frequency Ordered Stop   07/29/19 2200  vancomycin (VANCOCIN) IVPB 1000 mg/200 mL premix  Status:  Discontinued     1,000 mg 200 mL/hr over 60 Minutes Intravenous Every 24 hours 07/29/19 1525 07/30/19 1654   07/29/19 1600  ceFAZolin (ANCEF) IVPB 1 g/50 mL premix  Status:  Discontinued     1 g 100 mL/hr over 30 Minutes Intravenous Every 6 hours 07/29/19 1556 07/29/19 1600   07/29/19 0830  ceFAZolin (ANCEF) IVPB 2g/100 mL premix     2 g 200 mL/hr over 30 Minutes Intravenous To ShortStay Surgical 07/28/19 2010 07/29/19 1248   07/26/19 2200  vancomycin (VANCOCIN) 1,250  mg in sodium chloride 0.9 % 250 mL IVPB  Status:  Discontinued     1,250 mg 166.7 mL/hr over 90 Minutes Intravenous Every 24 hours 07/25/19 2012 07/29/19 1526   07/25/19 2100  piperacillin-tazobactam (ZOSYN) IVPB 3.375 g  Status:  Discontinued     3.375 g 12.5 mL/hr over 240 Minutes Intravenous Every 8 hours 07/25/19 2012 07/30/19 1654   07/25/19 2015  vancomycin (VANCOCIN) 1,500 mg in sodium chloride 0.9 % 500 mL IVPB     1,500 mg 250 mL/hr over 120 Minutes Intravenous  Once 07/25/19 2012 07/26/19 0134    .  Subjective  The patient is resting comfortably. No new complaints.  Objective   Vitals:  Vitals:   07/31/19 1445 07/31/19 1801  BP: 125/60 (!) 130/58  Pulse: 73 73  Resp: 16   Temp: 98.4 F (36.9 C) 98.3 F (36.8 C)  SpO2: 96% 97%   Exam:  Constitutional:  . The patient is awake, alert, and oriented x 3. No acute distress. Respiratory:  . No increased work of breathing. . No wheezes, rales, or rhonchi . No tactile fremitus Cardiovascular:  . Regular rate and rhythm . No murmurs, ectopy, or gallups. . No lateral PMI. No thrills. Abdomen:  . Abdomen is soft, non-tender, non-distended . No hernias, masses, or organomegaly . Normoactive bowel sounds.  Musculoskeletal:  . Left Stump is bandaged.  Skin:  . No rashes, lesions, ulcers . palpation of skin: no induration or nodules Neurologic:  . CN 2-12 intact . Sensation all 4 extremities intact Psychiatric:  . Mental status o Mood, affect appropriate o Orientation to  person, place, time  . judgment and insight appear intact  I have personally reviewed the following:   Today's Data  . Vitals  Scheduled Meds: . amiodarone  200 mg Oral BID  . apixaban  5 mg Oral BID  . aspirin EC  81 mg Oral Daily  . atorvastatin  40 mg Oral QHS  . docusate sodium  100 mg Oral BID  . feeding supplement (ENSURE ENLIVE)  237 mL Oral TID BM  . folic acid  1 mg Oral Daily  . Gerhardt's butt cream  1 application Topical  Daily  . hydrALAZINE  25 mg Oral Q8H  . multivitamin with minerals  1 tablet Oral Daily  . sodium chloride flush  3 mL Intravenous Once   Continuous Infusions: . lactated ringers 10 mL/hr at 07/30/19 2030  . lactated ringers 10 mL/hr at 07/29/19 1222    Active Problems:   Dementia ([place])   Hyperlipidemia   Hypertension   Paroxysmal atrial fibrillation (HCC)   Foot ulcer ([place])   Osteomyelitis (HCC)   Protein-calorie malnutrition, severe (HCC)   PVD (peripheral vascular disease) (HCC)   Malnutrition of moderate degree   Knee contracture, left   LOS: 6 days   A & P   Sepsis: Resolved. Secondary to osteomyelitis of left fifth metatarsal bone. Sepsis based upon lactic acid, hypotension, and tachycardia. Patient s/p AKA of left lower extremity. Sepsis is resolved.   Osteomyelitis of left fifth metatarsal bone: S/P AKA on 07/29/2019. He wascontinued on empiric vancomycin and zosyn, but these now have been discontinued. He is awaiting SNF placement.   History of CVA: Continue ASA and lipitor as PTA.  Essential hypertension: Blood pressures are under fair control on hydralazine 25 mg tid.  Moderate protein calorie malnutrition: Patient is currently on feeding supplements.  Microcytic anemia: Iron level of 49. Will supplement. Hgb down to 9.2 from 11.5 yesterday. Patient remains hemodynamically stable. Will monitor and plan to transfuse if hemoglobin less than 7.0.  History of dementia: Pt Is calm and able to answering simple questions and following commands.Stable at this time.  Paroxysmal atrial fibrillation: Heart rate is controlled on amiodarone. Eliquis for CVA protection held for surgery, but now restarted.Echocardiogram done shows Left ventricular ejection fraction, by visual estimation, is 60 to 65%. The left ventricle has normal function. There is mildly increased left ventricular hypertrophy. Global right ventricle has normal systolic function.The right ventricular  size is normal. No increase in right ventricular wall thickness.  Positive HCV antibody: HCV [id number].Elevated AST at 70, normal alk phos and normal ALT. Bilirubin at 0.2 liver is grossly normal. Recommend outpatient follow up with ID/ GI for HCV treatment.   AAA: Ultrasound incidental finding of Abdominal aortic diameter of 2.8 cm. Ectatic abdominal aorta at risk for aneurysm development. Recommend followup by ultrasound in 5 years. Stable.  Peripheral vascular disease: Pt had undergone aortogram with lower extremity in 03/2019 by Dr Trula Slade Showing Bilateral superficial femoral artery occlusions without reconstitution of a named distal vessel. Pt noted to be not a candidate for revascularization either open or percutaneous. Currently stable.  I have seen and examined this patient myself. I have spent 32 minutes in her evaluation and care.  DVT prophylaxis: Heparin subq Code Status: Full Family Communication: Pt in room, family not at bedside Disposition Plan: Uncertain at this time  Orren Pietsch, DO [facility] Direct contact: see [email or web address]  7PM-7AM contact night coverage as above 07/31/2019, 6:18 PM  LOS: 6 days

## 2019-07-31 NOTE — Care Management (Signed)
CM consult acknowledged to assist with any HH/DME needs. Awaiting PT/OT eval for DCP recommendations and will continue to follow.  Chyrl Elwell MSN, RN, NCM-BC, ACM-RN [phone] 

## 2019-07-31 NOTE — Plan of Care (Signed)

## 2019-08-01 LAB — BASIC METABOLIC PANEL
Anion gap: 10 (ref 5–15)
BUN: 21 mg/dL (ref 8–23)
CO2: 22 mmol/L (ref 22–32)
Calcium: 8.5 mg/dL — ABNORMAL LOW (ref 8.9–10.3)
Chloride: 110 mmol/L (ref 98–111)
Creatinine, Ser: 1.14 mg/dL (ref 0.61–1.24)
GFR calc Af Amer: >60 mL/min (ref >60)
GFR calc non Af Amer: >60 mL/min (ref >60)
Glucose, Bld: 121 mg/dL — ABNORMAL HIGH (ref 70–99)
Potassium: 4.9 mmol/L (ref 3.5–5.1)
Sodium: 142 mmol/L (ref 135–145)

## 2019-08-01 LAB — CBC
HCT: 28.3 % — ABNORMAL LOW (ref 39.0–52.0)
Hemoglobin: 9.2 g/dL — ABNORMAL LOW (ref 13.0–17.0)
MCH: 25.1 pg — ABNORMAL LOW (ref 26.0–34.0)
MCHC: 32.5 g/dL (ref 30.0–36.0)
MCV: 77.1 fL — ABNORMAL LOW (ref 80.0–100.0)
Platelets: 305 10*3/uL (ref 150–400)
RBC: 3.67 MIL/uL — ABNORMAL LOW (ref 4.22–5.81)
RDW: 16.6 % — ABNORMAL HIGH (ref 11.5–15.5)
WBC: 10.6 10*3/uL — ABNORMAL HIGH (ref 4.0–10.5)
nRBC: 0 % (ref 0.0–0.2)

## 2019-08-01 NOTE — TOC Initial Note (Signed)
Transition of Care [facility]) - Initial/Assessment Note    Patient Details  Name: [name] MRN: [id number] Date of Birth: 07/13/1951  [facility]) CM/SW Contact:    Alexander Mt, [place] Phone Number: 08/01/2019, 1:12 PM  Clinical Narrative:                 CSW spoke with pt niece [name] at [phone]. Introduced self, role, reason for call. Pt was recently in hospital in 05/2019. Confirmed home address, PCP, and care at home. Pt niece still declining SNF at this time- "the virus is still out there." She prefers pt return home with care from [place]. She has submitted application for CAPs but knows that there is a long wait list for that. When she is at work she has organized assistance with pt.   She has all needed DME from previous admission. Will f/u with RNCM to get in touch with [facility] representative for referral.   Expected Discharge Plan: [place] Barriers to Discharge: Continued Medical Work up   Patient Goals and CMS Choice Patient states their goals for this hospitalization and ongoing recovery are:: for him to come home CMS Medicare.gov Compare Post Acute Care list provided to:: Patient Represenative (must comment)(pt niece) Choice offered to / list presented to : (pt niece)  Expected Discharge Plan and Services Expected Discharge Plan: [place] In-house Referral: Clinical Social Work Discharge Planning Services: CM Consult Post Acute Care Choice: Resumption of Svcs/PTA Provider, Home Health(e) Living arrangements for the past 2 months: Single Family Home  Prior Living Arrangements/Services Living arrangements for the past 2 months: Single Family Home Lives with:: Relatives Patient language and need for interpreter reviewed:: Yes(no needs) Do you feel safe going back to the place where you live?: Yes      Need for Family Participation in Patient Care: Yes (Comment)(assistance with ADL/IADLs) Care giver support system  in place?: Yes (comment)(pt niece; Alvis Lemmings) Current home services: DME, Homehealth aide Criminal Activity/Legal Involvement Pertinent to Current Situation/Hospitalization: No - Comment as needed  Activities of Daily Living Home Assistive Devices/Equipment: Wheelchair ADL Screening (condition at time of admission) Patient's cognitive ability adequate to safely complete daily activities?: Yes Is the patient deaf or have difficulty hearing?: No Does the patient have difficulty seeing, even when wearing glasses/contacts?: No Does the patient have difficulty concentrating, remembering, or making decisions?: Yes Patient able to express need for assistance with ADLs?: Yes Does the patient have difficulty dressing or bathing?: No Independently performs ADLs?: Yes (appropriate for developmental age) Does the patient have difficulty walking or climbing stairs?: Yes Weakness of Legs: Left Weakness of Arms/Hands: None  Permission Sought/Granted Permission sought to share information with : Family Supports, Other (comment) Permission granted to share information with : No(pt with fluctuating orientation)  Share Information with NAME: Sa Pisarski  Permission granted to share info w AGENCY: [place] granted to share info w Relationship: niece  Permission granted to share info w Contact Information: [phone]  Emotional Assessment Appearance:: Other (Comment Required(telephonic assessment with pt niece) Attitude/Demeanor/Rapport: (telephonic assessment with pt niece) Affect (typically observed): (telephonic assessment with pt niece) Orientation: : Oriented to Self, Oriented to Place Alcohol / Substance Use: Not Applicable Psych Involvement: No (comment)  Admission diagnosis:  Osteomyelitis of left foot, unspecified type [facility]) [M86.9] Patient Active Problem List   Diagnosis Date Noted  . Knee contracture, left   . Malnutrition of moderate degree 07/27/2019  . PVD (peripheral  vascular disease) ([place])   . Osteomyelitis ([place]) 07/25/2019  . Protein-calorie malnutrition, severe ([place]) 07/25/2019  . Foot ulcer ([place]) 05/08/2019  . Acute osteomyelitis of metatarsal bone of right foot ([place]) 04/27/2019  . Critical lower limb ischemia 04/25/2019  . Pressure injury of skin 03/20/2019  . Osteomyelitis of right foot ([place]) 03/15/2019  . Wound of right foot 03/14/2019  . Coronary artery disease   . Hyperlipidemia   . Hypertension   . Paroxysmal atrial fibrillation (HCC)   . CKD (chronic kidney disease), stage IV ([place])   . Dementia ([place]) 02/10/2019   PCP:  [name], MD Pharmacy:   [id number] - [place], [address] [address] Phone: [phone] Fax: [phone]     Social Determinants of Health (SDOH) Interventions    Readmission Risk Interventions Readmission Risk Prevention Plan 08/01/2019 05/14/2019 03/23/2019  Post Dischage Appt - Complete -  Medication Screening - Complete -  Transportation Screening Complete - Complete  PCP or Specialist Appt within 5-7 Days Not Complete - Complete  Not Complete comments office closed on weekend - -  Home Care Screening Complete - Complete  Medication Review (RN CM) Complete - Complete

## 2019-08-01 NOTE — Progress Notes (Signed)
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 X-ray of the left foot show signs suspicious for osteomyelitis it was followed with an MRI of the left foot. It shows: Soft tissue ulceration at the lateral aspect of the foot at the level of the fifth metatarsal base with underlying acute osteomyelitis of the lateral aspect of the fifth metatarsal base. Area of non-enhancement of the soft tissues surrounding the ulceration concerning for tissue necrosis. Thickening and irregularity of the peroneus brevis tendon at its insertion on the fifth metatarsal base, likely reactive. Marked soft tissue swelling of the dorsum of the forefoot. Orthopedics consulted and Eliquis held for amputation on Wednesday. Discussed with [name] at bedside and updated her.No new complaints overnight. He reports pain is well controlled.  He is awaiting SNF placement.  Consultants  . Orthopedic surgery . Wound care  Procedures  . Left above the knee amputation.  Antibiotics   Anti-infectives (From admission, onward)   Start     Dose/Rate Route Frequency Ordered Stop   07/29/19 2200  vancomycin (VANCOCIN) IVPB 1000 mg/200 mL premix  Status:  Discontinued     1,000 mg 200 mL/hr over 60 Minutes Intravenous Every 24 hours 07/29/19 1525 07/30/19 1654   07/29/19 1600  ceFAZolin (ANCEF) IVPB 1 g/50 mL premix  Status:  Discontinued     1 g 100 mL/hr over 30 Minutes Intravenous Every 6 hours 07/29/19 1556 07/29/19 1600   07/29/19 0830  ceFAZolin (ANCEF) IVPB 2g/100 mL premix     2 g 200 mL/hr over 30 Minutes Intravenous To ShortStay Surgical 07/28/19 2010 07/29/19 1248   07/26/19  2200  vancomycin (VANCOCIN) 1,250 mg in sodium chloride 0.9 % 250 mL IVPB  Status:  Discontinued     1,250 mg 166.7 mL/hr over 90 Minutes Intravenous Every 24 hours 07/25/19 2012 07/29/19 1526   07/25/19 2100  piperacillin-tazobactam (ZOSYN) IVPB 3.375 g  Status:  Discontinued     3.375 g 12.5 mL/hr over 240 Minutes Intravenous Every 8 hours 07/25/19 2012 07/30/19 1654   07/25/19 2015  vancomycin (VANCOCIN) 1,500 mg in sodium chloride 0.9 % 500 mL IVPB     1,500 mg 250 mL/hr over 120 Minutes Intravenous  Once 07/25/19 2012 07/26/19 0134     Subjective  The patient is resting comfortably. No new complaints.  Objective   Vitals:  Vitals:   08/01/19 0851 08/01/19 1547  BP: 125/64 (!) 141/67  Pulse: 73 71  Resp:    Temp: 99.2 F (37.3 C) 98.8 F (37.1 C)  SpO2: 98% 98%   Exam:  Constitutional:  . The patient is awake, alert, and oriented x 3. No acute distress. Respiratory:  . No increased work of breathing. . No wheezes, rales, or rhonchi . No tactile fremitus Cardiovascular:  . Regular rate and rhythm . No murmurs, ectopy, or gallups. . No lateral PMI. No thrills. Abdomen:  . Abdomen is soft, non-tender, non-distended . No hernias, masses, or organomegaly . Normoactive bowel sounds.  Musculoskeletal:  . Left Stump is bandaged.  Skin:  . No rashes, lesions, ulcers . palpation of skin: no induration or nodules Neurologic:  . CN 2-12 intact . Sensation all 4 extremities intact Psychiatric:  . Mental status o Mood,  affect appropriate o Orientation to person, place, time  . judgment and insight appear intact  I have personally reviewed the following:   Today's Data  . Vitals  Scheduled Meds: . amiodarone  200 mg Oral BID  . apixaban  5 mg Oral BID  . aspirin EC  81 mg Oral Daily  . atorvastatin  40 mg Oral QHS  . docusate sodium  100 mg Oral BID  . feeding supplement (ENSURE ENLIVE)  237 mL Oral TID BM  . folic acid  1 mg Oral Daily  . Gerhardt's butt  cream  1 application Topical Daily  . hydrALAZINE  25 mg Oral Q8H  . multivitamin with minerals  1 tablet Oral Daily  . sodium chloride flush  3 mL Intravenous Once   Continuous Infusions: . lactated ringers 10 mL/hr at 07/30/19 2030  . lactated ringers 10 mL/hr at 07/29/19 1222    Active Problems:   Dementia ([place])   Hyperlipidemia   Hypertension   Paroxysmal atrial fibrillation (HCC)   Foot ulcer ([place])   Osteomyelitis (HCC)   Protein-calorie malnutrition, severe (HCC)   PVD (peripheral vascular disease) (HCC)   Malnutrition of moderate degree   Knee contracture, left   LOS: 7 days   A & P   Sepsis: Resolved. Secondary to osteomyelitis of left fifth metatarsal bone. Sepsis based upon lactic acid, hypotension, and tachycardia. Patient s/p AKA of left lower extremity. Sepsis is resolved.   Osteomyelitis of left fifth metatarsal bone: S/P AKA on 07/29/2019. He wascontinued on empiric vancomycin and zosyn, but these now have been discontinued. He is awaiting SNF placement.   History of CVA: Continue ASA and lipitor as PTA.  Essential hypertension: Blood pressures are under fair control on hydralazine 25 mg tid.  Moderate protein calorie malnutrition: Patient is currently on feeding supplements.  Microcytic anemia: Iron level of 49. Will supplement. Hgb down to 9.2 from 11.5 yesterday. Patient remains hemodynamically stable. Will monitor and plan to transfuse if hemoglobin less than 7.0.  History of dementia: Pt Is calm and able to answering simple questions and following commands.Stable at this time.  Paroxysmal atrial fibrillation: Heart rate is controlled on amiodarone. Eliquis for CVA protection held for surgery, but now restarted.Echocardiogram done shows Left ventricular ejection fraction, by visual estimation, is 60 to 65%. The left ventricle has normal function. There is mildly increased left ventricular hypertrophy. Global right ventricle has normal systolic  function.The right ventricular size is normal. No increase in right ventricular wall thickness.  Positive HCV antibody: HCV [id number].Elevated AST at 70, normal alk phos and normal ALT. Bilirubin at 0.2 liver is grossly normal. Recommend outpatient follow up with ID/ GI for HCV treatment.   AAA: Ultrasound incidental finding of Abdominal aortic diameter of 2.8 cm. Ectatic abdominal aorta at risk for aneurysm development. Recommend followup by ultrasound in 5 years. Stable.  Peripheral vascular disease: Pt had undergone aortogram with lower extremity in 03/2019 by Dr Trula Slade Showing Bilateral superficial femoral artery occlusions without reconstitution of a named distal vessel. Pt noted to be not a candidate for revascularization either open or percutaneous. Currently stable.  I have seen and examined this patient myself. I have spent 30 minutes in her evaluation and care.  DVT prophylaxis: Heparin subq Code Status: Full Family Communication: Pt in room, family not at bedside Disposition Plan: Uncertain at this time  Deriana Vanderhoef, DO [facility] Direct contact: see [email or web address]  7PM-7AM contact night coverage as above 08/01/2019, 4:46 PM  LOS: 6  days

## 2019-08-01 NOTE — TOC Progression Note (Signed)
Transition of Care [facility]) - Progression Note    Patient Details  Name: [name] MRN: [id number] Date of Birth: 14-Jan-1951  Transition of Care [facility]) CM/SW [place], [place] Phone Number: 08/01/2019, 1:23 PM  Clinical Narrative:    Referral given to Adela Lank with [facility] for therapy services to return to home. Continue to follow.    Expected Discharge Plan: [place] Barriers to Discharge: Continued Medical Work up  Expected Discharge Plan and Services Expected Discharge Plan: [place] In-house Referral: Clinical Social Work Discharge Planning Services: CM Consult Post Acute Care Choice: Resumption of Svcs/PTA Provider, Home Health(e) Living arrangements for the past 2 months: [place]: [place] Date [place]: 08/01/19 Time [place]: [place] Representative spoke with at [place]: [place] ([place]) Interventions    Readmission Risk Interventions Readmission Risk Prevention Plan 08/01/2019 05/14/2019 03/23/2019  Post Dischage Appt - Complete -  Medication Screening - Complete -  Transportation Screening Complete - Complete  PCP or Specialist Appt within 5-7 Days Not Complete - Complete  Not Complete comments office closed on weekend - -  Home Care Screening Complete - Complete  Medication Review (RN CM) Complete - Complete

## 2019-08-01 NOTE — Plan of Care (Signed)
  Problem: Education: Goal: Knowledge of General Education information will improve Description Including pain rating scale, medication(s)/side effects and non-pharmacologic comfort measures Outcome: Progressing   

## 2019-08-02 LAB — CBC WITH DIFFERENTIAL/PLATELET
Abs Immature Granulocytes: 0.21 10*3/uL — ABNORMAL HIGH (ref 0.00–0.07)
Basophils Absolute: 0 10*3/uL (ref 0.0–0.1)
Basophils Relative: 0 %
Eosinophils Absolute: 0.1 10*3/uL (ref 0.0–0.5)
Eosinophils Relative: 1 %
HCT: 29.5 % — ABNORMAL LOW (ref 39.0–52.0)
Hemoglobin: 9.8 g/dL — ABNORMAL LOW (ref 13.0–17.0)
Immature Granulocytes: 2 %
Lymphocytes Relative: 20 %
Lymphs Abs: 2.2 10*3/uL (ref 0.7–4.0)
MCH: 25.5 pg — ABNORMAL LOW (ref 26.0–34.0)
MCHC: 33.2 g/dL (ref 30.0–36.0)
MCV: 76.8 fL — ABNORMAL LOW (ref 80.0–100.0)
Monocytes Absolute: 0.9 10*3/uL (ref 0.1–1.0)
Monocytes Relative: 7 %
Neutro Abs: 8 10*3/uL — ABNORMAL HIGH (ref 1.7–7.7)
Neutrophils Relative %: 70 %
Platelets: 319 10*3/uL (ref 150–400)
RBC: 3.84 MIL/uL — ABNORMAL LOW (ref 4.22–5.81)
RDW: 16.6 % — ABNORMAL HIGH (ref 11.5–15.5)
WBC: 11.4 10*3/uL — ABNORMAL HIGH (ref 4.0–10.5)
nRBC: 0 % (ref 0.0–0.2)

## 2019-08-02 LAB — BASIC METABOLIC PANEL
Anion gap: 5 (ref 5–15)
BUN: 20 mg/dL (ref 8–23)
CO2: 21 mmol/L — ABNORMAL LOW (ref 22–32)
Calcium: 8.5 mg/dL — ABNORMAL LOW (ref 8.9–10.3)
Chloride: 109 mmol/L (ref 98–111)
Creatinine, Ser: 1.07 mg/dL (ref 0.61–1.24)
GFR calc Af Amer: >60 mL/min (ref >60)
GFR calc non Af Amer: >60 mL/min (ref >60)
Glucose, Bld: 108 mg/dL — ABNORMAL HIGH (ref 70–99)
Potassium: 5 mmol/L (ref 3.5–5.1)
Sodium: 135 mmol/L (ref 135–145)

## 2019-08-02 MED ORDER — SODIUM CHLORIDE 0.9 % IV BOLUS
500.0000 mL | Freq: Once | INTRAVENOUS | Status: AC
  Administered 2019-08-02: 500 mL via INTRAVENOUS

## 2019-08-02 NOTE — Plan of Care (Signed)
  Problem: Education: Goal: Knowledge of General Education information will improve Description Including pain rating scale, medication(s)/side effects and non-pharmacologic comfort measures Outcome: Progressing   

## 2019-08-02 NOTE — Progress Notes (Signed)
Pt's blood pressure 93/50, provider notified. Dr. Benny Lennert ordered a 500cc bolus. Pt asymptomatic, will continue to monitor.

## 2019-08-02 NOTE — Progress Notes (Signed)
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 X-ray of the left foot show signs suspicious for osteomyelitis it was followed with an MRI of the left foot. It shows: Soft tissue ulceration at the lateral aspect of the foot at the level of the fifth metatarsal base with underlying acute osteomyelitis of the lateral aspect of the fifth metatarsal base. Area of non-enhancement of the soft tissues surrounding the ulceration concerning for tissue necrosis. Thickening and irregularity of the peroneus brevis tendon at its insertion on the fifth metatarsal base, likely reactive. Marked soft tissue swelling of the dorsum of the forefoot. Orthopedics consulted and Eliquis held for amputation on Wednesday. Discussed with [name] at bedside and updated her.No new complaints overnight. He reports pain is well controlled.  He is awaiting SNF placement.  Consultants  . Orthopedic surgery . Wound care  Procedures  . Left above the knee amputation.  Antibiotics   Anti-infectives (From admission, onward)   Start     Dose/Rate Route Frequency Ordered Stop   07/29/19 2200  vancomycin (VANCOCIN) IVPB 1000 mg/200 mL premix  Status:  Discontinued     1,000 mg 200 mL/hr over 60 Minutes Intravenous Every 24 hours 07/29/19 1525 07/30/19 1654   07/29/19 1600  ceFAZolin (ANCEF) IVPB 1 g/50 mL premix  Status:  Discontinued     1 g 100 mL/hr over 30 Minutes Intravenous Every 6 hours 07/29/19 1556 07/29/19 1600   07/29/19 0830  ceFAZolin (ANCEF) IVPB 2g/100 mL premix     2 g 200 mL/hr over 30 Minutes Intravenous To [facility] 07/28/19 2010 07/29/19 1248   07/26/19  2200  vancomycin (VANCOCIN) 1,250 mg in sodium chloride 0.9 % 250 mL IVPB  Status:  Discontinued     1,250 mg 166.7 mL/hr over 90 Minutes Intravenous Every 24 hours 07/25/19 2012 07/29/19 1526   07/25/19 2100  piperacillin-tazobactam (ZOSYN) IVPB 3.375 g  Status:  Discontinued     3.375 g 12.5 mL/hr over 240 Minutes Intravenous Every 8 hours 07/25/19 2012 07/30/19 1654   07/25/19 2015  vancomycin (VANCOCIN) 1,500 mg in sodium chloride 0.9 % 500 mL IVPB     1,500 mg 250 mL/hr over 120 Minutes Intravenous  Once 07/25/19 2012 07/26/19 0134     Subjective  The patient is resting comfortably. No new complaints. Blood pressures have been low today.  Objective   Vitals:  Vitals:   08/02/19 1515 08/02/19 1606  BP: (!) 99/55 (!) 93/50  Pulse: 76 74  Resp:    Temp: 99 F (37.2 C) 99.6 F (37.6 C)  SpO2:  97%   Exam:  Constitutional:  . The patient is awake, alert, and oriented x 3. No acute distress. Respiratory:  . No increased work of breathing. . No wheezes, rales, or rhonchi . No tactile fremitus Cardiovascular:  . Regular rate and rhythm . No murmurs, ectopy, or gallups. . No lateral PMI. No thrills. Abdomen:  . Abdomen is soft, non-tender, non-distended . No hernias, masses, or organomegaly . Normoactive bowel sounds.  Musculoskeletal:  . Left Stump is bandaged.  Skin:  . No rashes, lesions, ulcers . palpation of skin: no induration or nodules Neurologic:  . CN 2-12 intact . Sensation all 4 extremities intact  Psychiatric:  . Mental status o Mood, affect appropriate o Orientation to person, place, time  . judgment and insight appear intact  I have personally reviewed the following:   Today's Data  . Vitals, CBC, BMP  Scheduled Meds: . amiodarone  200 mg Oral BID  . apixaban  5 mg Oral BID  . aspirin EC  81 mg Oral Daily  . atorvastatin  40 mg Oral QHS  . docusate sodium  100 mg Oral BID  . feeding supplement (ENSURE ENLIVE)  237 mL Oral TID BM  . folic  acid  1 mg Oral Daily  . Gerhardt's butt cream  1 application Topical Daily  . hydrALAZINE  25 mg Oral Q8H  . multivitamin with minerals  1 tablet Oral Daily  . sodium chloride flush  3 mL Intravenous Once   Continuous Infusions: . lactated ringers 10 mL/hr at 07/30/19 2030  . lactated ringers 10 mL/hr at 07/29/19 1222  . sodium chloride      Active Problems:   Dementia (HCC)   Hyperlipidemia   Hypertension   Paroxysmal atrial fibrillation (HCC)   Foot ulcer (HCC)   Osteomyelitis (HCC)   Protein-calorie malnutrition, severe (HCC)   PVD (peripheral vascular disease) (HCC)   Malnutrition of moderate degree   Knee contracture, left   LOS: 8 days   A & P   Sepsis: Resolved. Secondary to osteomyelitis of left fifth metatarsal bone. Sepsis based upon lactic acid, hypotension, and tachycardia. Patient s/p AKA of left lower extremity. Sepsis is resolved.   Osteomyelitis of left fifth metatarsal bone: S/P AKA on 07/29/2019. He wascontinued on empiric vancomycin and zosyn, but these now have been discontinued. He is awaiting SNF placement.   History of CVA: Continue ASA and lipitor as PTA.  Essential hypertension: Blood pressures have beebn low over the past 24 hours. Hydralazine held and bolus given. Asymptomatic.  Moderate protein calorie malnutrition: Patient is currently on feeding supplements.  Microcytic anemia: Iron level of 49. Will supplement. Hgb down to 9.2 from 11.5 yesterday. Patient remains hemodynamically stable. Will monitor and plan to transfuse if hemoglobin less than 7.0.  History of dementia: Pt Is calm and able to answering simple questions and following commands.Stable at this time.  Paroxysmal atrial fibrillation: Heart rate is controlled on amiodarone. Eliquis for CVA protection held for surgery, but now restarted.Echocardiogram done shows Left ventricular ejection fraction, by visual estimation, is 60 to 65%. The left ventricle has normal  function. There is mildly increased left ventricular hypertrophy. Global right ventricle has normal systolic function.The right ventricular size is normal. No increase in right ventricular wall thickness.  Positive HCV antibody: HCV [id number].Elevated AST at 70, normal alk phos and normal ALT. Bilirubin at 0.2 liver is grossly normal. Recommend outpatient follow up with ID/ GI for HCV treatment.   AAA: Ultrasound incidental finding of Abdominal aortic diameter of 2.8 cm. Ectatic abdominal aorta at risk for aneurysm development. Recommend followup by ultrasound in 5 years. Stable.  Peripheral vascular disease: Pt had undergone aortogram with lower extremity in 03/2019 by Dr Trula Slade Showing Bilateral superficial femoral artery occlusions without reconstitution of a named distal vessel. Pt noted to be not a candidate for revascularization either open or percutaneous. Currently stable.  I have seen and examined this patient myself. I have spent 34 minutes in her evaluation and care.  DVT prophylaxis: Heparin subq Code Status: Full Family Communication: Pt in room, family not at bedside Disposition Plan: Uncertain at this time  Arthur Carbajal,  DO [facility] Direct contact: see [email or web address]  7PM-7AM contact night coverage as above 08/02/2019, 5:37 PM  LOS: 6 days

## 2019-08-02 NOTE — Plan of Care (Signed)
  Problem: Education: Goal: Knowledge of General Education information will improve Description: Including pain rating scale, medication(s)/side effects and non-pharmacologic comfort measures Outcome: Progressing   Problem: Health Behavior/Discharge Planning: Goal: Ability to manage health-related needs will improve Outcome: Progressing   Problem: Clinical Measurements: Goal: Ability to maintain clinical measurements within normal limits will improve Outcome: Progressing   Problem: Activity: Goal: Risk for activity intolerance will decrease Outcome: Progressing   Problem: Nutrition: Goal: Adequate nutrition will be maintained Outcome: Progressing   Problem: Coping: Goal: Level of anxiety will decrease Outcome: Progressing   Problem: Elimination: Goal: Will not experience complications related to bowel motility Outcome: Progressing   Problem: Safety: Goal: Ability to remain free from injury will improve Outcome: Progressing   

## 2019-08-02 NOTE — Progress Notes (Signed)
Held hydralazine, pt's blood pressure 99/55. Dr. Benny Lennert notified. Pt asymptomatic. Will continue to monitor.

## 2019-08-03 DIAGNOSIS — S78112A Complete traumatic amputation at level between left hip and knee, initial encounter: Secondary | ICD-10-CM

## 2019-08-03 LAB — SARS CORONAVIRUS 2 (TAT 6-24 HRS): SARS Coronavirus 2: NEGATIVE

## 2019-08-03 LAB — BASIC METABOLIC PANEL
Anion gap: 11 (ref 5–15)
BUN: 24 mg/dL — ABNORMAL HIGH (ref 8–23)
CO2: 21 mmol/L — ABNORMAL LOW (ref 22–32)
Calcium: 8 mg/dL — ABNORMAL LOW (ref 8.9–10.3)
Chloride: 106 mmol/L (ref 98–111)
Creatinine, Ser: 1.24 mg/dL (ref 0.61–1.24)
GFR calc Af Amer: >60 mL/min (ref >60)
GFR calc non Af Amer: 60 mL/min — ABNORMAL LOW (ref >60)
Glucose, Bld: 91 mg/dL (ref 70–99)
Potassium: 4.6 mmol/L (ref 3.5–5.1)
Sodium: 138 mmol/L (ref 135–145)

## 2019-08-03 LAB — CBC WITH DIFFERENTIAL/PLATELET
Abs Immature Granulocytes: 0.2 10*3/uL — ABNORMAL HIGH (ref 0.00–0.07)
Basophils Absolute: 0 10*3/uL (ref 0.0–0.1)
Basophils Relative: 0 %
Eosinophils Absolute: 0.2 10*3/uL (ref 0.0–0.5)
Eosinophils Relative: 2 %
HCT: 27.5 % — ABNORMAL LOW (ref 39.0–52.0)
Hemoglobin: 9.3 g/dL — ABNORMAL LOW (ref 13.0–17.0)
Immature Granulocytes: 2 %
Lymphocytes Relative: 28 %
Lymphs Abs: 2.7 10*3/uL (ref 0.7–4.0)
MCH: 25.3 pg — ABNORMAL LOW (ref 26.0–34.0)
MCHC: 33.8 g/dL (ref 30.0–36.0)
MCV: 74.7 fL — ABNORMAL LOW (ref 80.0–100.0)
Monocytes Absolute: 1.1 10*3/uL — ABNORMAL HIGH (ref 0.1–1.0)
Monocytes Relative: 11 %
Neutro Abs: 5.6 10*3/uL (ref 1.7–7.7)
Neutrophils Relative %: 57 %
Platelets: 343 10*3/uL (ref 150–400)
RBC: 3.68 MIL/uL — ABNORMAL LOW (ref 4.22–5.81)
RDW: 16.6 % — ABNORMAL HIGH (ref 11.5–15.5)
WBC: 9.7 10*3/uL (ref 4.0–10.5)
nRBC: 0 % (ref 0.0–0.2)

## 2019-08-03 MED ORDER — POLYETHYLENE GLYCOL 3350 17 G PO PACK
17.0000 g | PACK | Freq: Two times a day (BID) | ORAL | Status: DC
  Administered 2019-08-03 – 2019-08-04 (×3): 17 g via ORAL
  Filled 2019-08-03 (×3): qty 1

## 2019-08-03 MED ORDER — BISACODYL 5 MG PO TBEC
5.0000 mg | DELAYED_RELEASE_TABLET | Freq: Once | ORAL | Status: AC
  Administered 2019-08-03: 5 mg via ORAL
  Filled 2019-08-03: qty 1

## 2019-08-03 NOTE — Plan of Care (Signed)

## 2019-08-03 NOTE — Plan of Care (Addendum)
Wound care order to L foot needs to be D/C'd.   Problem: Education: Goal: Knowledge of General Education information will improve Description: Including pain rating scale, medication(s)/side effects and non-pharmacologic comfort measures Outcome: Progressing   Problem: Activity: Goal: Risk for activity intolerance will decrease Outcome: Progressing   Problem: Pain Managment: Goal: General experience of comfort will improve Outcome: Progressing   Problem: Safety: Goal: Ability to remain free from injury will improve Outcome: Progressing

## 2019-08-03 NOTE — Progress Notes (Signed)
PROGRESS NOTE    [name]  HKV:[id number] DOB: 03-Jan-1951 DOA: 07/25/2019 PCP: [name], MD   Brief Narrative: -68 year-old with past medical history significant for dementia, hypertension, hyperlipidemia, coronary artery disease, paroxysmal A. 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  X-ray of the left foot showed findings suspicious for osteomyelitis, follow with an MRI of the left foot which show; Soft tissue ulceration at the lateral aspect of the foot at the level of the fifth metatarsal base with underlying acute osteomyelitis of the lateral aspect of the fifth metatarsal base. Area of non-enhancement of the soft tissues surrounding the ulceration concerning for tissue necrosis. Thickening and irregularity of the peroneus brevis tendon at its insertion on the fifth metatarsal base, likely reactive. Marked soft tissue swelling of the dorsum of the forefoot. Orthopedic was consulted on Eliquis was held previous to amputation. Patient underwent AKA on 07/29/2019 by Dr. Sharol Given. Currently patient is awaiting a skilled nursing facility placement.    Assessment & Plan:   Active Problems:   Dementia ([place])   Hyperlipidemia   Hypertension   Paroxysmal atrial fibrillation (HCC)   Foot ulcer (HCC)   Osteomyelitis (HCC)   Protein-calorie malnutrition, severe (HCC)   PVD (peripheral vascular disease) (HCC)   Malnutrition of moderate degree   Knee contracture, left   1-Sepsis secondary to osteomyelitis of the left fifth metatarsal bone. Patient presented with lactic acidosis, hypotension and tachycardia. Patient underwent a s/p left AKA. He was treated initially with IV fluids and IV antibiotics. Sepsis has resolved.  2-Osteomyelitis of the left fifth metatarsal bone ; Patient was started on IV antibiotics vancomycin and Zosyn which has been discontinued after surgery. Currently has a  wound VAC in place. S/P AKA on the left on 11/25\2020. Orthopedic following.  History of CVA: Continue with aspirin and Lipitor  Hypertension: Blood pressure has been soft, I will discontinue hydralazine Improved with IV fluids.  Moderate protein caloric malnutrition: Continue with supplements.  Microcytic anemia iron level 49.  Continue with supplements. Hemoglobin is stable at around 9  Paroxysmal A. fib: Continue with Eliquis. Continue with amiodarone.  Positive HCV antibody: HCV [id number].Elevated AST at 70, normal alk phos and normal ALT. Bilirubin at 0.2 liver is grossly normal. Recommend outpatient follow up with ID/ GI for HCV treatment.   AAA: Ultrasound incidental finding of Abdominal aortic diameter of 2.8 cm. Ectatic abdominal aorta at risk for aneurysm development. Recommend followup by ultrasound in 5 years.Stable.  Peripheral vascular disease: Pt had undergone aortogram with lower extremity in 03/2019 by Dr Trula Slade Showing Bilateral superficial femoral artery occlusions without reconstitution of a named distal vessel. Pt noted to be not a candidate for revascularization either open or percutaneous. Currently stable.  History of dementia: Stable Constipation: Continue with Colace and Senokot added Dulcolax Pressure Injury 03/15/19 Buttocks Stage II -  Partial thickness loss of dermis presenting as a shallow open ulcer with a red, pink wound bed without slough. (Active)  03/15/19 0940  Location: Buttocks  Location Orientation:   Staging: Stage II -  Partial thickness loss of dermis presenting as a shallow open ulcer with a red, pink wound bed without slough.  Wound Description (Comments):   Present on Admission: Yes     Pressure Injury 03/15/19 Foot Right Unstageable - Full thickness tissue loss in which the base of the ulcer is covered by slough (yellow, tan, gray, green or brown) and/or  eschar (tan, brown or black) in the wound bed. (Active)  03/15/19 0941   Location: Foot  Location Orientation: Right  Staging: Unstageable - Full thickness tissue loss in which the base of the ulcer is covered by slough (yellow, tan, gray, green or brown) and/or eschar (tan, brown or black) in the wound bed.  Wound Description (Comments):   Present on Admission: Yes     Nutrition Problem: Moderate Malnutrition Etiology: chronic illness(CVA, dementia)    Signs/Symptoms: percent weight loss, mild fat depletion, moderate fat depletion, mild muscle depletion, moderate muscle depletion Percent weight loss: 12.1 %    Interventions: MVI, Ensure Enlive (each supplement provides 350kcal and 20 grams of protein)  Estimated body mass index is 24.73 kg/m as calculated from the following:   Height as of this encounter: _0  (1.676 m).   Weight as of this encounter: 69.5 kg.   DVT prophylaxis: Eliquis Code Status: Full code Family Communication: Discussed with patient Disposition Plan: Status post surgery, awaiting a skilled nursing facility Consultants:   Orthopedic  Procedures:   Left AKA  Antimicrobials:  None Subjective: Denies abdominal pain, shortness of breath.  No bowel movement in several days. Objective: Vitals:   08/03/19 0500 08/03/19 0520 08/03/19 0755 08/03/19 1357  BP:  (!) 114/55 129/67 (!) 103/52  Pulse:   71 78  Resp:   18 19  Temp:   98.9 F (37.2 C) 98.7 F (37.1 C)  TempSrc:   Oral Oral  SpO2:   96% 99%  Weight: 69.5 kg     Height:        Intake/Output Summary (Last 24 hours) at 08/03/2019 1533 Last data filed at 08/03/2019 1000 Gross per 24 hour  Intake 480 ml  Output 902 ml  Net -422 ml   Filed Weights   07/30/19 0436 08/01/19 0500 08/03/19 0500  Weight: 68.1 kg 68 kg 69.5 kg    Examination:  General exam: Appears calm and comfortable  Respiratory system: Clear to auscultation. Respiratory effort normal. Cardiovascular system: S1 & S2 heard, RRR. No JVD, murmurs, rubs, gallops or clicks. No pedal edema.  Gastrointestinal system: Abdomen is nondistended, soft and nontender. No organomegaly or masses felt. Normal bowel sounds heard. Central nervous system: Alert and oriented. No focal neurological deficits. Extremities: Left AKA with wound VAC. Skin: No rashes, lesions or ulcers    Data Reviewed: I have personally reviewed following labs and imaging studies  CBC: Recent Labs  Lab 07/29/19 0440 07/30/19 0316 08/01/19 0333 08/02/19 0258 08/03/19 0342  WBC 8.4 10.4 10.6* 11.4* 9.7  NEUTROABS  --   --   --  8.0* 5.6  HGB 11.5* 9.6* 9.2* 9.8* 9.3*  HCT 35.4* 29.5* 28.3* 29.5* 27.5*  MCV 77.1* 77.0* 77.1* 76.8* 74.7*  PLT 308 276 305 319 700   Basic Metabolic Panel: Recent Labs  Lab 07/29/19 0440 07/30/19 0316 08/01/19 0333 08/02/19 0258 08/03/19 0342  NA 140 140 142 135 138  K 3.8 3.8 4.9 5.0 4.6  CL 109 112* 110 109 106  CO2 23 20* 22 21* 21*  GLUCOSE 98 141* 121* 108* 91  BUN _1 24*  CREATININE 1.25* 1.25* 1.14 1.07 1.24  CALCIUM 8.6* 8.4* 8.5* 8.5* 8.0*   GFR: Estimated Creatinine Clearance: 52.2 mL/min (by C-G formula based on SCr of 1.24 mg/dL). Liver Function Tests: Recent Labs  Lab 07/30/19 0316  AST 51*  ALT 28  ALKPHOS 49  BILITOT 0.8  PROT 5.0*  ALBUMIN 1.9*  No results for input(s): LIPASE, AMYLASE in the last 168 hours. No results for input(s): AMMONIA in the last 168 hours. Coagulation Profile: No results for input(s): INR, PROTIME in the last 168 hours. Cardiac Enzymes: No results for input(s): CKTOTAL, CKMB, CKMBINDEX, TROPONINI in the last 168 hours. BNP (last 3 results) No results for input(s): PROBNP in the last 8760 hours. HbA1C: No results for input(s): HGBA1C in the last 72 hours. CBG: Recent Labs  Lab 07/29/19 0608 07/29/19 1128 07/29/19 1607 07/29/19 2201 07/30/19 0623  GLUCAP 103* 91 105* 111* 123*   Lipid Profile: No results for input(s): CHOL, HDL, LDLCALC, TRIG, CHOLHDL, LDLDIRECT in the last 72 hours. Thyroid  Function Tests: No results for input(s): TSH, T4TOTAL, FREET4, T3FREE, THYROIDAB in the last 72 hours. Anemia Panel: No results for input(s): VITAMINB12, FOLATE, FERRITIN, TIBC, IRON, RETICCTPCT in the last 72 hours. Sepsis Labs: Recent Labs  Lab 07/28/19 0505  PROCALCITON 0.46    Recent Results (from the past 240 hour(s))  Wound or Superficial Culture     Status: Abnormal   Collection Time: 07/25/19  4:28 PM   Specimen: Wound  Result Value Ref Range Status   Specimen Description WOUND  Final   Special Requests Normal  Final   Gram Stain   Final    RARE WBC PRESENT, PREDOMINANTLY PMN MODERATE GRAM POSITIVE RODS FEW GRAM POSITIVE COCCI FEW GRAM NEGATIVE RODS Performed at [facility], [place] [address], [place], [place] [address]    Culture MULTIPLE ORGANISMS PRESENT, NONE PREDOMINANT (A)  Final   Report Status 07/27/2019 FINAL  Final  SARS CORONAVIRUS 2 (TAT 6-24 HRS) Nasopharyngeal Nasopharyngeal Swab     Status: None   Collection Time: 07/25/19  8:38 PM   Specimen: Nasopharyngeal Swab  Result Value Ref Range Status   SARS Coronavirus 2 NEGATIVE NEGATIVE Final    Comment: (NOTE) SARS-CoV-2 target nucleic acids are NOT DETECTED. The SARS-CoV-2 RNA is generally detectable in upper and lower respiratory specimens during the acute phase of infection. Negative results do not preclude SARS-CoV-2 infection, do not rule out co-infections with other pathogens, and should not be used as the sole basis for treatment or other patient management decisions. Negative results must be combined with clinical observations, patient history, and epidemiological information. The expected result is Negative. Fact Sheet for Patients: SugarRoll.be Fact Sheet for Healthcare Providers: https://www.woods-mathews.com/ This test is not yet approved or cleared by the [place] FDA and  has been authorized for detection and/or diagnosis of SARS-CoV-2 by  FDA under an Emergency Use Authorization (EUA). This EUA will remain  in effect (meaning this test can be used) for the duration of the COVID-19 declaration under Section 56 4(b)(1) of the Act, 21 U.S.C. section 360bbb-3(b)(1), unless the authorization is terminated or revoked sooner. Performed at [facility], [place] [address], [place], [place] [address]   Surgical pcr screen     Status: None   Collection Time: 07/29/19  4:37 AM   Specimen: Nasal Mucosa; Nasal Swab  Result Value Ref Range Status   MRSA, PCR NEGATIVE NEGATIVE Final   Staphylococcus aureus NEGATIVE NEGATIVE Final    Comment: (NOTE) The Xpert SA Assay (FDA approved for NASAL specimens in patients 36 years of age and older), is one component of a comprehensive surveillance program. It is not intended to diagnose infection nor to guide or monitor treatment. Performed at [facility], [place] [address], [place], [place] [address]   SARS CORONAVIRUS 2 (TAT  6-24 HRS) Nasopharyngeal Nasopharyngeal Swab     Status: None   Collection Time: 08/02/19  8:06 PM   Specimen: Nasopharyngeal Swab  Result Value Ref Range Status   SARS Coronavirus 2 NEGATIVE NEGATIVE Final    Comment: (NOTE) SARS-CoV-2 target nucleic acids are NOT DETECTED. The SARS-CoV-2 RNA is generally detectable in upper and lower respiratory specimens during the acute phase of infection. Negative results do not preclude SARS-CoV-2 infection, do not rule out co-infections with other pathogens, and should not be used as the sole basis for treatment or other patient management decisions. Negative results must be combined with clinical observations, patient history, and epidemiological information. The expected result is Negative. Fact Sheet for Patients: SugarRoll.be Fact Sheet for Healthcare Providers: https://www.woods-mathews.com/ This test is not yet approved or cleared by the [place] FDA and  has  been authorized for detection and/or diagnosis of SARS-CoV-2 by FDA under an Emergency Use Authorization (EUA). This EUA will remain  in effect (meaning this test can be used) for the duration of the COVID-19 declaration under Section 56 4(b)(1) of the Act, 21 U.S.C. section 360bbb-3(b)(1), unless the authorization is terminated or revoked sooner. Performed at [facility], [place] [address] [place], [place] [address]          Radiology Studies: No results found.      Scheduled Meds: . amiodarone  200 mg Oral BID  . apixaban  5 mg Oral BID  . aspirin EC  81 mg Oral Daily  . atorvastatin  40 mg Oral QHS  . docusate sodium  100 mg Oral BID  . feeding supplement (ENSURE ENLIVE)  237 mL Oral TID BM  . folic acid  1 mg Oral Daily  . Gerhardt's butt cream  1 application Topical Daily  . multivitamin with minerals  1 tablet Oral Daily  . polyethylene glycol  17 g Oral BID  . sodium chloride flush  3 mL Intravenous Once   Continuous Infusions: . lactated ringers 10 mL/hr at 07/30/19 2030  . lactated ringers 10 mL/hr at 07/29/19 1222     LOS: 9 days    Time spent: 35 minutes.     Elmarie Shiley, MD [facility]   If 7PM-7AM, please contact night-coverage [email or web address] Password [facility] 08/03/2019, 3:33 PM

## 2019-08-03 NOTE — Care Management Important Message (Signed)
Important Message  Patient Details  Name: [name] MRN: [id number] Date of Birth: 14-Dec-1950   Medicare Important Message Given:  Yes     Memory Argue 08/03/2019, 1:45 PM

## 2019-08-03 NOTE — Anesthesia Postprocedure Evaluation (Signed)
Anesthesia Post Note  Patient: [name]  Procedure(s) Performed: LEFT ABOVE KNEE AMPUTATION (Left Knee)     Patient location during evaluation: PACU Anesthesia Type: General Level of consciousness: sedated Pain management: pain level controlled Vital Signs Assessment: post-procedure vital signs reviewed and stable Respiratory status: spontaneous breathing and respiratory function stable Cardiovascular status: stable Postop Assessment: no apparent nausea or vomiting Anesthetic complications: no    Pain Goal: Patients Stated Pain Goal: 2 (07/31/19 1445)                 [place]

## 2019-08-03 NOTE — Progress Notes (Signed)
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  X-ray of the left foot show signs suspicious for osteomyelitis. Pt underwent L AKA on 11/25.    PT Comments    Pt pleasantly confused with IV out and incontinent bM on arrival. Pt stating he was going to get to the bathroom and that he could get up and walk. Pt with improved mobility able to perform A/P transfer bed to chair with education for sequence, balance and progression. Will continue to follow.    Follow Up Recommendations  Supervision/Assistance - 24 hour;SNF     Equipment Recommendations  Wheelchair (measurements PT);Wheelchair cushion (measurements PT)    Recommendations for Other Services       Precautions / Restrictions Precautions Precautions: Fall Precaution Comments: bil AKA, VAC Restrictions Weight Bearing Restrictions: Yes RLE Weight Bearing: Non weight bearing LLE Weight Bearing: Non weight bearing    Mobility  Bed Mobility Overal bed mobility: Needs Assistance Bed Mobility: Supine to Sit;Rolling Rolling: Mod assist   Supine to sit: Mod assist     General bed mobility comments: rolling left x 3 with use of rail with cues and assist for pericare and linen change. Pt with mod assist to achieve long sitting from supine with bil bed rails  Transfers Overall transfer level: Needs assistance   Transfers: Comptroller transfers: Max assist   General transfer comment: mod assist to pivot 90 degrees in bed with cues for sequence and generally mod assist with sliding back into chair with 1 significant posterior LOB  with max assist to recover balance and complete transfer  Ambulation/Gait                 Stairs             Wheelchair Mobility    Modified Rankin (Stroke Patients Only)       Balance Overall balance assessment: Needs assistance Sitting-balance support: Bilateral upper extremity supported Sitting balance-Leahy Scale: Poor Sitting balance - Comments: bil UE support for long sitting and sitting balance                                    Cognition Arousal/Alertness: Awake/alert Behavior During Therapy: WFL for tasks assessed/performed Overall Cognitive Status: No family/caregiver present to determine baseline cognitive functioning Area of Impairment: Orientation;Attention;Memory;Following commands;Safety/judgement;Problem solving                 Orientation Level: Disoriented to;Situation;Time Current Attention Level: Sustained Memory: Decreased short-term memory Following Commands: Follows one step commands consistently Safety/Judgement: Decreased awareness of deficits;Decreased awareness of safety   Problem Solving: Slow processing;Requires verbal cues;Requires tactile cues General Comments: pt had pulled IV out on arrival and incontinent of BM unaware of both. pt unaware of day or month and unable to recall what month would come next. Pt with poor awareness of safety and deficits stating "I'm gonna get to the bathroom"      Exercises      General Comments        Pertinent Vitals/Pain Pain Assessment: No/denies pain    Home Living  Prior Function            PT Goals (current goals can now be found in the care plan section) Progress towards PT goals: Progressing toward goals    Frequency    Min 3X/week      PT Plan Current plan remains appropriate    Co-evaluation              AM-PAC PT "6 Clicks" Mobility   Outcome Measure  Help needed turning from your back to your side while  in a flat bed without using bedrails?: A Lot Help needed moving from lying on your back to sitting on the side of a flat bed without using bedrails?: A Lot Help needed moving to and from a bed to a chair (including a wheelchair)?: Total Help needed standing up from a chair using your arms (e.g., wheelchair or bedside chair)?: Total Help needed to walk in hospital room?: Total Help needed climbing 3-5 steps with a railing? : Total 6 Click Score: 8    End of Session   Activity Tolerance: Patient tolerated treatment well Patient left: in chair;with call bell/phone within reach;with chair alarm set Nurse Communication: Mobility status PT Visit Diagnosis: Other abnormalities of gait and mobility (R26.89);Muscle weakness (generalized) (M62.81)     Time: [id number] PT Time Calculation (min) (ACUTE ONLY): 18 min  Charges:  $Therapeutic Activity: 8-22 mins                     Kamille Toomey P, PT Acute Rehabilitation Services Pager: [phone] Office: [place] [facility] Fluty 08/03/2019, 12:35 PM

## 2019-08-03 NOTE — Progress Notes (Signed)
POD#5 Left AKA. Doing well. No change in VAC output. Comfortable  VSS afebrile. VAC functioning . Dressing in place. SNF Pending. Will Need follow up Dr. Sharol Given early next Week.

## 2019-08-04 MED ORDER — DOCUSATE SODIUM 100 MG PO CAPS: 100.0000 mg | ORAL_CAPSULE | Freq: Two times a day (BID) | ORAL | 0 refills | Status: AC

## 2019-08-04 NOTE — Discharge Summary (Addendum)
Physician Discharge Summary  [name] [id number] DOB: May 17, 1951 DOA: 07/25/2019  PCP: [name], MD  Admit date: 07/25/2019 Discharge date: 08/04/2019  Admitted From: home Discharge disposition: home   Recommendations for Outpatient Follow-Up:   1. Home health ([facility] 24/7) 2. Wound vac 3. Follow up with Dr. Sharol Given 4. Cbc, bmp 1 week 5. Return to hospital for fever   Discharge Diagnosis:   Active Problems:   Dementia (HCC)   Hyperlipidemia   Hypertension   Paroxysmal atrial fibrillation (HCC)   Foot ulcer (HCC)   Osteomyelitis (HCC)   Protein-calorie malnutrition, severe (HCC)   PVD (peripheral vascular disease) (HCC)   Malnutrition of moderate degree   Knee contracture, left    Discharge Condition: Improved.  Diet recommendation: Low sodium, heart healthy.  Carbohydrate-modified.  Regular.  Wound care: None.  Code status: Full.   History of Present Illness:   68 year-old with past medical history significant for dementia, hypertension, hyperlipidemia, coronary artery disease, paroxysmal A. 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  X-ray of the left foot showed findings suspicious for osteomyelitis, follow with an MRI of the left foot which show; Soft tissue ulceration at the lateral aspect of the foot at the level of the fifth metatarsal base with underlying acute osteomyelitis of the lateral aspect of the fifth metatarsal base Patient underwent AKA on 07/29/2019 by Dr. Sharol Given.   Hospital Course by Problem:   Fever (do not think was accurate) -no source of infection -taken around breakfast so ? If had something hot vs under a lot of covers -on recheck w/o any intervention it resolved -blood cultures drawn and pending -no WBC count  Sepsis secondary to osteomyelitis of the left fifth metatarsal bone. Patient presented with lactic  acidosis, hypotension and tachycardia. Patient underwent a s/p left AKA. He was treated initially with IV fluids and IV antibiotics. Sepsis has resolved.  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 Currently has a wound VAC in place. S/P AKA on the left on 11/25\2020.   History of CVA: Continue with aspirin and Lipitor  Hypertension: Blood pressure has been soft -d/c hydralazine  Microcytic anemia iron level 49.  Continue with supplements. Hemoglobin is stable at around 9  Paroxysmal A. fib: Continue with Eliquis. Continue with amiodarone.  Positive HCV antibody: HCV [id number].Elevated AST at 70, normal alk phos and normal ALT. Bilirubin at 0.2 liver is grossly normal. Recommend outpatient follow up with ID/ GI for HCV treatment.   AAA: Ultrasound incidental finding of Abdominal aortic diameter of 2.8 cm. Ectatic abdominal aorta at risk for aneurysm development. Recommend followup by ultrasound in 5 years.Stable.  Peripheral vascular disease: Pt had undergone aortogram with lower extremity in 03/2019 by Dr Trula Slade Showing Bilateral superficial femoral artery occlusions without reconstitution of a named distal vessel. Pt noted to be not a candidate for revascularization either open or percutaneous.Currently stable.  History of dementia: Stable  Constipation: Continue with Colace and Senokot added Dulcolax  Pressure injury Pressure Injury 03/15/19 Buttocks Stage II -  Partial thickness loss of dermis presenting as a shallow open ulcer with a red, pink wound bed without slough. (Active)  03/15/19 0940  Location: Buttocks  Location Orientation:   Staging: Stage II -  Partial thickness loss of dermis presenting as a  shallow open ulcer with a red, pink wound bed without slough.  Wound Description (Comments):   Present on Admission: Yes     Pressure Injury  03/15/19 Foot Right Unstageable - Full thickness tissue loss in which the base of the ulcer is covered by slough (yellow, tan, gray, green or brown) and/or eschar (tan, brown or black) in the wound bed. (Active)  03/15/19 0941  Location: Foot  Location Orientation: Right  Staging: Unstageable - Full thickness tissue loss in which the base of the ulcer is covered by slough (yellow, tan, gray, green or brown) and/or eschar (tan, brown or black) in the wound bed.  Wound Description (Comments):   Present on Admission: Yes     Nutrition Problem: Moderate Malnutrition Etiology: chronic illness(CVA, dementia)  Signs/Symptoms: percent weight loss, mild fat depletion, moderate fat depletion, mild muscle depletion, moderate muscle depletion Percent weight loss: 12.1 %  Interventions: MVI, Ensure Enlive (each supplement provides 350kcal and 20 grams of protein)      Medical Consultants:   duda   Discharge Exam:   Vitals:   08/04/19 0839 08/04/19 1215  BP: (!) 127/57   Pulse: 77   Resp: 18   Temp: (!) 100.4 F (38 C) 98.9 F (37.2 C)  SpO2: 98%    Vitals:   08/03/19 2250 08/04/19 0315 08/04/19 0839 08/04/19 1215  BP: (!) 141/69 (!) 122/59 (!) 127/57   Pulse: 79 80 77   Resp:  16 18   Temp:  98.4 F (36.9 C) (!) 100.4 F (38 C) 98.9 F (37.2 C)  TempSrc:  Oral Oral Oral  SpO2:  98% 98%   Weight:      Height:        General exam: Appears calm and comfortable.  The results of significant diagnostics from this hospitalization (including imaging, microbiology, ancillary and laboratory) are listed below for reference.     Procedures and Diagnostic Studies:   Dg Ankle Complete Left  Result Date: 07/25/2019 CLINICAL DATA:  Edema of left foot and ankle. Ulcers on medial and lateral left foot. Right leg amputated EXAM: LEFT ANKLE COMPLETE - 3+ VIEW COMPARISON:  02/20/2019 FINDINGS: No fracture or bone lesion. No bone resorption to suggest osteomyelitis. Ankle joint  normally spaced and aligned.  No arthropathic changes. There is diffuse soft tissue swelling. No soft tissue air. Arterial vascular calcifications are noted along the posterior tibial and dorsalis pedis arteries. IMPRESSION: 1. No fracture, ankle joint abnormality or evidence of osteomyelitis. 2. Diffuse soft tissue swelling. Electronically Signed   By: Lajean Manes M.D.   On: 07/25/2019 17:31   US Abdomen Complete  Result Date: 07/25/2019 CLINICAL DATA:  Abnormal LFT and creatinine EXAM: ABDOMEN ULTRASOUND COMPLETE COMPARISON:  None. FINDINGS: Gallbladder: No gallstones or wall thickening visualized. No sonographic Murphy sign noted by sonographer. Common bile duct: Diameter: 1.7 mm Liver: No focal lesion identified. Within normal limits in parenchymal echogenicity. Portal vein is patent on color Doppler imaging with normal direction of blood flow towards the liver. IVC: No abnormality visualized. Limited visualization due to bowel gas. Pancreas: Visualized portion unremarkable. Limited visualization due to bowel gas. Spleen: Size and appearance within normal limits. Right Kidney: Length: 9.4 cm. Echogenicity within normal limits. No mass or hydronephrosis visualized. Left Kidney: Length: 8.6 cm. Echogenicity within normal limits. No mass or hydronephrosis visualized. Abdominal aorta: No aneurysm visualized.  Maximum diameter of 2.8 cm Other findings: None. IMPRESSION: 1. Negative for gallstones or biliary dilatation. Liver is grossly unremarkable. 2. Abdominal  aortic diameter of 2.8 cm. Ectatic abdominal aorta at risk for aneurysm development. Recommend followup by ultrasound in 5 years. This recommendation follows ACR consensus guidelines: White Paper of the ACR Incidental Findings Committee II on Vascular Findings. J Am Coll Radiol 2013; 10:789-794. Electronically Signed   By: Donavan Foil M.D.   On: 07/25/2019 20:27   Mr Foot Left W Wo Contrast  Result Date: 07/26/2019 CLINICAL DATA:  Left foot pain  and swelling. Abnormal x-ray EXAM: MRI OF THE LEFT FOREFOOT WITHOUT AND WITH CONTRAST TECHNIQUE: Multiplanar, multisequence MR imaging of the left forefoot was performed both before and after administration of intravenous contrast. CONTRAST:  36m GADAVIST GADOBUTROL 1 MMOL/ML IV SOLN COMPARISON:  X-ray 07/25/2019 FINDINGS: Technical note: Examination quality is degraded by patient's inability to follow commands, positioning, and motion artifact. Best possible images were obtained. Bones/Joint/Cartilage Marrow edema and enhancement within the lateral aspect of the fifth metatarsal base with associated confluent low T1 signal changes consistent with acute osteomyelitis (series 4, image 14). There is poor definition of the overlying cortex suggesting associated cortical destruction. Bone marrow signal abnormality extends distally to the level of the proximal metaphysis but does not extend into the fifth metatarsal diaphysis. Preservation of the fatty marrow signal within the opposing cuboid. No additional areas of marrow replacement are identified within the forefoot. No fractures. Ligaments The intrinsic foot ligaments including the Lisfranc ligament are intact. Muscles and Tendons Diffuse muscular atrophy in fatty infiltration with mild associated intramuscular edema. The peroneus brevis tendon appears thickened and irregular at its insertion on the fifth metatarsal base. Flexor and extensor tendons appear otherwise intact. No tenosynovitis. Soft tissues Soft tissue ulceration at the lateral aspect of the foot at the level of the fifth metatarsal base with hypoenhancement of the cutaneous and subcutaneous soft tissues involving an approximately 3.3 x 0.8 x 3.3 cm area (series 9, image 37; series 10, image 18) concerning for tissue necrosis. No soft tissue fluid collections. There is marked soft tissue swelling of the dorsum of the forefoot. IMPRESSION: 1. Soft tissue ulceration at the lateral aspect of the foot at  the level of the fifth metatarsal base with underlying acute osteomyelitis of the lateral aspect of the fifth metatarsal base. 2. Area of non-enhancement of the soft tissues surrounding the ulceration concerning for tissue necrosis. 3. Thickening and irregularity of the peroneus brevis tendon at its insertion on the fifth metatarsal base, likely reactive. 4. Marked soft tissue swelling of the dorsum of the forefoot. These results will be called to the ordering clinician or representative by the Radiologist Assistant, and communication documented in the PACS or zVision Dashboard. Electronically Signed   By: NDavina PokeM.D.   On: 07/26/2019 10:41   Dg Foot Complete Left  Result Date: 07/25/2019 CLINICAL DATA:  Edema of left foot and ankle. Ulcers on medial and lateral left foot. Right leg amputated EXAM: LEFT FOOT - COMPLETE 3+ VIEW COMPARISON:  02/20/2019 FINDINGS: There is subtle cortical resorption along the lateral base of the fifth metatarsal, which is new since the prior exam. No other areas of bone resorption.  No fracture or bone lesion. Joints are normally spaced and aligned. There is diffuse ankle soft tissue swelling, which extends to the foot. No soft tissue air. IMPRESSION: 1. Subtle cortical resorption noted along the lateral base of the fifth metatarsal suspicious for osteomyelitis. 2. No other evidence of osteomyelitis.  No fracture or bone lesion. 3. Diffuse soft tissue swelling.  No soft tissue air. Electronically  Signed   By: Lajean Manes M.D.   On: 07/25/2019 17:34     Labs:   Basic Metabolic Panel: Recent Labs  Lab 07/29/19 0440 07/30/19 0316 08/01/19 0333 08/02/19 0258 08/03/19 0342  NA 140 140 142 135 138  K 3.8 3.8 4.9 5.0 4.6  CL 109 112* 110 109 106  CO2 23 20* 22 21* 21*  GLUCOSE 98 141* 121* 108* 91  BUN _0 24*  CREATININE 1.25* 1.25* 1.14 1.07 1.24  CALCIUM 8.6* 8.4* 8.5* 8.5* 8.0*   GFR Estimated Creatinine Clearance: 52.2 mL/min (by C-G  formula based on SCr of 1.24 mg/dL). Liver Function Tests: Recent Labs  Lab 07/30/19 0316  AST 51*  ALT 28  ALKPHOS 49  BILITOT 0.8  PROT 5.0*  ALBUMIN 1.9*   No results for input(s): LIPASE, AMYLASE in the last 168 hours. No results for input(s): AMMONIA in the last 168 hours. Coagulation profile No results for input(s): INR, PROTIME in the last 168 hours.  CBC: Recent Labs  Lab 07/29/19 0440 07/30/19 0316 08/01/19 0333 08/02/19 0258 08/03/19 0342  WBC 8.4 10.4 10.6* 11.4* 9.7  NEUTROABS  --   --   --  8.0* 5.6  HGB 11.5* 9.6* 9.2* 9.8* 9.3*  HCT 35.4* 29.5* 28.3* 29.5* 27.5*  MCV 77.1* 77.0* 77.1* 76.8* 74.7*  PLT 308 276 305 319 343   Cardiac Enzymes: No results for input(s): CKTOTAL, CKMB, CKMBINDEX, TROPONINI in the last 168 hours. BNP: Invalid input(s): POCBNP CBG: Recent Labs  Lab 07/29/19 0608 07/29/19 1128 07/29/19 1607 07/29/19 2201 07/30/19 0623  GLUCAP 103* 91 105* 111* 123*   D-Dimer No results for input(s): DDIMER in the last 72 hours. Hgb A1c No results for input(s): HGBA1C in the last 72 hours. Lipid Profile No results for input(s): CHOL, HDL, LDLCALC, TRIG, CHOLHDL, LDLDIRECT in the last 72 hours. Thyroid function studies No results for input(s): TSH, T4TOTAL, T3FREE, THYROIDAB in the last 72 hours.  Invalid input(s): FREET3 Anemia work up No results for input(s): VITAMINB12, FOLATE, FERRITIN, TIBC, IRON, RETICCTPCT in the last 72 hours. Microbiology Recent Results (from the past 240 hour(s))  Wound or Superficial Culture     Status: Abnormal   Collection Time: 07/25/19  4:28 PM   Specimen: Wound  Result Value Ref Range Status   Specimen Description WOUND  Final   Special Requests Normal  Final   Gram Stain   Final    RARE WBC PRESENT, PREDOMINANTLY PMN MODERATE GRAM POSITIVE RODS FEW GRAM POSITIVE COCCI FEW GRAM NEGATIVE RODS Performed at [facility], [address], [place],  [address]    Culture MULTIPLE  ORGANISMS PRESENT, NONE PREDOMINANT (A)  Final   Report Status 07/27/2019 FINAL  Final  SARS CORONAVIRUS 2 (TAT 6-24 HRS) Nasopharyngeal Nasopharyngeal Swab     Status: None   Collection Time: 07/25/19  8:38 PM   Specimen: Nasopharyngeal Swab  Result Value Ref Range Status   SARS Coronavirus 2 NEGATIVE NEGATIVE Final    Comment: (NOTE) SARS-CoV-2 target nucleic acids are NOT DETECTED. The SARS-CoV-2 RNA is generally detectable in upper and lower respiratory specimens during the acute phase of infection. Negative results do not preclude SARS-CoV-2 infection, do not rule out co-infections with other pathogens, and should not be used as the sole basis for treatment or other patient management decisions. Negative results must be combined with clinical observations, patient history, and epidemiological information. The expected result is Negative. Fact Sheet for Patients: SugarRoll.be  Fact Sheet for Healthcare Providers: https://www.woods-mathews.com/ This test is not yet approved or cleared by the [place] FDA and  has been authorized for detection and/or diagnosis of SARS-CoV-2 by FDA under an Emergency Use Authorization (EUA). This EUA will remain  in effect (meaning this test can be used) for the duration of the COVID-19 declaration under Section 56 4(b)(1) of the Act, 21 U.S.C. section 360bbb-3(b)(1), unless the authorization is terminated or revoked sooner. Performed at [facility], [place] [address], [place], [place] [address]   Surgical pcr screen     Status: None   Collection Time: 07/29/19  4:37 AM   Specimen: Nasal Mucosa; Nasal Swab  Result Value Ref Range Status   MRSA, PCR NEGATIVE NEGATIVE Final   Staphylococcus aureus NEGATIVE NEGATIVE Final    Comment: (NOTE) The Xpert SA Assay (FDA approved for NASAL specimens in patients 18 years of age and older), is one component of a comprehensive surveillance program. It is  not intended to diagnose infection nor to guide or monitor treatment. Performed at [facility], [place] [address], [place], [place] [address]   SARS CORONAVIRUS 2 (TAT 6-24 HRS) Nasopharyngeal Nasopharyngeal Swab     Status: None   Collection Time: 08/02/19  8:06 PM   Specimen: Nasopharyngeal Swab  Result Value Ref Range Status   SARS Coronavirus 2 NEGATIVE NEGATIVE Final    Comment: (NOTE) SARS-CoV-2 target nucleic acids are NOT DETECTED. The SARS-CoV-2 RNA is generally detectable in upper and lower respiratory specimens during the acute phase of infection. Negative results do not preclude SARS-CoV-2 infection, do not rule out co-infections with other pathogens, and should not be used as the sole basis for treatment or other patient management decisions. Negative results must be combined with clinical observations, patient history, and epidemiological information. The expected result is Negative. Fact Sheet for Patients: SugarRoll.be Fact Sheet for Healthcare Providers: https://www.woods-mathews.com/ This test is not yet approved or cleared by the [place] FDA and  has been authorized for detection and/or diagnosis of SARS-CoV-2 by FDA under an Emergency Use Authorization (EUA). This EUA will remain  in effect (meaning this test can be used) for the duration of the COVID-19 declaration under Section 56 4(b)(1) of the Act, 21 U.S.C. section 360bbb-3(b)(1), unless the authorization is terminated or revoked sooner. Performed at [facility], [place] [address], [place], [place] [address]      Discharge Instructions:   Discharge Instructions    Discharge instructions   Complete by: As directed    DYS 3 diet Home health Continue would vac   Increase activity slowly   Complete by: As directed    Neg Press Wound Therapy / Incisional   Complete by: As directed    Show patient how to attach prevena vac. Will be removed in  the office   Neg Press Wound Therapy / Incisional   Complete by: As directed    Attach wound VAC dressing to the portable Praveena wound VAC pump.  Teach patient how to plug this and to maintain its charge.     Allergies as of 08/04/2019   No Known Allergies     Medication List    TAKE these medications   amiodarone 200 MG tablet Commonly known as: PACERONE Take 200 mg by mouth 2 (two) times daily.   aspirin EC 81 MG tablet Take 81 mg by mouth daily.   atorvastatin 40 MG tablet Commonly known as: LIPITOR Take 40 mg by mouth at  bedtime.   docusate sodium 100 MG capsule Commonly known as: COLACE Take 1 capsule (100 mg total) by mouth 2 (two) times daily.   Eliquis 5 MG Tabs tablet Generic drug: apixaban Take 5 mg by mouth 2 (two) times daily with a meal.   folic acid 1 MG tablet Commonly known as: FOLVITE Take 1 mg by mouth daily.   Gerhardt's butt cream Crea Apply 1 application topically daily.   oxyCODONE-acetaminophen 5-325 MG tablet Commonly known as: PERCOCET/ROXICET Take 1 tablet by mouth every 6 (six) hours as needed for moderate pain.      Follow-up Information    Newt Minion, MD. Schedule an appointment as soon as possible for a visit in 1 week(s).   Specialty: Orthopedic Surgery Contact information: [place] [address] [phone]        [facility], [facility] Follow up.   Specialty: Home Health Services Why: Home Health Registered Nurse, Physical and Occupational Therapy, Home Health aide and social worker Contact information: [place] [address] [phone]        [name], MD Follow up in 1 week(s).   Specialty: Internal Medicine Contact information: [address] [place] [address] [phone]            Time coordinating discharge: 35 min  Signed:  Geradine Girt DO  [facility] 08/04/2019, 2:16 PM

## 2019-08-04 NOTE — Progress Notes (Signed)
Patient was discharged home by MD order; discharged instructions  review and give to patient's niece; IV DIC; with wound vac on L AK post amputation; patient will be escorted to the car by nurse tech via wheelchair.

## 2019-08-04 NOTE — TOC Progression Note (Addendum)
Transition of Care [facility]) - Progression Note    Patient Details  Name: [name] MRN: [id number] Date of Birth: 02-18-1951  Transition of Care [facility]) CM/SW [place] MSN, RN, NCM-BC, [place] [phone] Phone Number: 08/04/2019, 1:02 PM  Clinical Narrative:    CM following for dispositional needs. CM spoke to the patients niece, [name], who continues to decline SNF d/t increased COVID risks. Niece prefers for the patient to return home with [facility] services provided by [facility] and 24hr supervision/assistance provided by family. Patient has an operating wheelchair from his previous admission. Niece states she's able to provide transportation at discharge. CM team will continue to follow.    Expected Discharge Plan: [place] Barriers to Discharge: No Barriers Identified  Expected Discharge Plan and Services Expected Discharge Plan: [place] In-house Referral: Clinical Social Work Discharge Planning Services: CM Consult Post Acute Care Choice: Resumption of Svcs/PTA Provider, Home Health(e) Living arrangements for the past 2 months: [place]: [place] Date [place]: 08/01/19 Time [place]: [place] Representative spoke with at [place]: [place] ([place]) Interventions    Readmission Risk Interventions Readmission Risk Prevention Plan 08/01/2019 05/14/2019 03/23/2019  Post Dischage Appt - Complete -  Medication Screening - Complete -  Transportation Screening Complete - Complete  PCP or Specialist Appt within 5-7 Days Not Complete - Complete  Not Complete comments office closed on weekend - -  Home Care Screening Complete - Complete  Medication Review (RN CM) Complete - Complete

## 2019-08-04 NOTE — Progress Notes (Addendum)
PROGRESS NOTE    [name]  [id number] DOB: 05-13-1951 DOA: 07/25/2019 PCP: Arthur Mccreedy, MD   Brief Narrative: 68 year-old with past medical history significant for dementia, hypertension, hyperlipidemia, coronary artery disease, paroxysmal A. 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  X-ray of the left foot showed findings suspicious for osteomyelitis, follow with an MRI of the left foot which show; Soft tissue ulceration at the lateral aspect of the foot at the level of the fifth metatarsal base with underlying acute osteomyelitis of the lateral aspect of the fifth metatarsal base Patient underwent AKA on 07/29/2019 by Dr. Sharol [name]. Unlike previously stated, patient is to go home with niece 24 hour care.      Assessment & Plan:   Active Problems:   Dementia ([place])   Hyperlipidemia   Hypertension   Paroxysmal atrial fibrillation (HCC)   Foot ulcer (HCC)   Osteomyelitis (HCC)   Protein-calorie malnutrition, severe (HCC)   PVD (peripheral vascular disease) (HCC)   Malnutrition of moderate degree   Knee contracture, left   Fever -incentive spirometry -blood cultures -COVID 19 negative -IV abx d/c'd after surgery  Sepsis secondary to osteomyelitis of the left fifth metatarsal bone. Patient presented with lactic acidosis, hypotension and tachycardia. Patient underwent a s/p left AKA. He was treated initially with IV fluids and IV antibiotics. Sepsis has resolved.  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 Currently has a wound VAC in place. S/P AKA on the left on 11/25\2020.   History of CVA: Continue with aspirin and [name]  Hypertension: Blood pressure has been soft -d/c hydralazine  Microcytic anemia iron level 49.  Continue with supplements. Hemoglobin is stable  at around 9  Paroxysmal A. fib: Continue with Eliquis. Continue with amiodarone.  Positive HCV antibody: HCV [id number].Elevated AST at 70, normal alk phos and normal ALT. Bilirubin at 0.2 liver is grossly normal. Recommend outpatient follow up with ID/ GI for HCV treatment.   AAA: Ultrasound incidental finding of Abdominal aortic diameter of 2.8 cm. Ectatic abdominal aorta at risk for aneurysm development. Recommend followup by ultrasound in 5 years.Stable.  Peripheral vascular disease: Pt had undergone aortogram with lower extremity in 03/2019 by Dr [name] Showing Bilateral superficial femoral artery occlusions without reconstitution of a named distal vessel. Pt noted to be not a candidate for revascularization either open or percutaneous. Currently stable.  History of dementia: Stable  Constipation: Continue with Colace and Senokot added Dulcolax  Pressure injury Pressure Injury 03/15/19 Buttocks Stage II -  Partial thickness loss of dermis presenting as a shallow open ulcer with a red, pink wound bed without slough. (Active)  03/15/19 0940  Location: Buttocks  Location Orientation:   Staging: Stage II -  Partial thickness loss of dermis presenting as a shallow open ulcer with a red, pink wound bed without slough.  Wound Description (Comments):   Present on Admission: Yes     Pressure Injury 03/15/19 Foot Right Unstageable - Full thickness tissue loss in which the base of the ulcer is covered by slough (yellow, tan, gray, green or brown) and/or eschar (tan, brown or black) in the wound bed. (Active)  03/15/19 0941  Location: Foot  Location Orientation: Right  Staging: Unstageable - Full thickness tissue loss in which the base of the ulcer is covered by slough (yellow, tan, gray, green  or brown) and/or eschar (tan, brown or black) in the wound bed.  Wound Description (Comments):   Present on Admission: Yes     Nutrition Problem: Moderate Malnutrition Etiology: chronic  illness(CVA, dementia)  Signs/Symptoms: percent weight loss, mild fat depletion, moderate fat depletion, mild muscle depletion, moderate muscle depletion Percent weight loss: 12.1 %  Interventions: MVI, Ensure Enlive (each supplement provides 350kcal and 20 grams of protein)    DVT prophylaxis: Eliquis Code Status: Full code Family Communication: Discussed with patient and his neice Disposition Plan: Status post surgery, to go home but now with fever (last note from Education officer, museum indicated home with home health but now plan for SNF-- re-consult social worker)  Consultants:   Ortho  Procedures:   Left AKA   Subjective: Fever this AM   Objective: Vitals:   08/03/19 1937 08/03/19 2250 08/04/19 0315 08/04/19 0839  BP: 127/60 (!) 141/69 (!) 122/59 (!) 127/57  Pulse: 73 79 80 77  Resp: '16  16 18  ' Temp: 98.3 F (36.8 C)  98.4 F (36.9 C) (!) 100.4 F (38 C)  TempSrc: Oral  Oral Oral  SpO2: 99%  98% 98%  Weight:      Height:        Intake/Output Summary (Last 24 hours) at 08/04/2019 1040 Last data filed at 08/04/2019 0900 Gross per 24 hour  Intake 720 ml  Output 700 ml  Net 20 ml   Filed Weights   07/30/19 0436 08/01/19 0500 08/03/19 0500  Weight: 68.1 kg 68 kg 69.5 kg    Examination:  General exam: in bed, NAD Respiratory system: no increased work of breathing Cardiovascular system: rrr Gastrointestinal system: +BS, soft Central nervous system: alert Extremities: left AKA with wound vac    Data Reviewed: I have personally reviewed following labs and imaging studies  CBC: Recent Labs  Lab 07/29/19 0440 07/30/19 0316 08/01/19 0333 08/02/19 0258 08/03/19 0342  WBC 8.4 10.4 10.6* 11.4* 9.7  NEUTROABS  --   --   --  8.0* 5.6  HGB 11.5* 9.6* 9.2* 9.8* 9.3*  HCT 35.4* 29.5* 28.3* 29.5* 27.5*  MCV 77.1* 77.0* 77.1* 76.8* 74.7*  PLT 308 276 305 319 976   Basic Metabolic Panel: Recent Labs  Lab 07/29/19 0440 07/30/19 0316 08/01/19 0333 08/02/19  0258 08/03/19 0342  NA 140 140 142 135 138  K 3.8 3.8 4.9 5.0 4.6  CL 109 112* 110 109 106  CO2 23 20* 22 21* 21*  GLUCOSE 98 141* 121* 108* 91  BUN '11 12 21 20 ' 24*  CREATININE 1.25* 1.25* 1.14 1.07 1.24  CALCIUM 8.6* 8.4* 8.5* 8.5* 8.0*   GFR: Estimated Creatinine Clearance: 52.2 mL/min (by C-G formula based on SCr of 1.24 mg/dL). Liver Function Tests: Recent Labs  Lab 07/30/19 0316  AST 51*  ALT 28  ALKPHOS 49  BILITOT 0.8  PROT 5.0*  ALBUMIN 1.9*   No results for input(s): LIPASE, AMYLASE in the last 168 hours. No results for input(s): AMMONIA in the last 168 hours. Coagulation Profile: No results for input(s): INR, PROTIME in the last 168 hours. Cardiac Enzymes: No results for input(s): CKTOTAL, CKMB, CKMBINDEX, TROPONINI in the last 168 hours. BNP (last 3 results) No results for input(s): PROBNP in the last 8760 hours. HbA1C: No results for input(s): HGBA1C in the last 72 hours. CBG: Recent Labs  Lab 07/29/19 0608 07/29/19 1128 07/29/19 1607 07/29/19 2201 07/30/19 0623  GLUCAP 103* 91 105* 111* 123*   Lipid Profile: No results  for input(s): CHOL, HDL, LDLCALC, TRIG, CHOLHDL, LDLDIRECT in the last 72 hours. Thyroid Function Tests: No results for input(s): TSH, T4TOTAL, FREET4, T3FREE, THYROIDAB in the last 72 hours. Anemia Panel: No results for input(s): VITAMINB12, FOLATE, FERRITIN, TIBC, IRON, RETICCTPCT in the last 72 hours. Sepsis Labs: No results for input(s): PROCALCITON, LATICACIDVEN in the last 168 hours.  Recent Results (from the past 240 hour(s))  Wound or Superficial Culture     Status: Abnormal   Collection Time: 07/25/19  4:28 PM   Specimen: Wound  Result Value Ref Range Status   Specimen Description WOUND  Final   Special Requests Normal  Final   Gram Stain   Final    RARE WBC PRESENT, PREDOMINANTLY PMN MODERATE GRAM POSITIVE RODS FEW GRAM POSITIVE COCCI FEW GRAM NEGATIVE RODS Performed at [facility], [address],  [place], [place] [address]    Culture MULTIPLE ORGANISMS PRESENT, NONE PREDOMINANT (A)  Final   Report Status 07/27/2019 FINAL  Final  SARS CORONAVIRUS 2 (TAT 6-24 HRS) Nasopharyngeal Nasopharyngeal Swab     Status: None   Collection Time: 07/25/19  8:38 PM   Specimen: Nasopharyngeal Swab  Result Value Ref Range Status   SARS Coronavirus 2 NEGATIVE NEGATIVE Final    Comment: (NOTE) SARS-CoV-2 target nucleic acids are NOT DETECTED. The SARS-CoV-2 RNA is generally detectable in upper and lower respiratory specimens during the acute phase of infection. Negative results do not preclude SARS-CoV-2 infection, do not rule out co-infections with other pathogens, and should not be used as the sole basis for treatment or other patient management decisions. Negative results must be combined with clinical observations, patient history, and epidemiological information. The expected result is Negative. Fact Sheet for Patients: SugarRoll.be Fact Sheet for Healthcare Providers: https://www.woods-mathews.com/ This test is not yet approved or cleared by the [place] FDA and  has been authorized for detection and/or diagnosis of SARS-CoV-2 by FDA under an Emergency Use Authorization (EUA). This EUA will remain  in effect (meaning this test can be used) for the duration of the COVID-19 declaration under Section 56 4(b)(1) of the Act, 21 U.S.C. section 360bbb-3(b)(1), unless the authorization is terminated or revoked sooner. Performed at [facility], [place] [address], [place], [place] [address]   Surgical pcr screen     Status: None   Collection Time: 07/29/19  4:37 AM   Specimen: Nasal Mucosa; Nasal Swab  Result Value Ref Range Status   MRSA, PCR NEGATIVE NEGATIVE Final   Staphylococcus aureus NEGATIVE NEGATIVE Final    Comment: (NOTE) The Xpert SA Assay (FDA approved for NASAL specimens in patients 51 years of age and older), is one component of a  comprehensive surveillance program. It is not intended to diagnose infection nor to guide or monitor treatment. Performed at [facility], [place] [address], [place], [place] [address]   SARS CORONAVIRUS 2 (TAT 6-24 HRS) Nasopharyngeal Nasopharyngeal Swab     Status: None   Collection Time: 08/02/19  8:06 PM   Specimen: Nasopharyngeal Swab  Result Value Ref Range Status   SARS Coronavirus 2 NEGATIVE NEGATIVE Final    Comment: (NOTE) SARS-CoV-2 target nucleic acids are NOT DETECTED. The SARS-CoV-2 RNA is generally detectable in upper and lower respiratory specimens during the acute phase of infection. Negative results do not preclude SARS-CoV-2 infection, do not rule out co-infections with other pathogens, and should not be used as the sole basis for treatment or other patient management decisions. Negative results must  be combined with clinical observations, patient history, and epidemiological information. The expected result is Negative. Fact Sheet for Patients: SugarRoll.be Fact Sheet for Healthcare Providers: https://www.woods-mathews.com/ This test is not yet approved or cleared by the [place] FDA and  has been authorized for detection and/or diagnosis of SARS-CoV-2 by FDA under an Emergency Use Authorization (EUA). This EUA will remain  in effect (meaning this test can be used) for the duration of the COVID-19 declaration under Section 56 4(b)(1) of the Act, 21 U.S.C. section 360bbb-3(b)(1), unless the authorization is terminated or revoked sooner. Performed at [facility], [place] [address], [place], [place] [address]          Radiology Studies: No results found.      Scheduled Meds: . amiodarone  200 mg Oral BID  . apixaban  5 mg Oral BID  . aspirin EC  81 mg Oral Daily  . atorvastatin  40 mg Oral QHS  . docusate sodium  100 mg Oral BID  . feeding supplement (ENSURE ENLIVE)  237 mL Oral TID BM  .  folic acid  1 mg Oral Daily  . Gerhardt's butt cream  1 application Topical Daily  . multivitamin with minerals  1 tablet Oral Daily  . polyethylene glycol  17 g Oral BID  . sodium chloride flush  3 mL Intravenous Once   Continuous Infusions: . lactated ringers 10 mL/hr at 07/30/19 2030  . lactated ringers 10 mL/hr at 07/29/19 1222     LOS: 10 days    Time spent: 35 minutes.     Geradine Girt, DO [facility]   If 7PM-7AM, please contact night-coverage [email or web address] Password [email or web address] 08/04/2019, 10:40 AM

## 2019-08-04 NOTE — Progress Notes (Signed)
Occupational Therapy Treatment Patient Details Name: [name] MRN: [id number] DOB: 1951/08/05 Today's Date: 08/04/2019    History of present illness 68 year old gentleman with prior history of dementia, hypertension, hyperlipidemia, coronary artery disease, paroxysmal atrial fibrillation, history of CVA, severe peripheral artery disease s/p right AKA in May 05, 2001 0 presents with left foot wound.  X-ray of the left foot show signs suspicious for osteomyelitis. Pt underwent L AKA on 11/25.   OT comments  Pt in bed upon arrival and agreeable to OT. Pt sat EOB with mod A with increased time and effort using rail. Pt sat EOB for grooming, UB bathing (siumlated) and dressing (donned clean gown) requiring mod - min A for balance/support due to Poor sitting balance. Pt declined AP transfer to recliner when nutritional services entered room to bring snack and did not want to sit EOB any longer. Assisted pt back to supine with HOB raised for pt to eat snakcin safe position. OT will continue to follow acutely  Follow Up Recommendations  SNF;Supervision/Assistance - 24 hour    Equipment Recommendations  Other (comment)(TBD at next venue of care)    Recommendations for Other Services      Precautions / Restrictions Precautions Precautions: Fall Precaution Comments: bil AKA, VAC Restrictions Weight Bearing Restrictions: Yes RLE Weight Bearing: Non weight bearing LLE Weight Bearing: Non weight bearing Other Position/Activity Restrictions: B AKA       Mobility Bed Mobility Overal bed mobility: Needs Assistance Bed Mobility: Supine to Sit;Rolling Rolling: Mod assist   Supine to sit: Mod assist Sit to supine: Min assist   General bed mobility comments: pt sat EOB x 10 minutes for functional tasks with mod - min A for baalnce/support, used rails  Transfers                      Balance Overall balance assessment: Needs assistance Sitting-balance support: Bilateral upper  extremity supported Sitting balance-Leahy Scale: Poor Sitting balance - Comments: bil UE support for long sitting and sitting balance (mod - min A using rail)                                   ADL either performed or assessed with clinical judgement   ADL Overall ADL's : Needs assistance/impaired     Grooming: Minimal assistance;Wash/dry face;Wash/dry Nurse, mental health Details (indicate cue type and reason): Poor sitting balance, used rail to steady self Upper Body Bathing: Moderate assistance;Sitting Upper Body Bathing Details (indicate cue type and reason): simulated     Upper Body Dressing : Moderate assistance;Sitting Upper Body Dressing Details (indicate cue type and reason): Poor sitting balance, used rail to steady self                   General ADL Comments: Poor sitting balance, used rail to steady self sitting EOB for tasks     Vision Patient Visual Report: No change from baseline     Perception     Praxis      Cognition Arousal/Alertness: Awake/alert Behavior During Therapy: WFL for tasks assessed/performed Overall Cognitive Status: No family/caregiver present to determine baseline cognitive functioning Area of Impairment: Orientation;Attention;Memory;Following commands;Safety/judgement;Problem solving                 Orientation Level: Disoriented to;Situation;Time   Memory: Decreased short-term memory Following Commands: Follows one step commands consistently Safety/Judgement: Decreased awareness of deficits;Decreased awareness of safety  Problem Solving: Slow processing;Requires verbal cues;Requires tactile cues          Exercises     Shoulder Instructions       General Comments      Pertinent Vitals/ Pain       Pain Assessment: Faces Faces Pain Scale: Hurts little more Pain Location: L LE Pain Intervention(s): Limited activity within patient's tolerance;Monitored during session;Repositioned  Home Living                                           Prior Functioning/Environment              Frequency  Min 2X/week        Progress Toward Goals  OT Goals(current goals can now be found in the care plan section)  Progress towards OT goals: OT to reassess next treatment     Plan Discharge plan remains appropriate    Co-evaluation                 AM-PAC OT "6 Clicks" Daily Activity     Outcome Measure   Help from another person eating meals?: None Help from another person taking care of personal grooming?: A Little Help from another person toileting, which includes using toliet, bedpan, or urinal?: Total Help from another person bathing (including washing, rinsing, drying)?: A Lot Help from another person to put on and taking off regular upper body clothing?: A Lot Help from another person to put on and taking off regular lower body clothing?: Total 6 Click Score: 13    End of Session    OT Visit Diagnosis: Other abnormalities of gait and mobility (R26.89);Muscle weakness (generalized) (M62.81);Pain Pain - Right/Left: Left Pain - part of body: Leg   Activity Tolerance Patient tolerated treatment well   Patient Left in bed;with call bell/phone within reach;with bed alarm set   Nurse Communication          Time: 1209-1229 OT Time Calculation (min): 20 min  Charges: OT General Charges $OT Visit: 1 Visit OT Treatments $Therapeutic Activity: 8-22 mins     Britt Bottom 08/04/2019, 2:11 PM

## 2019-08-07 ENCOUNTER — Other Ambulatory Visit: Payer: Medicare Other

## 2019-08-09 LAB — CULTURE, BLOOD (ROUTINE X 2)
Culture: NO GROWTH
Special Requests: ADEQUATE

## 2019-08-11 ENCOUNTER — Telehealth: Payer: Self-pay | Admitting: Orthopedic Surgery

## 2019-08-11 ENCOUNTER — Encounter: Payer: Self-pay | Admitting: Family

## 2019-08-11 NOTE — Telephone Encounter (Signed)
[name] from [name] called to let Dr. Sharol Given know that the patient did not have the wound vac on when she arrived for the patient's visit.  The niece stated that she did not know how to change the batteries.  Thank you.

## 2019-08-11 NOTE — Telephone Encounter (Signed)
Thank you noted and will notify Dr Sharol Given of this.

## 2019-08-13 ENCOUNTER — Encounter: Payer: Self-pay | Admitting: Physician Assistant

## 2019-08-13 ENCOUNTER — Other Ambulatory Visit: Payer: Self-pay

## 2019-08-13 ENCOUNTER — Ambulatory Visit (INDEPENDENT_AMBULATORY_CARE_PROVIDER_SITE_OTHER): Payer: Medicare Other | Admitting: Orthopedic Surgery

## 2019-08-13 ENCOUNTER — Inpatient Hospital Stay: Payer: Medicare Other | Admitting: Physician Assistant

## 2019-08-13 VITALS — Ht 66.0 in | Wt 153.2 lb

## 2019-08-13 DIAGNOSIS — Z89612 Acquired absence of left leg above knee: Secondary | ICD-10-CM

## 2019-08-13 MED ORDER — OXYCODONE-ACETAMINOPHEN 5-325 MG PO TABS: 1.0000 | ORAL_TABLET | ORAL | 0 refills | Status: DC | PRN

## 2019-08-14 ENCOUNTER — Ambulatory Visit
Admission: RE | Admit: 2019-08-14 | Discharge: 2019-08-14 | Disposition: A | Discharge status: Home / Self Care | Payer: Medicare Other | Source: Ambulatory Visit | Attending: Nephrology | Admitting: Nephrology

## 2019-08-14 ENCOUNTER — Encounter: Payer: Self-pay | Admitting: Orthopedic Surgery

## 2019-08-14 DIAGNOSIS — N183 Chronic kidney disease, stage 3 unspecified: Secondary | ICD-10-CM

## 2019-08-14 NOTE — Progress Notes (Signed)
Office Visit Note   Patient: [name]           Date of Birth: 1951-08-11           MRN: [id number] Visit Date: 08/13/2019              Requested by: [name], MD [place],  [place] [address] PCP: [name], MD  Chief Complaint  Patient presents with  . Left Leg - Routine Post Op    07/29/2019 LAKA      HPI: Patient is a 68 year old gentleman who presents 2 weeks status post left above-knee amputation.  The wound VAC is off.  Patient denies any problems states he is out of pain medicine.  Assessment & Plan: Visit Diagnoses:  1. Hx of AKA (above knee amputation), left ([place])     Plan: A prescription was written for Percocet recommended using Colace 100 mg twice a day for constipation.  Follow-up to remove the staples.  Patient is given a prescription for biotech for transfer leg prosthesis.  Follow-Up Instructions: Return in about 1 week (around 08/20/2019).   Ortho Exam  Patient is alert, oriented, no adenopathy, well-dressed, normal affect, normal respiratory effort. Examination the incision is well approximated there is no redness no cellulitis no drainage no signs of infection.  Patient is a bilateral above-the-knee amputee.  Imaging: No results found. No images are attached to the encounter.  Labs: Lab Results  Component Value Date   HGBA1C 6.4 (H) 03/15/2019   HGBA1C (H) 11/24/2009    6.2 (NOTE) The ADA recommends the following therapeutic goal for glycemic control related to Hgb A1c measurement: Goal of therapy: <6.5 Hgb A1c  Reference: American Diabetes Association: Clinical Practice Recommendations 2010, Diabetes Care, 2010, 33: (Suppl  1).   HGBA1C  08/22/2009    5.7 (NOTE) The ADA recommends the following therapeutic goal for glycemic control related to Hgb A1c measurement: Goal of therapy: <6.5 Hgb A1c  Reference: American Diabetes Association: Clinical Practice Recommendations 2010, Diabetes Care, 2010, 33:  (Suppl  1).   ESRSEDRATE 43 (H) 03/14/2019   CRP <0.8 03/14/2019   REPTSTATUS 08/09/2019 FINAL 08/04/2019   GRAMSTAIN  07/25/2019    RARE WBC PRESENT, PREDOMINANTLY PMN MODERATE GRAM POSITIVE RODS FEW GRAM POSITIVE COCCI FEW GRAM NEGATIVE RODS Performed at [facility], [place] [address], [place], [place] [address]    CULT  08/04/2019    NO GROWTH 5 DAYS Performed at [facility] [address] [place], [place] [address]      Lab Results  Component Value Date   ALBUMIN 1.9 (L) 07/30/2019   ALBUMIN 2.3 (L) 07/26/2019   ALBUMIN 2.6 (L) 07/25/2019    Lab Results  Component Value Date   MG 2.0 03/20/2019   MG 1.6 (L) 03/19/2019   MG 1.7 03/18/2019   No results found for: VD25OH  No results found for: PREALBUMIN CBC EXTENDED Latest Ref Rng & Units 08/03/2019 08/02/2019 08/01/2019  WBC 4.0 - 10.5 K/uL 9.7 11.4(H) 10.6(H)  RBC 4.22 - 5.81 MIL/uL 3.68(L) 3.84(L) 3.67(L)  HGB 13.0 - 17.0 g/dL 9.3(L) 9.8(L) 9.2(L)  HCT 39.0 - 52.0 % 27.5(L) 29.5(L) 28.3(L)  PLT 150 - 400 K/uL 343 319 305  NEUTROABS 1.7 - 7.7 K/uL 5.6 8.0(H) -  LYMPHSABS 0.7 - 4.0 K/uL 2.7 2.2 -     Body mass index is 24.73 kg/m.  Orders:  No orders of the defined types were placed in this  encounter.  Meds ordered this encounter  Medications  . oxyCODONE-acetaminophen (PERCOCET/ROXICET) 5-325 MG tablet    Sig: Take 1 tablet by mouth every 4 (four) hours as needed for severe pain.    Dispense:  30 tablet    Refill:  0     Procedures: No procedures performed  Clinical Data: No additional findings.  ROS:  All other systems negative, except as noted in the HPI. Review of Systems  Objective: Vital Signs: Ht 5\' 6"  (1.676 m)   Wt 153 lb 3.5 oz (69.5 kg)   BMI 24.73 kg/m   Specialty Comments:  No specialty comments available.  PMFS History: Patient Active Problem List   Diagnosis Date Noted  . Knee contracture, left   . Malnutrition of moderate degree 07/27/2019  . PVD  (peripheral vascular disease) ([place])   . Osteomyelitis ([place]) 07/25/2019  . Protein-calorie malnutrition, severe ([place]) 07/25/2019  . Foot ulcer ([place]) 05/08/2019  . Acute osteomyelitis of metatarsal bone of right foot ([place]) 04/27/2019  . Critical lower limb ischemia 04/25/2019  . Pressure injury of skin 03/20/2019  . Osteomyelitis of right foot ([place]) 03/15/2019  . Wound of right foot 03/14/2019  . Coronary artery disease   . Hyperlipidemia   . Hypertension   . Paroxysmal atrial fibrillation (HCC)   . CKD (chronic kidney disease), stage IV ([place])   . Dementia ([place]) 02/10/2019  . Asymptomatic stenosis of left carotid artery 12/28/2014  . Carotid stenosis 12/10/2014  . HLD (hyperlipidemia) 12/10/2014  . Left leg swelling 10/22/2014  . PAD (peripheral artery disease) ([place]) 10/22/2014  . Pain of left leg 10/22/2014  . Sensation of cold in leg-left 10/22/2014  . Paroxysmal supraventricular tachycardia ([place]) 09/28/2014  . Lumbosacral radiculopathy at L5   . Foot drop, left 09/27/2014  . Polysubstance abuse ([place]) 09/27/2014  . Acute encephalopathy 06/30/2012  . Cocaine use 06/29/2012  . Syncope 06/29/2012  . Essential hypertension 11/22/2009  . CAD (coronary artery disease) 11/22/2009  . Peripheral vascular disease ([place]) 11/22/2009   Past Medical History:  Diagnosis Date  . Alcoholism ([place])   . Blind left eye ~ 2009  . Carotid artery occlusion   . CHF (congestive heart failure) ([place])   . Complication of anesthesia   . Coronary artery disease   . CVA (cerebral infarction)   . Dementia ([place])   . DVT (deep venous thrombosis) ([place])   . High cholesterol   . Hx of cardiovascular stress test    Lexiscan Myoview 4/16: Anterolateral infarct with very mild peri-infarct ischemia, EF 42%; intermediate risk >>medical therapy recommended  . Hyperlipidemia   . Hypertension   . Myocardial infarction ([place]) ~ 2011  . PAD (peripheral artery disease) (HCC)    non-viable tissue RLE  . Paroxysmal  atrial fibrillation (HCC)   . Peripheral vascular disease ([place])   . Pre-diabetes   . Shortness of breath dyspnea    occ  . Stroke [facility]) ~ 2009   "left eye; totally blind there"   . Stroke [facility])     Family History  Problem Relation Age of Onset  . Lung cancer Sister   . Heart disease Brother   . Diabetes Sister   . Clotting disorder Sister   . Heart disease Sister   . Colon cancer Neg Hx   . Esophageal cancer Neg Hx   . Diabetes Sister   . Heart disease Sister     Past Surgical History:  Procedure Laterality Date  . ABDOMINAL AORTOGRAM W/LOWER EXTREMITY Bilateral 03/20/2019  Procedure: ABDOMINAL AORTOGRAM W/LOWER EXTREMITY;  Surgeon: Serafina Mitchell, MD;  Location: [facility];  Service: Cardiovascular;  Laterality: Bilateral;  . AMPUTATION Right 05/08/2019   Procedure: AMPUTATION ABOVE KNEE RIGHT;  Surgeon: Serafina Mitchell, MD;  Location: [facility];  Service: Vascular;  Laterality: Right;  . AMPUTATION Left 07/29/2019   Procedure: LEFT ABOVE KNEE AMPUTATION;  Surgeon: Newt Minion, MD;  Location: [place];  Service: Orthopedics;  Laterality: Left;  . APPENDECTOMY    . BACK SURGERY     1998-99 had disc removed from back  . CARDIAC CATHETERIZATION    . CAROTID ENDARTERECTOMY Left 12/28/2014  . CORONARY ARTERY BYPASS GRAFT  ?2011  . ENDARTERECTOMY Left 12/28/2014   Procedure: LEFT CAROTID ENDARTERECTOMY WITH PATCH ANGIOPLASTY;  Surgeon: Elam Dutch, MD;  Location: [facility];  Service: Vascular;  Laterality: Left;  . heart bypass    . PERIPHERAL ARTERIAL STENT GRAFT  2000's   BLE   Social History   Occupational History  . Occupation: Retired  Tobacco Use  . Smoking status: Former Smoker    Packs/day: 0.12    Years: 50.00    Pack years: 6.00    Types: Cigarettes    Start date: 10/27/2014    Quit date: 2019    Years since quitting: 1.9  . Smokeless tobacco: Never Used  Substance and Sexual Activity  . Alcohol use: Not Currently    Comment: used to drink constantly;  now maybe 1/2 beer qod"  . Drug use: Never    Types: "Crack" cocaine    Comment: "stopped ~ 7-8 yrs ago yr ago"  . Sexual activity: Not Currently

## 2019-08-21 ENCOUNTER — Encounter: Payer: Self-pay | Admitting: Physician Assistant

## 2019-08-21 ENCOUNTER — Ambulatory Visit (INDEPENDENT_AMBULATORY_CARE_PROVIDER_SITE_OTHER): Payer: Medicare Other | Admitting: Physician Assistant

## 2019-08-21 ENCOUNTER — Other Ambulatory Visit: Payer: Self-pay

## 2019-08-21 VITALS — Ht 66.0 in | Wt 153.0 lb

## 2019-08-21 DIAGNOSIS — Z89612 Acquired absence of left leg above knee: Secondary | ICD-10-CM

## 2019-08-21 NOTE — Progress Notes (Signed)
Office Visit Note   Patient: [name]           Date of Birth: Mar 23, 1951           MRN: [id number] Visit Date: 08/21/2019              Requested by: [name], MD [place],  [place] [address] PCP: [name], MD  Chief Complaint  Patient presents with  . Left Leg - Routine Post Op    07/29/19 left AKA       HPI: This is a pleasant gentleman who is 3-1/2 weeks status post left above-knee amputation.  He is doing well and is without complaints  Assessment & Plan: Visit Diagnoses: No diagnosis found.  Plan: I have given him a prescription to get a shrinker.  They will also visit with [employer] with regards to prostheses follow-up in 2 weeks surgical staples were removed today  Follow-Up Instructions: No follow-ups on file.   Ortho Exam  Patient is alert, oriented, no adenopathy, well-dressed, normal affect, normal respiratory effort. Left above-knee amputation stump minimal soft tissue swelling incision has healed there is no drainage or eschar surgical staples in place which were removed today  Imaging: No results found. No images are attached to the encounter.  Labs: Lab Results  Component Value Date   HGBA1C 6.4 (H) 03/15/2019   HGBA1C (H) 11/24/2009    6.2 (NOTE) The ADA recommends the following therapeutic goal for glycemic control related to Hgb A1c measurement: Goal of therapy: <6.5 Hgb A1c  Reference: American Diabetes Association: Clinical Practice Recommendations 2010, Diabetes Care, 2010, 33: (Suppl  1).   HGBA1C  08/22/2009    5.7 (NOTE) The ADA recommends the following therapeutic goal for glycemic control related to Hgb A1c measurement: Goal of therapy: <6.5 Hgb A1c  Reference: American Diabetes Association: Clinical Practice Recommendations 2010, Diabetes Care, 2010, 33: (Suppl  1).   ESRSEDRATE 43 (H) 03/14/2019   CRP <0.8 03/14/2019   REPTSTATUS 08/09/2019 FINAL 08/04/2019   GRAMSTAIN  07/25/2019    RARE WBC  PRESENT, PREDOMINANTLY PMN MODERATE GRAM POSITIVE RODS FEW GRAM POSITIVE COCCI FEW GRAM NEGATIVE RODS Performed at [facility], [place] [address], [place], [place] [address]    CULT  08/04/2019    NO GROWTH 5 DAYS Performed at [facility] [address] [place], [place] [address]      Lab Results  Component Value Date   ALBUMIN 1.9 (L) 07/30/2019   ALBUMIN 2.3 (L) 07/26/2019   ALBUMIN 2.6 (L) 07/25/2019    Lab Results  Component Value Date   MG 2.0 03/20/2019   MG 1.6 (L) 03/19/2019   MG 1.7 03/18/2019   No results found for: VD25OH  No results found for: PREALBUMIN CBC EXTENDED Latest Ref Rng & Units 08/03/2019 08/02/2019 08/01/2019  WBC 4.0 - 10.5 K/uL 9.7 11.4(H) 10.6(H)  RBC 4.22 - 5.81 MIL/uL 3.68(L) 3.84(L) 3.67(L)  HGB 13.0 - 17.0 g/dL 9.3(L) 9.8(L) 9.2(L)  HCT 39.0 - 52.0 % 27.5(L) 29.5(L) 28.3(L)  PLT 150 - 400 K/uL 343 319 305  NEUTROABS 1.7 - 7.7 K/uL 5.6 8.0(H) -  LYMPHSABS 0.7 - 4.0 K/uL 2.7 2.2 -     Body mass index is 24.69 kg/m.  Orders:  No orders of the defined types were placed in this encounter.  No orders of the defined types were placed in this encounter.    Procedures: No procedures performed  Clinical Data: No  additional findings.  ROS:  All other systems negative, except as noted in the HPI. Review of Systems  Objective: Vital Signs: Ht 5\' 6"  (1.676 m)   Wt 153 lb (69.4 kg)   BMI 24.69 kg/m   Specialty Comments:  No specialty comments available.  PMFS History: Patient Active Problem List   Diagnosis Date Noted  . Knee contracture, left   . Malnutrition of moderate degree 07/27/2019  . PVD (peripheral vascular disease) ([place])   . Osteomyelitis ([place]) 07/25/2019  . Protein-calorie malnutrition, severe ([place]) 07/25/2019  . Foot ulcer ([place]) 05/08/2019  . Acute osteomyelitis of metatarsal bone of right foot ([place]) 04/27/2019  . Critical lower limb ischemia 04/25/2019  . Pressure injury of skin 03/20/2019  .  Osteomyelitis of right foot ([place]) 03/15/2019  . Wound of right foot 03/14/2019  . Coronary artery disease   . Hyperlipidemia   . Hypertension   . Paroxysmal atrial fibrillation (HCC)   . CKD (chronic kidney disease), stage IV ([place])   . Dementia ([place]) 02/10/2019  . Asymptomatic stenosis of left carotid artery 12/28/2014  . Carotid stenosis 12/10/2014  . HLD (hyperlipidemia) 12/10/2014  . Left leg swelling 10/22/2014  . PAD (peripheral artery disease) ([place]) 10/22/2014  . Pain of left leg 10/22/2014  . Sensation of cold in leg-left 10/22/2014  . Paroxysmal supraventricular tachycardia ([place]) 09/28/2014  . Lumbosacral radiculopathy at L5   . Foot drop, left 09/27/2014  . Polysubstance abuse ([place]) 09/27/2014  . Acute encephalopathy 06/30/2012  . Cocaine use 06/29/2012  . Syncope 06/29/2012  . Essential hypertension 11/22/2009  . CAD (coronary artery disease) 11/22/2009  . Peripheral vascular disease ([place]) 11/22/2009   Past Medical History:  Diagnosis Date  . Alcoholism ([place])   . Blind left eye ~ 2009  . Carotid artery occlusion   . CHF (congestive heart failure) ([place])   . Complication of anesthesia   . Coronary artery disease   . CVA (cerebral infarction)   . Dementia ([place])   . DVT (deep venous thrombosis) ([place])   . High cholesterol   . Hx of cardiovascular stress test    Lexiscan Myoview 4/16: Anterolateral infarct with very mild peri-infarct ischemia, EF 42%; intermediate risk >>medical therapy recommended  . Hyperlipidemia   . Hypertension   . Myocardial infarction ([place]) ~ 2011  . PAD (peripheral artery disease) (HCC)    non-viable tissue RLE  . Paroxysmal atrial fibrillation (HCC)   . Peripheral vascular disease ([place])   . Pre-diabetes   . Shortness of breath dyspnea    occ  . Stroke [facility]) ~ 2009   "left eye; totally blind there"   . Stroke [facility])     Family History  Problem Relation Age of Onset  . Lung cancer Sister   . Heart disease Brother   . Diabetes Sister    . Clotting disorder Sister   . Heart disease Sister   . Colon cancer Neg Hx   . Esophageal cancer Neg Hx   . Diabetes Sister   . Heart disease Sister     Past Surgical History:  Procedure Laterality Date  . ABDOMINAL AORTOGRAM W/LOWER EXTREMITY Bilateral 03/20/2019   Procedure: ABDOMINAL AORTOGRAM W/LOWER EXTREMITY;  Surgeon: Serafina Mitchell, MD;  Location: [facility];  Service: Cardiovascular;  Laterality: Bilateral;  . AMPUTATION Right 05/08/2019   Procedure: AMPUTATION ABOVE KNEE RIGHT;  Surgeon: Serafina Mitchell, MD;  Location: [facility];  Service: Vascular;  Laterality: Right;  . AMPUTATION Left 07/29/2019  Procedure: LEFT ABOVE KNEE AMPUTATION;  Surgeon: Newt Minion, MD;  Location: [facility];  Service: Orthopedics;  Laterality: Left;  . APPENDECTOMY    . BACK SURGERY     1998-99 had disc removed from back  . CARDIAC CATHETERIZATION    . CAROTID ENDARTERECTOMY Left 12/28/2014  . CORONARY ARTERY BYPASS GRAFT  ?2011  . ENDARTERECTOMY Left 12/28/2014   Procedure: LEFT CAROTID ENDARTERECTOMY WITH PATCH ANGIOPLASTY;  Surgeon: Elam Dutch, MD;  Location: [facility];  Service: Vascular;  Laterality: Left;  . heart bypass    . PERIPHERAL ARTERIAL STENT GRAFT  2000's   BLE   Social History   Occupational History  . Occupation: Retired  Tobacco Use  . Smoking status: Former Smoker    Packs/day: 0.12    Years: 50.00    Pack years: 6.00    Types: Cigarettes    Start date: 10/27/2014    Quit date: 2019    Years since quitting: 1.9  . Smokeless tobacco: Never Used  Substance and Sexual Activity  . Alcohol use: Not Currently    Comment: used to drink constantly; now maybe 1/2 beer qod"  . Drug use: Never    Types: "Crack" cocaine    Comment: "stopped ~ 7-8 yrs ago yr ago"  . Sexual activity: Not Currently

## 2019-09-22 ENCOUNTER — Ambulatory Visit: Payer: Medicare Other | Admitting: Neurology

## 2019-10-12 ENCOUNTER — Ambulatory Visit: Payer: Medicare Other | Admitting: Physical Therapy

## 2019-10-29 ENCOUNTER — Other Ambulatory Visit: Payer: Self-pay

## 2019-10-29 ENCOUNTER — Telehealth (INDEPENDENT_AMBULATORY_CARE_PROVIDER_SITE_OTHER): Payer: Medicare Other | Admitting: Neurology

## 2019-10-29 DIAGNOSIS — F0151 Vascular dementia with behavioral disturbance: Secondary | ICD-10-CM

## 2019-10-29 DIAGNOSIS — F01518 Vascular dementia, unspecified severity, with other behavioral disturbance: Secondary | ICD-10-CM

## 2019-10-29 NOTE — Progress Notes (Signed)
Telephone (Audio) Visit The purpose of this telephone visit is to provide medical care while limiting exposure to the novel coronavirus.    Consent was obtained from [facility] for telephone visit:  Yes.   Answered questions that patient had about telehealth interaction:  Yes.   I discussed the limitations, risks, security and privacy concerns of performing an evaluation and management service by telephone. I also discussed with the patient that there may be a patient responsible charge related to this service. The patient expressed understanding and agreed to proceed.  Pt location: Home Physician Location: office Name of referring provider:  [name], MD I connected with .[name] at patients initiation/request on 10/29/2019 at  9:00 AM EST by telephone and verified that I am speaking with the correct person using two identifiers.  Pt MRN:  [id number] Pt DOB:  1951/03/29   History of Present Illness:  The patient had a telephone visit on 10/29/2019. His niece [name] is present to provide information. He was last seen in the neurology clinic 8 months ago for moderate dementia with behavioral disturbance. MMSE 10/30 in June 2020. Since his last visit, she reports he is overall stable. Memory comes and goes, he is in pretty good spirits. No paranoia or hallucinations. Sleep is good. He wears adult diapers. He needs more help with dressing and bathing. He denies any headaches, dizziness, no falls.    History on Initial Assessment 06/03/2018: This is a pleasant 69 year old right-handed man with a history of hypertension, hyperlipidemia, CAD s/p MI, paroxysmal atrial fibrillation, history of substance abuse and alcoholism, presenting for evaluation of dementia. The patient is a poor historian and mostly laughs inappropriately during the visit. He is able to answer simple questions and follow commands, but cannot provide any history. His niece [name] does not know much of his history until he  moved in with her last May 2019. She states that he was living with his long-term girlfriend for 20 years and she took care of him until she got sick. [name] started noticing memory changes for at least 10-15 years. He is not a big talker, but she noticed forgetfulness and needing constant reminders. She has never seen him drive. His girlfriend was managing his medications and finances, which [name] has taken over. [name] reports he had a heart attack in 1993 or 1995 and was drinking alcohol heavily until then. He has had no alcohol since moving in with her. She does not recall any inciting event for memory changes. He needs help with dressing and bathing. No paranoia or hallucinations, he is laughing most of the time. He denies any headaches, dizziness, diplopia, dysarthria, dysphagia, neck/back pain, focal numbness/tingling/weakness, bowel/bladder dysfunction. No anosmia, tremors, no recent falls. His right leg sometimes buckles, he always uses a walker. He denies any significant head injuries. No family history of dementia. Sleep is good. No paranoia or hallucinations.   Diagnostic Data: Head CT without contrast in 06/2018 did not show any acute changes, there was advanced chronic microvascular disease and chronic left parietal lobe and anterior left superior frontal gyrus encephalomalacia, mild diffuse volume loss.    Current Outpatient Medications on File Prior to Visit  Medication Sig Dispense Refill  . amiodarone (PACERONE) 200 MG tablet Take 200 mg by mouth 2 (two) times daily.    [name] apixaban (ELIQUIS) 5 MG TABS tablet Take 5 mg by mouth 2 (two) times daily with a meal.    . aspirin EC 81 MG tablet Take 1 tablet (  81 mg total) by mouth daily. 30 tablet 3  . atorvastatin (LIPITOR) 40 MG tablet Take 40 mg by mouth at bedtime.     . docusate sodium (COLACE) 100 MG capsule Take 1 capsule (100 mg total) by mouth 2 (two) times daily. 10 capsule 0  . folic acid (FOLVITE) 1 MG tablet Take 1 mg by mouth  daily.     . hydrALAZINE (APRESOLINE) 50 MG tablet Take 1 tablet (50 mg total) by mouth 2 (two) times daily. (Patient not taking: Reported on 10/28/2019) 60 tablet 3  . Hydrocortisone (GERHARDT'S BUTT CREAM) CREA Apply 1 application topically daily.    [name] lisinopril (PRINIVIL,ZESTRIL) 10 MG tablet Take 2 tablets (20 mg total) by mouth daily. (Patient not taking: Reported on 10/28/2019) 60 tablet 3  . Multiple Vitamin (MULTIVITAMIN WITH MINERALS) TABS tablet Take 1 tablet by mouth daily. (Patient not taking: Reported on 10/28/2019)    . oxyCODONE (OXY IR/ROXICODONE) 5 MG immediate release tablet Take 1 tablet (5 mg total) by mouth every 6 (six) hours as needed for moderate pain. (Patient not taking: Reported on 10/28/2019) 30 tablet 0  . oxyCODONE-acetaminophen (PERCOCET/ROXICET) 5-325 MG tablet Take 1 tablet by mouth every 4 (four) hours as needed for severe pain. (Patient not taking: Reported on 10/28/2019) 30 tablet 0  . oxyCODONE-acetaminophen (PERCOCET/ROXICET) 5-325 MG tablet Take 1 tablet by mouth every 6 (six) hours as needed for moderate pain. (Patient not taking: Reported on 10/28/2019) 20 tablet 0   No current facility-administered medications on file prior to visit.   Observations/Objective:  Exam limited due to nature of telephone visit. Patient is awake, able to answer simple questions without dysarthria. Needs multiple choice for orientation questions, choosing month as March and year as 2020. Remote and recent memory impaired.   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 MMSE 10/30 in June 2020. We had previously discussed dementia  medications and low potential benefit at this point, he is not having significant behavioral or sleep issues, continue 24/7 supervision. He does not drive. Follow-up in 6 months, he knows to call for any changes.   Follow Up Instructions:   -I discussed the assessment and treatment plan with the patient. The patient was provided an opportunity to ask questions and all were answered. The patient agreed with the plan and demonstrated an understanding of the instructions.   The patient was advised to call back or seek an in-person evaluation if the symptoms worsen or if the condition fails to improve as anticipated.    Total Time spent in visit with the patient was:  9:47 minutes, of which 100% of the time was spent in counseling and/or coordinating care on the above.   Pt understands and agrees with the plan of care outlined.     Cameron Sprang, MD

## 2019-11-04 ENCOUNTER — Ambulatory Visit: Payer: Medicare Other | Admitting: Physical Therapy

## 2019-11-13 ENCOUNTER — Ambulatory Visit: Payer: Medicare Other | Admitting: Physician Assistant

## 2019-11-27 ENCOUNTER — Encounter: Payer: Self-pay | Admitting: Gastroenterology

## 2019-11-27 ENCOUNTER — Ambulatory Visit (INDEPENDENT_AMBULATORY_CARE_PROVIDER_SITE_OTHER): Payer: Medicare Other | Admitting: Gastroenterology

## 2019-11-27 VITALS — BP 134/74 | HR 64 | Temp 98.7°F

## 2019-11-27 DIAGNOSIS — K625 Hemorrhage of anus and rectum: Secondary | ICD-10-CM | POA: Diagnosis not present

## 2019-11-27 DIAGNOSIS — D638 Anemia in other chronic diseases classified elsewhere: Secondary | ICD-10-CM

## 2019-11-27 DIAGNOSIS — R195 Other fecal abnormalities: Secondary | ICD-10-CM

## 2019-11-27 NOTE — Progress Notes (Signed)
11/27/2019 [name] [id number] 1951/08/18   HISTORY OF PRESENT ILLNESS: This is a 69 year old male who is a patient of Dr. Tarri Glenn.  Previously seen in 07/2018 to discuss colonoscopy, but this was never performed.  His niece cares for him and she says that she could never work it out with her work schedule to be there to help him prep.  She says that it will be too much of a mess and she works second shift making it difficult and inconvenient.  Says that she cannot miss work.  She is here today again with him to rediscuss under the same circumstances, but this time there are reports of rectal bleeding.  Also PCP reports that he had a Cologuard study that was positive.  She is not great with detail, but does report that he has red blood in his stools at times.  Patient does not provide any information.  He is anemic with low MCV, although ferritin is elevated and other iron studies are normal, likely anemia of chronic disease.  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   Past Medical History:  Diagnosis Date  . Alcoholism ([place])   . Blind left eye ~ 2009  . Carotid artery occlusion   . CHF (congestive heart failure) ([place])   . Complication of anesthesia   . Coronary artery disease   . CVA (cerebral infarction)   . Dementia ([place])   . DVT (deep venous thrombosis) ([place])   . High cholesterol   . Hx of cardiovascular stress test    Lexiscan Myoview 4/16: Anterolateral infarct with very mild peri-infarct ischemia, EF 42%; intermediate risk >>medical therapy recommended  . Hyperlipidemia   . Hypertension   . Myocardial infarction ([place]) ~ 2011  . PAD (peripheral artery disease) (HCC)    non-viable tissue  RLE  . Paroxysmal atrial fibrillation (HCC)   . Peripheral vascular disease ([place])   . Pre-diabetes   . S/P AKA (above knee amputation) bilateral ([place])   . Shortness of breath dyspnea    occ  . Stroke [facility]) ~ 2009   "left eye; totally blind there"    Past Surgical History:  Procedure Laterality Date  . ABDOMINAL AORTOGRAM W/LOWER EXTREMITY Bilateral 03/20/2019   Procedure: ABDOMINAL AORTOGRAM W/LOWER EXTREMITY;  Surgeon: Serafina Mitchell, MD;  Location: [place] [facility];  Service: Cardiovascular;  Laterality: Bilateral;  . AMPUTATION Right 05/08/2019   Procedure: AMPUTATION ABOVE KNEE RIGHT;  Surgeon: Serafina Mitchell, MD;  Location: [place];  Service: Vascular;  Laterality: Right;  . AMPUTATION Left 07/29/2019   Procedure: LEFT ABOVE KNEE AMPUTATION;  Surgeon: Newt Minion, MD;  Location: [place];  Service: Orthopedics;  Laterality: Left;  . APPENDECTOMY    . BACK SURGERY     1998-99 had disc removed from back  . CARDIAC CATHETERIZATION    . CAROTID ENDARTERECTOMY Left 12/28/2014  . CORONARY ARTERY BYPASS GRAFT  ?2011  . ENDARTERECTOMY Left 12/28/2014   Procedure: LEFT CAROTID ENDARTERECTOMY WITH PATCH ANGIOPLASTY;  Surgeon: Elam Dutch, MD;  Location: [place];  Service: Vascular;  Laterality: Left;  . heart bypass    . PERIPHERAL ARTERIAL STENT GRAFT  2000's   BLE    reports that he quit smoking about 2 years  ago. His smoking use included cigarettes. He started smoking about 5 years ago. He has a 6.00 pack-year smoking history. He has never used smokeless tobacco. He reports previous alcohol use. He reports that he does not use drugs. family history includes Clotting disorder in his sister; Diabetes in his sister and sister; Heart disease in his brother, sister, and sister; Lung cancer in his sister. No Known Allergies    Outpatient Encounter Medications as of 11/27/2019  Medication Sig  . amiodarone (PACERONE) 200 MG tablet Take 200 mg by mouth 2 (two) times daily.  [name] apixaban  (ELIQUIS) 5 MG TABS tablet Take 5 mg by mouth 2 (two) times daily with a meal.  . aspirin EC 81 MG tablet Take 1 tablet (81 mg total) by mouth daily.  [name] atorvastatin (LIPITOR) 40 MG tablet Take 40 mg by mouth at bedtime.   . docusate sodium (COLACE) 100 MG capsule Take 1 capsule (100 mg total) by mouth 2 (two) times daily.  . folic acid (FOLVITE) 1 MG tablet Take 1 mg by mouth daily.   . [DISCONTINUED] hydrALAZINE (APRESOLINE) 50 MG tablet Take 1 tablet (50 mg total) by mouth 2 (two) times daily. (Patient not taking: Reported on 10/28/2019)  . [DISCONTINUED] Hydrocortisone (GERHARDT'S BUTT CREAM) CREA Apply 1 application topically daily.  . [DISCONTINUED] lisinopril (PRINIVIL,ZESTRIL) 10 MG tablet Take 2 tablets (20 mg total) by mouth daily. (Patient not taking: Reported on 10/28/2019)  . [DISCONTINUED] Multiple Vitamin (MULTIVITAMIN WITH MINERALS) TABS tablet Take 1 tablet by mouth daily. (Patient not taking: Reported on 10/28/2019)  . [DISCONTINUED] oxyCODONE (OXY IR/ROXICODONE) 5 MG immediate release tablet Take 1 tablet (5 mg total) by mouth every 6 (six) hours as needed for moderate pain. (Patient not taking: Reported on 10/28/2019)  . [DISCONTINUED] oxyCODONE-acetaminophen (PERCOCET/ROXICET) 5-325 MG tablet Take 1 tablet by mouth every 4 (four) hours as needed for severe pain. (Patient not taking: Reported on 10/28/2019)  . [DISCONTINUED] oxyCODONE-acetaminophen (PERCOCET/ROXICET) 5-325 MG tablet Take 1 tablet by mouth every 6 (six) hours as needed for moderate pain. (Patient not taking: Reported on 10/28/2019)   No facility-administered encounter medications on file as of 11/27/2019.     REVIEW OF SYSTEMS  : All other systems reviewed and negative except where noted in the History of Present Illness.   PHYSICAL EXAM: BP 134/74 (BP Location: Left Arm, Patient Position: Sitting, Cuff Size: Normal)   Pulse 64   Temp 98.7 F (37.1 C)  General: Chronically ill-appearing male in no acute distress;  in wheelchair. Head: Normocephalic and atraumatic Eyes:  Sclerae anicteric, conjunctiva pink. Ears: Normal auditory acuity Lungs: Clear throughout to auscultation; no increased WOB. Heart: Regular rate and rhythm; no M/R/G. Abdomen: Soft, non-distended.  BS present.  Non-tender. Musculoskeletal: Symmetrical with no gross deformities  Skin: No lesions on visible extremities Rectal:  Not examined today. Extremities: No edema  Neurological: Alert oriented x 4, grossly non-focal Psychological:  Alert and cooperative. Normal mood and affect  ASSESSMENT AND PLAN: *69 year old male with multiple medical problems here for rectal bleeding and positive Cologuard study to discuss colonoscopy.  Niece expresses her inability to be home with him to help him prep for colonoscopy, etc.  She says that this is the issue in the past as well.  I advised that I would discuss with Dr. Tarri Glenn as really the only other option would be if she is willing to have him admitted to the hospital and have this performed as an inpatient.  Will be back in contact  with them after discussing with Dr. Tarri Glenn. *Anemia:  MCV low, but ferritin elevated and other iron studies normal.  Likely AOCD. *Chronic anticoagulation with Eliquis due to DVT and atrial fibrillation   CC:  [name], MD

## 2019-11-27 NOTE — Patient Instructions (Signed)
If you are age 68 or older, your body mass index should be between 23-30. Your There is no height or weight on file to calculate BMI. If this is out of the aforementioned range listed, please consider follow up with your Primary Care Provider.  If you are age 12 or younger, your body mass index should be between 19-25. Your There is no height or weight on file to calculate BMI. If this is out of the aformentioned range listed, please consider follow up with your Primary Care Provider.   We will contact you regarding scheduling your colonoscopy once we discuss this with Dr Tarri Glenn.  Due to recent changes in healthcare laws, you may see the results of your imaging and laboratory studies on MyChart before your provider has had a chance to review them.  We understand that in some cases there may be results that are confusing or concerning to you. Not all laboratory results come back in the same time frame and the provider may be waiting for multiple results in order to interpret others.  Please give [place] 48 hours in order for your provider to thoroughly review all the results before contacting the office for clarification of your results.

## 2019-12-08 ENCOUNTER — Encounter: Payer: Self-pay | Admitting: Gastroenterology

## 2019-12-08 DIAGNOSIS — R195 Other fecal abnormalities: Secondary | ICD-10-CM | POA: Insufficient documentation

## 2019-12-08 DIAGNOSIS — K625 Hemorrhage of anus and rectum: Secondary | ICD-10-CM | POA: Insufficient documentation

## 2019-12-08 DIAGNOSIS — D638 Anemia in other chronic diseases classified elsewhere: Secondary | ICD-10-CM | POA: Insufficient documentation

## 2019-12-08 NOTE — Progress Notes (Signed)
Reviewed and agree with management plans. Will plan pre-procedure hospitalization for bowel prep with anticipated discharge following endoscopic evaluation.   Deona Novitski L. Tarri Glenn, MD, MPH

## 2019-12-09 ENCOUNTER — Telehealth: Payer: Self-pay | Admitting: Physical Therapy

## 2019-12-09 ENCOUNTER — Other Ambulatory Visit: Payer: Self-pay

## 2019-12-09 ENCOUNTER — Ambulatory Visit: Payer: Medicare Other | Attending: Orthopedic Surgery | Admitting: Physical Therapy

## 2019-12-09 ENCOUNTER — Encounter: Payer: Self-pay | Admitting: Physical Therapy

## 2019-12-09 DIAGNOSIS — R531 Weakness: Secondary | ICD-10-CM

## 2019-12-09 DIAGNOSIS — M25652 Stiffness of left hip, not elsewhere classified: Secondary | ICD-10-CM | POA: Diagnosis present

## 2019-12-09 NOTE — Telephone Encounter (Signed)
[name] was referred to PT for evaluation with bilateral AKAs by Meridee Score, MD. He does not appear to be a candidate for prostheses due to weakness, dementia, vision & transfer dependency. His medical power of care is his niece, [name].  She would like to have him evaluated for a power w/c which appears appropriate to assess. Can you please write a prescription for a PT power wheelchair evaluation and send in Epic to [facility] with physical therapy or FAX to [phone]?  Thank you Jamey Reas, PT, DPT PT Specializing in [place] 12/09/2019@ 2:43 PM Phone:  [phone]  Fax:  [phone] [place] [address] [place], [place] [address]

## 2019-12-09 NOTE — Therapy (Signed)
[facility] [address] [place], [place], [address] Phone: [phone]   Fax:  [phone]  Physical Therapy Evaluation  Patient Details  Name: [name] MRN: [id number] Date of Birth: 1950/12/03 Referring Provider (PT): Meridee Score, MD   Encounter Date: 12/09/2019  PT End of Session - 12/09/19 1222    Visit Number  1    Number of Visits  1    Authorization Type  [employer]HC Medicare & Medicaid    PT Start Time  1100    PT Stop Time  1220    PT Time Calculation (min)  80 min    Equipment Utilized During Treatment  Gait belt    Activity Tolerance  Patient tolerated treatment well    Behavior During Therapy  [facility] for tasks assessed/performed;Flat affect       Past Medical History:  Diagnosis Date  . Alcoholism ([place])   . Blind left eye ~ 2009  . Carotid artery occlusion   . CHF (congestive heart failure) ([place])   . Complication of anesthesia   . Coronary artery disease   . CVA (cerebral infarction)   . Dementia ([place])   . DVT (deep venous thrombosis) ([place])   . High cholesterol   . Hx of cardiovascular stress test    Lexiscan Myoview 4/16: Anterolateral infarct with very mild peri-infarct ischemia, EF 42%; intermediate risk >>medical therapy recommended  . Hyperlipidemia   . Hypertension   . Myocardial infarction ([place]) ~ 2011  . PAD (peripheral artery disease) (HCC)    non-viable tissue RLE  . Paroxysmal atrial fibrillation (HCC)   . Peripheral vascular disease ([place])   . Pre-diabetes   . S/P AKA (above knee amputation) bilateral ([place])   . Shortness of breath dyspnea    occ  . Stroke [facility]) ~ 2009   "left eye; totally blind there"     Past Surgical History:  Procedure Laterality Date  . ABDOMINAL AORTOGRAM W/LOWER EXTREMITY Bilateral 03/20/2019   Procedure: ABDOMINAL AORTOGRAM W/LOWER EXTREMITY;  Surgeon: Serafina Mitchell, MD;  Location: [place] [facility];  Service: Cardiovascular;  Laterality: Bilateral;  .  AMPUTATION Right 05/08/2019   Procedure: AMPUTATION ABOVE KNEE RIGHT;  Surgeon: Serafina Mitchell, MD;  Location: [place];  Service: Vascular;  Laterality: Right;  . AMPUTATION Left 07/29/2019   Procedure: LEFT ABOVE KNEE AMPUTATION;  Surgeon: Newt Minion, MD;  Location: [place];  Service: Orthopedics;  Laterality: Left;  . APPENDECTOMY    . BACK SURGERY     1998-99 had disc removed from back  . CARDIAC CATHETERIZATION    . CAROTID ENDARTERECTOMY Left 12/28/2014  . CORONARY ARTERY BYPASS GRAFT  ?2011  . ENDARTERECTOMY Left 12/28/2014   Procedure: LEFT CAROTID ENDARTERECTOMY WITH PATCH ANGIOPLASTY;  Surgeon: Elam Dutch, MD;  Location: [place];  Service: Vascular;  Laterality: Left;  . heart bypass    . PERIPHERAL ARTERIAL STENT GRAFT  2000's   BLE    There were no vitals filed for this visit.   Subjective Assessment - 12/09/19 1150    Subjective  This 69yo male was referred on 09/15/2019 by Meridee Score, MD with L-AKA. He underwent a left Transfemoral Amputation on 07/29/2019 and right Transfemoral Amputation on 05/08/2019. He has history of moderate dementia with behavior disturbance. He has seen [employer] after first amputation & 2nd time who recommended PT evaluation.    Patient is accompained by:  Family member   neice, Kandice Robinsons   Pertinent History  Bil TFAs, dementia w/behavior disturbance, HTN, CAD, MI, A-Fib, hx substance abuse & alcholism, CKD st IV, Lumbosacral radiculopathy,    Patient Stated Goals  to see if he can get prostheses or a power w/c    Currently in Pain?  No/denies      SpO2 99% at rest & with activity. HR 78 rest & 84 with transfers   [facility] PT Assessment - 12/09/19 1145      Assessment   Medical Diagnosis  Bilateral Transfemoral Amputations    Referring Provider (PT)  Meridee Score, MD    Onset Date/Surgical Date  09/15/19   MD referral to PT   Hand Dominance  Right    Prior Therapy  HHPT for 4-6 weeks after 2nd amputation      Precautions   Precautions   Fall      Balance Screen   Has the patient fallen in the past 6 months  No    Has the patient had a decrease in activity level because of a fear of falling?   Yes    Is the patient reluctant to leave their home because of a fear of falling?   No   uses w/c     Home Environment   Living Environment  Private residence    Living Arrangements  Other relatives   his niece & her boyfriend   Available Help at Discharge  Available 24 hours/day    Type of [place] to enter   landlord has not put up ramp   Entrance Stairs-Number of Steps  3    Home Layout  One level    Home Equipment  Bedside commode;Wheelchair - manual;Hospital bed      Prior Function   Level of Independence  Independent with household mobility with device;Independent with community mobility with device   prior to either amputations   Vocation  Retired      Art therapist   Posture/Postural Control  Postural limitations    Postural Limitations  Rounded Shoulders;Forward head;Flexed trunk    Posture Comments  sitting posture      ROM / Strength   AROM / PROM / Strength  PROM;Strength      PROM   Overall PROM   Deficits    Overall PROM Comments  hip extension Thomas supine position right -25* & left -10*      Strength   Overall Strength  Deficits    Overall Strength Comments  BUEs shoulders grossly 3-4/5, MMT elbow ext 5/5 but unable to do dip with hands on w/c wheels.     Right Hip Flexion  2+/5    Right Hip Extension  2-/5    Right Hip ABduction  2-/5    Left Hip Flexion  3-/5    Left Hip Extension  2-/5    Left Hip ABduction  2-/5      Transfers   Transfers  Supine to Sit;Sit to Supine;Anterior-Posterior Training and development officer  3: Mod Medical illustrator Details (indicate cue type and reason)  w/c against mat table same height scooting anterior and posterior back into w/c    Supine to Sit  3: Mod assist    Supine to Sit Details  (indicate cue type and reason)  Pt using hospital bed at home so has not had to raise from supine position.     Sit to Supine  4: Min assist    Sit  to Supine Details   Pt using hospital bed at home so has not had to lower to supine position.       Balance   Balance Assessed  Yes      Static Sitting Balance   Static Sitting - Balance Support  Bilateral upper extremity supported;Feet unsupported   bil AKAs, sitting with limbs on mat table   Static Sitting - Level of Assistance  5: Stand by assistance    Static Sitting - Comment/# of Minutes  1      Dynamic Sitting Balance   Dynamic Sitting - Balance Support  Left upper extremity supported    Dynamic Sitting - Level of Assistance  4: Min assist    Reach (Patient is able to reach ___ inches to right, left, forward, back)  0 / raises arm but unable to reach beyond length    Dynamic Sitting - Balance Activities  Lateral lean/weight shifting;Forward lean/weight shifting    Dynamic Sitting balance - Comments  MinA trunk lean anterior/ right / left with head displacement 2" only      Peripheral Vision: left side unable to see object until ~45* from midline and right side WNL           Objective measurements completed on examination: See above findings.              PT Education - 12/09/19 1225    Education Details  [facility] as option for adult day care.  Power w/c evaluation will need to have patient drive to see if his vision would enable safe ability to avoid objects.    Person(s) Educated  Theatre stage manager)    Methods  Explanation;Verbal cues    Comprehension  Verbalized understanding                  Plan - 12/09/19 2218    Clinical Impression Statement  This 69yo male had bilateral Transfemoral Amputations 5-6 months ago. He was using RW prior to either amputation. He has significant weakness that would impede his ability to use prostheses with bilateral AKAs.  He has vision deficits with blindness in left  eye and dementia that would prohibit safe use also.  His caregiver is his neice who would like to assess a power w/c.  With his vision & dementia he would need to be assessed if he could safely maneuver around household obstales.  Neice is struggling with 24hr care while she is working. [facility] may be a good option & PT recommended that she look closer at pros & cons to their situation.    Personal Factors and Comorbidities  Comorbidity 3+    Comorbidities  Bil TFAs, dementia w/behavior disturbance, HTN, CAD, MI, A-Fib, hx substance abuse & alcholism, CKD st IV, Lumbosacral radiculopathy,    Stability/Clinical Decision Making  Evolving/Moderate complexity    Clinical Decision Making  Low    PT Frequency  One time visit    PT Next Visit Plan  prostheses not appropriate.  Power w/c evaluation including driving a w/c to determine if he can safely avoid obstacles    Consulted and Agree with Plan of Care  Family member/caregiver       Patient will benefit from skilled therapeutic intervention in order to improve the following deficits and impairments:  Decreased activity tolerance, Decreased balance, Decreased endurance, Decreased range of motion, Decreased strength, Postural dysfunction  Visit Diagnosis: Weakness generalized  Stiffness of left hip, not elsewhere classified  Problem List Patient Active Problem List   Diagnosis Date Noted  . Rectal bleeding 12/08/2019  . Heme + stool 12/08/2019  . Anemia of chronic disease 12/08/2019  . Knee contracture, left   . Malnutrition of moderate degree 07/27/2019  . Osteomyelitis ([place]) 07/25/2019  . Protein-calorie malnutrition, severe ([place]) 07/25/2019  . Foot ulcer ([place]) 05/08/2019  . Acute osteomyelitis of metatarsal bone of right foot ([place]) 04/27/2019  . Critical lower limb ischemia 04/25/2019  . Pressure injury of skin 03/20/2019  . Osteomyelitis of right foot ([place]) 03/15/2019  . Wound of right foot 03/14/2019  . Coronary artery  disease   . Hypertension   . Paroxysmal atrial fibrillation (HCC)   . CKD (chronic kidney disease), stage IV ([place])   . Dementia ([place]) 02/10/2019  . Abnormal hearing screen 02/19/2018  . Presbycusis of both ears 02/19/2018  . Asymptomatic stenosis of left carotid artery 12/28/2014  . Carotid stenosis 12/10/2014  . HLD (hyperlipidemia) 12/10/2014  . Left leg swelling 10/22/2014  . PAD (peripheral artery disease) ([place]) 10/22/2014  . Pain of left leg 10/22/2014  . Sensation of cold in leg-left 10/22/2014  . Paroxysmal supraventricular tachycardia ([place]) 09/28/2014  . Lumbosacral radiculopathy at L5   . Foot drop, left 09/27/2014  . Polysubstance abuse ([place]) 09/27/2014  . Acute encephalopathy 06/30/2012  . Cocaine use 06/29/2012  . Syncope 06/29/2012  . Coronary atherosclerosis 03/29/2008  . Peripheral vascular disease ([place]) 03/29/2008  . Other and unspecified hyperlipidemia 03/29/2008  . Peripheral angiopathy in diseases classified elsewhere ([place]) 03/29/2008  . Chest pain, unspecified 03/29/2008  . Essential hypertension 03/26/2008  . Hypertrophy of prostate without urinary obstruction and other lower urinary tract symptoms (LUTS) 03/26/2008    Jamey Reas PT, DPT 12/09/2019, 10:31 PM  [facility] [address] [place], [place], [address] Phone: [phone]   Fax:  [phone]  Name: Stephanos Fan MRN: [id number] Date of Birth: November 06, 1950

## 2020-05-06 ENCOUNTER — Encounter: Payer: Self-pay | Admitting: Family

## 2020-05-06 ENCOUNTER — Ambulatory Visit (INDEPENDENT_AMBULATORY_CARE_PROVIDER_SITE_OTHER): Payer: Medicare Other | Admitting: Family

## 2020-05-06 VITALS — Ht 66.0 in | Wt 153.0 lb

## 2020-05-06 DIAGNOSIS — S78111A Complete traumatic amputation at level between right hip and knee, initial encounter: Secondary | ICD-10-CM

## 2020-05-06 DIAGNOSIS — Z89611 Acquired absence of right leg above knee: Secondary | ICD-10-CM

## 2020-05-06 DIAGNOSIS — Z89612 Acquired absence of left leg above knee: Secondary | ICD-10-CM | POA: Diagnosis not present

## 2020-05-06 NOTE — Progress Notes (Signed)
Office Visit Note   Patient: [name]           Date of Birth: February 17, 1951           MRN: [id number] Visit Date: 05/06/2020              Requested by: [name], MD [address],  [place] [address] PCP: [name], MD  Chief Complaint  Patient presents with  . Left Leg - Follow-up    07/29/19 left AKA      HPI: The patient is a 69 year old gentleman who presents today in follow-up for issues with his left above-knee amputation.  Surgery was November of last year.  He was last seen in our clinic in December.  He had previously had some physical therapy last seen by therapy in April of this year they recommended against prosthesis set up due to his weakness and dementia.  They had recommended at that time a power wheelchair unfortunately this was not completed.  His niece accompanies today she is concerned about a wound that is been ongoing for about a month to the left residual limb.  He does not have any shrinkers and is wheelchair-bound she cannot think of any injuries or pressure activities unfortunately this wound came on and occasionally opens up occasionally sees some clear brown drainage there is not been any surrounding erythema no odor no fever or chills  Assessment & Plan: Visit Diagnoses:  1. Hx of AKA (above knee amputation), left ([place])   2. Above knee amputation of right lower extremity ([place])     Plan: Daily Dial soap cleansing of the wound with antibacterial ointment and a Band-Aid.  We will plan to reevaluate the wound in about 3 weeks.  I will go ahead and order his power wheelchair.  Follow-Up Instructions: Return in about 3 weeks (around 05/27/2020).   Ortho Exam  Patient is alert, oriented, no adenopathy, well-dressed, normal affect, normal respiratory effort. On examination of the left above-knee amputation.  The incision is well-healed unfortunately he does have a significant bony prominence to the distal femur.  There is  a 5 mm in diameter eschar after debriding the eschar there is ulceration that is about 3 mm deep there is no probing no active drainage there is beefy granulation in the wound bed.  The right above-knee amputation well-healed well consolidated no erythema no wounds or pressure areas  Imaging: No results found. No images are attached to the encounter.  Labs: Lab Results  Component Value Date   HGBA1C 6.4 (H) 03/15/2019   HGBA1C (H) 11/24/2009    6.2 (NOTE) The ADA recommends the following therapeutic goal for glycemic control related to Hgb A1c measurement: Goal of therapy: <6.5 Hgb A1c  Reference: American Diabetes Association: Clinical Practice Recommendations 2010, Diabetes Care, 2010, 33: (Suppl  1).   HGBA1C  08/22/2009    5.7 (NOTE) The ADA recommends the following therapeutic goal for glycemic control related to Hgb A1c measurement: Goal of therapy: <6.5 Hgb A1c  Reference: American Diabetes Association: Clinical Practice Recommendations 2010, Diabetes Care, 2010, 33: (Suppl  1).   ESRSEDRATE 43 (H) 03/14/2019   CRP <0.8 03/14/2019   REPTSTATUS 08/09/2019 FINAL 08/04/2019   GRAMSTAIN  07/25/2019    RARE WBC PRESENT, PREDOMINANTLY PMN MODERATE GRAM POSITIVE RODS FEW GRAM POSITIVE COCCI FEW GRAM NEGATIVE RODS Performed at [facility], [place] [address] [place], [place] [address]    CULT  08/04/2019  NO GROWTH 5 DAYS Performed at [facility], [place] [address], [place], [place] [address]      Lab Results  Component Value Date   ALBUMIN 1.9 (L) 07/30/2019   ALBUMIN 2.3 (L) 07/26/2019   ALBUMIN 2.6 (L) 07/25/2019    Lab Results  Component Value Date   MG 2.0 03/20/2019   MG 1.6 (L) 03/19/2019   MG 1.7 03/18/2019   No results found for: VD25OH  No results found for: PREALBUMIN CBC EXTENDED Latest Ref Rng & Units 08/03/2019 08/02/2019 08/01/2019  WBC 4.0 - 10.5 K/uL 9.7 11.4(H) 10.6(H)  RBC 4.22 - 5.81 MIL/uL 3.68(L) 3.84(L) 3.67(L)  HGB 13.0 - 17.0  g/dL 9.3(L) 9.8(L) 9.2(L)  HCT 39 - 52 % 27.5(L) 29.5(L) 28.3(L)  PLT 150 - 400 K/uL 343 319 305  NEUTROABS 1.7 - 7.7 K/uL 5.6 8.0(H) -  LYMPHSABS 0.7 - 4.0 K/uL 2.7 2.2 -     Body mass index is 24.69 kg/m.  Orders:  No orders of the defined types were placed in this encounter.  No orders of the defined types were placed in this encounter.    Procedures: No procedures performed  Clinical Data: No additional findings.  ROS:  All other systems negative, except as noted in the HPI. Review of Systems  Constitutional: Negative for chills and fever.  Skin: Positive for wound. Negative for color change.    Objective: Vital Signs: Ht 5\' 6"  (1.676 m)   Wt 153 lb (69.4 kg)   BMI 24.69 kg/m   Specialty Comments:  No specialty comments available.  PMFS History: Patient Active Problem List   Diagnosis Date Noted  . Rectal bleeding 12/08/2019  . Heme + stool 12/08/2019  . Anemia of chronic disease 12/08/2019  . Knee contracture, left   . Malnutrition of moderate degree 07/27/2019  . Osteomyelitis ([place]) 07/25/2019  . Protein-calorie malnutrition, severe ([place]) 07/25/2019  . Foot ulcer ([place]) 05/08/2019  . Acute osteomyelitis of metatarsal bone of right foot ([place]) 04/27/2019  . Critical lower limb ischemia 04/25/2019  . Pressure injury of skin 03/20/2019  . Osteomyelitis of right foot ([place]) 03/15/2019  . Wound of right foot 03/14/2019  . Coronary artery disease   . Hypertension   . Paroxysmal atrial fibrillation (HCC)   . CKD (chronic kidney disease), stage IV ([place])   . Dementia ([place]) 02/10/2019  . Abnormal hearing screen 02/19/2018  . Presbycusis of both ears 02/19/2018  . Asymptomatic stenosis of left carotid artery 12/28/2014  . Carotid stenosis 12/10/2014  . HLD (hyperlipidemia) 12/10/2014  . Left leg swelling 10/22/2014  . PAD (peripheral artery disease) ([place]) 10/22/2014  . Pain of left leg 10/22/2014  . Sensation of cold in leg-left 10/22/2014  . Paroxysmal  supraventricular tachycardia ([place]) 09/28/2014  . Lumbosacral radiculopathy at L5   . Foot drop, left 09/27/2014  . Polysubstance abuse ([place]) 09/27/2014  . Acute encephalopathy 06/30/2012  . Cocaine use 06/29/2012  . Syncope 06/29/2012  . Coronary atherosclerosis 03/29/2008  . Peripheral vascular disease ([place]) 03/29/2008  . Other and unspecified hyperlipidemia 03/29/2008  . Peripheral angiopathy in diseases classified elsewhere ([place]) 03/29/2008  . Chest pain, unspecified 03/29/2008  . Essential hypertension 03/26/2008  . Hypertrophy of prostate without urinary obstruction and other lower urinary tract symptoms (LUTS) 03/26/2008   Past Medical History:  Diagnosis Date  . Alcoholism ([place])   . Blind left eye ~ 2009  . Carotid artery occlusion   . CHF (congestive heart failure) ([place])   . Complication  of anesthesia   . Coronary artery disease   . CVA (cerebral infarction)   . Dementia ([place])   . DVT (deep venous thrombosis) ([place])   . High cholesterol   . Hx of cardiovascular stress test    Lexiscan Myoview 4/16: Anterolateral infarct with very mild peri-infarct ischemia, EF 42%; intermediate risk >>medical therapy recommended  . Hyperlipidemia   . Hypertension   . Myocardial infarction ([place]) ~ 2011  . PAD (peripheral artery disease) (HCC)    non-viable tissue RLE  . Paroxysmal atrial fibrillation (HCC)   . Peripheral vascular disease ([place])   . Pre-diabetes   . S/P AKA (above knee amputation) bilateral ([place])   . Shortness of breath dyspnea    occ  . Stroke [facility]) ~ 2009   "left eye; totally blind there"     Family History  Problem Relation Age of Onset  . Lung cancer Sister   . Heart disease Brother   . Diabetes Sister   . Clotting disorder Sister   . Heart disease Sister   . Colon cancer Neg Hx   . Esophageal cancer Neg Hx   . Diabetes Sister   . Heart disease Sister     Past Surgical History:  Procedure Laterality Date  . ABDOMINAL AORTOGRAM W/LOWER EXTREMITY  Bilateral 03/20/2019   Procedure: ABDOMINAL AORTOGRAM W/LOWER EXTREMITY;  Surgeon: Serafina Mitchell, MD;  Location: [facility];  Service: Cardiovascular;  Laterality: Bilateral;  . AMPUTATION Right 05/08/2019   Procedure: AMPUTATION ABOVE KNEE RIGHT;  Surgeon: Serafina Mitchell, MD;  Location: [facility];  Service: Vascular;  Laterality: Right;  . AMPUTATION Left 07/29/2019   Procedure: LEFT ABOVE KNEE AMPUTATION;  Surgeon: Newt Minion, MD;  Location: [facility];  Service: Orthopedics;  Laterality: Left;  . APPENDECTOMY    . BACK SURGERY     1998-99 had disc removed from back  . CARDIAC CATHETERIZATION    . CAROTID ENDARTERECTOMY Left 12/28/2014  . CORONARY ARTERY BYPASS GRAFT  ?2011  . ENDARTERECTOMY Left 12/28/2014   Procedure: LEFT CAROTID ENDARTERECTOMY WITH PATCH ANGIOPLASTY;  Surgeon: Elam Dutch, MD;  Location: [facility];  Service: Vascular;  Laterality: Left;  . heart bypass    . PERIPHERAL ARTERIAL STENT GRAFT  2000's   BLE   Social History   Occupational History  . Occupation: Retired  Tobacco Use  . Smoking status: Former Smoker    Packs/day: 0.12    Years: 50.00    Pack years: 6.00    Types: Cigarettes    Start date: 10/27/2014    Quit date: 2019    Years since quitting: 2.6  . Smokeless tobacco: Never Used  Vaping Use  . Vaping Use: Never used  Substance and Sexual Activity  . Alcohol use: Not Currently    Comment: used to drink constantly; now maybe 1/2 beer qod"  . Drug use: Never    Types: "Crack" cocaine    Comment: "stopped ~ 7-8 yrs ago yr ago"  . Sexual activity: Not Currently

## 2020-05-12 ENCOUNTER — Encounter (HOSPITAL_BASED_OUTPATIENT_CLINIC_OR_DEPARTMENT_OTHER): Payer: Medicare Other | Admitting: Internal Medicine

## 2020-05-27 ENCOUNTER — Telehealth (INDEPENDENT_AMBULATORY_CARE_PROVIDER_SITE_OTHER): Payer: Medicare Other | Admitting: Neurology

## 2020-05-27 ENCOUNTER — Ambulatory Visit: Payer: Medicare Other | Admitting: Family

## 2020-05-27 ENCOUNTER — Other Ambulatory Visit: Payer: Self-pay

## 2020-05-27 ENCOUNTER — Encounter: Payer: Self-pay | Admitting: Neurology

## 2020-05-27 DIAGNOSIS — Z5329 Procedure and treatment not carried out because of patient's decision for other reasons: Secondary | ICD-10-CM

## 2020-05-27 NOTE — Progress Notes (Deleted)
Virtual Visit via Video Note The purpose of this virtual visit is to provide medical care while limiting exposure to the novel coronavirus.    Consent was obtained for video visit:  {yes no:314532} Answered questions that patient had about telehealth interaction:  {yes no:314532} I discussed the limitations, risks, security and privacy concerns of performing an evaluation and management service by telemedicine. I also discussed with the patient that there may be a patient responsible charge related to this service. The patient expressed understanding and agreed to proceed.  Pt location: Home Physician Location: office Name of referring provider:  [name], MD I connected with [name] at patients initiation/request on 05/27/2020 at  3:30 PM EDT by video enabled telemedicine application and verified that I am speaking with the correct person using two identifiers. Pt MRN:  [id number] Pt DOB:  1950-10-12 Video Participants:  [name];  ***   History of Present Illness: ***  The patient had a telephone visit on 10/29/2019. His niece [name] is present to provide information. He was last seen in the neurology clinic 8 months ago for moderate dementia with behavioral disturbance. MMSE 10/30 in June 2020. Since his last visit, she reports he is overall stable. Memory comes and goes, he is in pretty good spirits. No paranoia or hallucinations. Sleep is good. He wears adult diapers. He needs more help with dressing and bathing. He denies any headaches, dizziness, no falls.    History on Initial Assessment 06/03/2018: This is a pleasant 68 year old right-handed man with a history of hypertension, hyperlipidemia, CAD s/p MI, paroxysmal atrial fibrillation, history of substance abuse and alcoholism, presenting for evaluation of dementia. The patient is a poor historian and mostly laughs inappropriately during the visit. He is able to answer simple questions and follow commands, but  cannot provide any history. His niece [name] does not know much of his history until he moved in with her last May 2019. She states that he was living with his long-term girlfriend for 20 years and she took care of him until she got sick. [name] started noticing memory changes for at least 10-15 years. He is not a big talker, but she noticed forgetfulness and needing constant reminders. She has never seen him drive. His girlfriend was managing his medications and finances, which [name] has taken over. [name] reports he had a heart attack in 1993 or 1995 and was drinking alcohol heavily until then. He has had no alcohol since moving in with her. She does not recall any inciting event for memory changes. He needs help with dressing and bathing. No paranoia or hallucinations, he is laughing most of the time. He denies any headaches, dizziness, diplopia, dysarthria, dysphagia, neck/back pain, focal numbness/tingling/weakness, bowel/bladder dysfunction. No anosmia, tremors, no recent falls. His right leg sometimes buckles, he always uses a walker. He denies any significant head injuries. No family history of dementia. Sleep is good. No paranoia or hallucinations.   Diagnostic Data: Head CT without contrast in 06/2018 did not show any acute changes, there was advanced chronic microvascular disease and chronic left parietal lobe and anterior left superior frontal gyrus encephalomalacia, mild diffuse volume loss.   Current Outpatient Medications on File Prior to Visit  Medication Sig Dispense Refill  . amiodarone (PACERONE) 200 MG tablet Take 200 mg by mouth 2 (two) times daily.    [name] apixaban (ELIQUIS) 5 MG TABS tablet Take 5 mg by mouth 2 (two) times daily with a meal.    . aspirin  EC 81 MG tablet Take 1 tablet (81 mg total) by mouth daily. 30 tablet 3  . atorvastatin (LIPITOR) 40 MG tablet Take 40 mg by mouth at bedtime.     . docusate sodium (COLACE) 100 MG capsule Take 1 capsule (100 mg total) by mouth 2 (two)  times daily. 10 capsule 0  . folic acid (FOLVITE) 1 MG tablet Take 1 mg by mouth daily.      No current facility-administered medications on file prior to visit.     Observations/Objective:   Vitals:   GEN:  The patient appears stated age and is in NAD.  Neurological examination: Patient is awake, alert, oriented x 3. No aphasia or dysarthria. Intact fluency and comprehension. Remote and recent memory intact. Able to name and repeat. Cranial nerves: Extraocular movements intact with no nystagmus. No facial asymmetry. Motor: moves all extremities symmetrically, at least anti-gravity x 4. No incoordination on finger to nose testing. Gait: narrow-based and steady, able to tandem walk adequately. Negative Romberg test.    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 MMSE 10/30 in June 2020. We had previously discussed dementia medications and low potential benefit at this point, he is not having significant behavioral or sleep issues, continue 24/7 supervision. He does not drive. Follow-up in 6 months, he knows to call for any changes.    Follow Up Instructions:    -I discussed the assessment and treatment plan with the patient. The patient was provided an opportunity to ask questions and all were answered. The patient agreed with the plan and demonstrated an understanding of the instructions.   The patient was advised to call back or seek an in-person evaluation if the symptoms worsen or if the condition fails to improve as anticipated.    Total time spent on today's visit was ***minutes, including both face-to-face time and  nonface-to-face time.  Time included that spent on review of records (prior notes available to me/labs/imaging if pertinent), discussing treatment and goals, answering patient's questions and coordinating care.   Cameron Sprang, MD

## 2020-05-31 ENCOUNTER — Ambulatory Visit: Payer: Medicare Other | Admitting: Family

## 2020-05-31 NOTE — Progress Notes (Signed)
Niece could not do virtual visit due to scheduling, asked to r/s

## 2020-06-03 ENCOUNTER — Other Ambulatory Visit: Payer: Self-pay

## 2020-06-03 ENCOUNTER — Telehealth (INDEPENDENT_AMBULATORY_CARE_PROVIDER_SITE_OTHER): Payer: Medicare Other | Admitting: Neurology

## 2020-06-03 ENCOUNTER — Encounter: Payer: Self-pay | Admitting: Neurology

## 2020-06-03 VITALS — Wt 153.0 lb

## 2020-06-03 DIAGNOSIS — Z5329 Procedure and treatment not carried out because of patient's decision for other reasons: Secondary | ICD-10-CM

## 2020-06-03 NOTE — Progress Notes (Signed)
Contacted niece [name], she is driving and not with patient. Requesting to reschedule again.

## 2020-06-06 ENCOUNTER — Encounter (HOSPITAL_BASED_OUTPATIENT_CLINIC_OR_DEPARTMENT_OTHER): Payer: Medicare Other | Admitting: Internal Medicine

## 2020-06-08 ENCOUNTER — Encounter (HOSPITAL_BASED_OUTPATIENT_CLINIC_OR_DEPARTMENT_OTHER): Payer: Medicare Other | Admitting: Physician Assistant

## 2020-07-04 ENCOUNTER — Encounter (HOSPITAL_BASED_OUTPATIENT_CLINIC_OR_DEPARTMENT_OTHER): Payer: Medicare Other | Admitting: Internal Medicine

## 2020-07-07 ENCOUNTER — Encounter (HOSPITAL_BASED_OUTPATIENT_CLINIC_OR_DEPARTMENT_OTHER): Payer: Medicare Other | Attending: Internal Medicine | Admitting: Internal Medicine

## 2020-07-07 ENCOUNTER — Other Ambulatory Visit: Payer: Self-pay

## 2020-07-07 DIAGNOSIS — Y835 Amputation of limb(s) as the cause of abnormal reaction of the patient, or of later complication, without mention of misadventure at the time of the procedure: Secondary | ICD-10-CM | POA: Diagnosis not present

## 2020-07-07 DIAGNOSIS — Z89611 Acquired absence of right leg above knee: Secondary | ICD-10-CM | POA: Insufficient documentation

## 2020-07-07 DIAGNOSIS — E1151 Type 2 diabetes mellitus with diabetic peripheral angiopathy without gangrene: Secondary | ICD-10-CM | POA: Insufficient documentation

## 2020-07-07 DIAGNOSIS — L97826 Non-pressure chronic ulcer of other part of left lower leg with bone involvement without evidence of necrosis: Secondary | ICD-10-CM | POA: Diagnosis not present

## 2020-07-07 DIAGNOSIS — E1122 Type 2 diabetes mellitus with diabetic chronic kidney disease: Secondary | ICD-10-CM | POA: Insufficient documentation

## 2020-07-07 DIAGNOSIS — N184 Chronic kidney disease, stage 4 (severe): Secondary | ICD-10-CM | POA: Insufficient documentation

## 2020-07-07 DIAGNOSIS — E11622 Type 2 diabetes mellitus with other skin ulcer: Secondary | ICD-10-CM | POA: Diagnosis not present

## 2020-07-07 DIAGNOSIS — I129 Hypertensive chronic kidney disease with stage 1 through stage 4 chronic kidney disease, or unspecified chronic kidney disease: Secondary | ICD-10-CM | POA: Insufficient documentation

## 2020-07-07 DIAGNOSIS — F0150 Vascular dementia without behavioral disturbance: Secondary | ICD-10-CM | POA: Diagnosis not present

## 2020-07-07 DIAGNOSIS — T8789 Other complications of amputation stump: Secondary | ICD-10-CM | POA: Diagnosis present

## 2020-07-07 DIAGNOSIS — I70241 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: Secondary | ICD-10-CM | POA: Diagnosis not present

## 2020-07-07 DIAGNOSIS — Z89612 Acquired absence of left leg above knee: Secondary | ICD-10-CM | POA: Insufficient documentation

## 2020-07-08 NOTE — Progress Notes (Signed)
FRUTOSO, DIMARE ([id number]) Visit Report for 07/07/2020 Allergy List Details Patient Name: Date of Service: [name] [place] 07/07/2020 10:30 A M Medical Record Number: [id number] Patient Account Number: [id number] Date of Birth/Sex: Treating RN: 17-May-1951 (69 y.o. [name]) [name] Primary Care Skylee Baird: [name] Other Clinician: Referring Mikaya Bunner: Treating Shondrika Hoque/Extender: [name] in Treatment: 0 Allergies Active Allergies No Known Allergies Allergy Notes Electronic Signature(s) Signed: 07/08/2020 5:00:24 PM By: [name] RN Entered By: [name] on 07/07/2020 11:05:23 -------------------------------------------------------------------------------- Arrival Information Details Patient Name: Date of Service: [name] [place] 07/07/2020 10:30 A M Medical Record Number: [id number] Patient Account Number: [id number] Date of Birth/Sex: Treating RN: 29-Feb-1951 (69 y.o. [name] Primary Care Ivett Luebbe: [name] Other Clinician: Referring Makaylia Hewett: Treating Rejoice Heatwole/Extender: [name] in Treatment: 0 Visit Information Patient Arrived: Wheel Chair Arrival Time: 11:03 Accompanied By: neice Transfer Assistance: None Patient Identification Verified: Yes Secondary Verification Process Completed: Yes Patient Requires Transmission-Based Precautions: No Patient Has Alerts: No Electronic Signature(s) Signed: 07/08/2020 5:00:24 PM By: [name] RN Entered By: [name] on 07/07/2020 11:03:57 -------------------------------------------------------------------------------- Clinic Level of Care Assessment Details Patient Name: Date of Service: [name] [place] 07/07/2020 10:30 A M Medical Record Number: [id number] Patient Account Number: [id number] Date of Birth/Sex: Treating RN: August 18, 1951 (69 y.o. [name] Primary Care Mckinsey Keagle: [name] Other Clinician: Referring  Travius Crochet: Treating Nazariah Cadet/Extender: [name] in Treatment: 0 Clinic Level of Care Assessment Items TOOL 1 Quantity Score X- 1 0 Use when EandM and Procedure is performed on INITIAL visit ASSESSMENTS - Nursing Assessment / Reassessment X- 1 20 General Physical Exam (combine w/ comprehensive assessment (listed just below) when performed on new pt. evals) X- 1 25 Comprehensive Assessment (HX, ROS, Risk Assessments, Wounds Hx, etc.) ASSESSMENTS - Wound and Skin Assessment / Reassessment X- 1 10 Dermatologic / Skin Assessment (not related to wound area) ASSESSMENTS - Ostomy and/or Continence Assessment and Care []  - 0 Incontinence Assessment and Management []  - 0 Ostomy Care Assessment and Management (repouching, etc.) PROCESS - Coordination of Care X - Simple Patient / Family Education for ongoing care 1 15 []  - 0 Complex (extensive) Patient / Family Education for ongoing care X- 1 10 Staff obtains Programmer, systems, Records, T Results / Process Orders est []  - 0 Staff telephones HHA, Nursing Homes / Clarify orders / etc []  - 0 Routine Transfer to another Facility (non-emergent condition) []  - 0 Routine Hospital Admission (non-emergent condition) X- 1 15 New Admissions / Biomedical engineer / Ordering NPWT Apligraf, etc. , []  - 0 Emergency Hospital Admission (emergent condition) PROCESS - Special Needs []  - 0 Pediatric / Minor Patient Management []  - 0 Isolation Patient Management []  - 0 Hearing / Language / Visual special needs []  - 0 Assessment of Community assistance (transportation, D/C planning, etc.) []  - 0 Additional assistance / Altered mentation []  - 0 Support Surface(s) Assessment (bed, cushion, seat, etc.) INTERVENTIONS - Miscellaneous []  - 0 External ear exam []  - 0 Patient Transfer (multiple staff / Civil Service fast streamer / Similar devices) []  - 0 Simple Staple / Suture removal (25 or less) []  - 0 Complex Staple / Suture removal  (26 or more) []  - 0 Hypo/Hyperglycemic Management (do not check if billed separately) []  - 0 Ankle / Brachial Index (ABI) - do not check if billed separately Has the patient been seen at the hospital within the last three years: Yes Total Score: 95 Level Of  Care: New/Established - Level 3 Electronic Signature(s) Signed: 07/07/2020 6:08:00 PM By: Deon Pilling Entered By: Deon Pilling on 07/07/2020 11:48:40 -------------------------------------------------------------------------------- Encounter Discharge Information Details Patient Name: Date of Service: [name] [place] 07/07/2020 10:30 A M Medical Record Number: [id number] Patient Account Number: [id number] Date of Birth/Sex: Treating RN: 08/01/1951 (69 y.o. [name] Primary Care Ranier Coach: [name] Other Clinician: Referring Donielle Radziewicz: Treating Osamah Schmader/Extender: [name] in Treatment: 0 Encounter Discharge Information Items Post Procedure Vitals Discharge Condition: Stable Temperature (F): 98.7 Ambulatory Status: Wheelchair Pulse (bpm): 60 Discharge Destination: Home Respiratory Rate (breaths/min): 18 Transportation: Private Auto Blood Pressure (mmHg): 143/83 Accompanied By: niece Schedule Follow-up Appointment: Yes Clinical Summary of Care: Patient Declined Electronic Signature(s) Signed: 07/07/2020 5:54:11 PM By: Levan Hurst RN, BSN Entered By: Levan Hurst on 07/07/2020 14:48:48 -------------------------------------------------------------------------------- Multi Wound Chart Details Patient Name: Date of Service: [name] [place] 07/07/2020 10:30 A M Medical Record Number: [id number] Patient Account Number: [id number] Date of Birth/Sex: Treating RN: 1951-06-26 (69 y.o. [name] Primary Care Thadeus Gandolfi: [name] Other Clinician: Referring Estefanie Cornforth: Treating Shaelin Lalley/Extender: [name] in Treatment: 0 Vital  Signs Height(in): 73 Pulse(bpm): 60 Weight(lbs): 150 Blood Pressure(mmHg): 143/83 Body Mass Index(BMI): 20 Temperature(F): 98.7 Respiratory Rate(breaths/min): 18 Photos: [1:No Photos Left Amputation Site - Above Knee] [N/A:N/A N/A] Wound Location: [1:Gradually Appeared] [N/A:N/A] Wounding Event: [1:Pressure Ulcer] [N/A:N/A] Primary Etiology: [1:Coronary Artery Disease,] [N/A:N/A] Comorbid History: [1:Hypertension, Peripheral Arterial Disease, Peripheral Venous Disease, Hepatitis C, Type II Diabetes, Osteomyelitis, Dementia 03/03/2020] [N/A:N/A] Date Acquired: [1:0] [N/A:N/A] Weeks of Treatment: [1:Open] [N/A:N/A] Wound Status: [1:0.9x0.4x0.3] [N/A:N/A] Measurements L x W x D (cm) [1:0.283] [N/A:N/A] A (cm) : rea [1:0.085] [N/A:N/A] Volume (cm) : [1:Category/Stage IV] [N/A:N/A] Classification: [1:Medium] [N/A:N/A] Exudate A mount: [1:Serosanguineous] [N/A:N/A] Exudate Type: [1:red, brown] [N/A:N/A] Exudate Color: [1:Large (67-100%)] [N/A:N/A] Granulation A mount: [1:Pink] [N/A:N/A] Granulation Quality: [1:None Present (0%)] [N/A:N/A] Necrotic A mount: [1:Bone: Yes] [N/A:N/A] Exposed Structures: [1:Fascia: No Fat Layer (Subcutaneous Tissue): No Tendon: No Muscle: No Joint: No None] [N/A:N/A] Epithelialization: [1:Debridement - Excisional] [N/A:N/A] Debridement: Pre-procedure Verification/Time Out 11:35 [N/A:N/A] Taken: [1:Lidocaine Injectable] [N/A:N/A] Pain Control: [1:Bone] [N/A:N/A] Tissue Debrided: [1:Skin/Subcutaneous] [N/A:N/A] Level: [1:Tissue/Muscle/Bone 0.25] [N/A:N/A] Debridement A (sq cm): [1:rea Curette, Rongeur] [N/A:N/A] Instrument: [1:Moderate] [N/A:N/A] Bleeding: [1:Silver Nitrate] [N/A:N/A] Hemostasis Achieved: [1:0] [N/A:N/A] Procedural Pain: [1:3] [N/A:N/A] Post Procedural Pain: Debridement Treatment Response: Procedure was tolerated well [N/A:N/A] Post Debridement Measurements L x 0.9x0.4x0.3 [N/A:N/A] W x D (cm) [1:0.085] [N/A:N/A] Post Debridement  Volume: (cm) [1:Category/Stage IV] [N/A:N/A] Post Debridement Stage: [1:Debridement] [N/A:N/A] Treatment Notes Electronic Signature(s) Signed: 07/07/2020 5:37:49 PM By: Linton Ham MD Signed: 07/07/2020 6:08:00 PM By: Deon Pilling Entered By: Linton Ham on 07/07/2020 12:52:44 -------------------------------------------------------------------------------- Multi-Disciplinary Care Plan Details Patient Name: Date of Service: [name] ME 07/07/2020 10:30 A M Medical Record Number: [id number] Patient Account Number: [id number] Date of Birth/Sex: Treating RN: [birth year] (69 y.o. [name] Primary Care Dimitra Woodstock: [name] Other Clinician: Referring Jenavi Beedle: Treating Rayn Enderson/Extender: [name] in Treatment: 0 Active Inactive Nutrition Nursing Diagnoses: Potential for alteratiion in Nutrition/Potential for imbalanced nutrition Goals: Patient/caregiver agrees to and verbalizes understanding of need to obtain nutritional consultation Date Initiated: 07/07/2020 Target Resolution Date: 08/12/2020 Goal Status: Active Interventions: Provide education on nutrition Treatment Activities: Patient referred to Primary Care Physician for further nutritional evaluation : 07/07/2020 Notes: Orientation to the Wound Care Program Nursing Diagnoses: Knowledge deficit related to the wound healing center program Goals: Patient/caregiver will verbalize understanding of  the Wound Healing Center Program Date Initiated: 07/07/2020 Target Resolution Date: 08/11/2020 Goal Status: Active Interventions: Provide education on orientation to the wound center Notes: Pain, Acute or Chronic Nursing Diagnoses: Pain, acute or chronic: actual or potential Potential alteration in comfort, pain Goals: Patient will verbalize adequate pain control and receive pain control interventions during procedures as needed Date Initiated: 07/07/2020 Target Resolution  Date: 08/12/2020 Goal Status: Active Interventions: Encourage patient to take pain medications as prescribed Provide education on pain management Reposition patient for comfort Treatment Activities: Administer pain control measures as ordered : 07/07/2020 Notes: Wound/Skin Impairment Nursing Diagnoses: Knowledge deficit related to ulceration/compromised skin integrity Goals: Patient/caregiver will verbalize understanding of skin care regimen Date Initiated: 07/07/2020 Target Resolution Date: 09/09/2020 Goal Status: Active Interventions: Assess patient/caregiver ability to obtain necessary supplies Assess patient/caregiver ability to perform ulcer/skin care regimen upon admission and as needed Provide education on ulcer and skin care Treatment Activities: Skin care regimen initiated : 07/07/2020 Topical wound management initiated : 07/07/2020 Notes: Electronic Signature(s) Signed: 07/07/2020 6:08:00 PM By: Deon Pilling Entered By: Deon Pilling on 07/07/2020 11:05:25 -------------------------------------------------------------------------------- Pain Assessment Details Patient Name: Date of Service: [name] [place] 07/07/2020 10:30 A M Medical Record Number: [id number] Patient Account Number: [id number] Date of Birth/Sex: Treating RN: 1951-05-02 (69 y.o. [name] Primary Care Shamona Wirtz: [name] Other Clinician: Referring Shawn Carattini: Treating [name]/Extender: [name] in Treatment: 0 Active Problems Location of Pain Severity and Description of Pain Patient Has Paino Yes Site Locations Site Locations Pain Location: Pain in Ulcers With Dressing Change: Yes Duration of the Pain. Constant / Intermittento Intermittent Rate the pain. Current Pain Level: 4 Worst Pain Level: 7 Least Pain Level: 0 Tolerable Pain Level: 5 Character of Pain Describe the Pain: Aching, Tender Pain Management and Medication Current Pain  Management: Medication: Yes Cold Application: No Rest: Yes Massage: No Activity: No T.E.N.S.: No Heat Application: No Leg drop or elevation: No Is the Current Pain Management Adequate: Inadequate How does your wound impact your activities of daily livingo Sleep: No Bathing: No Appetite: Yes Relationship With Others: No Bladder Continence: No Emotions: No Bowel Continence: No Work: No Toileting: No Drive: No Dressing: No Hobbies: No Electronic Signature(s) Signed: 07/08/2020 5:00:24 PM By: [name] RN Entered By: [name] on 07/07/2020 11:22:46 -------------------------------------------------------------------------------- Patient/Caregiver Education Details Patient Name: Date of Service: [name] ME 11/4/2021andnbsp10:30 [name] Record Number: [id number] Patient Account Number: [id number] Date of Birth/Gender: Treating RN: 09/11/1950 (69 y.o. [name] Primary Care Physician: [name] Other Clinician: Referring Physician: Treating Physician/Extender: [name] in Treatment: 0 Education Assessment Education Provided To: Caregiver Education Topics Provided Welcome T [facility] [place]: o Handouts: Welcome T [facility] [place] o Methods: Explain/Verbal, Printed Responses: Reinforcements needed Electronic Signature(s) Signed: 07/07/2020 6:08:00 PM By: Deon Pilling Entered By: Deon Pilling on 07/07/2020 11:35:05 -------------------------------------------------------------------------------- Wound Assessment Details Patient Name: Date of Service: [name] [place] 07/07/2020 10:30 A M Medical Record Number: [id number] Patient Account Number: [id number] Date of Birth/Sex: Treating RN: 05/14/1951 (69 y.o. 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 Wound Open Wounding Event: Gradually Appeared Status: Date Acquired: 03/03/2020 Comorbid Coronary Artery Disease, Hypertension, Peripheral Arterial Disease, Weeks Of Treatment: 0 History: Peripheral Venous Disease, Hepatitis C,  Type II Diabetes, Clustered Wound: No Osteomyelitis, Dementia Wound Measurements Length: (cm) 0.9 Width: (cm) 0.4 Depth: (cm) 0.3 Area: (cm) 0.283 Volume: (cm) 0.085 % Reduction in Area: % Reduction in Volume: Epithelialization: None Tunneling: No Undermining: No Wound Description Classification: Category/Stage IV Exudate Amount: Medium Exudate Type: Serosanguineous Exudate Color: red, brown Foul Odor After Cleansing: No Slough/Fibrino No Wound Bed Granulation Amount: Large (67-100%) Exposed Structure Granulation Quality: Pink Fascia Exposed: No Necrotic Amount: None Present (0%) Fat Layer (Subcutaneous Tissue) Exposed: No Tendon Exposed: No Muscle Exposed: No Joint Exposed: No Bone Exposed: Yes Treatment Notes Wound #1 (Left Amputation Site - Above Knee) 1. Cleanse With Wound Cleanser 3. Primary Dressing Applied Endoform 4. Secondary Dressing Foam Border Dressing Electronic Signature(s) Signed: 07/08/2020 5:00:24 PM By: [name] RN Entered By: [name] on 07/07/2020 11:21:46 -------------------------------------------------------------------------------- Vitals Details Patient Name: Date of Service: [name] [place] 07/07/2020 10:30 A M Medical Record Number: [id number] Patient Account Number: [id number] Date of Birth/Sex: Treating RN: 01-03-1951 (69 y.o. [name]) [name] Primary Care Chessa Barrasso: [name] Other Clinician: Referring Dejion Grillo: Treating Shimon Trowbridge/Extender: [name] in Treatment: 0 Vital Signs Time Taken: 11:04 Temperature (F): 98.7 Height (in): 73 Pulse (bpm):  60 Source: Stated Respiratory Rate (breaths/min): 18 Weight (lbs): 150 Blood Pressure (mmHg): 143/83 Source: Stated Reference Range: 80 - 120 mg / dl Body Mass Index (BMI): 19.8 Electronic Signature(s) Signed: 07/08/2020 5:00:24 PM By: [name] RN Entered By: [name] on 07/07/2020 11:05:05

## 2020-07-08 NOTE — Progress Notes (Signed)
[name], [name] ([id number]) Visit Report for 07/07/2020 Abuse/Suicide Risk Screen Details Patient Name: Date of Service: [name] [place] 07/07/2020 10:30 A M Medical Record Number: [id number] Patient Account Number: [id number] Date of Birth/Sex: Treating RN: [birth year] (69 y.o. [name]) [name] Primary Care [name]: [name] Other Clinician: Referring Joette Schmoker: Treating [name]/Extender: [name] in Treatment: 0 Abuse/Suicide Risk Screen Items Answer ABUSE RISK SCREEN: Has anyone close to you tried to hurt or harm you recentlyo No Do you feel uncomfortable with anyone in your familyo No Has anyone forced you do things that you didnt want to doo No Electronic Signature(s) Signed: 07/08/2020 5:00:24 PM By: Arthur Coria RN Entered By: [name] on 07/07/2020 11:13:31 -------------------------------------------------------------------------------- Activities of Daily Living Details Patient Name: Date of Service: [name] [place] 07/07/2020 10:30 A M Medical Record Number: [id number] Patient Account Number: [id number] Date of Birth/Sex: Treating RN: 03-08-1951 (69 y.o. [name]) [name] Primary Care [name]: [name] Other Clinician: Referring [name]: Treating [name]/Extender: [name] in Treatment: 0 Activities of Daily Living Items Answer Activities of Daily Living (Please select one for each item) Drive Automobile Not Able T Medications ake Not Able Use T elephone Need Assistance Care for Appearance Not Able Use T oilet Not Able Bath / Shower Not Able Dress Self Not Able Feed Self Need Assistance Walk Not Able Get In / Out Bed Not Able Housework Not Able Prepare Meals Not Able Handle Money Not Able Shop for Self Not Able Electronic Signature(s) Signed: 07/08/2020 5:00:24 PM By: Arthur Coria RN Entered By: [name] on 07/07/2020  11:14:16 -------------------------------------------------------------------------------- Education Screening Details Patient Name: Date of Service: [name] [place] 07/07/2020 10:30 A M Medical Record Number: [id number] Patient Account Number: [id number] Date of Birth/Sex: Treating RN: 17-Feb-1951 (69 y.o. [name]) [name] Primary Care [name]: [name] Other Clinician: Referring [name]: Treating [name]/Extender: [name] in Treatment: 0 Primary Learner Assessed: Patient Learning Preferences/Education Level/Primary Language Learning Preference: Explanation Highest Education Level: High School Preferred Language: English Cognitive Barrier Language Barrier: No Translator Needed: No Memory Deficit: No Emotional Barrier: No Cultural/Religious Beliefs Affecting Medical Care: No Physical Barrier Impaired Vision: No Impaired Hearing: No Decreased Hand dexterity: No Knowledge/Comprehension Knowledge Level: Medium Comprehension Level: Medium Ability to understand written instructions: Medium Ability to understand verbal instructions: Medium Motivation Anxiety Level: Anxious Cooperation: Cooperative Education Importance: Acknowledges Need Interest in Health Problems: Asks Questions Perception: Coherent Willingness to Engage in Self-Management High Activities: Readiness to Engage in Self-Management High Activities: Electronic Signature(s) Signed: 07/08/2020 5:00:24 PM By: Arthur Coria RN Entered By: [name] on 07/07/2020 11:15:10 -------------------------------------------------------------------------------- Fall Risk Assessment Details Patient Name: Date of Service: [name] [place] 07/07/2020 10:30 A M Medical Record Number: [id number] Patient Account Number: [id number] Date of Birth/Sex: Treating RN: [birth year] (69 y.o. [name] Primary Care [name]: [name] Other Clinician: Referring  [name]: Treating [name]/Extender: [name] in Treatment: 0 Fall Risk Assessment Items Have you had 2 or more falls in the last 12 monthso 0 No Have you had any fall that resulted in injury in the last 12 monthso 0 No FALLS RISK SCREEN History of falling - immediate or within 3 months 0 No Secondary diagnosis (Do you have 2 or more medical diagnoseso) 0 No Ambulatory aid None/bed rest/wheelchair/nurse 0 No Crutches/cane/walker 0 No Furniture 0 No Intravenous therapy Access/Saline/Heparin Lock 0 No Gait/Transferring Normal/ bed rest/ wheelchair 0 No Weak (short steps  with or without shuffle, stooped but able to lift head while walking, may seek 0 No support from furniture) Impaired (short steps with shuffle, may have difficulty arising from chair, head down, impaired 0 No balance) Mental Status Oriented to own ability 0 No Electronic Signature(s) Signed: 07/08/2020 5:00:24 PM By: Arthur Coria RN Entered By: [name] on 07/07/2020 11:15:25 -------------------------------------------------------------------------------- Nutrition Risk Screening Details Patient Name: Date of Service: [name] [place] 07/07/2020 10:30 A M Medical Record Number: [id number] Patient Account Number: [id number] Date of Birth/Sex: Treating RN: [birth year] (69 y.o. [name]) [name] Primary Care [name]: [name] Other Clinician: Referring [name]: Treating [name]/Extender: [name] in Treatment: 0 Height (in): 73 Weight (lbs): 150 Body Mass Index (BMI): 19.8 Nutrition Risk Screening Items Score Screening NUTRITION RISK SCREEN: I have an illness or condition that made me change the kind and/or amount of food I eat 0 No I eat fewer than two meals per day 0 No I eat few fruits and vegetables, or milk products 0 No I have three or more drinks of beer, liquor or wine almost every day 0 No I have tooth or mouth problems  that make it hard for me to eat 0 No I don't always have enough money to buy the food I need 0 No I eat alone most of the time 0 No I take three or more different prescribed or over-the-counter drugs a day 1 Yes Without wanting to, I have lost or gained 10 pounds in the last six months 2 Yes I am not always physically able to shop, cook and/or feed myself 2 Yes Nutrition Protocols Good Risk Protocol Moderate Risk Protocol 0 Provide education on nutrition High Risk Proctocol Risk Level: Moderate Risk Score: 5 Electronic Signature(s) Signed: 07/08/2020 5:00:24 PM By: Arthur Coria RN Entered By: [name] on 07/07/2020 11:15:47

## 2020-07-11 NOTE — Progress Notes (Signed)
Arthur, [name] ([id number]) Visit Report for 07/07/2020 Chief Complaint Document Details Patient Name: Date of Service: [name] ME 07/07/2020 10:30 A M Medical Record Number: [id number] Patient Account Number: [id number] Date of Birth/Sex: Treating RN: 05-22-1951 (69 y.o. [name] Primary Care Provider: [name] Other Clinician: Referring Provider: Treating Provider/Extender: [name] in Treatment: 0 Information Obtained from: Patient Chief Complaint 07/07/2020; patient is here for review of wound on his left AKA stump Electronic Signature(s) Signed: 07/07/2020 5:37:49 PM By: Linton Ham MD Entered By: Linton Ham on 07/07/2020 12:53:23 -------------------------------------------------------------------------------- Debridement Details Patient Name: Date of Service: [name] ME 07/07/2020 10:30 A M Medical Record Number: [id number] Patient Account Number: [id number] Date of Birth/Sex: Treating RN: 1951-06-12 (69 y.o. [name] Primary Care Provider: [name] Other Clinician: Referring Provider: Treating Provider/Extender: [name] in Treatment: 0 Debridement Performed for Assessment: Wound #1 Left Amputation Site - Above Knee Performed By: Physician Ricard Dillon., MD Debridement Type: Debridement Level of Consciousness (Pre-procedure): Awake and Alert Pre-procedure Verification/Time Out Yes - 11:35 Taken: Start Time: 11:36 Pain Control: Lidocaine Injectable : 1 T Area Debrided (L x W): otal 0.5 (cm) x 0.5 (cm) = 0.25 (cm) Tissue and other material debrided: Viable, Bone, Subcutaneous Level: Skin/Subcutaneous Tissue/Muscle/Bone Debridement Description: Excisional Instrument: Curette, Rongeur Bleeding: Moderate Hemostasis Achieved: Silver Nitrate End Time: 11:41 Procedural Pain: 0 Post Procedural Pain: 3 Response to Treatment: Procedure was tolerated  well Level of Consciousness (Post- Awake and Alert procedure): Post Debridement Measurements of Total Wound Length: (cm) 0.9 Stage: Category/Stage IV Width: (cm) 0.4 Depth: (cm) 0.3 Volume: (cm) 0.085 Character of Wound/Ulcer Post Debridement: Improved Post Procedure Diagnosis Same as Pre-procedure Electronic Signature(s) Signed: 07/07/2020 5:37:49 PM By: Linton Ham MD Signed: 07/07/2020 6:08:00 PM By: Deon Pilling Entered By: Linton Ham on 07/07/2020 12:53:01 -------------------------------------------------------------------------------- HPI Details Patient Name: Date of Service: [name] ME 07/07/2020 10:30 A M Medical Record Number: [id number] Patient Account Number: [id number] Date of Birth/Sex: Treating RN: 04/01/1951 (69 y.o. [name] Primary Care Provider: [name] Other Clinician: Referring Provider: Treating Provider/Extender: [name] in Treatment: 0 History of Present Illness HPI Description: Admission 07/07/2020 This is a 69 year old man who has severe PAD. 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 This was after an angiogram showed bilateral superficial femoral artery occlusion. No named vessels visualized in either lower legs with gangrenous wounds. The patient is here with his niece who says that about 2 months ago he developed a wound on his left AKA stump tip. Seen by orthopedic surgery on 05/06/2020 at [facility]. Noted at the time that he had some bony prominence out of wound in the distal femur. However at that time there was granulation in the wound bed. They recommended topical antibiotics and a covering dressing. Patient had previously seen his primary physician at [place] and was put on Keflex in late August for 2 weeks. The patient describes some pain in the area but there is no purulent drainage she has not been systemically unwell. Past  medical history includes bilateral AKA's in 2020 as noted, vascular dementia, atrial fib, history of alcohol abuse, chronic kidney disease stage IV. He is under comfort care at home but is not on hospice. Electronic Signature(s) Signed: 07/07/2020 5:37:49 PM By: Linton Ham MD Entered By: Linton Ham on 07/07/2020 12:58:17 -------------------------------------------------------------------------------- Physical Exam Details Patient Name: Date  of Service: [name] ME 07/07/2020 10:30 A M Medical Record Number: [id number] Patient Account Number: [id number] Date of Birth/Sex: Treating RN: 1950-12-05 (69 y.o. [name] Primary Care Provider: [name] Other Clinician: Referring Provider: Treating Provider/Extender: [name] in Treatment: 0 Constitutional Sitting or standing Blood Pressure is within target range for patient.. Pulse regular and within target range for patient.[name] Respirations regular, non-labored and within target range.. Temperature is normal and within the target range for the patient.[name] Appears in no distress. Cardiovascular Femoral pulse on the left is easily palpable. Integumentary (Hair, Skin) no erythema around the wound.[name] Psychiatric appears at normal baseline. Notes Wound exam; the area in question is on the tip of the left AKA stump. This is a small wound but completely filled with exposed bone presumably femur. There is no surrounding obvious infection no purulent drainage. Electronic Signature(s) Signed: 07/07/2020 5:37:49 PM By: Linton Ham MD Entered By: Linton Ham on 07/07/2020 13:01:12 -------------------------------------------------------------------------------- Physician Orders Details Patient Name: Date of Service: [name] ME 07/07/2020 10:30 A M Medical Record Number: [id number] Patient Account Number: [id number] Date of Birth/Sex: Treating RN: 1950/12/03 (69 y.o. [name] Primary Care Provider: [name] Other Clinician: Referring Provider: Treating Provider/Extender: [name] in Treatment: 0 Verbal / Phone Orders: No Diagnosis Coding Follow-up Appointments Return Appointment in 1 week. Dressing Change Frequency Change Dressing every other day. Skin Barriers/Peri-Wound Care Skin Prep Wound Cleansing May shower and wash wound with soap and water. - with dressing changes only. Primary Wound Dressing Wound #1 Left Amputation Site - Above Knee Endoform - pack into wound bed. Secondary Dressing ABD pad - or foam border. Patient Medications llergies: No Known Allergies A Notifications Medication Indication Start End lidocaine HCl DOSE injection 10 mg/mL (1 %) solution - solution injection 0.8mL injected into periwound for debridement by MD. Electronic Signature(s) Signed: 07/07/2020 5:37:49 PM By: Linton Ham MD Signed: 07/07/2020 6:08:00 PM By: Deon Pilling Entered By: Deon Pilling on 07/07/2020 11:49:58 Prescription 07/07/2020 -------------------------------------------------------------------------------- Lula Olszewski MD Patient Name: Provider: 1950/11/13 [id number] Date of Birth: NPI#: [id number] Sex: DEA #: [phone] [id number] Phone #: License #: [place] Patient Address: [address] [place], [place] [address] [place], [place] [address] [phone] Allergies No Known Allergies Medication Medication: Route: Strength: Form: lidocaine 10 mg/mL (1 %) injection solution injection 10 mg/mL (1 %) solution Class: LOCAL ANESTHETICS Dose: Frequency / Time: Indication: solution injection 0.65mL injected into periwound for debridement by MD. Number of Refills: Number of Units: 0 Milliliter(s) Generic Substitution: Start Date: End Date: One Time Use: Substitution Permitted No Note to  Pharmacy: Hand Signature: Date(s): Electronic Signature(s) Signed: 07/07/2020 5:37:49 PM By: Linton Ham MD Signed: 07/07/2020 6:08:00 PM By: Deon Pilling Entered By: Deon Pilling on 07/07/2020 11:49:58 -------------------------------------------------------------------------------- Problem List Details Patient Name: Date of Service: [name] ME 07/07/2020 10:30 A M Medical Record Number: [id number] Patient Account Number: [id number] Date of Birth/Sex: Treating RN: 1951/05/02 (69 y.o. 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 [name] Primary Care Provider: [name] Other Clinician: Referring Provider: Treating Provider/Extender: [name] in Treatment: 0 Subjective Chief Complaint Information obtained from Patient 07/07/2020; patient is here for review of wound on his left AKA stump History of Present Illness (HPI) Admission 07/07/2020 This is a 69 year old man who has severe PAD. He underwent a left AKA on  07/25/2019 and a right AKA in September 2020. This was after an angiogram showed bilateral superficial femoral artery occlusion. No named vessels visualized in either lower legs with gangrenous wounds. The patient is here with his niece who says that about 2 months ago he developed a wound on his left AKA stump tip. Seen by orthopedic surgery on 05/06/2020 at [facility]. Noted at the time that he had some bony prominence out of wound in the distal femur. However at that time there was granulation in the wound bed. They recommended topical antibiotics and a covering dressing. Patient had previously seen his primary physician at [place] and was put on Keflex in late August for 2 weeks. The patient describes some pain in the area but there is no purulent drainage she has not been systemically unwell. Past medical history includes bilateral AKA's in 2020 as noted, vascular dementia, atrial fib, history of alcohol abuse, chronic kidney disease stage IV. He is under comfort care at home but is not on hospice. Patient History Allergies No Known Allergies Family History Cancer - Siblings, Diabetes - Siblings, Heart Disease - Siblings, Hypertension - Siblings, Kidney Disease - Siblings, No family history of Hereditary Spherocytosis, Lung Disease, Seizures, Stroke, Thyroid Problems, Tuberculosis. Social History Former smoker, Marital Status - Single, Alcohol Use - Never, Drug Use - Prior History, Caffeine Use - Moderate. Medical History Eyes Denies history of Cataracts, Glaucoma, Optic Neuritis Ear/Nose/Mouth/Throat Denies history of Chronic sinus problems/congestion, Middle ear problems Hematologic/Lymphatic Denies history of Anemia, Hemophilia, Human Immunodeficiency Virus, Lymphedema, Sickle Cell Disease Respiratory Denies history of Aspiration, Asthma, Chronic Obstructive Pulmonary Disease (COPD), Pneumothorax, Sleep Apnea, Tuberculosis Cardiovascular Patient has history  of Coronary Artery Disease, Hypertension, Peripheral Arterial Disease, Peripheral Venous Disease Denies history of Angina, Arrhythmia, Congestive Heart Failure, Deep Vein Thrombosis, Hypotension, Myocardial Infarction, Phlebitis, Vasculitis Gastrointestinal Patient has history of Hepatitis C Denies history of Cirrhosis , Colitis, Crohnoos, Hepatitis A, Hepatitis B Endocrine Patient has history of Type II Diabetes Denies history of Type I Diabetes Immunological Denies history of Lupus Erythematosus, Raynaudoos, Scleroderma Integumentary (Skin) Denies history of History of Burn Musculoskeletal Patient has history of Osteomyelitis - right foot Denies history of Gout, Rheumatoid Arthritis, Osteoarthritis Neurologic Patient has history of Dementia Denies history of Neuropathy, Quadriplegia, Paraplegia, Seizure Disorder Oncologic Denies history of Received Chemotherapy, Received Radiation Psychiatric Denies history of Anorexia/bulimia, Confinement Anxiety Patient is treated with Controlled Diet. Blood sugar is tested. Review of Systems (ROS) Constitutional Symptoms (General Health) Denies complaints or symptoms of Fatigue, Fever, Chills, Marked Weight Change. Eyes Denies complaints or symptoms of Dry Eyes, Vision Changes, Glasses / Contacts. Ear/Nose/Mouth/Throat Denies complaints or symptoms of Chronic sinus problems or rhinitis. Respiratory Denies complaints or symptoms of Chronic or frequent coughs, Shortness of Breath. Cardiovascular Denies complaints or  symptoms of Chest pain. Gastrointestinal Denies complaints or symptoms of Frequent diarrhea, Nausea, Vomiting. Endocrine Denies complaints or symptoms of Heat/cold intolerance. Genitourinary Denies complaints or symptoms of Frequent urination. Integumentary (Skin) Complains or has symptoms of Wounds. Musculoskeletal Denies complaints or symptoms of Muscle Pain, Muscle Weakness. Neurologic Denies complaints or symptoms of  Numbness/parasthesias. Psychiatric Denies complaints or symptoms of Claustrophobia, Suicidal. Objective Constitutional Sitting or standing Blood Pressure is within target range for patient.. Pulse regular and within target range for patient.[name] Respirations regular, non-labored and within target range.. Temperature is normal and within the target range for the patient.[name] Appears in no distress. Vitals Time Taken: 11:04 AM, Height: 73 in, Source: Stated, Weight: 150 lbs, Source: Stated, BMI: 19.8, Temperature: 98.7 F, Pulse: 60 bpm, Respiratory Rate: 18 breaths/min, Blood Pressure: 143/83 mmHg. Cardiovascular Femoral pulse on the left is easily palpable. Psychiatric appears at normal baseline. General Notes: Wound exam; the area in question is on the tip of the left AKA stump. This is a small wound but completely filled with exposed bone presumably femur. There is no surrounding obvious infection no purulent drainage. Integumentary (Hair, Skin) no erythema around the wound.. Wound #1 status is Open. Original cause of wound was Gradually Appeared. The wound is located on the Left Amputation Site - Above Knee. The wound measures 0.9cm length x 0.4cm width x 0.3cm depth; 0.283cm^2 area and 0.085cm^3 volume. There is bone exposed. There is no tunneling or undermining noted. There is a medium amount of serosanguineous drainage noted. There is large (67-100%) pink granulation within the wound bed. There is no necrotic tissue within the wound bed. 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 There was a Excisional Skin/Subcutaneous Tissue/Muscle/Bone Debridement with a total area of 0.25 sq cm performed by  Ricard Dillon., MD. With the following instrument(s): Curette, and Rongeur to remove Viable tissue/material. Material removed includes Bone,Subcutaneous Tissue and after achieving pain control using Lidocaine Injectable: 1%. A time out was conducted at 11:35, prior to the start of the procedure. A Moderate amount of bleeding was controlled with Silver Nitrate. The procedure was tolerated well with a pain level of 0 throughout and a pain level of 3 following the procedure. Post Debridement Measurements: 0.9cm length x 0.4cm width x 0.3cm depth; 0.085cm^3 volume. Post debridement Stage noted as Category/Stage IV. Character of Wound/Ulcer Post Debridement is improved. Post procedure Diagnosis Wound #1: Same as Pre-Procedure Plan Follow-up Appointments: Return Appointment in 1 week. Dressing Change Frequency: Change Dressing every other day. Skin Barriers/Peri-Wound Care: Skin Prep Wound Cleansing: May shower and wash wound with soap and water. - with dressing changes only. Primary Wound Dressing: Wound #1 Left Amputation Site - Above Knee: Endoform - pack into wound bed. Secondary Dressing: ABD pad - or foam border. The following medication(s) was prescribed: lidocaine HCl injection 10 mg/mL (1 %) solution solution injection 0.46mL injected into periwound for debridement by MD. was prescribed at facility [facility] I gave the patient's niece and the patient 3 options. T do absolutely nothing here other than the topical antibiotics and protective dressing the area using. o There would be absolutely no possibility of healing this however. 2. Second option would be to attempt to debride some of the bone and  the third to refer him back to orthopedics to consider a stump revision. They were not in favor of the latter. Therefore I went ahead and debrided the bone with rongeurs. There was a fair amount of bleeding I also removed some necrotic subcutaneous tissue. Hemostasis with silver nitrate.  Anesthesia was with injectable lidocaine I did a circular block. He seemed to tolerate this quite well 3. Even with the debridement outlined I am not at all certain that there will be healing here. There is still is not enough viable soft soft tissue to cover the area hopefully we can stimulate some. 4. We are going to use endoform/foam border ABD change every second day 5 I did not think that there was any evidence of infection here I did not send any of the bone for pathology or culture. It would be possible to do this should the situation change I spent 40 minutes in review of this patient's past medical history face-to-face evaluation and preparation of this record Electronic Signature(s) Signed: 07/07/2020 5:37:49 PM By: Linton Ham MD Entered By: Linton Ham on 07/07/2020 13:04:37 -------------------------------------------------------------------------------- HxROS Details Patient Name: Date of Service: [name] ME 07/07/2020 10:30 [place] Record Number: [id number] Patient Account Number: [id number] Date of Birth/Sex: Treating RN: [birth year] (69 y.o. [name] Primary Care Provider: [name] Other Clinician: Referring Provider: Treating Provider/Extender: [name] in Treatment: 0 Constitutional Symptoms (General Health) Complaints and Symptoms: Negative for: Fatigue; Fever; Chills; Marked Weight Change Eyes Complaints and Symptoms: Negative for: Dry Eyes; Vision Changes; Glasses / Contacts Medical History: Negative for: Cataracts; Glaucoma; Optic Neuritis Ear/Nose/Mouth/Throat Complaints and Symptoms: Negative for: Chronic sinus problems or rhinitis Medical History: Negative for: Chronic sinus problems/congestion; Middle ear problems Respiratory Complaints and Symptoms: Negative for: Chronic or frequent coughs; Shortness of Breath Medical History: Negative for: Aspiration; Asthma; Chronic Obstructive Pulmonary  Disease (COPD); Pneumothorax; Sleep Apnea; Tuberculosis Cardiovascular Complaints and Symptoms: Negative for: Chest pain Medical History: Positive for: Coronary Artery Disease; Hypertension; Peripheral Arterial Disease; Peripheral Venous Disease Negative for: Angina; Arrhythmia; Congestive Heart Failure; Deep Vein Thrombosis; Hypotension; Myocardial Infarction; Phlebitis; Vasculitis Gastrointestinal Complaints and Symptoms: Negative for: Frequent diarrhea; Nausea; Vomiting Medical History: Positive for: Hepatitis C Negative for: Cirrhosis ; Colitis; Crohns; Hepatitis A; Hepatitis B Endocrine Complaints and Symptoms: Negative for: Heat/cold intolerance Medical History: Positive for: Type II Diabetes Negative for: Type I Diabetes Time with diabetes: 7 years Treated with: Diet Blood sugar tested every day: Yes Tested : Genitourinary Complaints and Symptoms: Negative for: Frequent urination Integumentary (Skin) Complaints and Symptoms: Positive for: Wounds Medical History: Negative for: History of Burn Musculoskeletal Complaints and Symptoms: Negative for: Muscle Pain; Muscle Weakness Medical History: Positive for: Osteomyelitis - right foot Negative for: Gout; Rheumatoid Arthritis; Osteoarthritis Neurologic Complaints and Symptoms: Negative for: Numbness/parasthesias Medical History: Positive for: Dementia Negative for: Neuropathy; Quadriplegia; Paraplegia; Seizure Disorder Psychiatric Complaints and Symptoms: Negative for: Claustrophobia; Suicidal Medical History: Negative for: Anorexia/bulimia; Confinement Anxiety Hematologic/Lymphatic Medical History: Negative for: Anemia; Hemophilia; Human Immunodeficiency Virus; Lymphedema; Sickle Cell Disease Immunological Medical History: Negative for: Lupus Erythematosus; Raynauds; Scleroderma Oncologic Medical History: Negative for: Received Chemotherapy; Received Radiation Immunizations Pneumococcal Vaccine: Received  Pneumococcal Vaccination: No Implantable Devices None Family and Social History Cancer: Yes - Siblings; Diabetes: Yes - Siblings; Heart Disease: Yes - Siblings; Hereditary Spherocytosis: No; Hypertension: Yes - Siblings; Kidney Disease: Yes - Siblings; Lung Disease: No; Seizures: No; Stroke: No; Thyroid Problems: No; Tuberculosis: No; Former smoker; Marital Status - Single;  Alcohol Use: Never; Drug Use: Prior History; Caffeine Use: Moderate; Financial Concerns: No; Food, Clothing or Shelter Needs: No; Support System Lacking: No; Transportation Concerns: No Electronic Signature(s) Signed: 07/07/2020 5:37:49 PM By: Linton Ham MD Signed: 07/08/2020 5:00:24 PM By: [name] RN Entered By: [name] on 07/07/2020 11:13:15 -------------------------------------------------------------------------------- SuperBill Details Patient Name: Date of Service: [name] ME 07/07/2020 Medical Record Number: [id number] Patient Account Number: [id number] Date of Birth/Sex: Treating RN: 06-08-1951 (69 y.o. 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 Facility Procedures CPT4 Code: [id number] Description: 67014 - WOUND CARE VISIT-LEV 3 EST PT Modifier: Quantity: 1 CPT4 Code: [id number] Description: 38887 - DEB BONE 20 SQ CM/< ICD-10 Diagnosis Description 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 I70.241 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 Modifier: Quantity: 1 Physician Procedures CPT4: CPT4 Quantity Description Modifier Code [id number] WC PHYS LEVEL 3  NEW PT 25 1 ICD-10 Diagnosis Description 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 I70.241 Atherosclerosis of native arteries of left leg with ulceration  of thigh 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

## 2020-07-15 ENCOUNTER — Other Ambulatory Visit: Payer: Self-pay

## 2020-07-15 ENCOUNTER — Encounter (HOSPITAL_BASED_OUTPATIENT_CLINIC_OR_DEPARTMENT_OTHER): Payer: Medicare Other | Admitting: Internal Medicine

## 2020-07-15 DIAGNOSIS — T8789 Other complications of amputation stump: Secondary | ICD-10-CM | POA: Diagnosis not present

## 2020-07-18 NOTE — Progress Notes (Signed)
DEMERIUS, PODOLAK ([id number]) Visit Report for 07/15/2020 HPI Details Patient Name: Date of Service: [name] 07/15/2020 2:45 PM Medical Record Number: [id number] Patient Account Number: [id number] Date of Birth/Sex: Treating RN: Dec 15, 1950 (69 y.o. [name] Primary Care Provider: [name] Other Clinician: Referring Provider: Treating Provider/Extender: [name] in Treatment: 1 History of Present Illness HPI Description: Admission 07/07/2020 This is a 69 year old man who has severe PAD. 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 This was after an angiogram showed bilateral superficial femoral artery occlusion. No named vessels visualized in either lower legs with gangrenous wounds. The patient is here with his niece who says that about 2 months ago he developed a wound on his left AKA stump tip. Seen by orthopedic surgery on 05/06/2020 at [facility]. Noted at the time that he had some bony prominence out of wound in the distal femur. However at that time there was granulation in the wound bed. They recommended topical antibiotics and a covering dressing. Patient had previously seen his primary physician at [facility] and was put on Keflex in late August for 2 weeks. The patient describes some pain in the area but there is no purulent drainage she has not been systemically unwell. Past medical history includes bilateral AKA's in 2020 as noted, vascular dementia, atrial fib, history of alcohol abuse, chronic kidney disease stage IV. He is under comfort care at home but is not on hospice. 11/12; patient I admitted the clinic last week with an area on his left AKA stump. This had bone protruding through 100% of the wound surface. After discussion with the patient and his family I removed some of this with rongeurs to see if we can stimulate enough granulation tissue around the area to close  this over. We have been using endoform Electronic Signature(s) Signed: 07/18/2020 9:07:52 AM By: Linton Ham MD Entered By: Linton Ham on 07/15/2020 15:53:25 -------------------------------------------------------------------------------- Physical Exam Details Patient Name: Date of Service: [name] 07/15/2020 2:45 PM Medical Record Number: [id number] Patient Account Number: [id number] Date of Birth/Sex: Treating RN: [birth year] (69 y.o. [name] Primary Care Provider: [name] Other Clinician: Referring Provider: Treating Provider/Extender: [name] in Treatment: 1 Constitutional Sitting or standing Blood Pressure is within target range for patient.. Pulse regular and within target range for patient.[name] Respirations regular, non-labored and within target range.. Temperature is normal and within the target range for the patient.[name] Appears in no distress. Cardiovascular Femoral pulses palpable. Notes Wound exam; the area in question is on the tip of the left AKA stump. I removed bone from this last time and indeed he has a better looking granulated surface although there is still a hole in the middle of this with exposed bone. The latter part is not surprising. There is no evidence of infection no tenderness. I do not think anything needs to be cultured at this point. Electronic Signature(s) Signed: 07/18/2020 9:07:52 AM By: Linton Ham MD Entered By: Linton Ham on 07/15/2020 15:55:15 -------------------------------------------------------------------------------- Physician Orders Details Patient Name: Date of Service: [name] 07/15/2020 2:45 PM Medical Record Number: [id number] Patient Account Number: [id number] Date of Birth/Sex: Treating RN: [birth year] (69 y.o. 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 Dressing Change Frequency Change Dressing every other day. Skin Barriers/Peri-Wound Care Skin Prep Wound Cleansing May shower and wash wound with soap and water. - with dressing changes only. Primary Wound Dressing Wound #1 Left Amputation Site - Above Knee Endoform - pack into wound bed. Secondary Dressing ABD pad - or foam border. Electronic Signature(s) Signed: 07/15/2020 5:58:22 PM By: Baruch Gouty RN, BSN Signed: 07/18/2020 9:07:52 AM By: Linton Ham MD Entered By: Baruch Gouty on 07/15/2020 15:38:05 -------------------------------------------------------------------------------- Problem List Details Patient Name: Date of Service: [name] 07/15/2020 2:45 PM Medical Record Number: [id number] Patient Account Number: [id number] Date of Birth/Sex: Treating RN: 09/14/1950 (69 y.o. 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 [name] Primary Care Provider: [name] Other Clinician: Referring Provider: Treating Provider/Extender: [name] in Treatment: 1 Subjective History of Present Illness (HPI) Admission 07/07/2020 This is a 69 year old man who has severe PAD. 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 This was after an angiogram showed bilateral superficial femoral artery occlusion. No named vessels visualized in either lower legs with gangrenous wounds. The patient is here with his niece who says that about 2 months ago he developed a wound on his left AKA stump tip. Seen by orthopedic surgery on 05/06/2020 at [facility]. Noted at the time that he had some bony prominence out of wound in the distal femur. However at that time there was granulation in the wound bed. They recommended topical antibiotics and a covering dressing. Patient had previously seen his primary physician at [place] and was put on Keflex in late August for 2 weeks. The patient describes some pain in the area but there is no purulent drainage she has not been systemically unwell. Past medical history includes bilateral AKA's in 2020 as noted, vascular dementia, atrial fib, history of alcohol abuse, chronic kidney disease stage IV. He is under comfort care at home but is not on hospice. 11/12; patient I admitted the clinic last week with an area on his left AKA stump. This had bone protruding through 100% of the wound surface. After discussion with the patient and his family I removed some of this with rongeurs to see if we can stimulate  enough granulation tissue around the area to close this over. We have been using endoform Objective Constitutional Sitting or standing Blood Pressure is within target range for patient.. Pulse regular and within target range for patient.[name] Respirations regular, non-labored and within target range.. Temperature is normal and within the target  range for the patient.[name] Appears in no distress. Vitals Time Taken: 3:13 PM, Height: 73 in, Weight: 150 lbs, BMI: 19.8, Temperature: 98.4 F, Pulse: 57 bpm, Respiratory Rate: 16 breaths/min, Blood Pressure: 118/73 mmHg, Capillary Blood Glucose: 115 mg/dl. General Notes: glucose per pt report Cardiovascular Femoral pulses palpable. General Notes: Wound exam; the area in question is on the tip of the left AKA stump. I removed bone from this last time and indeed he has a better looking granulated surface although there is still a hole in the middle of this with exposed bone. The latter part is not surprising. There is no evidence of infection no tenderness. I do not think anything needs to be cultured at this point. Integumentary (Hair, Skin) Wound #1 status is Open. Original cause of wound was Gradually Appeared. The wound is located on the Left Amputation Site - Above Knee. The wound measures 0.7cm length x 0.7cm width x 0.4cm depth; 0.385cm^2 area and 0.154cm^3 volume. There is bone and Fat Layer (Subcutaneous Tissue) exposed. There is no tunneling noted, however, there is undermining starting at 9:00 and ending at 12:00 with a maximum distance of 1cm. There is a medium amount of serosanguineous drainage noted. The wound margin is distinct with the outline attached to the wound base. There is large (67-100%) pink granulation within the wound bed. There is a small (1-33%) amount of necrotic tissue within the wound bed including Adherent Slough. 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 Dressing Change Frequency: Change Dressing every other day. Skin Barriers/Peri-Wound Care: Skin Prep Wound Cleansing: May shower and wash wound with soap and water. - with dressing changes only. Primary Wound Dressing: Wound #1 Left Amputation Site - Above Knee: Endoform - pack into wound bed. Secondary Dressing: ABD pad - or foam border. 1. I am going to continue with the endoform 2. I was almost surprised with the degree of granulation around this area 3. May require debridement of the edges of the wound to see if we can get some tissue adherence Electronic Signature(s) Signed: 07/18/2020 9:07:52 AM By: Linton Ham MD Entered By: Linton Ham on 07/15/2020 15:56:04 -------------------------------------------------------------------------------- SuperBill Details Patient Name: Date of Service: [name] 07/15/2020 Medical Record Number: [id number] Patient Account Number: [id number] Date of Birth/Sex: Treating RN: 1951-02-07 (69 y.o. 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

## 2020-07-19 NOTE — Progress Notes (Signed)
[name] ([id number]) Visit Report for 07/15/2020 Arrival Information Details Patient Name: Date of Service: [name] ME 07/15/2020 2:45 PM Medical Record Number: [id number] Patient Account Number: [id number] Date of Birth/Sex: Treating RN: [name] (69 y.o. [name], [name] Primary Care [name]: [name] Other Clinician: Referring [name]: Treating [name]/Extender: [name] in Treatment: 1 Visit Information History Since Last Visit Added or deleted any medications: No Patient Arrived: Wheel Chair Any new allergies or adverse reactions: No Arrival Time: 15:12 Had a fall or experienced change in No Accompanied By: niece activities of daily living that may affect Transfer Assistance: None risk of falls: Patient Identification Verified: Yes Signs or symptoms of abuse/neglect since last visito No Secondary Verification Process Completed: Yes Hospitalized since last visit: No Patient Requires Transmission-Based Precautions: No Implantable device outside of the clinic excluding No Patient Has Alerts: No cellular tissue based products placed in the center since last visit: Has Dressing in Place as Prescribed: Yes Pain Present Now: No Electronic Signature(s) Signed: 07/18/2020 5:51:53 PM By: Levan Hurst RN, BSN Entered By: Levan Hurst on 07/15/2020 15:22:51 -------------------------------------------------------------------------------- Multi Wound Chart Details Patient Name: Date of Service: [name] [place] 07/15/2020 2:45 PM Medical Record Number: [id number] Patient Account Number: [id number] Date of Birth/Sex: Treating RN: [name] (69 y.o. [name] Primary Care [name]: [name] Other Clinician: Referring [name]: Treating [name]/Extender: [name] in Treatment: 1 Vital Signs Height(in): 73 Capillary Blood Glucose(mg/dl): 115 Weight(lbs):  150 Pulse(bpm): 14 Body Mass Index(BMI): 20 Blood Pressure(mmHg): 118/73 Temperature(F): 98.4 Respiratory Rate(breaths/min): 16 Photos: [1:No Photos Left Amputation Site - Above Knee] [N/A:N/A N/A] Wound Location: [1:Gradually Appeared] [N/A:N/A] Wounding Event: [1:Pressure Ulcer] [N/A:N/A] Primary Etiology: [1:Coronary Artery Disease,] [N/A:N/A] Comorbid History: [1:Hypertension, Peripheral Arterial Disease, Peripheral Venous Disease, Hepatitis C, Type II Diabetes, Osteomyelitis, Dementia 03/03/2020] [N/A:N/A] Date Acquired: [1:1] [N/A:N/A] Weeks of Treatment: [1:Open] [N/A:N/A] Wound Status: [1:0.7x0.7x0.4] [N/A:N/A] Measurements L x W x D (cm) [1:0.385] [N/A:N/A] A (cm) : rea [1:0.154] [N/A:N/A] Volume (cm) : [1:-36.00%] [N/A:N/A] % Reduction in A rea: [1:-81.20%] [N/A:N/A] % Reduction in Volume: [1:9] Starting Position 1 (o'clock): [1:12] Ending Position 1 (o'clock): [1:1] Maximum Distance 1 (cm): [1:Yes] [N/A:N/A] Undermining: [1:Category/Stage IV] [N/A:N/A] Classification: [1:Medium] [N/A:N/A] Exudate A mount: [1:Serosanguineous] [N/A:N/A] Exudate Type: [1:red, brown] [N/A:N/A] Exudate Color: [1:Distinct, outline attached] [N/A:N/A] Wound Margin: [1:Large (67-100%)] [N/A:N/A] Granulation A mount: [1:Pink] [N/A:N/A] Granulation Quality: [1:Small (1-33%)] [N/A:N/A] Necrotic A mount: [1:Fat Layer (Subcutaneous Tissue): Yes N/A] Exposed Structures: [1:Bone: Yes Fascia: No Tendon: No Muscle: No Joint: No None] [N/A:N/A] Treatment Notes Electronic Signature(s) Signed: 07/15/2020 5:58:22 PM By: Baruch Gouty RN, BSN Signed: 07/18/2020 9:07:52 AM By: Linton Ham MD Entered By: Linton Ham on 07/15/2020 15:52:30 -------------------------------------------------------------------------------- Multi-Disciplinary Care Plan Details Patient Name: Date of Service: [name] ME 07/15/2020 2:45 PM Medical Record Number: [id number] Patient Account Number:  [id number] Date of Birth/Sex: Treating RN: [name] (69 y.o. [name] Primary Care Laquilla Dault: [name] Other Clinician: Referring Kalandra Masters: Treating Azhia Siefken/Extender: [name] in Treatment: 1 Active Inactive Nutrition Nursing Diagnoses: Potential for alteratiion in Nutrition/Potential for imbalanced nutrition Goals: Patient/caregiver agrees to and verbalizes understanding of need to obtain nutritional consultation Date Initiated: 07/07/2020 Target Resolution Date: 08/12/2020 Goal Status: Active Interventions: Provide education on nutrition Treatment Activities: Patient referred to Primary Care Physician for further nutritional evaluation : 07/07/2020 Notes: Pain, Acute or Chronic Nursing Diagnoses: Pain, acute or chronic: actual or potential Potential alteration in comfort, pain Goals: Patient  will verbalize adequate pain control and receive pain control interventions during procedures as needed Date Initiated: 07/07/2020 Target Resolution Date: 08/12/2020 Goal Status: Active Interventions: Encourage patient to take pain medications as prescribed Provide education on pain management Reposition patient for comfort Treatment Activities: Administer pain control measures as ordered : 07/07/2020 Notes: Wound/Skin Impairment Nursing Diagnoses: Knowledge deficit related to ulceration/compromised skin integrity Goals: Patient/caregiver will verbalize understanding of skin care regimen Date Initiated: 07/07/2020 Target Resolution Date: 09/09/2020 Goal Status: Active Interventions: Assess patient/caregiver ability to obtain necessary supplies Assess patient/caregiver ability to perform ulcer/skin care regimen upon admission and as needed Provide education on ulcer and skin care Treatment Activities: Skin care regimen initiated : 07/07/2020 Topical wound management initiated : 07/07/2020 Notes: Electronic Signature(s) Signed:  07/15/2020 5:58:22 PM By: Baruch Gouty RN, BSN Entered By: Baruch Gouty on 07/15/2020 15:36:04 -------------------------------------------------------------------------------- Pain Assessment Details Patient Name: Date of Service: [name] ME 07/15/2020 2:45 PM Medical Record Number: [id number] Patient Account Number: [id number] Date of Birth/Sex: Treating RN: [birth year] [name] (69 y.o. Janyth Contes Primary Care Adine Heimann: [name] Other Clinician: Referring Glorimar Stroope: Treating Lucy Boardman/Extender: [name] in Treatment: 1 Active Problems Location of Pain Severity and Description of Pain Patient Has Paino No Site Locations Pain Management and Medication Current Pain Management: Electronic Signature(s) Signed: 07/18/2020 5:51:53 PM By: Levan Hurst RN, BSN Entered By: Levan Hurst on 07/15/2020 15:13:39 -------------------------------------------------------------------------------- Patient/Caregiver Education Details Patient Name: Date of Service: [name] ME 11/12/2021andnbsp2:45 PM Medical Record Number: [id number] Patient Account Number: [id number] Date of Birth/Gender: Treating RN: [name] (69 y.o. [name] Primary Care Physician: [name] Other Clinician: Referring Physician: Treating Physician/Extender: [name] in Treatment: 1 Education Assessment Education Provided To: Patient Education Topics Provided Infection: Methods: Explain/Verbal Responses: Reinforcements needed, State content correctly Wound/Skin Impairment: Methods: Explain/Verbal Responses: Reinforcements needed, State content correctly Electronic Signature(s) Signed: 07/15/2020 5:58:22 PM By: Baruch Gouty RN, BSN Entered By: Baruch Gouty on 07/15/2020 15:36:28 -------------------------------------------------------------------------------- Wound Assessment Details Patient  Name: Date of Service: [name] ME 07/15/2020 2:45 PM Medical Record Number: [id number] Patient Account Number: [id number] Date of Birth/Sex: Treating RN: [birth year] [name] (69 y.o. Janyth Contes Primary Care Jethro Radke: [name] Other Clinician: Referring Arieliz Latino: Treating Tyreesha Maharaj/Extender: [name] in Treatment: 1 Wound Status Wound Number: 1 Primary Pressure Ulcer Etiology: Wound Location: Left Amputation Site - Above Knee Wound Open Wounding Event: Gradually Appeared Status: Date Acquired: 03/03/2020 Comorbid Coronary Artery Disease, Hypertension, Peripheral Arterial Disease, Weeks Of Treatment: 1 History: Peripheral Venous Disease, Hepatitis C, Type II Diabetes, Clustered Wound: No Osteomyelitis, Dementia Wound Measurements Length: (cm) 0.7 Width: (cm) 0.7 Depth: (cm) 0.4 Area: (cm) 0.385 Volume: (cm) 0.154 % Reduction in Area: -36% % Reduction in Volume: -81.2% Epithelialization: None Tunneling: No Undermining: Yes Starting Position (o'clock): 9 Ending Position (o'clock): 12 Maximum Distance: (cm) 1 Wound Description Classification: Category/Stage IV Wound Margin: Distinct, outline attached Exudate Amount: Medium Exudate Type: Serosanguineous Exudate Color: red, brown Foul Odor After Cleansing: No Slough/Fibrino No Wound Bed Granulation Amount: Large (67-100%) Exposed Structure Granulation Quality: Pink Fascia Exposed: No Necrotic Amount: Small (1-33%) Fat Layer (Subcutaneous Tissue) Exposed: Yes Necrotic Quality: Adherent Slough Tendon Exposed: No Muscle Exposed: No Joint Exposed: No Bone Exposed: Yes Electronic Signature(s) Signed: 07/18/2020 5:51:53 PM By: Levan Hurst RN, BSN Entered By: Levan Hurst on 07/15/2020 15:20:25 -------------------------------------------------------------------------------- Vitals Details Patient Name: Date of Service: [name] ME 07/15/2020 2:45 PM Medical  Record Number:  [id number] Patient Account Number: [id number] Date of Birth/Sex: Treating RN: [birth year] (69 y.o. Janyth Contes Primary Care Sanyah Molnar: [name] Other Clinician: Referring Dareion Kneece: Treating Zoltan Genest/Extender: [name] in Treatment: 1 Vital Signs Time Taken: 15:13 Temperature (F): 98.4 Height (in): 73 Pulse (bpm): 57 Weight (lbs): 150 Respiratory Rate (breaths/min): 16 Body Mass Index (BMI): 19.8 Blood Pressure (mmHg): 118/73 Capillary Blood Glucose (mg/dl): 115 Reference Range: 80 - 120 mg / dl Notes glucose per pt report Electronic Signature(s) Signed: 07/18/2020 5:51:53 PM By: Levan Hurst RN, BSN Entered By: Levan Hurst on 07/15/2020 15:13:34

## 2020-08-05 ENCOUNTER — Other Ambulatory Visit: Payer: Self-pay

## 2020-08-05 ENCOUNTER — Encounter (HOSPITAL_BASED_OUTPATIENT_CLINIC_OR_DEPARTMENT_OTHER): Payer: Medicare Other | Attending: Internal Medicine | Admitting: Internal Medicine

## 2020-08-05 DIAGNOSIS — I70241 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: Secondary | ICD-10-CM | POA: Insufficient documentation

## 2020-08-05 DIAGNOSIS — L97124 Non-pressure chronic ulcer of left thigh with necrosis of bone: Secondary | ICD-10-CM | POA: Diagnosis not present

## 2020-08-05 DIAGNOSIS — Z89611 Acquired absence of right leg above knee: Secondary | ICD-10-CM | POA: Diagnosis not present

## 2020-08-05 DIAGNOSIS — Z89612 Acquired absence of left leg above knee: Secondary | ICD-10-CM | POA: Insufficient documentation

## 2020-08-05 DIAGNOSIS — I739 Peripheral vascular disease, unspecified: Secondary | ICD-10-CM | POA: Diagnosis present

## 2020-08-05 NOTE — Progress Notes (Signed)
ABIR, CRAINE ([id number]) Visit Report for 08/05/2020 HPI Details Patient Name: Date of Service: [name] 08/05/2020 2:00 PM Medical Record Number: [id number] Patient Account Number: [id number] Date of Birth/Sex: Treating RN: Feb 05, 1951 (69 y.o. [name] Primary Care Provider: [name] Other Clinician: Referring Provider: Treating Provider/Extender: [name] in Treatment: 4 History of Present Illness HPI Description: Admission 07/07/2020 This is a 69 year old man who has severe PAD. 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 This was after an angiogram showed bilateral superficial femoral artery occlusion. No named vessels visualized in either lower legs with gangrenous wounds. The patient is here with his niece who says that about 2 months ago he developed a wound on his left AKA stump tip. Seen by orthopedic surgery on 05/06/2020 at [facility]. Noted at the time that he had some bony prominence out of wound in the distal femur. However at that time there was granulation in the wound bed. They recommended topical antibiotics and a covering dressing. Patient had previously seen his primary physician at [facility] and was put on Keflex in late August for 2 weeks. The patient describes some pain in the area but there is no purulent drainage she has not been systemically unwell. Past medical history includes bilateral AKA's in 2020 as noted, vascular dementia, atrial fib, history of alcohol abuse, chronic kidney disease stage IV. He is under comfort care at home but is not on hospice. 11/12; patient I admitted the clinic last week with an area on his left AKA stump. This had bone protruding through 100% of the wound surface. After discussion with the patient and his family I removed some of this with rongeurs to see if we can stimulate enough granulation tissue around the area to close  this over. We have been using endoform 12/3; wound on his left AKA stump initially 100% bone. I removed some of the bone he has been managing to get granulation tissue over this there is only a small area of the remains today. Electronic Signature(s) Signed: 08/05/2020 5:12:12 PM By: Linton Ham MD Entered By: Linton Ham on 08/05/2020 15:19:46 -------------------------------------------------------------------------------- Physical Exam Details Patient Name: Date of Service: [name] 08/05/2020 2:00 PM Medical Record Number: [id number] Patient Account Number: [id number] Date of Birth/Sex: Treating RN: Jul 16, 1951 (69 y.o. [name] Primary Care Provider: [name] Other Clinician: Referring Provider: Treating Provider/Extender: [name] in Treatment: 4 Constitutional Patient is hypertensive.. Pulse regular and within target range for patient.[name] Respirations regular, non-labored and within target range.. Temperature is normal and within the target range for the patient.[name] Appears in no distress. Notes Wound exam; the area in question is on the tip of the left AKA stump. Only a very small open area remains measuring millimeters in diameter. There is no palpable bone no evidence of surrounding infection. Electronic Signature(s) Signed: 08/05/2020 5:12:12 PM By: Linton Ham MD Entered By: Linton Ham on 08/05/2020 15:20:21 -------------------------------------------------------------------------------- Physician Orders Details Patient Name: Date of Service: [name] 08/05/2020 2:00 PM Medical Record Number: [id number] Patient Account Number: [id number] Date of Birth/Sex: Treating RN: 10-14-1950 (69 y.o. 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 Bathing/ Shower/ Hygiene May shower and wash wound with soap and water. Wound Treatment Wound #1 - Amputation Site - Above Knee Wound Laterality: Left Prim Dressing: KerraCel Ag Gelling Fiber Dressing, 2x2 in (silver alginate) Every Other Day ary Discharge Instructions: Apply silver alginate to wound bed as instructed Secondary Dressing: ComfortFoam Border, 3x3 in (silicone border) Every Other Day Discharge Instructions: Apply over primary dressing as directed. Electronic Signature(s) Signed: 08/05/2020 5:01:53 PM By: Baruch Gouty RN, BSN Signed: 08/05/2020 5:12:12 PM By: Linton Ham MD Entered By: Baruch Gouty on 08/05/2020 14:58:54 -------------------------------------------------------------------------------- Problem List Details Patient Name: Date of Service: [name] [place] 08/05/2020 2:00 PM Medical Record Number: [id number] Patient Account Number: [id number] Date of Birth/Sex: Treating RN: 1951/01/07 (69 y.o. 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 [name] Primary Care Provider: [name] Other Clinician: Referring Provider: Treating Provider/Extender: [name] in Treatment: 4 Subjective History of Present Illness (HPI) Admission 07/07/2020 This is a 69 year old man who has severe PAD. 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 This was after an angiogram showed bilateral superficial femoral artery occlusion. No named vessels visualized in either lower legs with gangrenous wounds. The patient is here with his niece who says that about 2 months ago he developed a wound on his left AKA stump tip. Seen by orthopedic surgery on 05/06/2020 at [facility]. Noted at the time that he had some bony prominence out of wound in the distal femur. However at that time there was granulation in the wound bed. They recommended topical antibiotics and a covering dressing. Patient had previously seen his primary physician at [place] and was put on Keflex in late August for 2 weeks. The patient describes some pain in the area but there is no purulent drainage she has not been systemically unwell. Past medical history includes bilateral AKA's in 2020 as noted, vascular dementia, atrial fib, history of alcohol abuse, chronic kidney disease stage IV. He is under comfort care at home but is not on hospice. 11/12; patient I admitted the clinic last week with an area on his left AKA stump. This had bone protruding through 100% of the wound surface. After discussion with the patient and his family I removed some of this with rongeurs to see if we can  stimulate enough granulation tissue around the area to close this over. We have been using endoform 12/3; wound on his left AKA stump initially 100% bone. I removed some of the bone he has been managing to get granulation tissue over this there is only a small area of the remains today.  Objective Constitutional Patient is hypertensive.. Pulse regular and within target range for patient.[name] Respirations regular, non-labored and within target range.. Temperature is normal and within the target range for the patient.[name] Appears in no distress. Vitals Time Taken: 2:14 PM, Height: 73 in, Weight: 150 lbs, BMI: 19.8, Temperature: 98.3 F, Pulse: 51 bpm, Respiratory Rate: 16 breaths/min, Blood Pressure: 145/71 mmHg. General Notes: Wound exam; the area in question is on the tip of the left AKA stump. Only a very small open area remains measuring millimeters in diameter. There is no palpable bone no evidence of surrounding infection. Integumentary (Hair, Skin) Wound #1 status is Open. Original cause of wound was Gradually Appeared. The wound is located on the Left Amputation Site - Above Knee. The wound measures 0.1cm length x 0.1cm width x 0.2cm depth; 0.008cm^2 area and 0.002cm^3 volume. There is bone and Fat Layer (Subcutaneous Tissue) exposed. There is no undermining noted, however, there is tunneling at 1:00 with a maximum distance of 0.8cm. There is a medium amount of serosanguineous drainage noted. The wound margin is distinct with the outline attached to the wound base. There is large (67-100%) pink granulation within the wound bed. There is a small (1-33%) amount of necrotic tissue within the wound bed including Adherent Slough. 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 Bathing/ Shower/ Hygiene: May shower and wash wound with soap and water. WOUND #1: - Amputation Site - Above Knee Wound Laterality: Left Prim Dressing: KerraCel Ag Gelling Fiber Dressing, 2x2 in (silver alginate) Every Other Day/ ary Discharge Instructions: Apply silver alginate to wound bed as instructed Secondary Dressing: ComfortFoam Border, 3x3 in (silicone border) Every Other Day/ Discharge Instructions: Apply over primary dressing as directed. #1 I recommended silver alginate instead of endoform. Hopefully to dry this wound and get final epithelialization. 2. No further debridement was necessary Electronic Signature(s) Signed: 08/05/2020 5:12:12 PM By: Linton Ham MD Entered By: Linton Ham on 08/05/2020 15:21:36 -------------------------------------------------------------------------------- SuperBill Details Patient Name: Date of Service: [name] 08/05/2020 Medical Record Number: [id number] Patient Account Number: [id number] Date of Birth/Sex: Treating RN: 1950-09-29 (69 y.o. 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

## 2020-08-09 NOTE — Progress Notes (Signed)
BRYANT, SAYE ([id number]) Visit Report for 08/05/2020 Arrival Information Details Patient Name: Date of Service: [name] [place] 08/05/2020 2:00 PM Medical Record Number: [id number] Patient Account Number: [id number] Date of Birth/Sex: Treating RN: 04-Oct-1950 (69 y.o. [name] Primary Care Muneer Leider: [name] Other Clinician: Referring Kytzia Gienger: Treating Jacorie Ernsberger/Extender: [name] in Treatment: 4 Visit Information History Since Last Visit Added or deleted any medications: No Patient Arrived: Wheel Chair Any new allergies or adverse reactions: No Arrival Time: 14:14 Had a fall or experienced change in No Accompanied By: daughter activities of daily living that may affect Transfer Assistance: None risk of falls: Patient Identification Verified: Yes Signs or symptoms of abuse/neglect since last visito No Secondary Verification Process Completed: Yes Hospitalized since last visit: No Patient Requires Transmission-Based Precautions: No Implantable device outside of the clinic excluding No Patient Has Alerts: No cellular tissue based products placed in the center since last visit: Has Dressing in Place as Prescribed: Yes Pain Present Now: No Electronic Signature(s) Signed: 08/09/2020 1:53:43 PM By: Sandre Kitty Entered By: Sandre Kitty on 08/05/2020 14:14:51 -------------------------------------------------------------------------------- Clinic Level of Care Assessment Details Patient Name: Date of Service: [name] ME 08/05/2020 2:00 PM Medical Record Number: [id number] Patient Account Number: [id number] Date of Birth/Sex: Treating RN: May 27, 1951 (69 y.o. [name] Primary Care Donyell Ding: [name] Other Clinician: Referring Sadiya Durand: Treating Tazia Illescas/Extender: [name] in Treatment: 4 Clinic Level of Care Assessment Items TOOL 4 Quantity Score []  - 0 Use when  only an EandM is performed on FOLLOW-UP visit ASSESSMENTS - Nursing Assessment / Reassessment X- 1 10 Reassessment of Co-morbidities (includes updates in patient status) X- 1 5 Reassessment of Adherence to Treatment Plan ASSESSMENTS - Wound and Skin A ssessment / Reassessment X - Simple Wound Assessment / Reassessment - one wound 1 5 []  - 0 Complex Wound Assessment / Reassessment - multiple wounds []  - 0 Dermatologic / Skin Assessment (not related to wound area) ASSESSMENTS - Focused Assessment []  - 0 Circumferential Edema Measurements - multi extremities []  - 0 Nutritional Assessment / Counseling / Intervention []  - 0 Lower Extremity Assessment (monofilament, tuning fork, pulses) []  - 0 Peripheral Arterial Disease Assessment (using hand held doppler) ASSESSMENTS - Ostomy and/or Continence Assessment and Care []  - 0 Incontinence Assessment and Management []  - 0 Ostomy Care Assessment and Management (repouching, etc.) PROCESS - Coordination of Care X - Simple Patient / Family Education for ongoing care 1 15 []  - 0 Complex (extensive) Patient / Family Education for ongoing care X- 1 10 Staff obtains Programmer, systems, Records, T Results / Process Orders est []  - 0 Staff telephones HHA, Nursing Homes / Clarify orders / etc []  - 0 Routine Transfer to another Facility (non-emergent condition) []  - 0 Routine Hospital Admission (non-emergent condition) []  - 0 New Admissions / Biomedical engineer / Ordering NPWT Apligraf, etc. , []  - 0 Emergency Hospital Admission (emergent condition) []  - 0 Simple Discharge Coordination []  - 0 Complex (extensive) Discharge Coordination PROCESS - Special Needs []  - 0 Pediatric / Minor Patient Management []  - 0 Isolation Patient Management []  - 0 Hearing / Language / Visual special needs []  - 0 Assessment of Community assistance (transportation, D/C planning, etc.) []  - 0 Additional assistance / Altered mentation []  - 0 Support  Surface(s) Assessment (bed, cushion, seat, etc.) INTERVENTIONS - Wound Cleansing / Measurement X - Simple Wound Cleansing - one wound 1 5 []  - 0 Complex Wound Cleansing - multiple wounds  X- 1 5 Wound Imaging (photographs - any number of wounds) []  - 0 Wound Tracing (instead of photographs) X- 1 5 Simple Wound Measurement - one wound []  - 0 Complex Wound Measurement - multiple wounds INTERVENTIONS - Wound Dressings X - Small Wound Dressing one or multiple wounds 1 10 []  - 0 Medium Wound Dressing one or multiple wounds []  - 0 Large Wound Dressing one or multiple wounds X- 1 5 Application of Medications - topical []  - 0 Application of Medications - injection INTERVENTIONS - Miscellaneous []  - 0 External ear exam []  - 0 Specimen Collection (cultures, biopsies, blood, body fluids, etc.) []  - 0 Specimen(s) / Culture(s) sent or taken to Lab for analysis []  - 0 Patient Transfer (multiple staff / Civil Service fast streamer / Similar devices) []  - 0 Simple Staple / Suture removal (25 or less) []  - 0 Complex Staple / Suture removal (26 or more) []  - 0 Hypo / Hyperglycemic Management (close monitor of Blood Glucose) []  - 0 Ankle / Brachial Index (ABI) - do not check if billed separately X- 1 5 Vital Signs Has the patient been seen at the hospital within the last three years: Yes Total Score: 80 Level Of Care: New/Established - Level 3 Electronic Signature(s) Signed: 08/05/2020 5:01:53 PM By: Baruch Gouty RN, BSN Entered By: Baruch Gouty on 08/05/2020 14:57:12 -------------------------------------------------------------------------------- Lower Extremity Assessment Details Patient Name: Date of Service: [name] [place] 08/05/2020 2:00 PM Medical Record Number: [id number] Patient Account Number: [id number] Date of Birth/Sex: Treating RN: 10-08-1950 (69 y.o. Hessie Diener Primary Care Brodin Gelpi: [name] Other Clinician: Referring Laelia Angelo: Treating Harsimran Westman/Extender:  [name] in Treatment: 4 Electronic Signature(s) Signed: 08/05/2020 5:19:29 PM By: Deon Pilling Entered By: Deon Pilling on 08/05/2020 14:20:44 -------------------------------------------------------------------------------- Multi Wound Chart Details Patient Name: Date of Service: [name] [place] Number Two 08/05/2020 2:00 PM Medical Record Number: [id number] Patient Account Number: [id number] Date of Birth/Sex: Treating RN: 21-Jul-1951 (69 y.o. [name] Primary Care Jori Frerichs: [name] Other Clinician: Referring Richetta Cubillos: Treating Daquon Greenleaf/Extender: [name] in Treatment: 4 Vital Signs Height(in): 73 Pulse(bpm): 51 Weight(lbs): 150 Blood Pressure(mmHg): 145/71 Body Mass Index(BMI): 20 Temperature(F): 98.3 Respiratory Rate(breaths/min): 16 Photos: [1:No Photos Left Amputation Site - Above Knee] [N/A:N/A N/A] Wound Location: [1:Gradually Appeared] [N/A:N/A] Wounding Event: [1:Pressure Ulcer] [N/A:N/A] Primary Etiology: [1:Coronary Artery Disease,] [N/A:N/A] Comorbid History: [1:Hypertension, Peripheral Arterial Disease, Peripheral Venous Disease, Hepatitis C, Type II Diabetes, Osteomyelitis, Dementia 03/03/2020] [N/A:N/A] Date Acquired: [1:4] [N/A:N/A] Weeks of Treatment: [1:Open] [N/A:N/A] Wound Status: [1:0.1x0.1x0.2] [N/A:N/A] Measurements L x W x D (cm) [1:0.008] [N/A:N/A] A (cm) : rea [1:0.002] [N/A:N/A] Volume (cm) : [1:97.20%] [N/A:N/A] % Reduction in Area: [1:97.60%] [N/A:N/A] % Reduction in Volume: [1:1] Position 1 (o'clock): [1:0.8] Maximum Distance 1 (cm): [1:Yes] [N/A:N/A] Tunneling: [1:Category/Stage IV] [N/A:N/A] Classification: [1:Medium] [N/A:N/A] Exudate A mount: [1:Serosanguineous] [N/A:N/A] Exudate Type: [1:red, brown] [N/A:N/A] Exudate Color: [1:Distinct, outline attached] [N/A:N/A] Wound Margin: [1:Large (67-100%)] [N/A:N/A] Granulation A mount: [1:Pink] [N/A:N/A] Granulation  Quality: [1:Small (1-33%)] [N/A:N/A] Necrotic A mount: [1:Fat Layer (Subcutaneous Tissue): Yes N/A] Exposed Structures: [1:Bone: Yes Fascia: No Tendon: No Muscle: No Joint: No None] [N/A:N/A] Treatment Notes Electronic Signature(s) Signed: 08/05/2020 5:01:53 PM By: Baruch Gouty RN, BSN Signed: 08/05/2020 5:12:12 PM By: Linton Ham MD Entered By: Linton Ham on 08/05/2020 15:19:09 -------------------------------------------------------------------------------- Multi-Disciplinary Care Plan Details Patient Name: Date of Service: [name] [place] 08/05/2020 2:00 PM Medical Record Number: [id number] Patient Account Number: [id number] Date of Birth/Sex: Treating RN: 1951-05-10 (  69 y.o. [name] Primary Care Margaret Cockerill: [name] Other Clinician: Referring Claudie Brickhouse: Treating Hermann Dottavio/Extender: [name] in Treatment: 4 Active Inactive Nutrition Nursing Diagnoses: Potential for alteratiion in Nutrition/Potential for imbalanced nutrition Goals: Patient/caregiver agrees to and verbalizes understanding of need to obtain nutritional consultation Date Initiated: 07/07/2020 Target Resolution Date: 08/12/2020 Goal Status: Active Interventions: Provide education on nutrition Treatment Activities: Patient referred to Primary Care Physician for further nutritional evaluation : 07/07/2020 Notes: Pain, Acute or Chronic Nursing Diagnoses: Pain, acute or chronic: actual or potential Potential alteration in comfort, pain Goals: Patient will verbalize adequate pain control and receive pain control interventions during procedures as needed Date Initiated: 07/07/2020 Target Resolution Date: 08/12/2020 Goal Status: Active Interventions: Encourage patient to take pain medications as prescribed Provide education on pain management Reposition patient for comfort Treatment Activities: Administer pain control measures as ordered :  07/07/2020 Notes: Wound/Skin Impairment Nursing Diagnoses: Knowledge deficit related to ulceration/compromised skin integrity Goals: Patient/caregiver will verbalize understanding of skin care regimen Date Initiated: 07/07/2020 Target Resolution Date: 09/09/2020 Goal Status: Active Interventions: Assess patient/caregiver ability to obtain necessary supplies Assess patient/caregiver ability to perform ulcer/skin care regimen upon admission and as needed Provide education on ulcer and skin care Treatment Activities: Skin care regimen initiated : 07/07/2020 Topical wound management initiated : 07/07/2020 Notes: Electronic Signature(s) Signed: 08/05/2020 5:01:53 PM By: Baruch Gouty RN, BSN Entered By: Baruch Gouty on 08/05/2020 14:56:29 -------------------------------------------------------------------------------- Pain Assessment Details Patient Name: Date of Service: [name] [place] 08/05/2020 2:00 PM Medical Record Number: [id number] Patient Account Number: [id number] Date of Birth/Sex: Treating RN: July 19, 1951 (69 y.o. [name] Primary Care Vaniyah Lansky: [name] Other Clinician: Referring Jullisa Grigoryan: Treating Neya Creegan/Extender: [name] in Treatment: 4 Active Problems Location of Pain Severity and Description of Pain Patient Has Paino No Site Locations Pain Management and Medication Current Pain Management: Electronic Signature(s) Signed: 08/05/2020 5:01:53 PM By: Baruch Gouty RN, BSN Signed: 08/09/2020 1:53:43 PM By: Sandre Kitty Entered By: Sandre Kitty on 08/05/2020 14:15:37 -------------------------------------------------------------------------------- Patient/Caregiver Education Details Patient Name: Date of Service: [name] [place] 12/3/2021andnbsp2:00 PM Medical Record Number: [id number] Patient Account Number: [id number] Date of Birth/Gender: Treating RN: 04-08-1951 (69 y.o. [name] Primary Care Physician: [name] Other Clinician: Referring Physician: Treating Physician/Extender: [name] in Treatment: 4 Education Assessment Education Provided To: Patient Education Topics Provided Wound/Skin Impairment: Methods: Explain/Verbal Responses: Reinforcements needed, State content correctly Electronic Signature(s) Signed: 08/05/2020 5:01:53 PM By: Baruch Gouty RN, BSN Entered By: Baruch Gouty on 08/05/2020 14:56:43 -------------------------------------------------------------------------------- Wound Assessment Details Patient Name: Date of Service: [name] [place] 08/05/2020 2:00 PM Medical Record Number: [id number] Patient Account Number: [id number] Date of Birth/Sex: Treating RN: 01-17-1951 (69 y.o. 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 Wound Open Wounding Event: Gradually Appeared Status: Date Acquired: 03/03/2020 Comorbid Coronary Artery Disease, Hypertension, Peripheral Arterial Disease, Weeks Of Treatment: 4 History: Peripheral Venous Disease, Hepatitis C, Type II Diabetes, Clustered Wound: No Osteomyelitis, Dementia Photos Photo Uploaded By: Mikeal Hawthorne on 08/09/2020 13:21:59 Wound Measurements Length: (cm) 0.1 Width: (cm) 0.1 Depth: (cm) 0.2 Area: (cm) 0.008 Volume: (cm) 0.002 % Reduction in Area: 97.2% % Reduction in Volume: 97.6% Epithelialization: None Tunneling: Yes Position (o'clock): 1 Maximum Distance: (cm) 0.8 Undermining: No Wound Description Classification:  Category/Stage IV Wound Margin: Distinct, outline attached Exudate Amount: Medium Exudate Type: Serosanguineous Exudate Color: red, brown Foul Odor After  Cleansing: No Slough/Fibrino Yes Wound Bed Granulation Amount: Large (67-100%) Exposed Structure Granulation Quality: Pink Fascia Exposed: No Necrotic Amount: Small (1-33%) Fat Layer (Subcutaneous Tissue) Exposed: Yes Necrotic Quality: Adherent Slough Tendon Exposed: No Muscle Exposed: No Joint Exposed: No Bone Exposed: Yes Electronic Signature(s) Signed: 08/05/2020 5:01:53 PM By: Baruch Gouty RN, BSN Signed: 08/05/2020 5:19:29 PM By: Deon Pilling Entered By: Deon Pilling on 08/05/2020 14:21:15 -------------------------------------------------------------------------------- Vitals Details Patient Name: Date of Service: [name] [place] 08/05/2020 2:00 PM Medical Record Number: [id number] Patient Account Number: [id number] Date of Birth/Sex: Treating RN: [birth year] (69 y.o. [name] Primary Care Chanse Kagel: [name] Other Clinician: Referring Shubham Thackston: Treating Leston Schueller/Extender: [name] in Treatment: 4 Vital Signs Time Taken: 14:14 Temperature (F): 98.3 Height (in): 73 Pulse (bpm): 51 Weight (lbs): 150 Respiratory Rate (breaths/min): 16 Body Mass Index (BMI): 19.8 Blood Pressure (mmHg): 145/71 Reference Range: 80 - 120 mg / dl Electronic Signature(s) Signed: 08/09/2020 1:53:43 PM By: Sandre Kitty Entered By: Sandre Kitty on 08/05/2020 14:15:30

## 2020-08-19 ENCOUNTER — Encounter (HOSPITAL_BASED_OUTPATIENT_CLINIC_OR_DEPARTMENT_OTHER): Payer: Medicare Other | Admitting: Internal Medicine

## 2020-09-30 ENCOUNTER — Encounter (HOSPITAL_BASED_OUTPATIENT_CLINIC_OR_DEPARTMENT_OTHER): Payer: Medicare Other | Admitting: Internal Medicine

## 2020-10-07 ENCOUNTER — Encounter (HOSPITAL_BASED_OUTPATIENT_CLINIC_OR_DEPARTMENT_OTHER): Payer: Medicare Other | Attending: Internal Medicine | Admitting: Internal Medicine

## 2020-12-24 DIAGNOSIS — E119 Type 2 diabetes mellitus without complications: Secondary | ICD-10-CM | POA: Diagnosis not present

## 2020-12-24 DIAGNOSIS — Z136 Encounter for screening for cardiovascular disorders: Secondary | ICD-10-CM | POA: Diagnosis not present

## 2020-12-24 DIAGNOSIS — L989 Disorder of the skin and subcutaneous tissue, unspecified: Secondary | ICD-10-CM | POA: Diagnosis not present

## 2020-12-24 DIAGNOSIS — L8992 Pressure ulcer of unspecified site, stage 2: Secondary | ICD-10-CM | POA: Diagnosis not present

## 2020-12-24 DIAGNOSIS — Z7189 Other specified counseling: Secondary | ICD-10-CM | POA: Diagnosis not present

## 2020-12-24 DIAGNOSIS — L89152 Pressure ulcer of sacral region, stage 2: Secondary | ICD-10-CM | POA: Diagnosis not present

## 2020-12-24 DIAGNOSIS — Z0001 Encounter for general adult medical examination with abnormal findings: Secondary | ICD-10-CM | POA: Diagnosis not present

## 2020-12-24 DIAGNOSIS — D638 Anemia in other chronic diseases classified elsewhere: Secondary | ICD-10-CM | POA: Diagnosis not present

## 2020-12-24 DIAGNOSIS — Z1211 Encounter for screening for malignant neoplasm of colon: Secondary | ICD-10-CM | POA: Diagnosis not present

## 2020-12-24 DIAGNOSIS — I4891 Unspecified atrial fibrillation: Secondary | ICD-10-CM | POA: Diagnosis not present

## 2021-01-05 ENCOUNTER — Other Ambulatory Visit: Payer: Self-pay

## 2021-01-05 ENCOUNTER — Emergency Department (HOSPITAL_COMMUNITY): Payer: Medicare Other

## 2021-01-05 ENCOUNTER — Emergency Department (HOSPITAL_COMMUNITY)
Admission: EM | Admit: 2021-01-05 | Discharge: 2021-01-06 | Disposition: A | Discharge status: Home / Self Care | Payer: Medicare Other | Attending: Emergency Medicine | Admitting: Emergency Medicine

## 2021-01-05 DIAGNOSIS — W050XXA Fall from non-moving wheelchair, initial encounter: Secondary | ICD-10-CM | POA: Insufficient documentation

## 2021-01-05 DIAGNOSIS — I2510 Atherosclerotic heart disease of native coronary artery without angina pectoris: Secondary | ICD-10-CM | POA: Diagnosis not present

## 2021-01-05 DIAGNOSIS — Z23 Encounter for immunization: Secondary | ICD-10-CM | POA: Diagnosis not present

## 2021-01-05 DIAGNOSIS — R404 Transient alteration of awareness: Secondary | ICD-10-CM | POA: Diagnosis not present

## 2021-01-05 DIAGNOSIS — Z7982 Long term (current) use of aspirin: Secondary | ICD-10-CM | POA: Diagnosis not present

## 2021-01-05 DIAGNOSIS — Z87891 Personal history of nicotine dependence: Secondary | ICD-10-CM | POA: Insufficient documentation

## 2021-01-05 DIAGNOSIS — Z743 Need for continuous supervision: Secondary | ICD-10-CM | POA: Diagnosis not present

## 2021-01-05 DIAGNOSIS — R9082 White matter disease, unspecified: Secondary | ICD-10-CM | POA: Diagnosis not present

## 2021-01-05 DIAGNOSIS — F0390 Unspecified dementia without behavioral disturbance: Secondary | ICD-10-CM | POA: Diagnosis not present

## 2021-01-05 DIAGNOSIS — I480 Paroxysmal atrial fibrillation: Secondary | ICD-10-CM | POA: Diagnosis not present

## 2021-01-05 DIAGNOSIS — Z7901 Long term (current) use of anticoagulants: Secondary | ICD-10-CM | POA: Diagnosis not present

## 2021-01-05 DIAGNOSIS — I6381 Other cerebral infarction due to occlusion or stenosis of small artery: Secondary | ICD-10-CM | POA: Diagnosis not present

## 2021-01-05 DIAGNOSIS — R6889 Other general symptoms and signs: Secondary | ICD-10-CM | POA: Diagnosis not present

## 2021-01-05 DIAGNOSIS — N184 Chronic kidney disease, stage 4 (severe): Secondary | ICD-10-CM | POA: Insufficient documentation

## 2021-01-05 DIAGNOSIS — R58 Hemorrhage, not elsewhere classified: Secondary | ICD-10-CM | POA: Diagnosis not present

## 2021-01-05 DIAGNOSIS — S0181XA Laceration without foreign body of other part of head, initial encounter: Secondary | ICD-10-CM | POA: Diagnosis not present

## 2021-01-05 DIAGNOSIS — I509 Heart failure, unspecified: Secondary | ICD-10-CM | POA: Insufficient documentation

## 2021-01-05 DIAGNOSIS — I130 Hypertensive heart and chronic kidney disease with heart failure and stage 1 through stage 4 chronic kidney disease, or unspecified chronic kidney disease: Secondary | ICD-10-CM | POA: Insufficient documentation

## 2021-01-05 DIAGNOSIS — Z955 Presence of coronary angioplasty implant and graft: Secondary | ICD-10-CM | POA: Insufficient documentation

## 2021-01-05 DIAGNOSIS — W19XXXA Unspecified fall, initial encounter: Secondary | ICD-10-CM | POA: Diagnosis not present

## 2021-01-05 DIAGNOSIS — S0990XA Unspecified injury of head, initial encounter: Secondary | ICD-10-CM | POA: Diagnosis present

## 2021-01-05 DIAGNOSIS — S0231XA Fracture of orbital floor, right side, initial encounter for closed fracture: Secondary | ICD-10-CM | POA: Diagnosis not present

## 2021-01-05 MED ORDER — TETANUS-DIPHTH-ACELL PERTUSSIS 5-2.5-18.5 LF-MCG/0.5 IM SUSY
0.5000 mL | PREFILLED_SYRINGE | Freq: Once | INTRAMUSCULAR | Status: AC
  Administered 2021-01-05: 0.5 mL via INTRAMUSCULAR
  Filled 2021-01-05: qty 0.5

## 2021-01-05 MED ORDER — BACITRACIN ZINC 500 UNIT/GM EX OINT
TOPICAL_OINTMENT | Freq: Once | CUTANEOUS | Status: AC
  Administered 2021-01-06: 1 via TOPICAL
  Filled 2021-01-05: qty 0.9

## 2021-01-05 MED ORDER — LIDOCAINE-EPINEPHRINE (PF) 2 %-1:200000 IJ SOLN
10.0000 mL | Freq: Once | INTRAMUSCULAR | Status: AC
  Administered 2021-01-05: 10 mL via INTRADERMAL
  Filled 2021-01-05: qty 20

## 2021-01-05 NOTE — ED Provider Notes (Signed)
Emergency Medicine Provider Triage Evaluation Note  [name] , a 70 y.o. male  was evaluated in triage.  Pt complains of falling and hitting head  Review of Systems  Positive:  Negative: No loc  Physical Exam  BP 113/79 (BP Location: Left Arm)   Pulse 87   Temp (!) 97.4 F (36.3 C) (Oral)   Resp 16   SpO2 96%  Gen:   Awake, no distress  Resp:  Normal effort  MSK:   Moves extremities without difficulty  Other:  Laceration forehead  Medical Decision Making  Medically screening exam initiated at 1:12 PM.  Appropriate orders placed.  [name] was informed that the remainder of the evaluation will be completed by another provider, this initial triage assessment does not replace that evaluation, and the importance of remaining in the ED until their evaluation is complete.     [name], [name] 01/05/21 1313    Davonna Belling, MD 01/05/21 [phone]

## 2021-01-05 NOTE — ED Notes (Signed)
Supplies @ bedside.

## 2021-01-05 NOTE — Discharge Instructions (Signed)
Your history and exam today are consistent with a mechanical fall leading to a forehead laceration.  The CT of her head did not show any new bleeding, skull fractures, or acute abnormalities.  You were observed for over 10 hours with no significant symptoms such as headache, neck pain, or other complaints.  Your wound was cleaned, sutured, and manage.  We feel you are safe for discharge home.  Your tetanus was updated.  Please follow-up with your primary doctor and the sutures were absorbable so you will not need to have the removed.  If any symptoms change or worsen, please return to the nearest emergency department.

## 2021-01-05 NOTE — ED Triage Notes (Signed)
Pt fell out of wheel chair while transferring from chair to couch. Lac to forehead. Hx of dementia. Denies pain.

## 2021-01-05 NOTE — ED Notes (Signed)
Provider @ bedside treating Laceration.

## 2021-01-05 NOTE — ED Provider Notes (Signed)
[facility] EMERGENCY DEPARTMENT Provider Note   CSN: [id number] Arrival date & time: 01/05/21  1248     History Chief Complaint  Patient presents with  . Fall    [name] is a 70 y.o. male.  The history is provided by the patient and medical records. No language interpreter was used.  Fall This is a new problem. The current episode started 3 to 5 hours ago. The problem occurs rarely. The problem has not changed since onset.Pertinent negatives include no chest pain, no abdominal pain, no headaches and no shortness of breath. Nothing aggravates the symptoms. Nothing relieves the symptoms. He has tried nothing for the symptoms. The treatment provided no relief.       Past Medical History:  Diagnosis Date  . Alcoholism ([place])   . Blind left eye ~ 2009  . Carotid artery occlusion   . CHF (congestive heart failure) ([place])   . Complication of anesthesia   . Coronary artery disease   . CVA (cerebral infarction)   . Dementia ([place])   . DVT (deep venous thrombosis) ([place])   . High cholesterol   . Hx of cardiovascular stress test    Lexiscan Myoview 4/16: Anterolateral infarct with very mild peri-infarct ischemia, EF 42%; intermediate risk >>medical therapy recommended  . Hyperlipidemia   . Hypertension   . Myocardial infarction ([place]) ~ 2011  . PAD (peripheral artery disease) (HCC)    non-viable tissue RLE  . Paroxysmal atrial fibrillation (HCC)   . Peripheral vascular disease ([place])   . Pre-diabetes   . 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 Shortness of breath dyspnea    occ  . Stroke [facility]) ~ 2009   "left eye; totally blind there"     Patient Active Problem List   Diagnosis Date Noted  . Rectal bleeding 12/08/2019  . Heme + stool 12/08/2019  . Anemia of chronic disease 12/08/2019  . Knee contracture, left   . Malnutrition of moderate degree 07/27/2019  . Osteomyelitis ([place]) 07/25/2019  . Protein-calorie malnutrition, severe ([place])  07/25/2019  . Foot ulcer ([place]) 05/08/2019  . Acute osteomyelitis of metatarsal bone of right foot ([place]) 04/27/2019  . Critical lower limb ischemia ([place]) 04/25/2019  . Pressure injury of skin 03/20/2019  . Osteomyelitis of right foot ([place]) 03/15/2019  . Wound of right foot 03/14/2019  . Coronary artery disease   . Hypertension   . Paroxysmal atrial fibrillation (HCC)   . CKD (chronic kidney disease), stage IV ([place])   . Dementia ([place]) 02/10/2019  . Abnormal hearing screen 02/19/2018  . Presbycusis of both ears 02/19/2018  . Asymptomatic stenosis of left carotid artery 12/28/2014  . Carotid stenosis 12/10/2014  . HLD (hyperlipidemia) 12/10/2014  . Left leg swelling 10/22/2014  . PAD (peripheral artery disease) ([place]) 10/22/2014  . Pain of left leg 10/22/2014  . Sensation of cold in leg-left 10/22/2014  . Paroxysmal supraventricular tachycardia ([place]) 09/28/2014  . Lumbosacral radiculopathy at L5   . Foot drop, left 09/27/2014  . Polysubstance abuse ([place]) 09/27/2014  . Acute encephalopathy 06/30/2012  . Cocaine use 06/29/2012  . Syncope 06/29/2012  . Coronary atherosclerosis 03/29/2008  . Peripheral vascular disease ([place]) 03/29/2008  . Other and unspecified hyperlipidemia 03/29/2008  . Peripheral angiopathy in diseases classified elsewhere ([place]) 03/29/2008  . Chest pain, unspecified 03/29/2008  . Essential hypertension 03/26/2008  . Hypertrophy of prostate without urinary obstruction and other lower urinary tract symptoms (LUTS) 03/26/2008    Past Surgical  History:  Procedure Laterality Date  . ABDOMINAL AORTOGRAM W/LOWER EXTREMITY Bilateral 03/20/2019   Procedure: ABDOMINAL AORTOGRAM W/LOWER EXTREMITY;  Surgeon: Serafina Mitchell, MD;  Location: [facility] CV LAB;  Service: Cardiovascular;  Laterality: Bilateral;  . AMPUTATION Right 05/08/2019   Procedure: AMPUTATION ABOVE KNEE RIGHT;  Surgeon: Serafina Mitchell, MD;  Location: [facility];  Service: Vascular;  Laterality: Right;  .  AMPUTATION Left 07/29/2019   Procedure: LEFT ABOVE KNEE AMPUTATION;  Surgeon: Newt Minion, MD;  Location: [facility];  Service: Orthopedics;  Laterality: Left;  . APPENDECTOMY    . BACK SURGERY     1998-99 had disc removed from back  . CARDIAC CATHETERIZATION    . CAROTID ENDARTERECTOMY Left 12/28/2014  . CORONARY ARTERY BYPASS GRAFT  ?2011  . ENDARTERECTOMY Left 12/28/2014   Procedure: LEFT CAROTID ENDARTERECTOMY WITH PATCH ANGIOPLASTY;  Surgeon: Elam Dutch, MD;  Location: [facility];  Service: Vascular;  Laterality: Left;  . heart bypass    . PERIPHERAL ARTERIAL STENT GRAFT  2000's   BLE       Family History  Problem Relation Age of Onset  . Lung cancer Sister   . Heart disease Brother   . Diabetes Sister   . Clotting disorder Sister   . Heart disease Sister   . Colon cancer Neg Hx   . Esophageal cancer Neg Hx   . Diabetes Sister   . Heart disease Sister     Social History   Tobacco Use  . Smoking status: Former Smoker    Packs/day: 0.12    Years: 50.00    Pack years: 6.00    Types: Cigarettes    Start date: 10/27/2014    Quit date: 2019    Years since quitting: 3.3  . Smokeless tobacco: Never Used  Vaping Use  . Vaping Use: Never used  Substance Use Topics  . Alcohol use: Not Currently    Comment: used to drink constantly; now maybe 1/2 beer qod"  . Drug use: Never    Types: "Crack" cocaine    Comment: "stopped ~ 7-8 yrs ago yr ago"    Home Medications Prior to Admission medications   Medication Sig Start Date End Date Taking? Authorizing Provider  amiodarone (PACERONE) 200 MG tablet Take 200 mg by mouth 2 (two) times daily.    [provider]  apixaban (ELIQUIS) 5 MG TABS tablet Take 5 mg by mouth 2 (two) times daily with a meal. Patient not taking: Reported on 06/03/2020    [provider]  aspirin EC 81 MG tablet Take 1 tablet (81 mg total) by mouth daily. 07/01/12   Rai, Vernelle Emerald, MD  atorvastatin (LIPITOR) 40 MG tablet Take 40 mg by  mouth at bedtime.     [provider]  docusate sodium (COLACE) 100 MG capsule Take 1 capsule (100 mg total) by mouth 2 (two) times daily. 08/04/19   Geradine Girt, DO  folic acid (FOLVITE) 1 MG tablet Take 1 mg by mouth daily.     [provider]    Allergies    Patient has no known allergies.  Review of Systems   Review of Systems  Constitutional: Negative for chills, fatigue and fever.  HENT: Negative for congestion.   Eyes: Negative for pain.  Respiratory: Negative for chest tightness and shortness of breath.   Cardiovascular: Negative for chest pain.  Gastrointestinal: Negative for abdominal pain.  Genitourinary: Negative for flank pain.  Musculoskeletal: Negative for back pain  and neck pain.  Neurological: Negative for dizziness, weakness, light-headedness, numbness and headaches.  Psychiatric/Behavioral: Negative for agitation.  All other systems reviewed and are negative.   Physical Exam Updated Vital Signs BP 113/79 (BP Location: Left Arm)   Pulse 87   Temp (!) 97.4 F (36.3 C) (Oral)   Resp 16   SpO2 96%   Physical Exam Vitals and nursing note reviewed.  Constitutional:      Appearance: He is well-developed.  HENT:     Head: Laceration present.      Comments: Normal extraocular movements.    Nose: Nose normal.     Mouth/Throat:     Mouth: Mucous membranes are moist.  Eyes:     Extraocular Movements: Extraocular movements intact.     Conjunctiva/sclera: Conjunctivae normal.     Pupils: Pupils are equal, round, and reactive to light.  Cardiovascular:     Rate and Rhythm: Normal rate and regular rhythm.     Heart sounds: No murmur heard.   Pulmonary:     Effort: Pulmonary effort is normal. No respiratory distress.     Breath sounds: Normal breath sounds.  Abdominal:     Palpations: Abdomen is soft.     Tenderness: There is no abdominal tenderness. There is no guarding or rebound.  Musculoskeletal:        General: No tenderness.      Cervical back: Neck supple. No tenderness.     Right lower leg: No edema.     Left lower leg: No edema.  Skin:    General: Skin is warm and dry.     Capillary Refill: Capillary refill takes less than 2 seconds.  Neurological:     Mental Status: He is alert. Mental status is at baseline.  Psychiatric:        Mood and Affect: Mood normal.     ED Results / Procedures / Treatments   Labs (all labs ordered are listed, but only abnormal results are displayed) Labs Reviewed - No data to display  EKG None  Radiology CT Head Wo Contrast  Result Date: 01/05/2021 CLINICAL DATA:  Fall.  Facial trauma. EXAM: CT HEAD WITHOUT CONTRAST TECHNIQUE: Contiguous axial images were obtained from the base of the skull through the vertex without intravenous contrast. COMPARISON:  06/18/2018 FINDINGS: Brain: There is no evidence for acute hemorrhage, hydrocephalus, mass lesion, or abnormal extra-axial fluid collection. No definite CT evidence for acute infarction. Diffuse loss of parenchymal volume is consistent with atrophy. Patchy low attenuation in the deep hemispheric and periventricular white matter is nonspecific, but likely reflects chronic microvascular ischemic demyelination. Old infarcts again noted left frontal lobe and left posterior parietal lobe. Lacunar infarct in the right cerebellum is stable. Vascular: No hyperdense vessel or unexpected calcification. Skull: No evidence for fracture. No worrisome lytic or sclerotic lesion. Sinuses/Orbits: Old blowout fracture noted right medial orbital wall, stable in the interval. The visualized paranasal sinuses and mastoid air cells are clear. Visualized portions of the globes and intraorbital fat are unremarkable. Other: None. IMPRESSION: 1. No acute intracranial abnormality. 2. Atrophy with chronic small vessel white matter ischemic disease. 3. 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 4. Old right medial orbital wall blowout fracture.  Electronically Signed   By: Misty Stanley M.D.   On: 01/05/2021 14:23    Procedures Procedures   Medications Ordered in ED Medications  bacitracin ointment (has no administration in time range)  lidocaine-EPINEPHrine (XYLOCAINE W/EPI) 2 %-1:200000 (PF) injection  10 mL (10 mLs Intradermal Given by Other 01/05/21 2315)  Tdap (BOOSTRIX) injection 0.5 mL (0.5 mLs Intramuscular Given 01/05/21 2205)    ED Course  I have reviewed the triage vital signs and the nursing notes.  Pertinent labs & imaging results that were available during my care of the patient were reviewed by me and considered in my medical decision making (see chart for details).    MDM Rules/Calculators/A&P                          Butch Crader is a 70 y.o. male with a past medical history significant for dementia, hypertension, peripheral vascular disease, carotid stenosis, paroxysmal atrial fibrillation on Eliquis therapy, prior DVT, CAD, hypercholesterolemia, and CHF who presents with a forehead laceration after a fall today.  Patient reports he was trying to go from a wheelchair to his bed when he tumbled to the ground and hit his forehead.  He reported it bled a small amount but that has not bothered him at all.  He is unsure of his last tetanus shot.  He denies any headache, neck pain, face pain, or other complaints.  He just wants to have it repaired.  He denies any nausea, vomiting, vision changes from his baseline, numbness, tingling, weakness of extremities.  On exam, patient does have a 2 similar laceration to his right forehead that is hemostatic.  Lungs clear and chest nontender.  Abdomen nontender.  Patient moving arms.  Pupi normal extraocular movements.  Patient otherwise feels he is at his normal baseline and denies complaints.  Patient had CT scan in triage which showed no acute fracture or bleed.  Old infarcts and chronic microvascular changes seen.  Old orbital fracture also seen.  Given patient's well  appearance and denying of any complaints including headache, we feel he is safe for monitoring and wound repair followed by likely discharge.  Wound was repaired by Dr. Miki Kins without difficulty.  Tdap updated.  Given patient's well appearance and observation for over 11 hours in the emergency department without any complaints whatsoever, we feel he is safe for discharge home without further work-up including labs or other imaging.  Patient agrees.  He will follow-up with PCP.  As oral sutures were utilized.  He agreed with plan of care and discharge and was discharged in good condition.   Final Clinical Impression(s) / ED Diagnoses Final diagnoses:  Fall, initial encounter  Laceration of forehead, initial encounter    Rx / DC Orders ED Discharge Orders    None      Clinical Impression: 1. Fall, initial encounter   2. Laceration of forehead, initial encounter     Disposition: Discharge  Condition: Good  I have discussed the results, Dx and Tx plan with the pt(& family if present). He/she/they expressed understanding and agree(s) with the plan. Discharge instructions discussed at great length. Strict return precautions discussed and pt &/or family have verbalized understanding of the instructions. No further questions at time of discharge.    New Prescriptions   No medications on file    Follow Up: [name], MD [place] [id number] [facility] EMERGENCY DEPARTMENT [address] [id number] mc [place]       Braniya Farrugia, Gwenyth Allegra, MD 01/06/21 [phone]

## 2021-01-05 NOTE — ED Provider Notes (Signed)
  Physical Exam  BP 122/73   Pulse 85   Temp 97.7 F (36.5 C) (Oral)   Resp 14   SpO2 97%   Physical Exam  ED Course/Procedures     .Marland KitchenLaceration Repair  Date/Time: 01/05/2021 11:34 PM Performed by: Suzan Nailer, DO Authorized by: Courtney Paris, MD   Consent:    Consent obtained:  Verbal   Risks discussed:  Infection, pain, poor cosmetic result and poor wound healing   Alternatives discussed:  No treatment, delayed treatment and observation Universal protocol:    Procedure explained and questions answered to patient or proxy's satisfaction: yes     Patient identity confirmed:  Verbally with patient and arm band Anesthesia:    Anesthesia method:  None Laceration details:    Location:  Face   Face location:  Forehead   Length (cm):  2   Depth (mm):  0.5 Pre-procedure details:    Preparation:  Imaging obtained to evaluate for foreign bodies Exploration:    Wound extent: no fascia violation noted   Treatment:    Area cleansed with:  Chlorhexidine   Amount of cleaning:  Standard   Irrigation solution:  Sterile saline   Irrigation method:  Syringe   Visualized foreign bodies/material removed: no     Debridement:  Minimal   Undermining:  None Skin repair:    Repair method:  Sutures   Suture size:  5-0   Suture material:  Fast-absorbing gut   Suture technique:  Simple interrupted   Number of sutures:  6 Approximation:    Approximation:  Close Repair type:    Repair type:  Simple Post-procedure details:    Dressing:  Antibiotic ointment and non-adherent dressing   Procedure completion:  Tolerated well, no immediate complications          Suzan Nailer, DO 01/05/21 2336    Tegeler, Gwenyth Allegra, MD 01/06/21 2053

## 2021-01-05 NOTE — ED Provider Notes (Incomplete)
[facility] EMERGENCY DEPARTMENT Provider Note   CSN: [id number] Arrival date & time: 01/05/21  1248     History Chief Complaint  Patient presents with  . Fall    [name] is a 70 y.o. male.  HPI     Past Medical History:  Diagnosis Date  . Alcoholism ([place])   . Blind left eye ~ 2009  . Carotid artery occlusion   . CHF (congestive heart failure) ([place])   . Complication of anesthesia   . Coronary artery disease   . CVA (cerebral infarction)   . Dementia ([place])   . DVT (deep venous thrombosis) ([place])   . High cholesterol   . Hx of cardiovascular stress test    Lexiscan Myoview 4/16: Anterolateral infarct with very mild peri-infarct ischemia, EF 42%; intermediate risk >>medical therapy recommended  . Hyperlipidemia   . Hypertension   . Myocardial infarction ([place]) ~ 2011  . PAD (peripheral artery disease) (HCC)    non-viable tissue RLE  . Paroxysmal atrial fibrillation (HCC)   . Peripheral vascular disease ([place])   . Pre-diabetes   . 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 Shortness of breath dyspnea    occ  . Stroke [facility]) ~ 2009   "left eye; totally blind there"     Patient Active Problem List   Diagnosis Date Noted  . Rectal bleeding 12/08/2019  . Heme + stool 12/08/2019  . Anemia of chronic disease 12/08/2019  . Knee contracture, left   . Malnutrition of moderate degree 07/27/2019  . Osteomyelitis ([place]) 07/25/2019  . Protein-calorie malnutrition, severe ([place]) 07/25/2019  . Foot ulcer ([place]) 05/08/2019  . Acute osteomyelitis of metatarsal bone of right foot ([place]) 04/27/2019  . Critical lower limb ischemia ([place]) 04/25/2019  . Pressure injury of skin 03/20/2019  . Osteomyelitis of right foot ([place]) 03/15/2019  . Wound of right foot 03/14/2019  . Coronary artery disease   . Hypertension   . Paroxysmal atrial fibrillation (HCC)   . CKD (chronic kidney disease), stage IV ([place])   . Dementia ([place]) 02/10/2019  . Abnormal hearing  screen 02/19/2018  . Presbycusis of both ears 02/19/2018  . Asymptomatic stenosis of left carotid artery 12/28/2014  . Carotid stenosis 12/10/2014  . HLD (hyperlipidemia) 12/10/2014  . Left leg swelling 10/22/2014  . PAD (peripheral artery disease) ([place]) 10/22/2014  . Pain of left leg 10/22/2014  . Sensation of cold in leg-left 10/22/2014  . Paroxysmal supraventricular tachycardia ([place]) 09/28/2014  . Lumbosacral radiculopathy at L5   . Foot drop, left 09/27/2014  . Polysubstance abuse ([place]) 09/27/2014  . Acute encephalopathy 06/30/2012  . Cocaine use 06/29/2012  . Syncope 06/29/2012  . Coronary atherosclerosis 03/29/2008  . Peripheral vascular disease ([place]) 03/29/2008  . Other and unspecified hyperlipidemia 03/29/2008  . Peripheral angiopathy in diseases classified elsewhere ([place]) 03/29/2008  . Chest pain, unspecified 03/29/2008  . Essential hypertension 03/26/2008  . Hypertrophy of prostate without urinary obstruction and other lower urinary tract symptoms (LUTS) 03/26/2008    Past Surgical History:  Procedure Laterality Date  . ABDOMINAL AORTOGRAM W/LOWER EXTREMITY Bilateral 03/20/2019   Procedure: ABDOMINAL AORTOGRAM W/LOWER EXTREMITY;  Surgeon: Serafina Mitchell, MD;  Location: [facility];  Service: Cardiovascular;  Laterality: Bilateral;  . AMPUTATION Right 05/08/2019   Procedure: AMPUTATION ABOVE KNEE RIGHT;  Surgeon: Serafina Mitchell, MD;  Location: [facility];  Service: Vascular;  Laterality: Right;  . AMPUTATION Left 07/29/2019   Procedure: LEFT ABOVE KNEE AMPUTATION;  Surgeon: Newt Minion, MD;  Location: [facility];  Service: Orthopedics;  Laterality: Left;  . APPENDECTOMY    . BACK SURGERY     1998-99 had disc removed from back  . CARDIAC CATHETERIZATION    . CAROTID ENDARTERECTOMY Left 12/28/2014  . CORONARY ARTERY BYPASS GRAFT  ?2011  . ENDARTERECTOMY Left 12/28/2014   Procedure: LEFT CAROTID ENDARTERECTOMY WITH PATCH ANGIOPLASTY;  Surgeon: Elam Dutch, MD;   Location: [facility];  Service: Vascular;  Laterality: Left;  . heart bypass    . PERIPHERAL ARTERIAL STENT GRAFT  2000's   BLE       Family History  Problem Relation Age of Onset  . Lung cancer Sister   . Heart disease Brother   . Diabetes Sister   . Clotting disorder Sister   . Heart disease Sister   . Colon cancer Neg Hx   . Esophageal cancer Neg Hx   . Diabetes Sister   . Heart disease Sister     Social History   Tobacco Use  . Smoking status: Former Smoker    Packs/day: 0.12    Years: 50.00    Pack years: 6.00    Types: Cigarettes    Start date: 10/27/2014    Quit date: 2019    Years since quitting: 3.3  . Smokeless tobacco: Never Used  Vaping Use  . Vaping Use: Never used  Substance Use Topics  . Alcohol use: Not Currently    Comment: used to drink constantly; now maybe 1/2 beer qod"  . Drug use: Never    Types: "Crack" cocaine    Comment: "stopped ~ 7-8 yrs ago yr ago"    Home Medications Prior to Admission medications   Medication Sig Start Date End Date Taking? Authorizing Provider  amiodarone (PACERONE) 200 MG tablet Take 200 mg by mouth 2 (two) times daily.    [provider]  apixaban (ELIQUIS) 5 MG TABS tablet Take 5 mg by mouth 2 (two) times daily with a meal. Patient not taking: Reported on 06/03/2020    [provider]  aspirin EC 81 MG tablet Take 1 tablet (81 mg total) by mouth daily. 07/01/12   Rai, Vernelle Emerald, MD  atorvastatin (LIPITOR) 40 MG tablet Take 40 mg by mouth at bedtime.     [provider]  docusate sodium (COLACE) 100 MG capsule Take 1 capsule (100 mg total) by mouth 2 (two) times daily. 08/04/19   Geradine Girt, DO  folic acid (FOLVITE) 1 MG tablet Take 1 mg by mouth daily.     [provider]    Allergies    Patient has no known allergies.  Review of Systems   Review of Systems  Physical Exam Updated Vital Signs BP 113/79 (BP Location: Left Arm)   Pulse 87   Temp (!) 97.4 F (36.3 C)  (Oral)   Resp 16   SpO2 96%   Physical Exam  ED Results / Procedures / Treatments   Labs (all labs ordered are listed, but only abnormal results are displayed) Labs Reviewed - No data to display  EKG None  Radiology CT Head Wo Contrast  Result Date: 01/05/2021 CLINICAL DATA:  Fall.  Facial trauma. EXAM: CT HEAD WITHOUT CONTRAST TECHNIQUE: Contiguous axial images were obtained from the base of the skull through the vertex without intravenous contrast. COMPARISON:  06/18/2018 FINDINGS: Brain: There is no evidence for acute hemorrhage, hydrocephalus, mass lesion, or abnormal extra-axial fluid collection. No definite CT evidence for  acute infarction. Diffuse loss of parenchymal volume is consistent with atrophy. Patchy low attenuation in the deep hemispheric and periventricular white matter is nonspecific, but likely reflects chronic microvascular ischemic demyelination. Old infarcts again noted left frontal lobe and left posterior parietal lobe. Lacunar infarct in the right cerebellum is stable. Vascular: No hyperdense vessel or unexpected calcification. Skull: No evidence for fracture. No worrisome lytic or sclerotic lesion. Sinuses/Orbits: Old blowout fracture noted right medial orbital wall, stable in the interval. The visualized paranasal sinuses and mastoid air cells are clear. Visualized portions of the globes and intraorbital fat are unremarkable. Other: None. IMPRESSION: 1. No acute intracranial abnormality. 2. Atrophy with chronic small vessel white matter ischemic disease. 3. 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 4. Old right medial orbital wall blowout fracture. Electronically Signed   By: Misty Stanley M.D.   On: 01/05/2021 14:23    Procedures Procedures {Remember to document critical care time when appropriate:1}  Medications Ordered in ED Medications - No data to display  ED Course  I have reviewed the triage vital signs and the nursing  notes.  Pertinent labs & imaging results that were available during my care of the patient were reviewed by me and considered in my medical decision making (see chart for details).    MDM Rules/Calculators/A&P                          [name] is a 70 y.o. male with a past medical history significant for dementia, hypertension, peripheral vascular disease, carotid stenosis, paroxysmal atrial fibrillation on Eliquis therapy, prior DVT, CAD, hypercholesterolemia, and CHF who presents with a forehead laceration after a fall today.  Patient reports he was trying to go from a wheelchair to his bed when he tumbled to the ground and hit his forehead.  He reported it bled a small amount but that has not bothered him at all.  He is unsure of his last tetanus shot.  He denies any headache, neck pain, face pain, or other complaints.  He just wants to have it repaired.  He denies any nausea, vomiting, vision changes from his baseline, numbness, tingling, weakness of extremities.  On exam, patient does have a 2 similar laceration to his right forehead that is hemostatic.  Lungs clear and chest nontender.  Abdomen nontender.  Patient moving arms.  Pupi normal extraocular movements.  Patient otherwise feels he is at his normal baseline and denies complaints.  Patient had CT scan in triage which showed no acute fracture or bleed.      Final Clinical Impression(s) / ED Diagnoses Final diagnoses:  None    Rx / DC Orders ED Discharge Orders    None

## 2021-01-06 DIAGNOSIS — S0181XA Laceration without foreign body of other part of head, initial encounter: Secondary | ICD-10-CM | POA: Diagnosis not present

## 2021-01-06 DIAGNOSIS — W19XXXA Unspecified fall, initial encounter: Secondary | ICD-10-CM | POA: Diagnosis not present

## 2021-01-06 DIAGNOSIS — R279 Unspecified lack of coordination: Secondary | ICD-10-CM | POA: Diagnosis not present

## 2021-01-06 DIAGNOSIS — R5381 Other malaise: Secondary | ICD-10-CM | POA: Diagnosis not present

## 2021-01-06 DIAGNOSIS — Z743 Need for continuous supervision: Secondary | ICD-10-CM | POA: Diagnosis not present

## 2021-01-06 NOTE — ED Notes (Addendum)
Niece called and made aware pt is going home and [facility] here to pick up pt, stated wold be home to receive pt.Fo Follow up appts reviewed w/ niece. Denies questions or concerns @ this time. Education on s/s of worsening and when to return. Tx for fall lac sutured to forehead. Dressing applied, clean, dry, and intact. Pt cleaned p[rior to transport. Left in stretcher w/ [facility]. NAD. VSS.

## 2021-01-06 NOTE — ED Notes (Signed)
[facility] called  

## 2021-01-10 ENCOUNTER — Telehealth: Payer: Medicare Other | Admitting: Neurology

## 2021-01-12 ENCOUNTER — Emergency Department (HOSPITAL_COMMUNITY): Payer: Medicare Other

## 2021-01-12 ENCOUNTER — Encounter (HOSPITAL_COMMUNITY): Payer: Self-pay

## 2021-01-12 ENCOUNTER — Inpatient Hospital Stay (HOSPITAL_COMMUNITY)
Admission: EM | Admit: 2021-01-12 | Discharge: 2021-02-01 | DRG: 871 | Disposition: E | Payer: Medicare Other | Attending: Internal Medicine | Admitting: Internal Medicine

## 2021-01-12 DIAGNOSIS — E1122 Type 2 diabetes mellitus with diabetic chronic kidney disease: Secondary | ICD-10-CM | POA: Diagnosis not present

## 2021-01-12 DIAGNOSIS — I2510 Atherosclerotic heart disease of native coronary artery without angina pectoris: Secondary | ICD-10-CM | POA: Diagnosis not present

## 2021-01-12 DIAGNOSIS — N184 Chronic kidney disease, stage 4 (severe): Secondary | ICD-10-CM | POA: Diagnosis present

## 2021-01-12 DIAGNOSIS — M4628 Osteomyelitis of vertebra, sacral and sacrococcygeal region: Secondary | ICD-10-CM | POA: Diagnosis present

## 2021-01-12 DIAGNOSIS — K8689 Other specified diseases of pancreas: Secondary | ICD-10-CM | POA: Diagnosis not present

## 2021-01-12 DIAGNOSIS — I130 Hypertensive heart and chronic kidney disease with heart failure and stage 1 through stage 4 chronic kidney disease, or unspecified chronic kidney disease: Secondary | ICD-10-CM | POA: Diagnosis not present

## 2021-01-12 DIAGNOSIS — E1151 Type 2 diabetes mellitus with diabetic peripheral angiopathy without gangrene: Secondary | ICD-10-CM | POA: Diagnosis present

## 2021-01-12 DIAGNOSIS — Z89611 Acquired absence of right leg above knee: Secondary | ICD-10-CM

## 2021-01-12 DIAGNOSIS — Z87891 Personal history of nicotine dependence: Secondary | ICD-10-CM

## 2021-01-12 DIAGNOSIS — J90 Pleural effusion, not elsewhere classified: Secondary | ICD-10-CM | POA: Diagnosis not present

## 2021-01-12 DIAGNOSIS — R404 Transient alteration of awareness: Secondary | ICD-10-CM | POA: Diagnosis not present

## 2021-01-12 DIAGNOSIS — Z20822 Contact with and (suspected) exposure to covid-19: Secondary | ICD-10-CM | POA: Diagnosis present

## 2021-01-12 DIAGNOSIS — E11649 Type 2 diabetes mellitus with hypoglycemia without coma: Secondary | ICD-10-CM | POA: Diagnosis present

## 2021-01-12 DIAGNOSIS — B964 Proteus (mirabilis) (morganii) as the cause of diseases classified elsewhere: Secondary | ICD-10-CM | POA: Diagnosis not present

## 2021-01-12 DIAGNOSIS — I509 Heart failure, unspecified: Secondary | ICD-10-CM | POA: Diagnosis not present

## 2021-01-12 DIAGNOSIS — K8020 Calculus of gallbladder without cholecystitis without obstruction: Secondary | ICD-10-CM | POA: Diagnosis not present

## 2021-01-12 DIAGNOSIS — A419 Sepsis, unspecified organism: Secondary | ICD-10-CM | POA: Diagnosis not present

## 2021-01-12 DIAGNOSIS — Z66 Do not resuscitate: Secondary | ICD-10-CM | POA: Diagnosis present

## 2021-01-12 DIAGNOSIS — N179 Acute kidney failure, unspecified: Secondary | ICD-10-CM | POA: Diagnosis not present

## 2021-01-12 DIAGNOSIS — R6521 Severe sepsis with septic shock: Secondary | ICD-10-CM | POA: Diagnosis present

## 2021-01-12 DIAGNOSIS — J9601 Acute respiratory failure with hypoxia: Secondary | ICD-10-CM | POA: Diagnosis not present

## 2021-01-12 DIAGNOSIS — Z79899 Other long term (current) drug therapy: Secondary | ICD-10-CM

## 2021-01-12 DIAGNOSIS — E785 Hyperlipidemia, unspecified: Secondary | ICD-10-CM | POA: Diagnosis not present

## 2021-01-12 DIAGNOSIS — L89154 Pressure ulcer of sacral region, stage 4: Secondary | ICD-10-CM | POA: Diagnosis present

## 2021-01-12 DIAGNOSIS — I252 Old myocardial infarction: Secondary | ICD-10-CM

## 2021-01-12 DIAGNOSIS — I499 Cardiac arrhythmia, unspecified: Secondary | ICD-10-CM | POA: Diagnosis not present

## 2021-01-12 DIAGNOSIS — G9341 Metabolic encephalopathy: Secondary | ICD-10-CM | POA: Diagnosis not present

## 2021-01-12 DIAGNOSIS — F0150 Vascular dementia without behavioral disturbance: Secondary | ICD-10-CM | POA: Diagnosis present

## 2021-01-12 DIAGNOSIS — R6520 Severe sepsis without septic shock: Secondary | ICD-10-CM

## 2021-01-12 DIAGNOSIS — Z743 Need for continuous supervision: Secondary | ICD-10-CM | POA: Diagnosis not present

## 2021-01-12 DIAGNOSIS — D509 Iron deficiency anemia, unspecified: Secondary | ICD-10-CM | POA: Diagnosis not present

## 2021-01-12 DIAGNOSIS — L89159 Pressure ulcer of sacral region, unspecified stage: Secondary | ICD-10-CM | POA: Diagnosis not present

## 2021-01-12 DIAGNOSIS — E7800 Pure hypercholesterolemia, unspecified: Secondary | ICD-10-CM | POA: Diagnosis present

## 2021-01-12 DIAGNOSIS — R6889 Other general symptoms and signs: Secondary | ICD-10-CM | POA: Diagnosis not present

## 2021-01-12 DIAGNOSIS — Z951 Presence of aortocoronary bypass graft: Secondary | ICD-10-CM

## 2021-01-12 DIAGNOSIS — H5462 Unqualified visual loss, left eye, normal vision right eye: Secondary | ICD-10-CM | POA: Diagnosis present

## 2021-01-12 DIAGNOSIS — Z8673 Personal history of transient ischemic attack (TIA), and cerebral infarction without residual deficits: Secondary | ICD-10-CM

## 2021-01-12 DIAGNOSIS — Z515 Encounter for palliative care: Secondary | ICD-10-CM | POA: Diagnosis not present

## 2021-01-12 DIAGNOSIS — E1169 Type 2 diabetes mellitus with other specified complication: Secondary | ICD-10-CM | POA: Diagnosis not present

## 2021-01-12 DIAGNOSIS — B966 Bacteroides fragilis [B. fragilis] as the cause of diseases classified elsewhere: Secondary | ICD-10-CM | POA: Diagnosis not present

## 2021-01-12 DIAGNOSIS — E872 Acidosis: Secondary | ICD-10-CM | POA: Diagnosis not present

## 2021-01-12 DIAGNOSIS — I480 Paroxysmal atrial fibrillation: Secondary | ICD-10-CM | POA: Diagnosis present

## 2021-01-12 DIAGNOSIS — F1020 Alcohol dependence, uncomplicated: Secondary | ICD-10-CM | POA: Diagnosis present

## 2021-01-12 DIAGNOSIS — R4182 Altered mental status, unspecified: Secondary | ICD-10-CM | POA: Diagnosis not present

## 2021-01-12 DIAGNOSIS — R188 Other ascites: Secondary | ICD-10-CM | POA: Diagnosis not present

## 2021-01-12 DIAGNOSIS — Z8249 Family history of ischemic heart disease and other diseases of the circulatory system: Secondary | ICD-10-CM

## 2021-01-12 DIAGNOSIS — Z7982 Long term (current) use of aspirin: Secondary | ICD-10-CM

## 2021-01-12 DIAGNOSIS — E161 Other hypoglycemia: Secondary | ICD-10-CM | POA: Diagnosis not present

## 2021-01-12 DIAGNOSIS — Z833 Family history of diabetes mellitus: Secondary | ICD-10-CM

## 2021-01-12 DIAGNOSIS — Z89612 Acquired absence of left leg above knee: Secondary | ICD-10-CM

## 2021-01-12 DIAGNOSIS — I10 Essential (primary) hypertension: Secondary | ICD-10-CM | POA: Diagnosis not present

## 2021-01-12 HISTORY — DX: Type 2 diabetes mellitus without complications: E11.9

## 2021-01-12 LAB — CBC WITH DIFFERENTIAL/PLATELET
Abs Immature Granulocytes: 0.16 10*3/uL — ABNORMAL HIGH (ref 0.00–0.07)
Basophils Absolute: 0 10*3/uL (ref 0.0–0.1)
Basophils Relative: 0 %
Eosinophils Absolute: 0 10*3/uL (ref 0.0–0.5)
Eosinophils Relative: 0 %
HCT: 25.6 % — ABNORMAL LOW (ref 39.0–52.0)
Hemoglobin: 9 g/dL — ABNORMAL LOW (ref 13.0–17.0)
Immature Granulocytes: 1 %
Lymphocytes Relative: 5 %
Lymphs Abs: 0.7 10*3/uL (ref 0.7–4.0)
MCH: 24.6 pg — ABNORMAL LOW (ref 26.0–34.0)
MCHC: 35.2 g/dL (ref 30.0–36.0)
MCV: 69.9 fL — ABNORMAL LOW (ref 80.0–100.0)
Monocytes Absolute: 0.4 10*3/uL (ref 0.1–1.0)
Monocytes Relative: 3 %
Neutro Abs: 13.1 10*3/uL — ABNORMAL HIGH (ref 1.7–7.7)
Neutrophils Relative %: 91 %
Platelets: 150 10*3/uL (ref 150–400)
RBC: 3.66 MIL/uL — ABNORMAL LOW (ref 4.22–5.81)
RDW: 15.5 % (ref 11.5–15.5)
WBC: 14.4 10*3/uL — ABNORMAL HIGH (ref 4.0–10.5)
nRBC: 0.2 % (ref 0.0–0.2)

## 2021-01-12 LAB — COMPREHENSIVE METABOLIC PANEL
ALT: 71 U/L — ABNORMAL HIGH (ref 0–44)
AST: 352 U/L — ABNORMAL HIGH (ref 15–41)
Albumin: 1.4 g/dL — ABNORMAL LOW (ref 3.5–5.0)
Alkaline Phosphatase: 86 U/L (ref 38–126)
Anion gap: 15 (ref 5–15)
BUN: 50 mg/dL — ABNORMAL HIGH (ref 8–23)
CO2: 13 mmol/L — ABNORMAL LOW (ref 22–32)
Calcium: 7.2 mg/dL — ABNORMAL LOW (ref 8.9–10.3)
Chloride: 113 mmol/L — ABNORMAL HIGH (ref 98–111)
Creatinine, Ser: 2.8 mg/dL — ABNORMAL HIGH (ref 0.61–1.24)
GFR, Estimated: 24 mL/min — ABNORMAL LOW (ref >60)
Glucose, Bld: 143 mg/dL — ABNORMAL HIGH (ref 70–99)
Potassium: 5 mmol/L (ref 3.5–5.1)
Sodium: 141 mmol/L (ref 135–145)
Total Bilirubin: 2.2 mg/dL — ABNORMAL HIGH (ref 0.3–1.2)
Total Protein: 5.7 g/dL — ABNORMAL LOW (ref 6.5–8.1)

## 2021-01-12 LAB — APTT: aPTT: 34 seconds (ref 24–36)

## 2021-01-12 LAB — PROTIME-INR
INR: 1.9 — ABNORMAL HIGH (ref 0.8–1.2)
Prothrombin Time: 22.1 seconds — ABNORMAL HIGH (ref 11.4–15.2)

## 2021-01-12 LAB — CBG MONITORING, ED
Glucose-Capillary: 105 mg/dL — ABNORMAL HIGH (ref 70–99)
Glucose-Capillary: 95 mg/dL (ref 70–99)
Glucose-Capillary: 75 mg/dL (ref 70–99)
Glucose-Capillary: 149 mg/dL — ABNORMAL HIGH (ref 70–99)

## 2021-01-12 LAB — RESP PANEL BY RT-PCR (FLU A&B, COVID) ARPGX2
Influenza A by PCR: NEGATIVE
Influenza B by PCR: NEGATIVE
SARS Coronavirus 2 by RT PCR: NEGATIVE

## 2021-01-12 LAB — LACTIC ACID, PLASMA
Lactic Acid, Venous: 8.9 mmol/L (ref 0.5–1.9)
Lactic Acid, Venous: >11 mmol/L (ref 0.5–1.9)

## 2021-01-12 MED ORDER — LACTATED RINGERS IV BOLUS (SEPSIS)
250.0000 mL | Freq: Once | INTRAVENOUS | Status: AC
  Administered 2021-01-12: 250 mL via INTRAVENOUS

## 2021-01-12 MED ORDER — HYDROCORTISONE NA SUCCINATE PF 100 MG IJ SOLR
50.0000 mg | Freq: Four times a day (QID) | INTRAMUSCULAR | Status: DC
  Administered 2021-01-12: 50 mg via INTRAVENOUS
  Filled 2021-01-12: qty 2

## 2021-01-12 MED ORDER — LACTATED RINGERS IV BOLUS
1000.0000 mL | Freq: Once | INTRAVENOUS | Status: AC
  Administered 2021-01-12: 1000 mL via INTRAVENOUS

## 2021-01-12 MED ORDER — SODIUM CHLORIDE 0.9 % IV SOLN
2.0000 g | Freq: Once | INTRAVENOUS | Status: AC
  Administered 2021-01-12: 2 g via INTRAVENOUS
  Filled 2021-01-12: qty 2

## 2021-01-12 MED ORDER — POLYVINYL ALCOHOL 1.4 % OP SOLN: 1.0000 [drp] | Freq: Four times a day (QID) | OPHTHALMIC | Status: DC | PRN

## 2021-01-12 MED ORDER — LORAZEPAM 2 MG/ML PO CONC: 1.0000 mg | ORAL | Status: DC | PRN

## 2021-01-12 MED ORDER — ACETAMINOPHEN 650 MG RE SUPP: 650.0000 mg | Freq: Four times a day (QID) | RECTAL | Status: DC | PRN

## 2021-01-12 MED ORDER — METRONIDAZOLE 500 MG/100ML IV SOLN
500.0000 mg | Freq: Once | INTRAVENOUS | Status: AC
  Administered 2021-01-12: 500 mg via INTRAVENOUS
  Filled 2021-01-12: qty 100

## 2021-01-12 MED ORDER — CHLORHEXIDINE GLUCONATE CLOTH 2 % EX PADS: 6.0000 | MEDICATED_PAD | Freq: Every day | CUTANEOUS | Status: DC

## 2021-01-12 MED ORDER — VANCOMYCIN HCL IN DEXTROSE 1-5 GM/200ML-% IV SOLN
1000.0000 mg | Freq: Once | INTRAVENOUS | Status: AC
  Administered 2021-01-12: 1000 mg via INTRAVENOUS
  Filled 2021-01-12: qty 200

## 2021-01-12 MED ORDER — SODIUM CHLORIDE 0.9 % IV BOLUS
1000.0000 mL | Freq: Once | INTRAVENOUS | Status: AC
  Administered 2021-01-12: 1000 mL via INTRAVENOUS

## 2021-01-12 MED ORDER — LORAZEPAM 2 MG/ML IJ SOLN: 1.0000 mg | INTRAMUSCULAR | Status: DC | PRN

## 2021-01-12 MED ORDER — MORPHINE SULFATE (PF) 2 MG/ML IV SOLN: 1.0000 mg | INTRAVENOUS | Status: DC | PRN

## 2021-01-12 MED ORDER — LACTATED RINGERS IV SOLN: INTRAVENOUS | Status: DC

## 2021-01-12 MED ORDER — LORAZEPAM 1 MG PO TABS: 1.0000 mg | ORAL_TABLET | ORAL | Status: DC | PRN

## 2021-01-12 MED ORDER — LACTATED RINGERS IV BOLUS (SEPSIS)
1000.0000 mL | Freq: Once | INTRAVENOUS | Status: AC
  Administered 2021-01-12: 1000 mL via INTRAVENOUS

## 2021-01-12 MED ORDER — DEXTROSE 50 % IV SOLN
1.0000 | Freq: Once | INTRAVENOUS | Status: AC
  Administered 2021-01-12: 50 mL via INTRAVENOUS
  Filled 2021-01-12: qty 50

## 2021-01-12 MED ORDER — METRONIDAZOLE 500 MG/100ML IV SOLN: 500.0000 mg | Freq: Three times a day (TID) | INTRAVENOUS | Status: DC

## 2021-01-12 MED ORDER — ACETAMINOPHEN 325 MG PO TABS: 650.0000 mg | ORAL_TABLET | Freq: Four times a day (QID) | ORAL | Status: DC | PRN

## 2021-01-13 LAB — BLOOD CULTURE ID PANEL (REFLEXED) - BCID2
A.calcoaceticus-baumannii: NOT DETECTED
Bacteroides fragilis: DETECTED — AB
CTX-M ESBL: NOT DETECTED
Candida albicans: NOT DETECTED
Candida auris: NOT DETECTED
Candida glabrata: NOT DETECTED
Candida krusei: NOT DETECTED
Candida parapsilosis: NOT DETECTED
Candida tropicalis: NOT DETECTED
Carbapenem resist OXA 48 LIKE: NOT DETECTED
Carbapenem resistance IMP: NOT DETECTED
Carbapenem resistance KPC: NOT DETECTED
Carbapenem resistance NDM: NOT DETECTED
Carbapenem resistance VIM: NOT DETECTED
Cryptococcus neoformans/gattii: NOT DETECTED
Enterobacter cloacae complex: NOT DETECTED
Enterobacterales: DETECTED — AB
Enterococcus Faecium: NOT DETECTED
Enterococcus faecalis: NOT DETECTED
Escherichia coli: NOT DETECTED
Haemophilus influenzae: NOT DETECTED
Klebsiella aerogenes: NOT DETECTED
Klebsiella oxytoca: NOT DETECTED
Klebsiella pneumoniae: NOT DETECTED
Listeria monocytogenes: NOT DETECTED
Neisseria meningitidis: NOT DETECTED
Proteus species: DETECTED — AB
Pseudomonas aeruginosa: NOT DETECTED
Salmonella species: NOT DETECTED
Serratia marcescens: NOT DETECTED
Staphylococcus aureus (BCID): NOT DETECTED
Staphylococcus epidermidis: NOT DETECTED
Staphylococcus lugdunensis: NOT DETECTED
Staphylococcus species: NOT DETECTED
Stenotrophomonas maltophilia: NOT DETECTED
Streptococcus agalactiae: NOT DETECTED
Streptococcus pneumoniae: NOT DETECTED
Streptococcus pyogenes: NOT DETECTED
Streptococcus species: NOT DETECTED

## 2021-01-15 LAB — CULTURE, BLOOD (ROUTINE X 2)

## 2021-01-16 LAB — CULTURE, BLOOD (ROUTINE X 2): Special Requests: ADEQUATE

## 2021-02-01 NOTE — Sepsis Progress Note (Signed)
elink is monitoring this code sepsis 

## 2021-02-01 NOTE — Progress Notes (Signed)
Pt HR down into the 40s with a BP of 67/39. Marylyn Ishihara, MD made aware. 1LR NS bolus ordered. Understanding of poor prognosis verbalized by family. Family brought to patient's bedside.

## 2021-02-01 NOTE — ED Triage Notes (Addendum)
Pt arrived via EMS, from home, found unresponsive in bed at home by family, estimated down time aprrox 30 mins. Hypoxic ,but breathing on own, RR 8-10 spo2 60's. BVM x20 mins. CBG 30s. Given bag D10, 25 G total.   DNR, CMO  CBG 95 in triage

## 2021-02-01 NOTE — ED Notes (Addendum)
Pt to CT. VS to be obtained once pt returns to room.

## 2021-02-01 NOTE — ED Notes (Signed)
Pt's oxygen level dropped to 88 on 6L by nasal cannula, I placed him on a non-rebreather at 10L

## 2021-02-01 NOTE — Progress Notes (Signed)
Received a call from bedside RN Ambrose Mantle regarding the patient passing away.  Per bedside RN patient was made comfort care.  Time of death 10.  Family was present at bedside.

## 2021-02-01 NOTE — ED Notes (Signed)
Lactic 8.9, PA Rebekah notified.

## 2021-02-01 NOTE — ED Notes (Signed)
105 CBG

## 2021-02-01 NOTE — Progress Notes (Signed)
A consult was received from an ED physician for vancomycin and cefepime per pharmacy dosing (for an indication other than meningitis). The patient's profile has been reviewed for ht/wt/allergies/indication/available labs. A one time order has been placed for the above antibiotics.  Further antibiotics/pharmacy consults should be ordered by admitting physician if indicated.                       Reuel Boom, PharmD, BCPS [phone] 01/29/2021, 2:53 PM

## 2021-02-01 NOTE — ED Provider Notes (Signed)
[facility] DEPT Provider Note   CSN: [id number] Arrival date & time: 2021-01-16  1224     History Chief Complaint  Patient presents with  . Hypoglycemia  . Altered Mental Status    [name] is a 70 y.o. male who presents via EMS after being found unresponsive at home.  Level 5 Caveat due to patient's acuity on presentation.  Patient's power of attorney and niece, [name], is not the patient's bedside and able to provide collateral information.  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  She states that he typically assists with his own transfers to and from his wheelchair, and feeds himself and dresses himself.  Unfortunately over the last week he has become progressively more lethargic, no longer feeding himself or assisting with normal activities.  She states that she arrived home from work this morning, approximately 30 minutes after he had last been evaluated home health aide, and she found him to be unresponsive.  She states she cannot feel a pulse, but he was breathing very weakly on his own.  She called EMS, who informed this provider that his CBG was 30 upon arrival and his oxygen saturation was 60% on room air.  He was only presented less than 10 times per minute so they assisted his ventilations with a nasal trumpet and bag-valve-mask with improvement in his oxygen saturation.  He is on 5 L of supplemental oxygen by nasal cannula at this time.  Patient remains unresponsive.  [name] did confirm that patient is to be DNR/DNI.  In discussion she states that she does want [place] to proceed with CT imaging of his head and of his belly, and that she wants [place] to proceed with antibiotic therapy for any infections that we find.   HPI     Past Medical History:  Diagnosis Date  . Alcoholism ([place])   . Blind left eye ~ 2009  . Carotid artery occlusion   .  CHF (congestive heart failure) ([place])   . Complication of anesthesia   . Coronary artery disease   . CVA (cerebral infarction)   . Dementia ([place])   . Diabetes mellitus without complication ([place])   . DVT (deep venous thrombosis) ([place])   . High cholesterol   . Hx of cardiovascular stress test    Lexiscan Myoview 4/16: Anterolateral infarct with very mild peri-infarct ischemia, EF 42%; intermediate risk >>medical therapy recommended  . Hyperlipidemia   . Hypertension   . Myocardial infarction ([place]) ~ 2011  . PAD (peripheral artery disease) (HCC)    non-viable tissue RLE  . Paroxysmal atrial fibrillation (HCC)   . Peripheral vascular disease ([place])   . Pre-diabetes   . 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 Shortness of breath dyspnea    occ  . Stroke [facility]) ~ 2009   "left eye; totally blind there"     Patient Active Problem List   Diagnosis Date Noted  . Sepsis ([place]) 16-Jan-2021  . Rectal bleeding 12/08/2019  . Heme + stool 12/08/2019  . Anemia of chronic disease 12/08/2019  . Knee contracture, left   . Malnutrition of moderate degree 07/27/2019  . Osteomyelitis ([place]) 07/25/2019  . Protein-calorie malnutrition, severe ([place]) 07/25/2019  . Foot ulcer ([place]) 05/08/2019  . Acute osteomyelitis of metatarsal bone of right foot ([place]) 04/27/2019  . Critical lower limb ischemia ([place]) 04/25/2019  . Pressure injury of skin  03/20/2019  . Osteomyelitis of right foot ([place]) 03/15/2019  . Wound of right foot 03/14/2019  . Coronary artery disease   . Hypertension   . Paroxysmal atrial fibrillation (HCC)   . CKD (chronic kidney disease), stage IV ([place])   . Dementia ([place]) 02/10/2019  . Abnormal hearing screen 02/19/2018  . Presbycusis of both ears 02/19/2018  . Asymptomatic stenosis of left carotid artery 12/28/2014  . Carotid stenosis 12/10/2014  . HLD (hyperlipidemia) 12/10/2014  . Left leg swelling 10/22/2014  . PAD (peripheral artery disease) ([place]) 10/22/2014  . Pain of  left leg 10/22/2014  . Sensation of cold in leg-left 10/22/2014  . Paroxysmal supraventricular tachycardia ([place]) 09/28/2014  . Lumbosacral radiculopathy at L5   . Foot drop, left 09/27/2014  . Polysubstance abuse ([place]) 09/27/2014  . Acute encephalopathy 06/30/2012  . Cocaine use 06/29/2012  . Syncope 06/29/2012  . Coronary atherosclerosis 03/29/2008  . Peripheral vascular disease ([place]) 03/29/2008  . Other and unspecified hyperlipidemia 03/29/2008  . Peripheral angiopathy in diseases classified elsewhere ([place]) 03/29/2008  . Chest pain, unspecified 03/29/2008  . Essential hypertension 03/26/2008  . Hypertrophy of prostate without urinary obstruction and other lower urinary tract symptoms (LUTS) 03/26/2008    Past Surgical History:  Procedure Laterality Date  . ABDOMINAL AORTOGRAM W/LOWER EXTREMITY Bilateral 03/20/2019   Procedure: ABDOMINAL AORTOGRAM W/LOWER EXTREMITY;  Surgeon: Serafina Mitchell, MD;  Location: [facility];  Service: Cardiovascular;  Laterality: Bilateral;  . AMPUTATION Right 05/08/2019   Procedure: AMPUTATION ABOVE KNEE RIGHT;  Surgeon: Serafina Mitchell, MD;  Location: [facility];  Service: Vascular;  Laterality: Right;  . AMPUTATION Left 07/29/2019   Procedure: LEFT ABOVE KNEE AMPUTATION;  Surgeon: Newt Minion, MD;  Location: [facility];  Service: Orthopedics;  Laterality: Left;  . APPENDECTOMY    . BACK SURGERY     1998-99 had disc removed from back  . CARDIAC CATHETERIZATION    . CAROTID ENDARTERECTOMY Left 12/28/2014  . CORONARY ARTERY BYPASS GRAFT  ?2011  . ENDARTERECTOMY Left 12/28/2014   Procedure: LEFT CAROTID ENDARTERECTOMY WITH PATCH ANGIOPLASTY;  Surgeon: Elam Dutch, MD;  Location: [facility];  Service: Vascular;  Laterality: Left;  . heart bypass    . PERIPHERAL ARTERIAL STENT GRAFT  2000's   BLE       Family History  Problem Relation Age of Onset  . Lung cancer Sister   . Heart disease Brother   . Diabetes Sister   . Clotting disorder Sister    . Heart disease Sister   . Colon cancer Neg Hx   . Esophageal cancer Neg Hx   . Diabetes Sister   . Heart disease Sister     Social History   Tobacco Use  . Smoking status: Former Smoker    Packs/day: 0.12    Years: 50.00    Pack years: 6.00    Types: Cigarettes    Start date: 10/27/2014    Quit date: 2019    Years since quitting: 3.3  . Smokeless tobacco: Never Used  Vaping Use  . Vaping Use: Never used  Substance Use Topics  . Alcohol use: Not Currently    Comment: used to drink constantly; now maybe 1/2 beer qod"  . Drug use: Never    Types: "Crack" cocaine    Comment: "stopped ~ 7-8 yrs ago yr ago"    Home Medications Prior to Admission medications   Medication Sig Start Date End Date Taking? Authorizing Provider  amiodarone (PACERONE) 200 MG  tablet Take 200 mg by mouth 2 (two) times daily.    [provider]  apixaban (ELIQUIS) 5 MG TABS tablet Take 5 mg by mouth 2 (two) times daily with a meal. Patient not taking: Reported on 06/03/2020    [provider]  aspirin EC 81 MG tablet Take 1 tablet (81 mg total) by mouth daily. 07/01/12   Rai, Vernelle Emerald, MD  atorvastatin (LIPITOR) 40 MG tablet Take 40 mg by mouth at bedtime.     [provider]  docusate sodium (COLACE) 100 MG capsule Take 1 capsule (100 mg total) by mouth 2 (two) times daily. 08/04/19   Geradine Girt, DO  folic acid (FOLVITE) 1 MG tablet Take 1 mg by mouth daily.     [provider]  gabapentin (NEURONTIN) 300 MG capsule Take 300 mg by mouth 2 (two) times daily. 10/17/20   [provider]    Allergies    Patient has no known allergies.  Review of Systems   Review of Systems  Unable to perform ROS: Mental status change    Physical Exam Updated Vital Signs BP (!) 94/54   Pulse 72   Temp 97.8 F (36.6 C) (Oral)   Resp 20   SpO2 95%   Physical Exam Vitals and nursing note reviewed. Exam conducted with a chaperone present.  Constitutional:       General: He is in acute distress.     Appearance: He is toxic-appearing.     Interventions: Nasal cannula in place.     Comments: Nasal trumpet  HENT:     Head: Normocephalic and atraumatic.     Nose: Nose normal.     Mouth/Throat:     Mouth: Mucous membranes are moist.     Dentition: Abnormal dentition. Dental tenderness and dental caries present.     Pharynx: Oropharynx is clear. No oropharyngeal exudate or posterior oropharyngeal erythema.  Eyes:     General: Lids are normal.        Right eye: No discharge.        Left eye: No discharge.     Conjunctiva/sclera: Conjunctivae normal.     Pupils:     Right eye: Pupil is round, reactive and not sluggish.     Left eye: Pupil is not reactive.   Neck:     Trachea: Trachea normal.  Cardiovascular:     Rate and Rhythm: Normal rate and regular rhythm.     Pulses: Normal pulses.     Heart sounds: Normal heart sounds.  Pulmonary:     Effort: Tachypnea, accessory muscle usage and retractions present.     Breath sounds: Decreased air movement present. Examination of the left-upper field reveals rhonchi. Examination of the right-middle field reveals rhonchi. Examination of the left-middle field reveals rhonchi. Examination of the right-lower field reveals decreased breath sounds and rales. Examination of the left-lower field reveals decreased breath sounds and rales. Decreased breath sounds, rhonchi and rales present.  Chest:     Chest wall: No mass, lacerations, deformity, swelling, crepitus or edema.  Abdominal:     General: Bowel sounds are normal. There is no distension.     Palpations: Abdomen is soft.     Tenderness: There is no abdominal tenderness.  Musculoskeletal:        General: No deformity.     Cervical back: Neck supple. No rigidity or crepitus. No pain with movement, spinous process tenderness or muscular tenderness.       Legs:  Right Lower Extremity: Right leg is amputated above knee.     Left Lower Extremity: Left  leg is amputated above knee.  Lymphadenopathy:     Cervical: No cervical adenopathy.  Skin:    General: Skin is warm and dry.  Neurological:     Mental Status: Mental status is at baseline. He is lethargic.     GCS: GCS eye subscore is 1. GCS verbal subscore is 1. GCS motor subscore is 1.  Psychiatric:        Mood and Affect: Mood normal.     ED Results / Procedures / Treatments   Labs (all labs ordered are listed, but only abnormal results are displayed) Labs Reviewed  LACTIC ACID, PLASMA - Abnormal; Notable for the following components:      Result Value   Lactic Acid, Venous 8.9 (*)    All other components within normal limits  COMPREHENSIVE METABOLIC PANEL - Abnormal; Notable for the following components:   Chloride 113 (*)    CO2 13 (*)    Glucose, Bld 143 (*)    BUN 50 (*)    Creatinine, Ser 2.80 (*)    Calcium 7.2 (*)    Total Protein 5.7 (*)    Albumin 1.4 (*)    AST 352 (*)    ALT 71 (*)    Total Bilirubin 2.2 (*)    GFR, Estimated 24 (*)    All other components within normal limits  CBC WITH DIFFERENTIAL/PLATELET - Abnormal; Notable for the following components:   WBC 14.4 (*)    RBC 3.66 (*)    Hemoglobin 9.0 (*)    HCT 25.6 (*)    MCV 69.9 (*)    MCH 24.6 (*)    Neutro Abs 13.1 (*)    Abs Immature Granulocytes 0.16 (*)    All other components within normal limits  PROTIME-INR - Abnormal; Notable for the following components:   Prothrombin Time 22.1 (*)    INR 1.9 (*)    All other components within normal limits  CBG MONITORING, ED - Abnormal; Notable for the following components:   Glucose-Capillary 105 (*)    All other components within normal limits  URINE CULTURE  CULTURE, BLOOD (ROUTINE X 2)  CULTURE, BLOOD (ROUTINE X 2)  RESP PANEL BY RT-PCR (FLU A&B, COVID) ARPGX2  APTT  LACTIC ACID, PLASMA  URINALYSIS, ROUTINE W REFLEX MICROSCOPIC  CBG MONITORING, ED  CBG MONITORING, ED  CBG MONITORING, ED    EKG EKG  Interpretation  Date/Time:  01-14-21 12:59:55 EDT Ventricular Rate:  92 PR Interval:  174 QRS Duration: 93 QT Interval:  362 QTC Calculation: 448 R Axis:   91 Text Interpretation: Sinus rhythm with first degree AV block and sinus arrhythmia and PVC Right axis deviation Borderline low voltage, extremity leads Posterior infarct, old No significant change since last tracing Confirmed by Calvert Cantor [phone]) on 01-14-2021 1:13:37 PM   Radiology CT Abdomen Pelvis Wo Contrast  Result Date: 2021/01/14 CLINICAL DATA:  Unresponsive. Abdominal distension. Decubitus ulcer. EXAM: CT ABDOMEN AND PELVIS WITHOUT CONTRAST TECHNIQUE: Multidetector CT imaging of the abdomen and pelvis was performed following the standard protocol without IV contrast. COMPARISON:  None. FINDINGS: Lower chest: There are moderate-sized bilateral pleural effusions, right larger than left with significant overlying atelectasis. There is also diffuse interstitial thickening and ground-glass opacity suggesting pulmonary edema. No pneumothorax. No pulmonary lesions. The heart is borderline enlarged. Aortic and coronary artery calcifications are noted. Surgical changes from coronary  artery bypass surgery. Hepatobiliary: No hepatic lesions are identified. No intrahepatic biliary dilatation. Layering high attenuation material in the gallbladder likely gallstones. No common bile duct dilatation. Pancreas: Pancreatic atrophy but no mass, inflammation or ductal dilatation. Spleen: Normal size.  No focal lesions. Adrenals/Urinary Tract: The adrenal glands and kidneys are unremarkable. The bladder is unremarkable. Stomach/Bowel: The stomach, duodenum, small bowel and colon are grossly normal. No obvious inflammatory process, mass lesions or obstructive findings. Vascular/Lymphatic: Advanced atherosclerotic calcifications involving the aorta and branch vessels but no aneurysm. No mesenteric or retroperitoneal mass or adenopathy.  Reproductive: Mild prostate gland enlargement. The seminal vesicles are normal. Other: Large volume abdominal/pelvic ascites along with mesenteric edema. No free air is identified. Musculoskeletal: There is a sacral decubitus ulcer extending down to the bone with probable underlying osteomyelitis involving the lower sacrum and coccyx. There is a large rounded hyperdense lesion at and below the level of the left ischial tuberosity. This measures 7.6 x 7.0 cm and measures 47 Hounsfield units. This could be a soft tissue mass or possibly a hematoma. I do not see any definite destructive bony changes involving the ischial tuberosity. Both hips are normally located. No findings to suggest septic arthritis or osteomyelitis. Marked fatty atrophy of the pelvic and hip musculature. IMPRESSION: 1. 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 2. 7.6 x 7.0 cm rounded hyperdense lesion at and below the level of the left ischial tuberosity. This could be a soft tissue mass or possibly a hematoma. 3. Large volume abdominal/pelvic ascites and mesenteric edema. 4. Moderate-sized bilateral pleural effusions, right larger than left with significant overlying atelectasis. Probable pulmonary edema. 5. Cholelithiasis. 6. Advanced atherosclerotic calcifications involving the aorta and branch vessels. 7. Aortic atherosclerosis. Aortic Atherosclerosis (ICD10-I70.0). Electronically Signed   By: Marijo Sanes M.D.   On: 01/20/2021 15:10   CT Head Wo Contrast  Result Date: Jan 20, 2021 CLINICAL DATA:  Altered mental status. EXAM: CT HEAD WITHOUT CONTRAST TECHNIQUE: Contiguous axial images were obtained from the base of the skull through the vertex without intravenous contrast. COMPARISON:  01/05/2001 FINDINGS: Brain: Stable mildly enlarged ventricles and cortical sulci. Stable moderate patchy white matter low density in both cerebral hemispheres. Stable old left frontal and parietal  infarcts. Stable old right corona radiata lacunar infarct. Stable right cerebellar hemisphere infarct. No intracranial hemorrhage, mass lesion or CT evidence of acute infarction. Vascular: No hyperdense vessel or unexpected calcification. Skull: Normal. Negative for fracture or focal lesion. Sinuses/Orbits: Previously described old medial right orbital wall blowout fracture. Mild left maxillary and left sphenoid sinus mucosal thickening. Other: None. IMPRESSION: A 1. No acute abnormality. 2. Stable atrophy, chronic small vessel white matter ischemic changes and old infarcts. 3. Mild chronic sinusitis. Electronically Signed   By: Claudie Revering M.D.   On: 20-Jan-2021 15:04   DG Chest Port 1 View  Result Date: Jan 20, 2021 CLINICAL DATA:  Patient unresponsive.  Possible sepsis. EXAM: PORTABLE CHEST 1 VIEW COMPARISON:  03/15/2019 FINDINGS: Stable surgical changes from coronary artery bypass surgery. The heart is within normal limits in size given the AP projection and portable technique. There is tortuosity and calcification of the thoracic aorta. There are bilateral pleural effusions, right larger than left with overlying infiltrates or atelectasis. The bony thorax is intact. IMPRESSION: Bilateral pleural effusions with overlying infiltrates or atelectasis. Electronically Signed   By: Marijo Sanes M.D.   On: 2021/01/20 13:19    Procedures .Critical Care Performed by: Emeline Darling, PA-C Authorized by:  Aleta Manternach, Gypsy Balsam, PA-C   Critical care provider statement:    Critical care time (minutes):  45   Critical care was necessary to treat or prevent imminent or life-threatening deterioration of the following conditions:  Sepsis and respiratory failure   Critical care was time spent personally by me on the following activities:  Discussions with consultants, evaluation of patient's response to treatment, examination of patient, ordering and performing treatments and interventions, ordering and review  of laboratory studies, ordering and review of radiographic studies, pulse oximetry, re-evaluation of patient's condition, obtaining history from patient or surrogate and review of old charts    Medications Ordered in ED Medications  lactated ringers infusion ( Intravenous New Bag/Given 2021-01-16 1518)  lactated ringers bolus 1,000 mL (1,000 mLs Intravenous New Bag/Given Jan 16, 2021 1517)    And  lactated ringers bolus 250 mL (250 mLs Intravenous New Bag/Given 01-16-21 1517)  ceFEPIme (MAXIPIME) 2 g in sodium chloride 0.9 % 100 mL IVPB (2 g Intravenous New Bag/Given 01/16/21 1516)  metroNIDAZOLE (FLAGYL) IVPB 500 mg (500 mg Intravenous New Bag/Given 01/16/2021 1517)  vancomycin (VANCOCIN) IVPB 1000 mg/200 mL premix (1,000 mg Intravenous New Bag/Given Jan 16, 2021 1519)  lactated ringers bolus 1,000 mL (1,000 mLs Intravenous New Bag/Given Jan 16, 2021 1333)  dextrose 50 % solution 50 mL (50 mLs Intravenous Given 2021-01-16 1531)    ED Course  I have reviewed the triage vital signs and the nursing notes.  Pertinent labs & imaging results that were available during my care of the patient were reviewed by me and considered in my medical decision making (see chart for details).    MDM Rules/Calculators/A&P                         70 year old male who presents via EMS with concern for becoming unresponsive at home. The differential diagnosis for AMS is extensive and includes, but is not limited to:  [name] Drug overdose - opioids, alcohol, sedatives, antipsychotics, drug withdrawal, others . Metabolic: hypoxia, hypoglycemia, hyperglycemia, hypercalcemia, hypernatremia, hyponatremia, uremia, hepatic encephalopathy, hypothyroidism, hyperthyroidism, vitamin B12 or thiamine deficiency, carbon monoxide poisoning, Wilson's disease, Lactic acidosis, DKA/HHOS . Infectious: meningitis, encephalitis, bacteremia/sepsis, urinary tract infection, pneumonia, neurosyphilis . Structural: Space-occupying lesion, (brain tumor, subdural  hematoma, hydrocephalus,) . Vascular: stroke, subarachnoid hemorrhage, coronary ischemia, hypertensive encephalopathy, CNS vasculitis, thrombotic thrombocytopenic purpura, disseminated intravascular coagulation, hyperviscosity . Psychiatric: Schizophrenia, depression; Other: Seizure, hypothermia, heat stroke, ICU psychosis, dementia -"sundowning."  Patient tachypneic and requiring 5 L of supplemental oxygen by nasal cannula to maintain his oxygen saturations.  Additionally hypoglycemic with EMS, improved after back of D50.  Patient borderline hypotensive.  Cardiac exam is normal, pulmonary exam with rhonchi and rales throughout the lung fields bilaterally.  Abdomen is soft and nondistended, however tenderness cannot be evaluated as patient is obtunded.  GCS of 3 at time of my initial exam.  AKA's bilaterally.  Large sacral decubitus ulcer caked in combination of topical treatment and discharge from the wound.  Patient appears toxic.  CBC with leukocytosis of 14,000, anemia with hemoglobin of 9, near patient's baseline.  CMP with significant increase in BUN/creatinine.  50/2.8, patient's baseline creatinine is 1.  Additionally patient has transaminitis and elevated bilirubin to 2.2.  Lactic acid significantly elevated to 8.9.  Sepsis order set for fluid resuscitation administered and broad-spectrum antibiotics ordered.  CT head negative for acute intracranial abnormality.  Chest x-ray with bilateral pleural effusions with overlying infiltrates versus atelectasis.  CT of the abdomen pelvis revealed moderate  sized bilateral pleural effusions right > left.  Additionally there is a large sacral decubitus ulcer with underlying osteomyelitis involving the lower sacrum and coccyx.  Additionally there is a 7.6 x 7 cm round hyperdense lesion at the level of the left ischial tuberosity which could be a soft tissue mass or hematoma.  Large volume abdominal pelvic ascites and mesenteric edema.  Severity of patient's  condition discussed with his power of attorney and niece at the bedside, [name].  She voiced understanding and confirmed patient is to be DNR/DNI.  She did voice that she was to proceed with IV fluid resuscitation and antibiotics, though she also voiced understanding that his condition is critical and his likelihood of surviving this infection is low.  Consult was placed to hospitalist, Dr. Marylyn Ishihara, was agreeable to seeing this patient admitting him to his service.  CODE STATUS DNR/DNI communicated with hospitalist at time of admission.  Patient's family has been made aware.  This chart was dictated using voice recognition software, Dragon. Despite the best efforts of this provider to proofread and correct errors, errors may still occur which can change documentation meaning.  Final Clinical Impression(s) / ED Diagnoses Final diagnoses:  None    Rx / DC Orders ED Discharge Orders    None       Aura Dials 01/21/21 1545    Truddie Hidden, MD 01/13/21 [phone]

## 2021-02-01 NOTE — Progress Notes (Signed)
Spoke with family about worsening condition. They would like to d/c abx and fluid support. They are looking for comfort care at this time. PRN morphin/ativan will be made available. Labs draws, abx, fluids will be d/c'd. He is not expected to survive the night.   Jonnie Finner, DO

## 2021-02-01 NOTE — H&P (Signed)
History and Physical    Nasiem Sidener [id number] DOB: December 17, 1950 DOA: 2021/01/31  PCP: System, Provider Not In  Patient coming from: Home  Chief Complaint: Unresponsive at home  HPI: Cleve Beyerle is a 70 y.o. male with medical history significant of dementia, a fib, HLD, CVA. Presenting with acute metabolic encephalopathy. Patient not responsive. History is from niece. She reports that the patient was recently seen for falls. He had a head wound that was addressed and he was sent home w/o complaint. He was doing ok at home in his normal state of health until 2 days ago. His niece notes that he fell out of his chair. He said he was ok, but per her account, he seemed to go downhill from there. His PO intake decreased. His interactions slowed. Last night he was still talking with his niece before she went to work. When she came home, she found him unresponsive. She tried checking for a pulse, but couldn't find one. She called his name, but he did not respond. She called EMS for help. She denies any other aggravating or alleviating factors.    ED Course: EMS found him to be hypoxic, but breathing on his own. His SpO2 was in the 60's. His glucose was in the 30s. HHe was given meds and O2 support in the field. In the ED, his glucose had improved to 95. He was found to be septic. He was started on fluid boluses and broad spec abx. His niece notified the team that he is DNR and she wished him to remain that way.   Review of Systems:  Unable to obtain d/t mentation  PMHx Past Medical History:  Diagnosis Date  . Alcoholism ([place])   . Blind left eye ~ 2009  . Carotid artery occlusion   . CHF (congestive heart failure) ([place])   . Complication of anesthesia   . Coronary artery disease   . CVA (cerebral infarction)   . Dementia ([place])   . Diabetes mellitus without complication ([place])   . DVT (deep venous thrombosis) ([place])   . High cholesterol   . Hx of cardiovascular stress test    Lexiscan Myoview  4/16: Anterolateral infarct with very mild peri-infarct ischemia, EF 42%; intermediate risk >>medical therapy recommended  . Hyperlipidemia   . Hypertension   . Myocardial infarction ([place]) ~ 2011  . PAD (peripheral artery disease) ([facility])    non-viable tissue RLE  . Paroxysmal atrial fibrillation ([facility])   . Peripheral vascular disease ([place])   . Pre-diabetes   . 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 Shortness of breath dyspnea    occ  . Stroke [facility]) ~ 2009   "left eye; totally blind there"     PSHx Past Surgical History:  Procedure Laterality Date  . ABDOMINAL AORTOGRAM W/LOWER EXTREMITY Bilateral 03/20/2019   Procedure: ABDOMINAL AORTOGRAM W/LOWER EXTREMITY;  Surgeon: Serafina Mitchell, MD;  Location: [facility];  Service: Cardiovascular;  Laterality: Bilateral;  . AMPUTATION Right 05/08/2019   Procedure: AMPUTATION ABOVE KNEE RIGHT;  Surgeon: Serafina Mitchell, MD;  Location: [facility];  Service: Vascular;  Laterality: Right;  . AMPUTATION Left 07/29/2019   Procedure: LEFT ABOVE KNEE AMPUTATION;  Surgeon: Newt Minion, MD;  Location: [facility];  Service: Orthopedics;  Laterality: Left;  . APPENDECTOMY    . BACK SURGERY     1998-99 had disc removed from back  . CARDIAC CATHETERIZATION    . CAROTID ENDARTERECTOMY Left 12/28/2014  . CORONARY  ARTERY BYPASS GRAFT  ?2011  . ENDARTERECTOMY Left 12/28/2014   Procedure: LEFT CAROTID ENDARTERECTOMY WITH PATCH ANGIOPLASTY;  Surgeon: Elam Dutch, MD;  Location: [place];  Service: Vascular;  Laterality: Left;  . heart bypass    . PERIPHERAL ARTERIAL STENT GRAFT  2000's   BLE    SocHx  reports that he quit smoking about 3 years ago. His smoking use included cigarettes. He started smoking about 6 years ago. He has a 6.00 pack-year smoking history. He has never used smokeless tobacco. He reports previous alcohol use. He reports that he does not use drugs.  No Known Allergies  FamHx Family History  Problem Relation Age of Onset   . Lung cancer Sister   . Heart disease Brother   . Diabetes Sister   . Clotting disorder Sister   . Heart disease Sister   . Colon cancer Neg Hx   . Esophageal cancer Neg Hx   . Diabetes Sister   . Heart disease Sister     Prior to Admission medications   Medication Sig Start Date End Date Taking? Authorizing Provider  amiodarone (PACERONE) 200 MG tablet Take 200 mg by mouth 2 (two) times daily.    [provider]  apixaban (ELIQUIS) 5 MG TABS tablet Take 5 mg by mouth 2 (two) times daily with a meal. Patient not taking: Reported on 06/03/2020    [provider]  aspirin EC 81 MG tablet Take 1 tablet (81 mg total) by mouth daily. 07/01/12   Rai, Vernelle Emerald, MD  atorvastatin (LIPITOR) 40 MG tablet Take 40 mg by mouth at bedtime.     [provider]  docusate sodium (COLACE) 100 MG capsule Take 1 capsule (100 mg total) by mouth 2 (two) times daily. 08/04/19   Geradine Girt, DO  folic acid (FOLVITE) 1 MG tablet Take 1 mg by mouth daily.     [provider]  gabapentin (NEURONTIN) 300 MG capsule Take 300 mg by mouth 2 (two) times daily. 10/17/20   [provider]    Physical Exam: Vitals:   02-05-21 1430 02-05-2021 1448 02/05/21 1500 February 05, 2021 1515  BP: (!) 100/56 (!) 97/48 (!) 104/57 (!) 93/52  Pulse: (!) 28 76 77 72  Resp: (!) 25 (!) 26 (!) 27 (!) 23  Temp:      TempSrc:      SpO2: (!) 88% 94% 92% 90%    General: 70 y.o. ill appearing male resting in bed Eyes: round, non-reactive, normal sclera, he does blink ENMT: Nares patent w/o discharge, ears w/o discharge/lesions/ulcers, he is on a NRB Neck:  trachea midline Cardiovascular: RRR, +S1, S2, no m/g/r Respiratory: course +rhonchi/rhales, increased WOB on NRB GI: BS hypokinetic, ND, soft, no masses noted, no organomegaly noted MSK: No e/c/c; b/l AKA Skin: sacral ulcer noted Neuro: obtunded, responsive only to noxious stimuli  Labs on Admission: I have personally reviewed following  labs and imaging studies  CBC: Recent Labs  Lab 02/05/2021 1245  WBC 14.4*  NEUTROABS 13.1*  HGB 9.0*  HCT 25.6*  MCV 69.9*  PLT [id number]   Basic Metabolic Panel: Recent Labs  Lab 05-Feb-2021 1245  NA 141  K 5.0  CL 113*  CO2 13*  GLUCOSE 143*  BUN 50*  CREATININE 2.80*  CALCIUM 7.2*   GFR: CrCl cannot be calculated (Unknown ideal weight.). Liver Function Tests: Recent Labs  Lab 05-Feb-2021 1245  AST 352*  ALT 71*  ALKPHOS 86  BILITOT 2.2*  PROT 5.7*  ALBUMIN 1.4*   No results for input(s): LIPASE, AMYLASE in the last 168 hours. No results for input(s): AMMONIA in the last 168 hours. Coagulation Profile: Recent Labs  Lab 2021-01-28 1245  INR 1.9*   Cardiac Enzymes: No results for input(s): CKTOTAL, CKMB, CKMBINDEX, TROPONINI in the last 168 hours. BNP (last 3 results) No results for input(s): PROBNP in the last 8760 hours. HbA1C: No results for input(s): HGBA1C in the last 72 hours. CBG: Recent Labs  Lab 01-28-21 1233 01-28-2021 1327 01/28/21 1503  GLUCAP 95 105* 75   Lipid Profile: No results for input(s): CHOL, HDL, LDLCALC, TRIG, CHOLHDL, LDLDIRECT in the last 72 hours. Thyroid Function Tests: No results for input(s): TSH, T4TOTAL, FREET4, T3FREE, THYROIDAB in the last 72 hours. Anemia Panel: No results for input(s): VITAMINB12, FOLATE, FERRITIN, TIBC, IRON, RETICCTPCT in the last 72 hours. Urine analysis:    Component Value Date/Time   COLORURINE AMBER (A) 07/26/2019 2217   APPEARANCEUR CLEAR 07/26/2019 2217   LABSPEC 1.035 (H) 07/26/2019 2217   PHURINE 5.0 07/26/2019 2217   GLUCOSEU NEGATIVE 07/26/2019 2217   [place] NEGATIVE 07/26/2019 2217   [place] NEGATIVE 07/26/2019 2217   [place] NEGATIVE 07/26/2019 2217   PROTEINUR NEGATIVE 07/26/2019 2217   UROBILINOGEN 1.0 12/17/2014 1443   NITRITE NEGATIVE 07/26/2019 2217   LEUKOCYTESUR NEGATIVE 07/26/2019 2217    Radiological Exams on Admission: CT Abdomen Pelvis Wo Contrast  Result Date:  Jan 28, 2021 CLINICAL DATA:  Unresponsive. Abdominal distension. Decubitus ulcer. EXAM: CT ABDOMEN AND PELVIS WITHOUT CONTRAST TECHNIQUE: Multidetector CT imaging of the abdomen and pelvis was performed following the standard protocol without IV contrast. COMPARISON:  None. FINDINGS: Lower chest: There are moderate-sized bilateral pleural effusions, right larger than left with significant overlying atelectasis. There is also diffuse interstitial thickening and ground-glass opacity suggesting pulmonary edema. No pneumothorax. No pulmonary lesions. The heart is borderline enlarged. Aortic and coronary artery calcifications are noted. 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 Hepatobiliary: No hepatic lesions are identified. No intrahepatic biliary dilatation. Layering high attenuation material in the gallbladder likely gallstones. No common bile duct dilatation. Pancreas: Pancreatic atrophy but no mass, inflammation or ductal dilatation. Spleen: Normal size.  No focal lesions. Adrenals/Urinary Tract: The adrenal glands and kidneys are unremarkable. The bladder is unremarkable. Stomach/Bowel: The stomach, duodenum, small bowel and colon are grossly normal. No obvious inflammatory process, mass lesions or obstructive findings. Vascular/Lymphatic: Advanced atherosclerotic calcifications involving the aorta and branch vessels but no aneurysm. No mesenteric or retroperitoneal mass or adenopathy. Reproductive: Mild prostate gland enlargement. The seminal vesicles are normal. Other: Large volume abdominal/pelvic ascites along with mesenteric edema. No free air is identified. Musculoskeletal: There is a sacral decubitus ulcer extending down to the bone with probable underlying osteomyelitis involving the lower sacrum and coccyx. There is a large rounded hyperdense lesion at and below the level of the left ischial tuberosity. This measures 7.6 x 7.0 cm and measures 47 Hounsfield units. This could be a soft tissue  mass or possibly a hematoma. I do not see any definite destructive bony changes involving the ischial tuberosity. Both hips are normally located. No findings to suggest septic arthritis or osteomyelitis. Marked fatty atrophy of the pelvic and hip musculature. IMPRESSION: 1. 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 2. 7.6 x 7.0 cm rounded hyperdense lesion at and below the level of the left ischial tuberosity. This could be a soft tissue mass or possibly a hematoma. 3. Large volume abdominal/pelvic ascites and  mesenteric edema. 4. Moderate-sized bilateral pleural effusions, right larger than left with significant overlying atelectasis. Probable pulmonary edema. 5. Cholelithiasis. 6. Advanced atherosclerotic calcifications involving the aorta and branch vessels. 7. Aortic atherosclerosis. Aortic Atherosclerosis (ICD10-I70.0). Electronically Signed   By: Marijo Sanes M.D.   On: 2021-01-25 15:10   CT Head Wo Contrast  Result Date: Jan 25, 2021 CLINICAL DATA:  Altered mental status. EXAM: CT HEAD WITHOUT CONTRAST TECHNIQUE: Contiguous axial images were obtained from the base of the skull through the vertex without intravenous contrast. COMPARISON:  01/05/2001 FINDINGS: Brain: Stable mildly enlarged ventricles and cortical sulci. Stable moderate patchy white matter low density in both cerebral hemispheres. 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 Stable old right corona radiata lacunar infarct. Stable right cerebellar hemisphere infarct. No intracranial hemorrhage, mass lesion or CT evidence of acute infarction. Vascular: No hyperdense vessel or unexpected calcification. Skull: Normal. Negative for fracture or focal lesion. Sinuses/Orbits: Previously described old medial right orbital wall blowout fracture. Mild left maxillary and left sphenoid sinus mucosal thickening. Other: None. IMPRESSION: A 1. No acute abnormality. 2. Stable atrophy, chronic small  vessel white matter ischemic changes and old infarcts. 3. Mild chronic sinusitis. Electronically Signed   By: Claudie Revering M.D.   On: 01/25/21 15:04   DG Chest Port 1 View  Result Date: Jan 25, 2021 CLINICAL DATA:  Patient unresponsive.  Possible sepsis. EXAM: PORTABLE CHEST 1 VIEW COMPARISON:  03/15/2019 FINDINGS: Stable surgical changes from coronary artery bypass surgery. The heart is within normal limits in size given the AP projection and portable technique. There is tortuosity and calcification of the thoracic aorta. There are bilateral pleural effusions, right larger than left with overlying infiltrates or atelectasis. The bony thorax is intact. IMPRESSION: Bilateral pleural effusions with overlying infiltrates or atelectasis. Electronically Signed   By: Marijo Sanes M.D.   On: 01-25-21 13:19   Assessment/Plan Severe sepsis     - admit to inpt, SDU     - patient remains DNR per niece     - has had 2250cc in fluid boluses (fully fluid resuscitated); however, his O2 requirement has now increased to 10L NRB; initial lactic acid was 8.9; rpt lactic acid ordered/pending     - keep LR at 100cc for now     - continue broad spec abx: source is like sacral osteo, but he has large volume ascites     - spoke with family about plan going forward (see below) as he is severely ill and there is a high likelihood he will not survive this admission; they would like to continue abx for now and are not looking for anything more aggressive at this time     - will add stress dose solucortef to his regimen     - sepsis: tachypnea, elevated WBC, elevated lactic acid, AMS, AKI; source: likely sacral osteo vs peritonitis  Acute hypoxic respiratory failure     - fluid overload     - now on NRB     - DNR confirmed     - stuck in that we have to give fluid for his sepsis     - continue fluids for now  AKI     - likely as a fxn of sepsis     - imaging not suggestive of obstruction     - avoid nephrotoxins,  continue fluids, watch UOP  [place] metabolic acidosis Lactic acidosis     - likely as a fxn of sepsis     - treat infection,  continue fluids as able to tolerate from a respiratory standpoint  Acute metabolic encephalopathy     - likely as a fxn of sepsis     - treat infection     - maintain glucose levels  Hypoglycemia     - likely as a fxn of sepsis     - treat infection     - maintain glucose levels, follow q4 glucose checks  Elevated LFTs Elevated bilirubin     - likely as a fxn of sepsis     - treat infection     - continue fluids     - imaging w/o CBD dilation or liver lesions     - check fractionated bili  Pleural effusions     - no intervention for now, follow  Ascites     - not sure if this is infected or not; but broad spec abx will cover; not looking for paracentesis just yet; follow  Peudohypocalcemia     - corrected Ca2+ is 9.3 (normal); follow  Microcytic anemia     - check iron studies, follow H&H as there is a possible hematoma at the left ischial tuberosity  Hx of a fib     - EKG w/o clear P waves     - on eliquis, amiodarone at home; holding both right now  HLD     - hold statin d/t LFTs and he's obtunded  Goals of care     - niece has confirmed DNR; however, she wants antibiotics for his infection for now; not escalating beyond this at this time     - will ask PC to see her; he's not likely to survive this admission    DVT prophylaxis: none, question of active bleed  Code Status: DNR, confirmed w/ niece Family Communication: Spoke w/ niece at bedside Consults called: Palliative Care  Status is: Inpatient  Remains inpatient appropriate because:Inpatient level of care appropriate due to severity of illness   Dispo: The patient is from: Home              Anticipated d/c is to: TBD              Patient currently is not medically stable to d/c.   Difficult to place patient No  Time spent coordinating admission: 75 minutes  [facility]  If 7PM-7AM, please contact night-coverage [email or web address]  14-Jan-2021, 3:27 PM

## 2021-02-01 NOTE — Death Summary Note (Addendum)
DEATH SUMMARY   Patient Details  Name: [name] MRN: [id number] DOB: Aug 23, 1951  Admission/Discharge Information   Admit Date:  2021-01-26  Date of Death: Date of Death: 01/26/21  Time of Death: Time of Death: 12-03-56  Length of Stay: 1  Referring Physician: System, Provider Not In   Reason(s) for Hospitalization  Patient found unresponsive at home.  Diagnoses  Preliminary cause of death:  Septic shock secondary to osteomyelitis of sacrum and bacteremia Secondary Diagnoses (including complications and co-morbidities):  Acute hypoxic respiratory failure Osteomyelitis of sacrum Bacteroides fragilis bacteremia Proteus bacteremia AKI Lactic acidosis Hypoglycemia Large volume ascites Microcytic anemia CAD PAD HTN HLD Vascular dementia DM2 Paroxysmal a fib CKD4 Stage 4 sacral pressure ulcer   Brief Hospital Course (including significant findings, care, treatment, and services provided and events leading to death)  [name] is a 70 y.o. year old male who presented with acute metabolic encephalopathy. Patient was responsive only to noxious stimuli during interview. History is from niece. She reports that the patient was recently seen for falls. He had a head wound that was addressed and he was sent home w/o complaint. He was doing ok at home in his normal state of health until 2 days ago. His niece notes that he fell out of his chair. He said he was ok, but per her account, he seemed to go downhill from there. His PO intake decreased. His interactions slowed. Last night he was still talking with his niece before she went to work. When she came home, she found him unresponsive. She tried checking for a pulse, but couldn't find one. She called his name, but he did not respond. She called EMS for help. She denies any other aggravating or alleviating factors.  He was found to be in septic shock. Fluid resuscitation and broad spectrum antibiotics were initiated. Given his  presenting condition a conversation about his mortality was had between the EDP and the patient's niece (the family member that he lives with). The niece confirmed that he was DNR. She confirmed that she wanted antibiotics for his treatment and nothing more aggressive. He was moved to the SDU. Despite significant fluid boluses beyond 30cc/kg, he remained hypotensive. His oxygen demands worsened to the point where he needed 15L NRB. I discussed his prognosis again with family at bedside. At that time, they agreed comfort care measures were the best option. Aggressive treatment was discontinued. He was made comfort care measures only. The patient expired shortly after at 1858hrs.     Pertinent Labs and Studies  Significant Diagnostic Studies CT Abdomen Pelvis Wo Contrast  Result Date: 01-26-2021 CLINICAL DATA:  Unresponsive. Abdominal distension. Decubitus ulcer. EXAM: CT ABDOMEN AND PELVIS WITHOUT CONTRAST TECHNIQUE: Multidetector CT imaging of the abdomen and pelvis was performed following the standard protocol without IV contrast. COMPARISON:  None. FINDINGS: Lower chest: There are moderate-sized bilateral pleural effusions, right larger than left with significant overlying atelectasis. There is also diffuse interstitial thickening and ground-glass opacity suggesting pulmonary edema. No pneumothorax. No pulmonary lesions. The heart is borderline enlarged. Aortic and coronary artery calcifications are noted. 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 Hepatobiliary: No hepatic lesions are identified. No intrahepatic biliary dilatation. Layering high attenuation material in the gallbladder likely gallstones. No common bile duct dilatation. Pancreas: Pancreatic atrophy but no mass, inflammation or ductal dilatation. Spleen: Normal size.  No focal lesions. Adrenals/Urinary Tract: The adrenal glands and kidneys are unremarkable. The bladder is unremarkable. Stomach/Bowel: The stomach, duodenum, small  bowel and colon  are grossly normal. No obvious inflammatory process, mass lesions or obstructive findings. Vascular/Lymphatic: Advanced atherosclerotic calcifications involving the aorta and branch vessels but no aneurysm. No mesenteric or retroperitoneal mass or adenopathy. Reproductive: Mild prostate gland enlargement. The seminal vesicles are normal. Other: Large volume abdominal/pelvic ascites along with mesenteric edema. No free air is identified. Musculoskeletal: There is a sacral decubitus ulcer extending down to the bone with probable underlying osteomyelitis involving the lower sacrum and coccyx. There is a large rounded hyperdense lesion at and below the level of the left ischial tuberosity. This measures 7.6 x 7.0 cm and measures 47 Hounsfield units. This could be a soft tissue mass or possibly a hematoma. I do not see any definite destructive bony changes involving the ischial tuberosity. Both hips are normally located. No findings to suggest septic arthritis or osteomyelitis. Marked fatty atrophy of the pelvic and hip musculature. IMPRESSION: 1. 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 2. 7.6 x 7.0 cm rounded hyperdense lesion at and below the level of the left ischial tuberosity. This could be a soft tissue mass or possibly a hematoma. 3. Large volume abdominal/pelvic ascites and mesenteric edema. 4. Moderate-sized bilateral pleural effusions, right larger than left with significant overlying atelectasis. Probable pulmonary edema. 5. Cholelithiasis. 6. Advanced atherosclerotic calcifications involving the aorta and branch vessels. 7. Aortic atherosclerosis. Aortic Atherosclerosis (ICD10-I70.0). Electronically Signed   By: Marijo Sanes M.D.   On: 01-Feb-2021 15:10   CT Head Wo Contrast  Result Date: 02-01-21 CLINICAL DATA:  Altered mental status. EXAM: CT HEAD WITHOUT CONTRAST TECHNIQUE: Contiguous axial images were obtained from the base  of the skull through the vertex without intravenous contrast. COMPARISON:  01/05/2001 FINDINGS: Brain: Stable mildly enlarged ventricles and cortical sulci. Stable moderate patchy white matter low density in both cerebral hemispheres. 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 Stable old right corona radiata lacunar infarct. Stable right cerebellar hemisphere infarct. No intracranial hemorrhage, mass lesion or CT evidence of acute infarction. Vascular: No hyperdense vessel or unexpected calcification. Skull: Normal. Negative for fracture or focal lesion. Sinuses/Orbits: Previously described old medial right orbital wall blowout fracture. Mild left maxillary and left sphenoid sinus mucosal thickening. Other: None. IMPRESSION: A 1. No acute abnormality. 2. Stable atrophy, chronic small vessel white matter ischemic changes and old infarcts. 3. Mild chronic sinusitis. Electronically Signed   By: Claudie Revering M.D.   On: 02/01/21 15:04   CT Head Wo Contrast  Result Date: 01/05/2021 CLINICAL DATA:  Fall.  Facial trauma. EXAM: CT HEAD WITHOUT CONTRAST TECHNIQUE: Contiguous axial images were obtained from the base of the skull through the vertex without intravenous contrast. COMPARISON:  06/18/2018 FINDINGS: Brain: There is no evidence for acute hemorrhage, hydrocephalus, mass lesion, or abnormal extra-axial fluid collection. No definite CT evidence for acute infarction. Diffuse loss of parenchymal volume is consistent with atrophy. Patchy low attenuation in the deep hemispheric and periventricular white matter is nonspecific, but likely reflects chronic microvascular ischemic demyelination. Old infarcts again noted left frontal lobe and left posterior parietal lobe. Lacunar infarct in the right cerebellum is stable. Vascular: No hyperdense vessel or unexpected calcification. Skull: No evidence for fracture. No worrisome lytic or sclerotic lesion. Sinuses/Orbits: Old blowout fracture noted right medial orbital wall,  stable in the interval. The visualized paranasal sinuses and mastoid air cells are clear. Visualized portions of the globes and intraorbital fat are unremarkable. Other: None. IMPRESSION: 1. No acute intracranial abnormality. 2. Atrophy with chronic small vessel  white matter ischemic disease. 3. 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 4. Old right medial orbital wall blowout fracture. Electronically Signed   By: Misty Stanley M.D.   On: 01/05/2021 14:23   DG Chest Port 1 View  Result Date: 2021/01/25 CLINICAL DATA:  Patient unresponsive.  Possible sepsis. EXAM: PORTABLE CHEST 1 VIEW COMPARISON:  03/15/2019 FINDINGS: Stable surgical changes from coronary artery bypass surgery. The heart is within normal limits in size given the AP projection and portable technique. There is tortuosity and calcification of the thoracic aorta. There are bilateral pleural effusions, right larger than left with overlying infiltrates or atelectasis. The bony thorax is intact. IMPRESSION: Bilateral pleural effusions with overlying infiltrates or atelectasis. Electronically Signed   By: Marijo Sanes M.D.   On: 25-Jan-2021 13:19    Microbiology Recent Results (from the past 240 hour(s))  Blood Culture (routine x 2)     Status: None (Preliminary result)   Collection Time: 01/25/2021 12:53 PM   Specimen: BLOOD  Result Value Ref Range Status   Specimen Description   Final    BLOOD LEFT ANTECUBITAL Performed at [facility] [address] [place], [place] [address]    Special Requests   Final    BOTTLES DRAWN AEROBIC AND ANAEROBIC Blood Culture adequate volume Performed at [facility] [address], [place], [place] [address]    Culture  Setup Time   Final    GRAM NEGATIVE RODS IN BOTH AEROBIC AND ANAEROBIC BOTTLES Organism ID to follow GONE Performed at [facility], [place] [address], [place], [place] [address]    Culture PENDING  Incomplete    Report Status PENDING  Incomplete  Blood Culture ID Panel (Reflexed)     Status: Abnormal   Collection Time: Jan 25, 2021 12:53 PM  Result Value Ref Range Status   Enterococcus faecalis NOT DETECTED NOT DETECTED Final   Enterococcus Faecium NOT DETECTED NOT DETECTED Final   Listeria monocytogenes NOT DETECTED NOT DETECTED Final    Comment: PATIENT DISCHARGED OR EXPIRED   Staphylococcus species NOT DETECTED NOT DETECTED Final   Staphylococcus aureus (BCID) NOT DETECTED NOT DETECTED Final   Staphylococcus epidermidis NOT DETECTED NOT DETECTED Final   Staphylococcus lugdunensis NOT DETECTED NOT DETECTED Final   Streptococcus species NOT DETECTED NOT DETECTED Final   Streptococcus agalactiae NOT DETECTED NOT DETECTED Final   Streptococcus pneumoniae NOT DETECTED NOT DETECTED Final   Streptococcus pyogenes NOT DETECTED NOT DETECTED Final   A.calcoaceticus-baumannii NOT DETECTED NOT DETECTED Final   Bacteroides fragilis DETECTED (A) NOT DETECTED Final   Enterobacterales DETECTED (A) NOT DETECTED Final    Comment: Enterobacterales represent a large order of gram negative bacteria, not a single organism.   Enterobacter cloacae complex NOT DETECTED NOT DETECTED Final   Escherichia coli NOT DETECTED NOT DETECTED Final   Klebsiella aerogenes NOT DETECTED NOT DETECTED Final   Klebsiella oxytoca NOT DETECTED NOT DETECTED Final   Klebsiella pneumoniae NOT DETECTED NOT DETECTED Final   Proteus species DETECTED (A) NOT DETECTED Final    Comment: PATIENT DISCHARGED OR EXPIRED   Salmonella species NOT DETECTED NOT DETECTED Final   Serratia marcescens NOT DETECTED NOT DETECTED Final   Haemophilus influenzae NOT DETECTED NOT DETECTED Final   Neisseria meningitidis NOT DETECTED NOT DETECTED Final   Pseudomonas aeruginosa NOT DETECTED NOT DETECTED Final   Stenotrophomonas maltophilia NOT DETECTED NOT DETECTED Final   Candida albicans NOT DETECTED NOT DETECTED Final   Candida  auris NOT DETECTED NOT DETECTED  Final   Candida glabrata NOT DETECTED NOT DETECTED Final   Candida krusei NOT DETECTED NOT DETECTED Final   Candida parapsilosis NOT DETECTED NOT DETECTED Final   Candida tropicalis NOT DETECTED NOT DETECTED Final   Cryptococcus neoformans/gattii NOT DETECTED NOT DETECTED Final   CTX-M ESBL NOT DETECTED NOT DETECTED Final   Carbapenem resistance IMP NOT DETECTED NOT DETECTED Final   Carbapenem resistance KPC NOT DETECTED NOT DETECTED Final   Carbapenem resistance NDM NOT DETECTED NOT DETECTED Final   Carbapenem resist OXA 48 LIKE NOT DETECTED NOT DETECTED Final   Carbapenem resistance VIM NOT DETECTED NOT DETECTED Final    Comment: Performed at [facility], [place] [address] [place], [place] [address]  Resp Panel by RT-PCR (Flu A&B, Covid) Nasopharyngeal Swab     Status: None   Collection Time: 2021-01-18  3:27 PM   Specimen: Nasopharyngeal Swab; Nasopharyngeal(NP) swabs in vial transport medium  Result Value Ref Range Status   SARS Coronavirus 2 by RT PCR NEGATIVE NEGATIVE Final    Comment: (NOTE) SARS-CoV-2 target nucleic acids are NOT DETECTED.  The SARS-CoV-2 RNA is generally detectable in upper respiratory specimens during the acute phase of infection. The lowest concentration of SARS-CoV-2 viral copies this assay can detect is 138 copies/mL. A negative result does not preclude SARS-Cov-2 infection and should not be used as the sole basis for treatment or other patient management decisions. A negative result may occur with  improper specimen collection/handling, submission of specimen other than nasopharyngeal swab, presence of viral mutation(s) within the areas targeted by this assay, and inadequate number of viral copies(<138 copies/mL). A negative result must be combined with clinical observations, patient history, and epidemiological information. The expected result is Negative.  Fact Sheet for Patients:  EntrepreneurPulse.com.au  Fact Sheet for  Healthcare Providers:  IncredibleEmployment.be  This test is no t yet approved or cleared by the [place] FDA and  has been authorized for detection and/or diagnosis of SARS-CoV-2 by FDA under an Emergency Use Authorization (EUA). This EUA will remain  in effect (meaning this test can be used) for the duration of the COVID-19 declaration under Section 564(b)(1) of the Act, 21 U.S.C.section 360bbb-3(b)(1), unless the authorization is terminated  or revoked sooner.       Influenza A by PCR NEGATIVE NEGATIVE Final   Influenza B by PCR NEGATIVE NEGATIVE Final    Comment: (NOTE) The Xpert Xpress SARS-CoV-2/FLU/RSV plus assay is intended as an aid in the diagnosis of influenza from Nasopharyngeal swab specimens and should not be used as a sole basis for treatment. Nasal washings and aspirates are unacceptable for Xpert Xpress SARS-CoV-2/FLU/RSV testing.  Fact Sheet for Patients: EntrepreneurPulse.com.au  Fact Sheet for Healthcare Providers: IncredibleEmployment.be  This test is not yet approved or cleared by the [place] FDA and has been authorized for detection and/or diagnosis of SARS-CoV-2 by FDA under an Emergency Use Authorization (EUA). This EUA will remain in effect (meaning this test can be used) for the duration of the COVID-19 declaration under Section 564(b)(1) of the Act, 21 U.S.C. section 360bbb-3(b)(1), unless the authorization is terminated or revoked.  Performed at [facility], [place] [address] [place], [place] [address]     Lab Basic Metabolic Panel: Recent Labs  Lab 01/18/21 1245  NA 141  K 5.0  CL 113*  CO2 13*  GLUCOSE 143*  BUN 50*  CREATININE 2.80*  CALCIUM 7.2*   Liver Function Tests: Recent Labs  Lab 01-21-2021 1245  AST 352*  ALT 71*  ALKPHOS 86  BILITOT 2.2*  PROT 5.7*  ALBUMIN 1.4*   No results for input(s): LIPASE, AMYLASE in the last 168 hours. No  results for input(s): AMMONIA in the last 168 hours. CBC: Recent Labs  Lab 01-21-2021 1245  WBC 14.4*  NEUTROABS 13.1*  HGB 9.0*  HCT 25.6*  MCV 69.9*  PLT 150   Cardiac Enzymes: No results for input(s): CKTOTAL, CKMB, CKMBINDEX, TROPONINI in the last 168 hours. Sepsis Labs: Recent Labs  Lab 01-21-2021 1245 21-Jan-2021 1445  WBC 14.4*  --   LATICACIDVEN 8.9* >11.0*    Procedures/Operations  None   Advith Martine A Daylene Vandenbosch 01/13/2021, 6:58 AM

## 2021-02-01 DEATH — deceased
